# Patient Record
Sex: Female | Born: 1950 | Race: Black or African American | Hispanic: No | Marital: Single | Smoking: Former smoker
Health system: Southern US, Community
[De-identification: ages and names within clinical notes are randomized; demographics above are authoritative.]

## PROBLEM LIST (undated history)

## (undated) DIAGNOSIS — M545 Low back pain, unspecified: Secondary | ICD-10-CM

## (undated) DIAGNOSIS — D259 Leiomyoma of uterus, unspecified: Secondary | ICD-10-CM

## (undated) DIAGNOSIS — C801 Malignant (primary) neoplasm, unspecified: Secondary | ICD-10-CM

## (undated) DIAGNOSIS — K219 Gastro-esophageal reflux disease without esophagitis: Secondary | ICD-10-CM

## (undated) DIAGNOSIS — T7840XA Allergy, unspecified, initial encounter: Secondary | ICD-10-CM

## (undated) DIAGNOSIS — E785 Hyperlipidemia, unspecified: Secondary | ICD-10-CM

## (undated) DIAGNOSIS — K760 Fatty (change of) liver, not elsewhere classified: Secondary | ICD-10-CM

## (undated) DIAGNOSIS — H269 Unspecified cataract: Secondary | ICD-10-CM

## (undated) DIAGNOSIS — K7460 Unspecified cirrhosis of liver: Secondary | ICD-10-CM

## (undated) DIAGNOSIS — M1990 Unspecified osteoarthritis, unspecified site: Secondary | ICD-10-CM

## (undated) DIAGNOSIS — E669 Obesity, unspecified: Secondary | ICD-10-CM

## (undated) DIAGNOSIS — R609 Edema, unspecified: Secondary | ICD-10-CM

## (undated) DIAGNOSIS — H409 Unspecified glaucoma: Secondary | ICD-10-CM

## (undated) DIAGNOSIS — R911 Solitary pulmonary nodule: Secondary | ICD-10-CM

## (undated) DIAGNOSIS — Z889 Allergy status to unspecified drugs, medicaments and biological substances status: Secondary | ICD-10-CM

## (undated) DIAGNOSIS — Z973 Presence of spectacles and contact lenses: Secondary | ICD-10-CM

## (undated) DIAGNOSIS — C229 Malignant neoplasm of liver, not specified as primary or secondary: Secondary | ICD-10-CM

## (undated) DIAGNOSIS — B1920 Unspecified viral hepatitis C without hepatic coma: Secondary | ICD-10-CM

## (undated) DIAGNOSIS — K5790 Diverticulosis of intestine, part unspecified, without perforation or abscess without bleeding: Secondary | ICD-10-CM

## (undated) DIAGNOSIS — R7309 Other abnormal glucose: Secondary | ICD-10-CM

## (undated) DIAGNOSIS — I10 Essential (primary) hypertension: Secondary | ICD-10-CM

## (undated) DIAGNOSIS — D649 Anemia, unspecified: Secondary | ICD-10-CM

## (undated) HISTORY — DX: Unspecified glaucoma: H40.9

## (undated) HISTORY — DX: Unspecified osteoarthritis, unspecified site: M19.90

## (undated) HISTORY — DX: Other abnormal glucose: R73.09

## (undated) HISTORY — DX: Unspecified cataract: H26.9

## (undated) HISTORY — DX: Essential (primary) hypertension: I10

## (undated) HISTORY — DX: Obesity, unspecified: E66.9

## (undated) HISTORY — DX: Allergy, unspecified, initial encounter: T78.40XA

## (undated) HISTORY — PX: JOINT REPLACEMENT: SHX530

## (undated) HISTORY — DX: Unspecified viral hepatitis C without hepatic coma: B19.20

## (undated) HISTORY — PX: CHOLECYSTECTOMY: SHX55

## (undated) HISTORY — DX: Hyperlipidemia, unspecified: E78.5

## (undated) HISTORY — PX: TUBAL LIGATION: SHX77

## (undated) HISTORY — DX: Gastro-esophageal reflux disease without esophagitis: K21.9

## (undated) HISTORY — DX: Low back pain, unspecified: M54.50

## (undated) HISTORY — DX: Low back pain: M54.5

---
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
## 1998-10-17 ENCOUNTER — Ambulatory Visit (HOSPITAL_COMMUNITY): Admission: RE | Admit: 1998-10-17 | Discharge: 1998-10-17 | Payer: Self-pay | Admitting: Gastroenterology

## 1998-10-25 ENCOUNTER — Ambulatory Visit (HOSPITAL_COMMUNITY): Admission: RE | Admit: 1998-10-25 | Discharge: 1998-10-25 | Payer: Self-pay | Admitting: Gastroenterology

## 1998-10-25 ENCOUNTER — Encounter: Payer: Self-pay | Admitting: Gastroenterology

## 1998-11-13 ENCOUNTER — Ambulatory Visit (HOSPITAL_COMMUNITY): Admission: RE | Admit: 1998-11-13 | Discharge: 1998-11-13 | Payer: Self-pay | Admitting: Gastroenterology

## 1998-11-13 ENCOUNTER — Encounter: Payer: Self-pay | Admitting: Gastroenterology

## 2000-09-08 ENCOUNTER — Other Ambulatory Visit: Admission: RE | Admit: 2000-09-08 | Discharge: 2000-09-08 | Payer: Self-pay | Admitting: Internal Medicine

## 2003-09-25 ENCOUNTER — Encounter: Payer: Self-pay | Admitting: Orthopedic Surgery

## 2003-09-27 ENCOUNTER — Inpatient Hospital Stay (HOSPITAL_COMMUNITY): Admission: RE | Admit: 2003-09-27 | Discharge: 2003-09-29 | Payer: Self-pay | Admitting: Orthopedic Surgery

## 2003-10-20 ENCOUNTER — Other Ambulatory Visit: Admission: RE | Admit: 2003-10-20 | Discharge: 2003-10-20 | Payer: Self-pay | Admitting: Internal Medicine

## 2005-03-04 ENCOUNTER — Ambulatory Visit: Payer: Self-pay | Admitting: Internal Medicine

## 2005-03-07 ENCOUNTER — Ambulatory Visit: Payer: Self-pay | Admitting: Internal Medicine

## 2005-03-26 ENCOUNTER — Ambulatory Visit: Payer: Self-pay | Admitting: Internal Medicine

## 2005-12-29 HISTORY — PX: OTHER SURGICAL HISTORY: SHX169

## 2006-02-25 ENCOUNTER — Inpatient Hospital Stay (HOSPITAL_COMMUNITY): Admission: RE | Admit: 2006-02-25 | Discharge: 2006-03-01 | Payer: Self-pay | Admitting: Orthopedic Surgery

## 2006-05-05 ENCOUNTER — Ambulatory Visit: Payer: Self-pay | Admitting: Internal Medicine

## 2006-06-03 ENCOUNTER — Ambulatory Visit: Payer: Self-pay | Admitting: Internal Medicine

## 2007-03-29 ENCOUNTER — Ambulatory Visit: Payer: Self-pay | Admitting: Internal Medicine

## 2007-04-07 ENCOUNTER — Encounter: Admission: RE | Admit: 2007-04-07 | Discharge: 2007-04-07 | Payer: Self-pay | Admitting: Internal Medicine

## 2007-04-29 ENCOUNTER — Ambulatory Visit: Payer: Self-pay | Admitting: Internal Medicine

## 2007-04-29 LAB — CONVERTED CEMR LAB: pH: 6 (ref 5.0–8.0)

## 2007-09-27 ENCOUNTER — Other Ambulatory Visit: Admission: RE | Admit: 2007-09-27 | Discharge: 2007-09-27 | Payer: Self-pay | Admitting: Obstetrics and Gynecology

## 2007-09-28 ENCOUNTER — Encounter: Admission: RE | Admit: 2007-09-28 | Discharge: 2007-09-28 | Payer: Self-pay | Admitting: Obstetrics and Gynecology

## 2008-03-13 ENCOUNTER — Inpatient Hospital Stay (HOSPITAL_COMMUNITY): Admission: RE | Admit: 2008-03-13 | Discharge: 2008-03-17 | Payer: Self-pay | Admitting: Orthopedic Surgery

## 2008-03-13 HISTORY — PX: OTHER SURGICAL HISTORY: SHX169

## 2008-03-30 ENCOUNTER — Telehealth: Payer: Self-pay | Admitting: Internal Medicine

## 2008-03-31 ENCOUNTER — Encounter: Payer: Self-pay | Admitting: Internal Medicine

## 2008-03-31 ENCOUNTER — Ambulatory Visit: Payer: Self-pay | Admitting: Internal Medicine

## 2008-03-31 ENCOUNTER — Ambulatory Visit: Payer: Self-pay

## 2008-03-31 ENCOUNTER — Telehealth: Payer: Self-pay | Admitting: Internal Medicine

## 2008-03-31 DIAGNOSIS — R252 Cramp and spasm: Secondary | ICD-10-CM | POA: Insufficient documentation

## 2008-03-31 DIAGNOSIS — R350 Frequency of micturition: Secondary | ICD-10-CM

## 2008-03-31 DIAGNOSIS — I10 Essential (primary) hypertension: Secondary | ICD-10-CM

## 2008-03-31 LAB — CONVERTED CEMR LAB
Calcium: 9.6 mg/dL (ref 8.4–10.5)
Creatinine, Ser: 0.9 mg/dL (ref 0.4–1.2)
Crystals: NEGATIVE
GFR calc Af Amer: 83 mL/min
GFR calc non Af Amer: 68 mL/min
Magnesium: 2 mg/dL (ref 1.5–2.5)
Nitrite: NEGATIVE
Sodium: 139 meq/L (ref 135–145)
Urobilinogen, UA: 1 (ref 0.0–1.0)

## 2008-04-04 ENCOUNTER — Ambulatory Visit: Payer: Self-pay | Admitting: Internal Medicine

## 2008-04-04 DIAGNOSIS — I8390 Asymptomatic varicose veins of unspecified lower extremity: Secondary | ICD-10-CM

## 2008-04-17 ENCOUNTER — Encounter: Payer: Self-pay | Admitting: Internal Medicine

## 2008-05-04 ENCOUNTER — Ambulatory Visit: Payer: Self-pay | Admitting: Internal Medicine

## 2008-05-04 DIAGNOSIS — R739 Hyperglycemia, unspecified: Secondary | ICD-10-CM

## 2008-05-04 DIAGNOSIS — M25569 Pain in unspecified knee: Secondary | ICD-10-CM

## 2008-07-14 ENCOUNTER — Ambulatory Visit: Payer: Self-pay | Admitting: Internal Medicine

## 2008-07-14 DIAGNOSIS — D485 Neoplasm of uncertain behavior of skin: Secondary | ICD-10-CM

## 2008-09-06 ENCOUNTER — Ambulatory Visit: Payer: Self-pay | Admitting: Internal Medicine

## 2008-09-06 LAB — CONVERTED CEMR LAB
Calcium: 8.9 mg/dL (ref 8.4–10.5)
Creatinine, Ser: 0.8 mg/dL (ref 0.4–1.2)
GFR calc Af Amer: 95 mL/min
Hgb A1c MFr Bld: 5.8 % (ref 4.6–6.0)
Sodium: 144 meq/L (ref 135–145)

## 2008-09-13 ENCOUNTER — Ambulatory Visit: Payer: Self-pay | Admitting: Internal Medicine

## 2008-09-28 ENCOUNTER — Ambulatory Visit: Payer: Self-pay | Admitting: Internal Medicine

## 2008-09-28 DIAGNOSIS — J019 Acute sinusitis, unspecified: Secondary | ICD-10-CM

## 2008-11-14 ENCOUNTER — Ambulatory Visit: Payer: Self-pay | Admitting: Internal Medicine

## 2008-11-14 DIAGNOSIS — K219 Gastro-esophageal reflux disease without esophagitis: Secondary | ICD-10-CM

## 2008-11-14 DIAGNOSIS — M545 Low back pain: Secondary | ICD-10-CM | POA: Insufficient documentation

## 2008-11-15 LAB — CONVERTED CEMR LAB
Bilirubin Urine: NEGATIVE
Hemoglobin, Urine: NEGATIVE
Ketones, ur: NEGATIVE mg/dL
Mucus, UA: NEGATIVE
Nitrite: NEGATIVE

## 2009-03-05 ENCOUNTER — Ambulatory Visit: Payer: Self-pay | Admitting: Internal Medicine

## 2009-03-06 LAB — CONVERTED CEMR LAB
Basophils Absolute: 0 10*3/uL (ref 0.0–0.1)
Basophils Relative: 0 % (ref 0.0–3.0)
Bilirubin Urine: NEGATIVE
CO2: 28 meq/L (ref 19–32)
Calcium: 9.4 mg/dL (ref 8.4–10.5)
Creatinine, Ser: 0.8 mg/dL (ref 0.4–1.2)
Crystals: NEGATIVE
Eosinophils Absolute: 0.1 10*3/uL (ref 0.0–0.7)
GFR calc Af Amer: 94 mL/min
HCT: 38.7 % (ref 36.0–46.0)
Hemoglobin: 12.6 g/dL (ref 12.0–15.0)
Hgb A1c MFr Bld: 5.9 % (ref 4.6–6.0)
Ketones, ur: NEGATIVE mg/dL
MCHC: 32.5 g/dL (ref 30.0–36.0)
MCV: 82.3 fL (ref 78.0–100.0)
Monocytes Absolute: 0.4 10*3/uL (ref 0.1–1.0)
Neutro Abs: 2.7 10*3/uL (ref 1.4–7.7)
RBC: 4.7 M/uL (ref 3.87–5.11)
RDW: 14 % (ref 11.5–14.6)
Sodium: 142 meq/L (ref 135–145)
Specific Gravity, Urine: >=1.03 (ref 1.000–1.035)
Urine Glucose: NEGATIVE mg/dL
pH: 5.5 (ref 5.0–8.0)

## 2009-08-07 ENCOUNTER — Ambulatory Visit: Payer: Self-pay | Admitting: Internal Medicine

## 2009-08-07 DIAGNOSIS — M1990 Unspecified osteoarthritis, unspecified site: Secondary | ICD-10-CM | POA: Insufficient documentation

## 2009-12-29 DIAGNOSIS — E785 Hyperlipidemia, unspecified: Secondary | ICD-10-CM

## 2009-12-29 DIAGNOSIS — D649 Anemia, unspecified: Secondary | ICD-10-CM | POA: Diagnosis present

## 2009-12-29 HISTORY — DX: Hyperlipidemia, unspecified: E78.5

## 2010-09-01 ENCOUNTER — Emergency Department (HOSPITAL_COMMUNITY): Admission: EM | Admit: 2010-09-01 | Discharge: 2010-09-01 | Payer: Self-pay | Admitting: Emergency Medicine

## 2010-09-20 ENCOUNTER — Encounter: Payer: Self-pay | Admitting: Internal Medicine

## 2010-09-20 ENCOUNTER — Ambulatory Visit: Payer: Self-pay | Admitting: Internal Medicine

## 2010-09-20 DIAGNOSIS — B079 Viral wart, unspecified: Secondary | ICD-10-CM | POA: Insufficient documentation

## 2010-09-23 ENCOUNTER — Telehealth: Payer: Self-pay | Admitting: Internal Medicine

## 2010-09-23 DIAGNOSIS — R740 Nonspecific elevation of levels of transaminase and lactic acid dehydrogenase [LDH]: Secondary | ICD-10-CM

## 2010-09-23 LAB — CONVERTED CEMR LAB
Albumin: 3.9 g/dL (ref 3.5–5.2)
BUN: 13 mg/dL (ref 6–23)
Basophils Absolute: 0.1 10*3/uL (ref 0.0–0.1)
Bilirubin Urine: NEGATIVE
CO2: 26 meq/L (ref 19–32)
Calcium: 9.3 mg/dL (ref 8.4–10.5)
Cholesterol: 154 mg/dL (ref 0–200)
Eosinophils Absolute: 0.2 10*3/uL (ref 0.0–0.7)
Glucose, Bld: 90 mg/dL (ref 70–99)
HCT: 39.9 % (ref 36.0–46.0)
HDL: 44.7 mg/dL (ref >39.00)
Hemoglobin: 13.3 g/dL (ref 12.0–15.0)
Ketones, ur: NEGATIVE mg/dL
Lymphocytes Relative: 30.3 % (ref 12.0–46.0)
Lymphs Abs: 1.7 10*3/uL (ref 0.7–4.0)
MCHC: 33.4 g/dL (ref 30.0–36.0)
Neutro Abs: 3.2 10*3/uL (ref 1.4–7.7)
Nitrite: NEGATIVE
Platelets: 172 10*3/uL (ref 150.0–400.0)
RDW: 13.9 % (ref 11.5–14.6)
Sodium: 138 meq/L (ref 135–145)
Total Protein, Urine: NEGATIVE mg/dL
Urine Glucose: NEGATIVE mg/dL
VLDL: 19.4 mg/dL (ref 0.0–40.0)
pH: 6 (ref 5.0–8.0)

## 2010-10-23 ENCOUNTER — Ambulatory Visit: Payer: Self-pay | Admitting: Internal Medicine

## 2010-10-23 DIAGNOSIS — R05 Cough: Secondary | ICD-10-CM

## 2010-10-29 ENCOUNTER — Encounter: Admission: RE | Admit: 2010-10-29 | Discharge: 2010-10-29 | Payer: Self-pay | Admitting: Internal Medicine

## 2011-01-20 ENCOUNTER — Ambulatory Visit
Admission: RE | Admit: 2011-01-20 | Discharge: 2011-01-20 | Payer: Self-pay | Source: Home / Self Care | Attending: Internal Medicine | Admitting: Internal Medicine

## 2011-01-20 ENCOUNTER — Other Ambulatory Visit: Payer: Self-pay | Admitting: Internal Medicine

## 2011-01-20 LAB — HEPATIC FUNCTION PANEL
ALT: 61 U/L — ABNORMAL HIGH (ref 0–35)
AST: 63 U/L — ABNORMAL HIGH (ref 0–37)
Albumin: 3.7 g/dL (ref 3.5–5.2)
Alkaline Phosphatase: 89 U/L (ref 39–117)
Bilirubin, Direct: 0.1 mg/dL (ref 0.0–0.3)
Total Bilirubin: 0.5 mg/dL (ref 0.3–1.2)
Total Protein: 7.3 g/dL (ref 6.0–8.3)

## 2011-01-20 LAB — BASIC METABOLIC PANEL
Calcium: 9.3 mg/dL (ref 8.4–10.5)
GFR: 93.69 mL/min (ref >60.00)
Sodium: 140 mEq/L (ref 135–145)

## 2011-01-21 ENCOUNTER — Ambulatory Visit
Admission: RE | Admit: 2011-01-21 | Discharge: 2011-01-21 | Payer: Self-pay | Source: Home / Self Care | Attending: Internal Medicine | Admitting: Internal Medicine

## 2011-01-28 NOTE — Progress Notes (Signed)
  Phone Note Other Incoming   Summary of Call: per AVP: see append on labs Initial call taken by: Lanier Prude, [facility]),  September 23, 2010 11:59 AM  New Problems: NONSPEC ELEVATION OF LEVELS OF TRANSAMINASE/LDH (ICD-790.4)   New Problems: NONSPEC ELEVATION OF LEVELS OF TRANSAMINASE/LDH (ICD-790.4)

## 2011-01-28 NOTE — Assessment & Plan Note (Signed)
Summary: FU--MED CHANGE---STC   Vital Signs:  Patient profile:   60 year old female Height:      63 inches Weight:      283 pounds BMI:     50.31 Temp:     98.4 degrees F oral Pulse rate:   76 / minute Pulse rhythm:   regular Resp:     16 per minute BP sitting:   118 / 80  (left arm) Cuff size:   large  Vitals Entered By: Lanier Prude, Beverly Gust) (October 23, 2010 4:38 PM) CC: dry cough since starting Amlodipine and Benazepril Is Patient Diabetic? No   CC:  dry cough since starting Amlodipine and Benazepril.  History of Present Illness: C/o cough x weeks, dry F/u elev LFTs  Current Medications (verified): 1)  Lasix 20 Mg  Tabs (Furosemide) .... Take 1 Tablet By Mouth Once A Day 2)  Klor-Con M20 20 Meq  Tbcr (Potassium Chloride Crys Cr) .... Take 1 Tablet By Mouth Once A Day 3)  Vitamin D3 1000 Unit  Tabs (Cholecalciferol) .Marland Kitchen.. 1 By Mouth Daily 4)  Aspirin 81 Mg  Tbec (Aspirin) .... One By Mouth Every Day 5)  Ranitidine Hcl 150 Mg Caps (Ranitidine Hcl) .Marland Kitchen.. 1 Po Two Times A Day Prn 6)  Fluticasone Propionate 50 Mcg/act  Susp (Fluticasone Propionate) .... 2 Sprays Each Nostril Once Daily 7)  Amlodipine Besylate 5 Mg Tabs (Amlodipine Besylate) .Marland Kitchen.. 1 By Mouth Qd 8)  Benazepril Hcl 40 Mg Tabs (Benazepril Hcl) .Marland Kitchen.. 1 By Mouth Once Daily  Allergies (verified): 1)  Benazepril Hcl (Benazepril Hcl)  Past History:  Past Medical History: Hepatitis C Hypertension Elev glu Low back pain Obesity GERD Osteoarthritis B knees Elev LFTs 2011  Review of Systems  The patient denies fever.    Physical Exam  General:  overweight-appearing.   Mouth:  Oral mucosa and oropharynx without lesions or exudates.  Teeth in good repair. Lungs:  Normal respiratory effort, chest expands symmetrically. Lungs are clear to auscultation, no crackles or wheezes. Heart:  Normal rate and regular rhythm. S1 and S2 normal without gallop, murmur, click, rub or other extra sounds. Abdomen:  Bowel  sounds positive,abdomen soft and non-tender without masses, organomegaly or hernias noted. Extremities:  No clubbing, cyanosis, edema, or deformity noted with normal full range of motion of all joints.     Impression & Recommendations:  Problem # 1:  COUGH (ICD-786.2) due to ACE inh Assessment New D/c Benazepril  Problem # 2:  HYPERTENSION (ICD-401.9) Assessment: Improved  The following medications were removed from the medication list:    Benazepril Hcl 40 Mg Tabs (Benazepril hcl) .Marland Kitchen... 1 by mouth once daily Her updated medication list for this problem includes:    Lasix 20 Mg Tabs (Furosemide) .Marland Kitchen... Take 1 tablet by mouth once a day    Amlodipine Besylate 5 Mg Tabs (Amlodipine besylate) .Marland Kitchen... 1 by mouth qd    Cozaar 100 Mg Tabs (Losartan potassium) .Marland Kitchen... 1 by mouth qd  Problem # 3:  NONSPEC ELEVATION OF LEVELS OF TRANSAMINASE/LDH (ICD-790.4) Assessment: New Poss fatty liver Orders: Radiology Referral (Radiology)  Complete Medication List: 1)  Lasix 20 Mg Tabs (Furosemide) .... Take 1 tablet by mouth once a day 2)  Klor-con M20 20 Meq Tbcr (Potassium chloride crys cr) .... Take 1 tablet by mouth once a day 3)  Vitamin D3 1000 Unit Tabs (Cholecalciferol) .Marland Kitchen.. 1 by mouth daily 4)  Aspirin 81 Mg Tbec (Aspirin) .... One by mouth every day 5)  Ranitidine Hcl 150 Mg Caps (Ranitidine hcl) .Marland Kitchen.. 1 po two times a day prn 6)  Fluticasone Propionate 50 Mcg/act Susp (Fluticasone propionate) .... 2 sprays each nostril once daily 7)  Amlodipine Besylate 5 Mg Tabs (Amlodipine besylate) .Marland Kitchen.. 1 by mouth qd 8)  Cozaar 100 Mg Tabs (Losartan potassium) .Marland Kitchen.. 1 by mouth qd  Patient Instructions: 1)  Please schedule a follow-up appointment in 3 months. 2)  BMP prior to visit, ICD-9: 3)  Hepatic Panel prior to visit, ICD-9:401.1 995.20 Prescriptions: COZAAR 100 MG TABS (LOSARTAN POTASSIUM) 1 by mouth qd  #30 x 11   Entered and Authorized by:   Tresa Garter MD   Signed by:   Tresa Garter MD on 10/23/2010   Method used:   Print then Give to Patient   RxID:   [id number]    Orders Added: 1)  Radiology Referral [Radiology] 2)  Est. Patient Level IV [14782]   Immunization History:  Influenza Immunization History:    Influenza:  historical (08/31/2010)   Immunization History:  Influenza Immunization History:    Influenza:  Historical (08/31/2010)

## 2011-01-28 NOTE — Assessment & Plan Note (Signed)
Summary: yearly f/u / medicare/cd   Vital Signs:  Patient profile:   60 year old female Height:      63 inches Weight:      283 pounds BMI:     50.31 Temp:     99.2 degrees F oral Pulse rate:   76 / minute Pulse rhythm:   regular Resp:     16 per minute BP sitting:   118 / 78  (left arm) Cuff size:   large  Vitals Entered By: Lanier Prude, Beverly Gust) (September 20, 2010 9:42 AM) CC: MWV Is Patient Diabetic? No   CC:  MWV.  History of Present Illness: The patient presents for a preventive health examination  Patient past medical history, social history, and family history reviewed in detail no significant changes.  Patient is physically active. Depression is negative and mood is good. Hearing is normal, and able to perform activities of daily living. Risk of falling is negligible and home safety has been reviewed and is appropriate. Patient has normal height, overweight, and visual acuity. Patient has been counseled on age-appropriate routine health concerns for screening and prevention. Education, counseling done.  C/o skin growth C/o wt gain,  F/u HTN - Rx too $$$  Preventive Screening-Counseling & Management  Alcohol-Tobacco     Smoking Status: quit  Caffeine-Diet-Exercise     Does Patient Exercise: no  Current Medications (verified): 1)  Lasix 20 Mg  Tabs (Furosemide) .... Take 1 Tablet By Mouth Once A Day 2)  Klor-Con M20 20 Meq  Tbcr (Potassium Chloride Crys Cr) .... Take 1 Tablet By Mouth Once A Day 3)  Vitamin D3 1000 Unit  Tabs (Cholecalciferol) .Marland Kitchen.. 1 By Mouth Daily 4)  Azor 5-40 Mg  Tabs (Amlodipine-Olmesartan) .Marland Kitchen.. 1 By Mouth Daily 5)  Aspirin 81 Mg  Tbec (Aspirin) .... One By Mouth Every Day 6)  Ranitidine Hcl 150 Mg Caps (Ranitidine Hcl) .Marland Kitchen.. 1 Po Two Times A Day Prn 7)  Fluticasone Propionate 50 Mcg/act  Susp (Fluticasone Propionate) .... 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 Single Occupation: CNA on  a disability Former Smoker 2009 Alcohol use-no Regular exercise-no Smoking Status:  quit Does Patient Exercise:  no  Review of Systems  The patient denies anorexia, fever, weight loss, weight gain, vision loss, decreased hearing, hoarseness, chest pain, syncope, dyspnea on exertion, peripheral edema, prolonged cough, headaches, hemoptysis, abdominal pain, melena, hematochezia, severe indigestion/heartburn, hematuria, incontinence, genital sores, muscle weakness, suspicious skin lesions, transient blindness, difficulty walking, depression, unusual weight change, abnormal bleeding, enlarged lymph nodes, angioedema, and breast masses.    Physical Exam  General:  overweight-appearing.   Head:  Normocephalic and atraumatic without obvious abnormalities. No apparent alopecia or balding. Eyes:  No corneal or conjunctival inflammation noted. EOMI. Perrla.  Ears:  External ear exam shows no significant lesions or deformities.  Otoscopic examination reveals clear canals, tympanic membranes are intact bilaterally without bulging, retraction, inflammation or discharge. Hearing is grossly normal bilaterally. Nose:  External nasal examination shows no deformity or inflammation. Nasal mucosa are pink and moist without lesions or exudates. Mouth:  Oral mucosa and oropharynx without lesions or exudates.  Teeth in good repair. Neck:  No deformities, masses, or tenderness noted. Lungs:  Normal respiratory effort, chest expands symmetrically. Lungs are clear  to auscultation, no crackles or wheezes. Heart:  Normal rate and regular rhythm. S1 and S2 normal without gallop, murmur, click, rub or other extra sounds. Abdomen:  Bowel sounds positive,abdomen soft and non-tender without masses, organomegaly or hernias noted. Msk:  No deformity or scoliosis noted of thoracic or lumbar spine.   Extremities:  No clubbing, cyanosis, edema, or deformity noted with normal full range of motion of all joints.   Neurologic:  No cranial nerve deficits noted. Station and gait are normal. Plantar reflexes are down-going bilaterally. DTRs are symmetrical throughout. Sensory, motor and coordinative functions appear intact. Skin:  warts on skin Cervical Nodes:  No lymphadenopathy noted Inguinal Nodes:  No significant adenopathy Psych:  Cognition and judgment appear intact. Alert and cooperative with normal attention span and concentration. No apparent delusions, illusions, hallucinations   Impression & Recommendations:  Problem # 1:  HEALTH MAINTENANCE EXAM (ICD-V70.0) Assessment New Overall doing well, age appropriate education and counseling updated and referral for appropriate preventive services done unless declined, immunizations up to date or declined, diet counseling done if overweight, urged to quit smoking if smokes, most recent labs reviewed and current ordered if appropriate, ecg reviewed or declined (interpretation per ECG scanned in the EMR if done); information regarding Medicare Preventation requirements given if appropriate.  Gyn appt/mammo is pending   Orders: TLB-BMP (Basic Metabolic Panel-BMET) (80048-METABOL) TLB-CBC Platelet - w/Differential (85025-CBCD) TLB-Hepatic/Liver Function Pnl (80076-HEPATIC) TLB-Lipid Panel (80061-LIPID) TLB-TSH (Thyroid Stimulating Hormone) (84443-TSH) TLB-Udip ONLY (81003-UDIP) EKG w/ Interpretation (93000) Medicare -1st Annual Wellness Visit [phone])  Problem # 2:  WART, VIRAL (ICD-078.10) face Assessment:  New Procedure: cryo Indication: wart(s) Risks incl. scar(s), incomplete removal, ect.  and benefits discussed     5 lesion(s) on skin was/were treated with liqid N2 in usual fasion.  Tolerated well. Compl. none. Wound care instructions given.   Problem # 3:  LOW BACK PAIN (ICD-724.2) Assessment: Unchanged  Her updated medication list for this problem includes:    Aspirin 81 Mg Tbec (Aspirin) ..... One by mouth every day  Complete Medication List: 1)  Lasix 20 Mg Tabs (Furosemide) .... Take 1 tablet by mouth once a day 2)  Klor-con M20 20 Meq Tbcr (Potassium chloride crys cr) .... Take 1 tablet by mouth once a day 3)  Vitamin D3 1000 Unit Tabs (Cholecalciferol) .Marland Kitchen.. 1 by mouth daily 4)  Aspirin 81 Mg Tbec (Aspirin) .... One by mouth every day 5)  Ranitidine Hcl 150 Mg Caps (Ranitidine hcl) .Marland Kitchen.. 1 po two times a day prn 6)  Fluticasone Propionate 50 Mcg/act Susp (Fluticasone propionate) .... 2 sprays each nostril once daily 7)  Amlodipine Besylate 5 Mg Tabs (Amlodipine besylate) .Marland Kitchen.. 1 by mouth qd 8)  Benazepril Hcl 40 Mg Tabs (Benazepril hcl) .Marland Kitchen.. 1 by mouth once daily  Other Orders: Wart Destruct <14 (17110)  Patient Instructions: 1)  Please schedule a follow-up appointment in 4 months. Prescriptions: FLUTICASONE PROPIONATE 50 MCG/ACT  SUSP (FLUTICASONE PROPIONATE) 2 sprays each nostril once daily  #1 vial x 3   Entered and Authorized by:   Tresa Garter MD   Signed by:   Tresa Garter MD on 09/20/2010   Method used:   Print then Give to Patient   RxID:   [id number] RANITIDINE HCL 150 MG CAPS (RANITIDINE HCL) 1  po two times a day prn  #60 x 12   Entered and Authorized by:   Tresa Garter MD   Signed by:   Tresa Garter MD on 09/20/2010   Method used:   Print then Give to Patient   RxID:   [id number] VITAMIN D3 1000 UNIT  TABS (CHOLECALCIFEROL) 1 by mouth daily  #100 x 3   Entered and Authorized by:   Tresa Garter MD   Signed by:    Tresa Garter MD on 09/20/2010   Method used:   Print then Give to Patient   RxID:   [id number] KLOR-CON M20 20 MEQ  TBCR (POTASSIUM CHLORIDE CRYS CR) Take 1 tablet by mouth once a day  #30 Each x 12   Entered and Authorized by:   Tresa Garter MD   Signed by:   Tresa Garter MD on 09/20/2010   Method used:   Print then Give to Patient   RxID:   [id number] LASIX 20 MG  TABS (FUROSEMIDE) Take 1 tablet by mouth once a day  #30 Each x 12   Entered and Authorized by:   Tresa Garter MD   Signed by:   Tresa Garter MD on 09/20/2010   Method used:   Print then Give to Patient   RxID:   [id number] BENAZEPRIL HCL 40 MG TABS (BENAZEPRIL HCL) 1 by mouth once daily  #30 x 12   Entered and Authorized by:   Tresa Garter MD   Signed by:   Tresa Garter MD on 09/20/2010   Method used:   Print then Give to Patient   RxID:   [id number] AMLODIPINE BESYLATE 5 MG TABS (AMLODIPINE BESYLATE) 1 by mouth qd  #30 x 12   Entered and Authorized by:   Tresa Garter MD   Signed by:   Tresa Garter MD on 09/20/2010   Method used:   Print then Give to Patient   RxID:   [id number]

## 2011-01-30 NOTE — Assessment & Plan Note (Signed)
Summary: 4 MO ROV /NWS  #   Vital Signs:  Patient profile:   60 year old female Height:      63 inches Weight:      282 pounds BMI:     50.13 Temp:     98.6 degrees F oral Pulse rate:   84 / minute Pulse rhythm:   regular Resp:     16 per minute BP sitting:   120 / 86  (left arm) Cuff size:   large  Vitals Entered By: Lanier Prude, CMA(AAMA) (January 21, 2011 8:10 AM) CC: 4 mo f/u  Is Patient Diabetic? No   CC:  4 mo f/u .  History of Present Illness: F/u HTN, cough, elev. LFTs  Current Medications (verified): 1)  Lasix 20 Mg  Tabs (Furosemide) .... Take 1 Tablet By Mouth Once A Day 2)  Klor-Con M20 20 Meq  Tbcr (Potassium Chloride Crys Cr) .... Take 1 Tablet By Mouth Once A Day 3)  Vitamin D3 1000 Unit  Tabs (Cholecalciferol) .Marland Kitchen.. 1 By Mouth Daily 4)  Aspirin 81 Mg  Tbec (Aspirin) .... One By Mouth Every Day 5)  Ranitidine Hcl 150 Mg Caps (Ranitidine Hcl) .Marland Kitchen.. 1 Po Two Times A Day Prn 6)  Fluticasone Propionate 50 Mcg/act  Susp (Fluticasone Propionate) .... 2 Sprays Each Nostril Once Daily 7)  Amlodipine Besylate 5 Mg Tabs (Amlodipine Besylate) .Marland Kitchen.. 1 By Mouth Qd 8)  Cozaar 100 Mg Tabs (Losartan Potassium) .Marland Kitchen.. 1 By Mouth Qd  Allergies (verified): 1)  Benazepril Hcl (Benazepril Hcl)  Past History:  Past Medical History: Last updated: 10/23/2010 Hepatitis C Hypertension Elev glu Low back pain Obesity GERD Osteoarthritis B knees Elev LFTs 2011  Social History: Last updated: 09/20/2010 Single Occupation: CNA on  a disability Former Smoker 2009 Alcohol use-no Regular exercise-no  Review of Systems  The patient denies fever, weight loss, weight gain, chest pain, syncope, and abdominal pain.    Physical Exam  General:  overweight-appearing.   Head:  Normocephalic and atraumatic without obvious abnormalities. No apparent alopecia or balding. Nose:  External nasal examination shows no deformity or inflammation. Nasal mucosa are pink and moist without  lesions or exudates. Mouth:  Oral mucosa and oropharynx without lesions or exudates.  Teeth in good repair. Neck:  No deformities, masses, or tenderness noted. Lungs:  Normal respiratory effort, chest expands symmetrically. Lungs are clear to auscultation, no crackles or wheezes. Heart:  Normal rate and regular rhythm. S1 and S2 normal without gallop, murmur, click, rub or other extra sounds. Abdomen:  Bowel sounds positive,abdomen soft and non-tender without masses, organomegaly or hernias noted. Msk:  No deformity or scoliosis noted of thoracic or lumbar spine.   Extremities:  No clubbing, cyanosis, edema, or deformity noted with normal full range of motion of all joints.   Neurologic:  No cranial nerve deficits noted. Station and gait are normal. Plantar reflexes are down-going bilaterally. DTRs are symmetrical throughout. Sensory, motor and coordinative functions appear intact. Skin:  warts on skin Psych:  Cognition and judgment appear intact. Alert and cooperative with normal attention span and concentration. No apparent delusions, illusions, hallucinations   Impression & Recommendations:  Problem # 1:  OSTEOARTHRITIS (ICD-715.90) Assessment Unchanged  Her updated medication list for this problem includes:    Aspirin 81 Mg Tbec (Aspirin) ..... One by mouth every day  Problem # 2:  NONSPEC ELEVATION OF LEVELS OF TRANSAMINASE/LDH (ICD-790.4) Assessment: Unchanged Fatty liver. Discussed The labs/US were reviewed with the patient.  Problem # 3:  GERD (ICD-530.81) Assessment: Unchanged  Her updated medication list for this problem includes:    Ranitidine Hcl 150 Mg Caps (Ranitidine hcl) .Marland Kitchen... 1 po two times a day prn  Problem # 4:  COUGH (ICD-786.2) resolved Assessment: Improved  Problem # 5:  OBESITY, MORBID (ICD-278.01) Assessment: Unchanged See "Patient Instructions".   Complete Medication List: 1)  Lasix 20 Mg Tabs (Furosemide) .... Take 1 tablet by mouth once a day 2)   Klor-con M20 20 Meq Tbcr (Potassium chloride crys cr) .... Take 1 tablet by mouth once a day 3)  Vitamin D3 1000 Unit Tabs (Cholecalciferol) .Marland Kitchen.. 1 by mouth daily 4)  Aspirin 81 Mg Tbec (Aspirin) .... One by mouth every day 5)  Ranitidine Hcl 150 Mg Caps (Ranitidine hcl) .Marland Kitchen.. 1 po two times a day prn 6)  Fluticasone Propionate 50 Mcg/act Susp (Fluticasone propionate) .... 2 sprays each nostril once daily 7)  Amlodipine Besylate 5 Mg Tabs (Amlodipine besylate) .Marland Kitchen.. 1 by mouth qd 8)  Cozaar 100 Mg Tabs (Losartan potassium) .Marland Kitchen.. 1 by mouth qd  Patient Instructions: 1)  Please schedule a follow-up appointment in 4 months well w/labs. 2)  It is important that you exercise regularly at least 20 minutes 5 times a week. If you develop chest pain, have severe difficulty breathing, or feel very tired , stop exercising immediately and seek medical attention. 3)  You need to lose weight. Consider a lower calorie diet and regular exercise.    Orders Added: 1)  Est. Patient Level IV [16109]

## 2011-05-13 NOTE — Op Note (Signed)
Samantha [name], Samantha [name]              ACCOUNT NO.:  [id number]   MEDICAL RECORD NO.:  [id number]          PATIENT TYPE:  INP   LOCATION:  2899                         FACILITY:  [facility]   PHYSICIAN:  Feliberto Gottron. Turner Daniels, M.D.   DATE OF BIRTH:  [birth year]   DATE OF PROCEDURE:  03/13/2008  DATE OF DISCHARGE:                               OPERATIVE REPORT   PREOPERATIVE DIAGNOSIS:  End-stage arthritis, left knee.   POSTOPERATIVE DIAGNOSIS:  End-stage arthritis, left knee.   PROCEDURE:  Left total knee arthroplasty using DePuy Sigma RP  components, 3 femur, 4 tibia, 35-mm patellar button and a 10=mm  posterior-stabilized rotating platform bearing.  All components cemented  with a double batch of DePuy HV cement with 1500 mg of Zinacef.   SURGEON:  Feliberto Gottron.  Turner Daniels, MD   FIRST ASSISTANT:  Laural Benes. Jannet Mantis., PA-C.   ANESTHETIC:  General endotracheal.   ESTIMATED BLOOD LOSS:  Minimal.   FLUID REPLACEMENT:  1500 mL crystalloid.   TOURNIQUET TIME:  1 hour 30 minutes.   DRAINS PLACED:  Two medium Hemovacs and a Foley catheter.   URINE OUTPUT:  300 mL.   INDICATIONS FOR PROCEDURE:  A 60 year old woman status post successful  right total knee in 2007 for end-stage arthritis now has same on the  left and has failed conservative treatment.  Desires elective left total  knee arthroplasty.  Well aware of the risks and benefits of surgery,  questions answered.   DESCRIPTION OF PROCEDURE:  The patient underwent left femoral nerve  block anesthetic in the block area at [facility], then taken to  operating room #15, where the appropriate anesthetic monitors were  attached and general endotracheal anesthesia induced.  She received 2 g  of Ancef preoperatively IV, lateral post and foot positioner applied to  the left side of the table, tourniquet applied high the left thigh,  Foley catheter was inserted and the left lower extremity prepped and  draped in the usual sterile fashion from the  ankle to the tourniquet.  The limb was wrapped with an Esmarch bandage, tourniquet inflated to 350  mmHg.  Time-out procedure was performed and we began the procedure by  making an anterior midline incision starting one handbreadth above the  patella, going over the patella 1 cm medial to and 2 cm distal to the  tibial tubercle.  Small bleeders in the skin and subcutaneous tissue  identified and cauterized.  Transverse retinaculum over the patella was  sectioned and reflected medially, allowing a medial parapatellar  arthrotomy.  Prepatellar fat pad was resected, the patella everted.  Superficial medial collateral ligament was elevated off the proximal  tibia from anterior to posterior, leaving it intact distally about 6 cm  down the metaphysis.  This allowed [place] to evert the patella, hyperflex  and externally rotate the tibia, exposing the cruciate ligaments that  were resected with the electrocautery.  Peripheral notch osteophytes  were removed with a half-inch osteotome.  A lateral Hohmann retractor  was then placed, a McCale retractor through the notch and a  posteromedial Z retractor.  We  then entered the proximal tibia with the  DePuy step drill just lateral to the center of the tibia about 1-2 mm  because of the configuration of her tibial shaft.  The 0-degree  posterior slope cutting guide was pinned into place, allowing [place] to  resect 7 mm of bone medially and 10 mm of bone laterally and some more  peripheral osteophytes.  Satisfied with the tibial resection, we then  entered the distal femur 2 mm anterior to the PCL origin, followed by  the IM rod and a 5-degrees left distal femoral cutting guide set at 11  mm.  The distal femoral cut was then accomplished and we measured for a  #3 tibia, pinning the guide in 3 degrees of external rotation and using  the posterior referenced guide.  With the chamfer guide hammered into  place and clipped, we then performed our anterior-posterior  and chamfer  cuts without difficulty, followed by the sigma RP box cut.  We checked  our extension and flexion gaps with the lollipop and found them to be  excellent.  The patella was then measured at 24 mm.  We guessed at a 35-  mm patellar button, set the cutting guide at 16 and resected the  posterior 8-9 mm of the patella, sized for a 35 button and drilled the  patella.  The knee was once again hyperflexed exposing the proximal  tibia, which was then sized for a #4 tibial baseplate, which was pinned  into place followed by the smokestack and the smokestack reamer.  The  delta fin keel cuts were then performed with the power saw and the delta  fin keel and bolt were hammered into place.  The 4 left femoral trial  was then hammered into place and the lugs drilled.  A 10-mm sigma RP  trial spacer was placed on the trials and a 35 trial button placed.  The  knee was then reduced, taken to full extension and then flexed to 130  degrees, limited by adipose tissue.  Satisfied with the ligamentous  stability, the trial components were then removed and all bony surfaces  were water-picked clean and dried with suction and sponges.  A double  batch of DePuy HV cement with 1500 mg of Zinacef was then mixed and  applied to all bony and metallic mating surfaces except for the  posterior condyles of the femur itself.  In order, we then hammered into  place a #4 tibial baseplate and removed excess cement, a #3 left femoral  component and removed excess cement, and a 35-mm patellar button was  squeezed into place and excess cement removed.  A 10-mm sigma RP bearing  was then placed in the proximal tibia and the knee reduced.  At this  point the knee was once again water-picked clean and the cement allowed  to harden.  Medium Hemovac drains were placed in the wound.  We checked  stability one more time and it was found to be excellent.  At this point  the parapatellar arthrotomy was closed with running  #1 Vicryl suture,  the subcutaneous tissue with 0 and 2-0 undyed Vicryl suture, and the  skin with skin staples.  A dressing of Xeroform, 4x4 dressing sponges,  Webril and Ace wrap applied.  Tourniquet let down.  The patient awakened  and taken to the recovery room without difficulty.      Feliberto Gottron. Turner Daniels, M.D.  Electronically Signed     FJR/MEDQ  D:  03/13/2008  T:  03/13/2008  Job:  [id number]

## 2011-05-15 ENCOUNTER — Other Ambulatory Visit: Payer: Self-pay

## 2011-05-15 ENCOUNTER — Other Ambulatory Visit: Payer: Self-pay | Admitting: Internal Medicine

## 2011-05-15 DIAGNOSIS — Z0000 Encounter for general adult medical examination without abnormal findings: Secondary | ICD-10-CM

## 2011-05-16 ENCOUNTER — Other Ambulatory Visit (INDEPENDENT_AMBULATORY_CARE_PROVIDER_SITE_OTHER): Payer: Self-pay | Admitting: Internal Medicine

## 2011-05-16 ENCOUNTER — Other Ambulatory Visit (INDEPENDENT_AMBULATORY_CARE_PROVIDER_SITE_OTHER): Payer: Self-pay

## 2011-05-16 DIAGNOSIS — Z0000 Encounter for general adult medical examination without abnormal findings: Secondary | ICD-10-CM

## 2011-05-16 LAB — LIPID PANEL
HDL: 53.2 mg/dL (ref >39.00)
Total CHOL/HDL Ratio: 3
Triglycerides: 70 mg/dL (ref 0.0–149.0)

## 2011-05-16 LAB — CBC WITH DIFFERENTIAL/PLATELET
Basophils Relative: 0.6 % (ref 0.0–3.0)
Eosinophils Absolute: 0.1 10*3/uL (ref 0.0–0.7)
Eosinophils Relative: 2.4 % (ref 0.0–5.0)
HCT: 39 % (ref 36.0–46.0)
Lymphs Abs: 1.7 10*3/uL (ref 0.7–4.0)
MCHC: 33.7 g/dL (ref 30.0–36.0)
MCV: 83.3 fl (ref 78.0–100.0)
Monocytes Absolute: 0.6 10*3/uL (ref 0.1–1.0)
Neutrophils Relative %: 52.9 % (ref 43.0–77.0)
RBC: 4.68 Mil/uL (ref 3.87–5.11)
WBC: 5.2 10*3/uL (ref 4.5–10.5)

## 2011-05-16 LAB — BASIC METABOLIC PANEL
CO2: 28 mEq/L (ref 19–32)
Calcium: 9.4 mg/dL (ref 8.4–10.5)
Chloride: 109 mEq/L (ref 96–112)
Potassium: 4.5 mEq/L (ref 3.5–5.1)
Sodium: 141 mEq/L (ref 135–145)

## 2011-05-16 LAB — URINALYSIS, ROUTINE W REFLEX MICROSCOPIC
Bilirubin Urine: NEGATIVE
Ketones, ur: NEGATIVE
Total Protein, Urine: NEGATIVE
Urine Glucose: NEGATIVE
Urobilinogen, UA: 0.2 (ref 0.0–1.0)

## 2011-05-16 LAB — HEPATIC FUNCTION PANEL
ALT: 89 U/L — ABNORMAL HIGH (ref 0–35)
Total Protein: 7.2 g/dL (ref 6.0–8.3)

## 2011-05-16 NOTE — Op Note (Signed)
NAME:  Samantha [name], Samantha [name]                        ACCOUNT NO.:  [id number]   MEDICAL RECORD NO.:  [id number]                   PATIENT TYPE:  INP   LOCATION:  5031                                 FACILITY:  [facility]   PHYSICIAN:  Feliberto Gottron. Turner Daniels, M.D.                DATE OF BIRTH:  1951-08-16   DATE OF PROCEDURE:  09/27/2003  DATE OF DISCHARGE:                                 OPERATIVE REPORT   PREOPERATIVE DIAGNOSIS:  Osteoarthritis of the left shoulder.   POSTOPERATIVE DIAGNOSIS:  Osteoarthritis of the left shoulder.   PROCEDURE:  Left shoulder hemiarthroplasty in a DuPuy global shoulder  system, a #8 stem and a 52 mm x 15 mm head.   SURGEON:  Feliberto Gottron. Turner Daniels, M.D.   FIRST ASSISTANT:  Erskine Squibb B. Jannet Mantis.   ANESTHESIA:  General endotracheal.   ESTIMATED BLOOD LOSS:  300 cc.   FLUID REPLACEMENT:  1500 cc Crystalloid.   DRAINS:  None.   TOURNIQUET TIME:  None.   INDICATIONS FOR PROCEDURE:  A 60 year old woman with end-stage arthritis of  both shoulders, left worse than right.  She has failed conservative  treatment of the left shoulder with anti-inflammatory medications, cortisone  injections, arthroscopic washout; has a large drop osteophyte and still has  very significant pain in her left shoulder.  Desires elective either total  shoulder arthroplasty or hemiarthroplasty, depending on the findings at  surgery.  A preoperative MRI scan was consistent with osteoarthritis and  partial tearing of the rotator cuff.   DESCRIPTION OF PROCEDURE:  The patient identified by armband, taken to the  OR at [facility]  The appropriate anesthetic  monitors were attached and general endotracheal anesthesia induced with the  patient in the supine position.  She was then placed in the  semi-Fowler beach chair position, and the left upper extremity prepped and  draped in the usual sterile fashion from the wrist to the hemithorax.   We began the procedure by making a  deltopectoral incision, starting a few  centimeters above the coracoid process and going distally down towards the  insertion of the deltoid muscle, for a length of about 15 cm.  Small  bleeders in the skin and subcutaneous tissue were identified and cauterized.  We identified the cephalic vein and retracted it laterally to exposing the  deltopectoral interval; which was then bluntly dissected down to the  deltopectoral fascia.  This was then incised along the line of the conjoined  tendon and the subscapularis, allowing placement of the Link  retractor in  the interval between the conjoined tendon and the subscapularis and the  deltoid muscle.   The patient had good external rotation to about 60 degrees.  Therefore, we  took down the subscapularis and capsule as one unit, and tagged it with four  #2 Ethibond sutures.  This exposed the arthritic humeral head, and after we  had  dissected the capsule down to the six o'clock postion we were able to  externally rotate the humeral head and dislocate it from the joint,  delivering it from the joint with the combination of the Link retractor and  a couple of Homan retractors.  We then found a point that was coaxial with  the humeral shaft, on the superior aspect of the junction of the humeral  head and the shaft, and entered this with the hand reamer from the DuPuy  global shoulder set.  We hand reamed at 6 mm and then hand reamed at 8 mm --  obtaining a good fit and fill at the 8 mm level.  The reamer was left in the  bone, and the proximally humeral head cutting guide was brought up and set  at 20 degrees of anteversion.  Then we resected a thickness of about 15-16  mm of the humeral head, after first removing the huge peripheral osteophytes  anterior, inferior and posterior.  We then sized to a 52 x 15 modular  humeral head, and brought up the body cutting chisel, which was hammered  into place and allowing removal of metaphyseal bone and  placement of the fin  cuts.   An A trial body was then hammered into place, with a 15 x 52 head and the  shoulder reduced.  Excellent stability noted to at least 60 of external  rotation and internally it could not be rotated and dislocated.  The glenoid  was carefully evaluated and found to have articular cartilage throughout,  although it had grade 2 chondromalacia and the labrum was intact.   At this point the trial components were removed.  A #8 body and stem was  then hammered into place, obtaining a good press fit.  A 15 x 52 modular  head was then hammered onto the stem; the shoulder once again reduced after  irrigating with normal saline solution.  Prior to placement of the body,  four drill holes with loops were placed in the anterior and inferior aspects  of the proximal humerus -- to allow repair of the subscapularis and capsule  back to bone.  This was accomplished using the #2 Ethibond sutures, and the  rotator interval was also closed with #2 Ethibond suture.   The shoulder was then taken through a range of motion and found to be very  stable.  We once again palpated the axillary nerve and confirmed that it was  intact.  No significant bleeding was encountered.  The wound was washed out  with normal saline solution.  We then closed the subcutaneous layer with #1  Vicryl suture, 2-0 Vicryl suture, and the skin was closed with running,  interlocking 3-0 nylon suture.  A dressing of Xeroform 4 x 4 dressing  sponges and Hypafix tape applied.  The patient was then placed in a sling,  awakened and taken to the recovery room without difficulty.                                               Feliberto Gottron. Turner Daniels, M.D.    Ovid Curd  D:  09/27/2003  T:  09/27/2003  Job:  [id number]

## 2011-05-16 NOTE — Discharge Summary (Signed)
NAMEHENNESSEY, CANTRELL              ACCOUNT NO.:  [id number]   MEDICAL RECORD NO.:  [id number]          PATIENT TYPE:  INP   LOCATION:  5020                         FACILITY:  [facility]   PHYSICIAN:  Feliberto Gottron. Turner Daniels, M.D.   DATE OF BIRTH:  1951/03/14   DATE OF ADMISSION:  03/13/2008  DATE OF DISCHARGE:  03/17/2008                               DISCHARGE SUMMARY   DIAGNOSIS:  End-stage degenerative joint disease of the left knee.   DISCHARGE SUMMARY:  The patient is a 60 year old woman who underwent  successful right total knee in 2007 for end-stage arthritis, now has the  same signs and symptoms on the left, has failed conservative treatment.  She desires elective left total knee arthroplasty.  She is well aware of  the risks and benefits, and wishes to proceed.   ALLERGIES:  She has no known drug allergies.   MEDICATIONS:  At the time of admission include Benicar, Lasix, EpiCor,  Klor-Con, and Vicodin.   PAST MEDICAL HISTORY:  __________ , history of hypertension, and DJD.   SURGICAL HISTORY:  Left shoulder replacement in 2006, right total knee  in 2007.   SOCIAL HISTORY:  No tobacco, positive ethanol, no IV drug abuse.  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   FAMILY HISTORY:  Mother died at age 14 with a history of CVA, CAD,  hypertension, and cancer.  Father died at the age of 73 with history of  diabetes, heart attack, and hypertension.   REVIEW OF SYSTEMS:  Positive for upper and lower partial dentures.  She  denies any shortness of breath, chest pain, or recent illness.   PHYSICAL EXAMINATION:  VITAL SIGNS:  Temperature 97.8, pulse 77,  respirations 16, blood pressure 157/101 and also re-examined 127/86.  The patient is a 5 feet 2 inches, 220-pound female.  HEAD:  Normocephalic, atraumatic.  EARS:  TMs are clear.  EYES:  Pupils are equal, round, and reactive to light and accommodation.  NOSE:  Patent.  THROAT:  Midline.  NECK:  Supple with full range of motion.  CHEST:  Clear to percussion and auscultation.  HEART:  Regular rate and rhythm.  ABDOMEN:  Soft and nontender.  Bowel sounds 2+.  EXTREMITIES:  Right knee range of motion 0-100 degrees.  Well-healed  __________  scar.  Left knee range of motion 5-100 degrees with 5  degrees of varus deformity.  Positive for crepitus with pain.  SKIN:  Within normal limits.   LABORATORY DATA:  X-rays show bone-on-bone changes of the left knee.  Right knee shows stable components.  Preoperative labs including CBC,  CMET, chest x-ray, EKG, PT, and PTT were all within normal limits  with  the exception of an AST of 75 and ALT of 70, as well as calcium of 8.3.   HOSPITAL COURSE:  On the day of admission, the patient was taken to the  operating room at [facility] where she underwent left total knee  arthroplasty using DePuy Sigma RP components, #3 femur, #4 tibia, 35 mm  patellar button, a 10 mm posterior stabilized rotating platform bearing  with all components cemented with double batch of DePuy HV cement using  1500 mg of Zinacef.  The patient was placed on perioperative  antibiotics.  She was placed on postoperative Coumadin prophylaxis with  bridging Lovenox therapy until she became therapeutic.  She was placed  on a PCA Dilaudid pump for pain control.  Physical therapy was begun in  the recovery area using a CPM.  On postoperative day 1, the patient was  awake and alert.  Pain was 8/10.  No nausea or vomiting.  She was using  incentive spirometer.  Vital signs were stable.  She was afebrile.  Hemoglobin 12.6, WBC of 7.6, and INR 1.0.  Urine output 500 mL.  Wound  was clean and dry.  Drain was discontinued without difficulty.  She  continued physical therapy.  On postoperative day 2, the patient was  complaining of minimal pain.  T-max 99.1.  Vital signs were stable.  Hemoglobin 9.7, and INR 1.1.  Dressing was dry, wound edges were benign.  Calf was soft and nontender.  Range of motion 5-45 degrees.   Physical  therapy was continued.  PCA and Foley were discontinued.  On  postoperative day 3, the patient was without complaint except fatigue.  She was afebrile.  Vital signs were stable.  She was alert and oriented.  Hemoglobin 8.3 and INR 2.2.  Dressing was dry.  Wound was benign.  Calf  was soft and nontender.  She was otherwise stable with post blood loss  anemia.  Physical therapy was continued.  On postoperative day 4, the  patient was without complaint.  PT goals were progressing.  She was  afebrile.  Vital signs were stable.  The dressing was dry.  Wound was  benign.  Calf was soft and nontender.  She was neurovascularly intact  and otherwise stable.  She had no further symptoms of her post acute  blood loss anemia.  She was discharged home that evening after passing  her physical therapy goals of safe ambulation and transfer.  Medications  at the time of discharge include Percocet, Robaxin, and Coumadin per  [facility] x2 weeks with a target INR of 1.52.  Dressing  changes daily.  The patient may shower seated.  She should return to  clinic in 1 week's time, sooner should she have any increase in  temperature, drainage from wound, or pain not well-controlled by oral  pain meds and is regular.  She should continue activities, weightbearing  as tolerated with a walker.  Resuming home meds as per the home med rec  sheet.  Once again, the final diagnosis was end-stage DJD of the left  knee.      Laural Benes. Jannet Mantis.      Feliberto Gottron. Turner Daniels, M.D.  Electronically Signed    JBR/MEDQ  D:  04/19/2008  T:  04/20/2008  Job:  [id number]

## 2011-05-16 NOTE — Discharge Summary (Signed)
NAME:  Samantha [name], Samantha [name]                        ACCOUNT NO.:  [id number]   MEDICAL RECORD NO.:  [id number]                   PATIENT TYPE:  INP   LOCATION:  5031                                 FACILITY:  [facility]   PHYSICIAN:  Feliberto Gottron. Turner Daniels, M.D.                DATE OF BIRTH:  02/01/1951   DATE OF ADMISSION:  09/27/2003  DATE OF DISCHARGE:  09/29/2003                                 DISCHARGE SUMMARY   PRIMARY DIAGNOSIS FOR THIS ADMISSION:  End-stage degenerative joint disease  of the left shoulder.   PROCEDURES WHILE IN HOSPITAL:  Left shoulder total arthroplasty.   HISTORY OF PRESENT ILLNESS:  The patient is a 60 year old woman who has end-  stage arthritis of both shoulders, left worse than right symptomatically.  The patient has failed conservative treatment of the left shoulder with anti-  inflammatory medications, cortisone injection, and arthroscopic clean out.  The patient has a large drop osteophyte.  She still has significant pain in  the left shoulder following the scope.  The patient desires elective  shoulder arthroplasty, either total or hemiarthroplasty depending on the  findings of surgery.  Preoperative MRI scan was consistent with  osteoarthritis and partial tear in the rotator cuff.   ALLERGIES:  The patient has no known drug allergies.   MEDICATIONS:  Medications at the time of admission included Lasix, Benicar,  Vicodin, Celebrex, and potassium.   PAST MEDICAL HISTORY:  1. Usual childhood diseases.  2. Hypertension.  3. DJD.   PAST SURGICAL HISTORY:  1. Gallbladder removal.  2. Tubal ligation.  3. Knee arthroscopy.  4. Shoulder arthroscopy.   No difficulties with DVT.   SOCIAL HISTORY:  Positive tobacco, occasional.  Positive ethanol,  occasional.  No IV drug abuse.  She is an Theme park manager.  She is  divorced and lives alone.   FAMILY HISTORY:  Her father died at 64 of complications of diabetes.  Her  mother died at 23 after suffering a  stroke with positive kidney disease,  cancer, and hypertension.   REVIEW OF SYSTEMS:  The patient wears glasses.  He has upper and lower  partials.  He has suffered with hemorrhoids.  Denies any shortness of  breath, chest pain, or melena.   PHYSICAL EXAMINATION:  GENERAL APPEARANCE:  The patient is a 5 feet 2 inch,  220-pound female.  VITAL SIGNS:  The pulse is 60, respirations 16, blood pressure 130/90, and  temperature 97.7 degrees.  HEENT:  Head:  Normocephalic and atraumatic.  Ears:  TMs clear.  Eyes:  Pupils equal, round, and reactive to light and accommodation.  Nose:  Clear.  Throat:  Benign.  NECK:  Supple.  Full range of motion.  CHEST:  Clear to auscultation and percussion.  HEART:  Regular rate and rhythm.  ABDOMEN:  Soft and nontender.  No masses identified.  EXTREMITIES:  The left shoulder shows only 45 degrees  of forward flexion  actively and 90 degrees passively.  Rotates 20 degrees with positive propos  and pain with any range of motion.  Neurovascularly intact.   LABORATORY DATA:  X-rays showed bone on bone DJD with positive drop  osteophyte.  Preoperative labs, including CBC, CMET, chest x-ray, EKG, PT,  and PTT were all within normal limits with the exception of the hematocrit  which was 35.6.  The AST was 48 and the ALT was 57.   HOSPITAL COURSE:  On the day of admission, the patient was taken to the  operating room at [facility] where she underwent a left shoulder  hemiarthroplasty using a Dupuy variable shoulder system, #8 stem, 52 mm  __________, and 15 mm head.  The patient was placed on perioperative  antibiotics for the first 48 hours.  She was placed on Coumadin prophylaxis  with an target INR of 1.5-2 per pharmacy protocol.  She was placed on  postoperative PCA for pain control.  On postoperative day #1, the patient  was complaining of moderate pain, nausea, and vomiting.  Temperature maximum  98.8 degrees.  Hemoglobin 10.3 and PT of 12.7.  The  dressing was dry.  The  patient remained neurovascularly intact and was otherwise doing well.  She  began tandem exercises.  Her PCA was discontinued.  On postoperative day #2,  the patient was without complaints.  She was afebrile and vital signs were  stable with a hemoglobin of 9.6 and an INR of 1.0.  The dressing was dry.  The wound was benign.  A new dressing was applied.  She remained  neurovascularly intact and medically improved.   DISPOSITION:  She was discharged home to the care of her family having  undergone a left total shoulder hemiarthroplasty.   MEDICATIONS AT THE TIME OF DISCHARGE:  1. Vicodin for pain control.  2. Coumadin per pharmacy protocol and followed by the [facility].  3. Resume home medications.   DIET:  Regular.   ACTIVITY:  Gentle pendulum exercises using sling immobilizer and pillow  underneath her elbow.   WOUND CARE:  Dressing changes every other day.   FOLLOWUP:  Return to clinic in approximately one week's time for followup  check.      Laural Benes. Jannet Mantis.                     Feliberto Gottron. Turner Daniels, M.D.    Merita Norton  D:  11/06/2003  T:  11/06/2003  Job:  [id number]

## 2011-05-16 NOTE — Op Note (Signed)
Samantha [name], [name]              ACCOUNT NO.:  [id number]   MEDICAL RECORD NO.:  [id number]          PATIENT TYPE:  INP   LOCATION:  2899                         FACILITY:  [facility]   PHYSICIAN:  Feliberto Gottron. Turner Daniels, M.D.   DATE OF BIRTH:  February 24, 1951   DATE OF PROCEDURE:  02/25/2006  DATE OF DISCHARGE:                                 OPERATIVE REPORT   PREOPERATIVE DIAGNOSIS:  End stage arthritis of right knee with varus and  flexion deformities.   POSTOPERATIVE DIAGNOSIS:  End stage arthritis of right knee with varus and  flexion deformities.   PROCEDURE:  Right total knee arthroplasty using DePuy Sigma RP components, 4  tibia, 3 femur, 10 RP spacer Sigma, and a 35 mm patellar button.  All  components cemented DePuy HV cement with 1500 mg Zinacef, double batch.   SURGEON:  Feliberto Gottron. Turner Daniels, M.D.   FIRST ASSISTANT:  Erskine Squibb B. Su Hilt, P.A.-C.   ANESTHESIA:  General endotracheal anesthesia.   ESTIMATED BLOOD LOSS:  Minimal.   FLUID REPLACEMENT:  One liter crystalloid.   DRAINS PLACED:  Two medium Hemovacs and a Foley catheter.   URINE OUTPUT:  300 mL.   TOURNIQUET TIME:  1 hour 40 minutes.   INDICATIONS FOR PROCEDURE:  60 year old woman with end stage arthritis of  her right knee with a 15 degrees flexion deformity and about a 5-10 degrees  varus deformity, bone-on-bone arthritic changes medially with large  osteophytes, who has failed conservative treatment with anti-inflammatory  medicines, physical therapy, observation, and activity modification.  She is  morbidly obese and has attempted weight loss but this has simply not worked  out.  The risks and benefits of surgical intervention, specifically total  knee arthroplasty, are discussed, questions answered, and she is prepared  for surgical intervention.   DESCRIPTION OF PROCEDURE:  The patient identified by armband, taken to the  operating room  [facility].  Appropriate anesthetic monitors were  attached and general  endotracheal anesthesia induced with the patient in a  supine position.  A Foley catheter was inserted.  She did receive a gram of  Ancef preoperatively.  A tourniquet was applied high to the right thigh and  a foot positioner and lateral post applied to the right side of the table.  The right lower extremity was prepped and draped in sterile fashion from the  ankle to the mid thigh and we then wrapped the right lower extremity with an  Esmarch bandage, inflated the tourniquet 350 mmHg, and made an anterior  midline incision 20 cm in length starting one handbreadth above the patella  and going 1 cm medial to and 3 cm distal to the tibial tubercle, through the  skin and subcutaneous tissue down the level of the patellar retinaculum  which was incised and reflected medially allowing a medial parapatellar  arthrotomy maintaining a quarter inch cuff of medial quad tendon.  The  patella was then everted, the prepatellar fat pad resected with  electrocautery.  The superficial medial collateral ligament was elevated  from anterior to posterior leaving intact distally off the medial tibial  condyle allowing [place] to hyperflexed the knee at which point we removed  peripheral osteophytes, specifically from the notch, and then removed the  ACL and PCL with electrocautery.  We then brought the proximal tibia forward  using of a McGill retractor, a posterior medial Z retractor, and a lateral  Homan retractor.  The tibial spine was removed with an osteotome and the  proximal tibia instrumented with the step drill from DePuy followed by the  intramedullary rod with 0 degree posterior slope cutting guide.  Because of  the flexion contracture, we elected to remove about 7 mm of bone medially  and about 11 mm of bone laterally, protecting the posterior structures with  retractors with the cutting guide pinned into place.  We then entered the  distal femur with the step drill just anterior to the PCL origin about  2 mm  anterior and pinned the cutting guide along the epicondylar axis and  performed our distal femoral cut removing 11 mm of the distal femur.  We  sized for a #3 femoral implant and placed the pins in 3 degrees of external  rotation, and hammered into place the #3 chamfer and anterior-posterior  cutting guide.  The anterior-posterior cuts followed by the chamfer cuts  were accomplished without difficulty followed by the box cutting guide and  the box cut without difficulty.  The everted patella was then grasped with  the measuring guide and measured 25 mm and sized for a 35 mm patellar  button.  We removed 9 mm of the posterior aspect of the patella, sized for  the 35 button, and drilled.  The knee was then held in extension allowing [place]  to put a spacer block between the distal femur and the proximal tibia and  the space was found best for a 10 mm RP spacer.  With the knee in extension  and traction applied, we removed the remnants of the medial and lateral  menisci.  The knee was then flexed allowing [place] to remove some posterior  superior osteophytes from the femur and with the knee hyperflexed, we sized  for a #4 tibial baseplate which was then pinned into place, the smokestack  guide placed.  We then reamed for the Delta fit keel trial.  Because of her  very hard bone, we had to use the power saw to set the Delta fins followed  by the Delta fin trial and the trial spacer.  The trial femoral component  was then hammered into place and the 35 mm button placed.  The knee was  taken to full extension and flexed to about 120 degrees limited by adipose  tissue.  At this point, the trial components removed, all bony surfaces were  water picked clean and dried with suction and sponges.  At the back table,  we mixed a double batch of DePuy HV cement with 1500 mg of Zinacef and  applied it to all bony and metallic surfaces of the implants except for the posterior condyles of the femur.  In  order, we then hammered into place a #4  tibial baseplate and removed excess cement, a #3 distal femoral component  and removed excess cement, a 35 mm patellar button was then squeezed into  place and the excess cement removed, and 10 mm RP spacer was placed and the  knee held 5 degrees of flexion with compression as the cement cured.  Medium  Hemovacs were then placed deep in the wound which was once again  water  picked clean.  We checked for tracking of the patella one more time and  found it to track nicely with no thumb applied.  The parapatellar arthrotomy  was then closed with running #1 Vicryl suture, the subcutaneous tissue with  0 and 2-0 undyed Vicryl suture, and the skin with skin staples.  A dressing  of Xeroform, 4x4 dressing sponges, Webril and an Ace wrap were applied.  The  tourniquet was let down.  The patient was awakened and taken to the recovery  room without difficulty.      Feliberto Gottron. Turner Daniels, M.D.  Electronically Signed     FJR/MEDQ  D:  02/25/2006  T:  02/25/2006  Job:  [id number]

## 2011-05-16 NOTE — Discharge Summary (Signed)
Samantha [name], Samantha [name]              ACCOUNT NO.:  [id number]   MEDICAL RECORD NO.:  [id number]          PATIENT TYPE:  INP   LOCATION:  5031                         FACILITY:  [facility]   PHYSICIAN:  Feliberto Gottron. Turner Daniels, M.D.   DATE OF BIRTH:  03-07-1951   DATE OF ADMISSION:  02/25/2006  DATE OF DISCHARGE:  03/01/2006                                 DISCHARGE SUMMARY   PRIMARY DIAGNOSIS THIS ADMISSION:  End-stage degenerative joint disease of  the right knee.   PROCEDURE:  Right total knee arthroplasty.   HISTORY OF PRESENT ILLNESS:  The patient is a 60 year old woman with end-  stage arthritis of her right knee with a 15 degree flexion deformity and  about a 5 to 10 degree varus deformity with bone on bone arthritic changes  medially with large osteophytes by x-ray.  She has failed conservative  treatment, _NSAID medications, physical therapy, observation and activity  modification.  She is morbidly obese and has attempted weight loss but this  has not seemed to work out for her.  Risks and benefits of the surgical  intervention, specifically total knee arthroplasty, were discussed.  Questions answered and she wishes to proceed with intervention.   ALLERGIES:  NO KNOWN DRUG ALLERGIES.   MEDICATIONS ON ADMISSION:  1.  Celebrex.  2.  Calor.  3.  Lasix.  4.  Benicar.  5.  __________   PAST MEDICAL HISTORY:  1.  Usual childhood diseases.  2.  Adult history of hypertension.  3.  DJD.   PAST SURGICAL HISTORY:  1.  Total shoulder in 2003.  2.  Tubal ligation in 1985.  3.  Gallbladder in 1972.  No difficult with GOT.   SOCIAL HISTORY:  Positive tobacco, positive ethanol, negative IV drug use.  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   FAMILY HISTORY:  Mother is deceased at age 8 with a history of CVA,  coronary artery disease, hypertension, and cancer.  Father died at age 22  with a history of diabetes, MI and hypertension.   REVIEW OF SYSTEMS:  Positive for upper  and lower partial dentures, glasses,  she denies shortness of breath or chest pain.  Denies any recent illness.   PHYSICAL EXAMINATION:  VITAL SIGNS:  Temperature 99, pulse 74, respirations  16, blood pressure 148/92.  She is a 5 feet 2 inch, 230 pound female.  HEENT:  Head is normocephalic and atraumatic. Ears:  TMs are clear.  Eyes:  Pupils are equal, round, reactive to light and accommodation.  Nose patent.  Throat benign.  NECK:  Supple, full range of motion.  CHEST:  Clear to auscultation and percussion.  CARDIOVASCULAR:  Regular rate and rhythm.  ABDOMEN:  Soft, nontender, bowel sounds 2+.  EXTREMITIES:  Right knee has range of motion from 10 to 80 degrees with 5 to  10 degree varus deformity.  She is stable ligamentously, has a 1 to 1+  effusion with positive pain and positive crepitus.   X-rays show bone on bone in medial compartment.   Preoperative labs including CBC, CMET, chest x-ray, EKG, PT  and PTT were all  within normal limits with the exception of AST of 54 and ALT of 50.  No  previous history of abnormality.   HOSPITAL COURSE:  On the day of admission, the patient was taken to the  operating room at [facility] where she underwent a  right total knee arthroplasty using DePuy Sigma RP component, #4 tibia, #3  femur, a 10 RP spacer of Sigma variety and a 35 mm patellar button.  All  components cemented with a DePuy HV cement with 1500 mg of Zinacef embedded.  Medium Hemovac double arm was placed to the knee and a Foley catheter was  placed preoperatively.  Patient was placed on preoperative antibiotics.  She  was placed on postoperative Coumadin prophylaxis with a target INR of 1.5 to  2 per pharmacy protocol.  She was placed on postoperative pain control with  PCA Dilaudid pump and physical therapy was begun in the recovery room using  CPM.   On postoperative day #1, patient was awake and alert.  No nausea or  vomiting.  She was taking p.o.  fluids well.  Vital signs were stable. Drain  output 450 since surgery.  Urine output 150 mL per shift.  Hemoglobin 10.5,  wbc 7.1.  wound was clean and dry.  Drain was discontinued as it had become  quiescent.  CPM continued.  Incentive spirometer was encouraged.  PT was  12.5.  She continued with the CPM, weightbearing as tolerated with total  knee precautions with physical therapy.   Postoperative day #2, patient was out of bed to the hall with physical  therapy's help.  Temperature max 101.9, ranging 98.2.  Vital signs were  stable.  Hemoglobin 9.3, wbc 9.  INR of 1.1.  Urine output 1600 mL per  shift.  Dressing had scant dry blood.  Wound was benign.  Lungs clear.  Calf  was soft and nontender.  She continued with her physical therapy.  PCA was  discontinued.   On postoperative day #3, patient was afebrile.  Hemoglobin 9.3, INR1.2.  Wounds were benign.  She continued to make steady progress with physical  therapy.   On postoperative day #4, patient was without complaint, resting well and  voiding well.  Taking food and fluids without difficulty. She was out of bed  and ambulating, transferring independently.  She was afebrile and vital  signs were stable.  Wound was benign.  Calf was soft and her INR was 1.5.  She was discharged home to the care of her family.   DISCHARGE MEDICATIONS:  1.  Klor-Con 10 mg p.o. daily.  2.  Lasix 20 mg p.o. q.a.m.  3.  Benicar 40 g p.o. a.m.  4.  Zantac one p.o. q.a.m.  5.  She will also add Percocet as needed for pain.  6.  Coumadin as directed per pharmacy protocol.   She will return to see [place] in 10 to 12 days for follow-up check.  She should  keep the wound dry until postoperative day #7 until which point she may  begin showering, weightbearing as tolerated with total knee precautions  using a walker.  She will continue with the Coumadin with her Coumadin for two weeks postoperatively, followed up by home health agency.   DIET:   Regular.   She will return to see [place] sooner should she have any temperatures greater  than 101, any discharge from the wound or any increasing pain.  Laural Benes. Jannet Mantis.      Feliberto Gottron. Turner Daniels, M.D.  Electronically Signed    JBR/MEDQ  D:  04/28/2006  T:  04/28/2006  Job:  [id number]

## 2011-05-21 ENCOUNTER — Encounter: Payer: Self-pay | Admitting: Internal Medicine

## 2011-05-22 ENCOUNTER — Ambulatory Visit (INDEPENDENT_AMBULATORY_CARE_PROVIDER_SITE_OTHER): Payer: Medicare Other | Admitting: Internal Medicine

## 2011-05-22 ENCOUNTER — Encounter: Payer: Self-pay | Admitting: Internal Medicine

## 2011-05-22 VITALS — BP 132/80 | HR 72 | Temp 99.3°F | Resp 16 | Ht 62.5 in | Wt 280.0 lb

## 2011-05-22 MED ORDER — LOSARTAN POTASSIUM 100 MG PO TABS: 100.0000 mg | ORAL_TABLET | Freq: Every day | ORAL | Status: DC

## 2011-05-22 MED ORDER — FUROSEMIDE 20 MG PO TABS: 20.0000 mg | ORAL_TABLET | Freq: Every day | ORAL | Status: DC

## 2011-05-22 MED ORDER — AMLODIPINE BESYLATE 5 MG PO TABS: 5.0000 mg | ORAL_TABLET | Freq: Every day | ORAL | Status: DC

## 2011-05-22 NOTE — Assessment & Plan Note (Signed)
Loose wt Recheck labs

## 2011-05-22 NOTE — Patient Instructions (Signed)
Loose more wt

## 2011-05-22 NOTE — Progress Notes (Signed)
  Subjective:    Patient ID: Samantha [name], female    DOB: 1951/03/14, 60 y.o.   MRN: [id number]  HPI  The patient presents for a follow-up of  chronic hypertension, chronic dyslipidemia, OA, pre- diabetes, leg swelling controlled with medicines    Review of Systems  Constitutional: Negative for chills and unexpected weight change.  HENT: Negative for sinus pressure.   Eyes: Negative for pain.  Respiratory: Negative for cough.   Genitourinary: Negative for hematuria.  Musculoskeletal: Positive for gait problem. Negative for joint swelling.  Psychiatric/Behavioral: Negative for confusion and self-injury.   Wt Readings from Last 3 Encounters:  05/22/11 280 lb (127.007 kg)  01/21/11 282 lb (127.914 kg)  10/23/10 283 lb (128.368 kg)       Objective:   Physical Exam  Constitutional: She appears well-developed and well-nourished. No distress.       obese  HENT:  Head: Normocephalic.  Right Ear: External ear normal.  Left Ear: External ear normal.  Nose: Nose normal.  Mouth/Throat: Oropharynx is clear and moist.  Eyes: Conjunctivae are normal. Pupils are equal, round, and reactive to light. Right eye exhibits no discharge. Left eye exhibits no discharge.  Neck: Normal range of motion. Neck supple. No JVD present. No tracheal deviation present. No thyromegaly present.  Cardiovascular: Normal rate, regular rhythm and normal heart sounds.   Pulmonary/Chest: No stridor. No respiratory distress. She has no wheezes.  Abdominal: Soft. Bowel sounds are normal. She exhibits no distension and no mass. There is no tenderness. There is no rebound and no guarding.  Musculoskeletal: She exhibits edema (1+ B ankles) and tenderness (Less tender knees).  Lymphadenopathy:    She has no cervical adenopathy.  Neurological: She displays normal reflexes. No cranial nerve deficit. She exhibits normal muscle tone. Coordination normal.  Skin: No rash noted. No erythema.  Psychiatric: She has a normal  mood and affect. Her behavior is normal. Judgment and thought content normal.        Lab Results  Component Value Date   WBC 5.2 05/16/2011   HGB 13.1 05/16/2011   HCT 39.0 05/16/2011   PLT 188.0 05/16/2011   CHOL 134 05/16/2011   TRIG 70.0 05/16/2011   HDL 53.20 05/16/2011   ALT 89* 05/16/2011   AST 92* 05/16/2011   NA 141 05/16/2011   K 4.5 05/16/2011   CL 109 05/16/2011   CREATININE 0.8 05/16/2011   BUN 13 05/16/2011   CO2 28 05/16/2011   TSH 2.20 05/16/2011   HGBA1C 5.9 03/05/2009     Assessment & Plan:  NONSPEC ELEVATION OF LEVELS OF TRANSAMINASE/LDH Loose wt Recheck labs    OA B KNEES  Better on Rx  Edema B LE  Restart Lasix prn  Obesity  Diet discussed

## 2011-08-22 ENCOUNTER — Encounter: Payer: Self-pay | Admitting: Internal Medicine

## 2011-08-22 ENCOUNTER — Ambulatory Visit (INDEPENDENT_AMBULATORY_CARE_PROVIDER_SITE_OTHER): Payer: Medicare Other | Admitting: Internal Medicine

## 2011-08-22 DIAGNOSIS — G8929 Other chronic pain: Secondary | ICD-10-CM

## 2011-08-22 DIAGNOSIS — K219 Gastro-esophageal reflux disease without esophagitis: Secondary | ICD-10-CM

## 2011-08-22 DIAGNOSIS — M542 Cervicalgia: Secondary | ICD-10-CM

## 2011-08-22 DIAGNOSIS — I10 Essential (primary) hypertension: Secondary | ICD-10-CM

## 2011-08-22 MED ORDER — LOSARTAN POTASSIUM 100 MG PO TABS: 100.0000 mg | ORAL_TABLET | Freq: Every day | ORAL | Status: DC

## 2011-08-22 MED ORDER — FUROSEMIDE 20 MG PO TABS: 20.0000 mg | ORAL_TABLET | Freq: Every day | ORAL | Status: DC

## 2011-08-22 MED ORDER — POTASSIUM CHLORIDE CRYS ER 20 MEQ PO TBCR: 20.0000 meq | EXTENDED_RELEASE_TABLET | Freq: Every day | ORAL | Status: DC

## 2011-08-22 MED ORDER — AMLODIPINE BESYLATE 5 MG PO TABS: 5.0000 mg | ORAL_TABLET | Freq: Every day | ORAL | Status: DC

## 2011-08-22 MED ORDER — RANITIDINE HCL 150 MG PO CAPS: 150.0000 mg | ORAL_CAPSULE | Freq: Two times a day (BID) | ORAL | Status: DC

## 2011-08-22 NOTE — Assessment & Plan Note (Signed)
Lost wt on diet 

## 2011-08-22 NOTE — Progress Notes (Signed)
  Subjective:    Patient ID: Samantha [name], female    DOB: August 03, 1951, 60 y.o.   MRN: [id number]  HPI   The patient is here to follow up on chronic HTN, GERD, no headaches and c/o chronic moderate OA symptoms controlled with medicines C/o L neck pain off and on...   Review of Systems  Constitutional: Negative for chills, activity change, appetite change, fatigue and unexpected weight change.  HENT: Negative for congestion, mouth sores and sinus pressure.   Eyes: Negative for visual disturbance.  Respiratory: Negative for cough and chest tightness.   Gastrointestinal: Negative for nausea and abdominal pain.  Genitourinary: Negative for frequency, difficulty urinating and vaginal pain.  Musculoskeletal: Positive for back pain (neck pain). Negative for gait problem.  Skin: Negative for pallor and rash.  Neurological: Negative for dizziness, tremors, weakness, numbness and headaches.  Psychiatric/Behavioral: Negative for confusion and sleep disturbance.   Wt Readings from Last 3 Encounters:  08/22/11 274 lb (124.286 kg)  05/22/11 280 lb (127.007 kg)  01/21/11 282 lb (127.914 kg)       Objective:   Physical Exam  Constitutional: She appears well-developed. No distress.       Obese  HENT:  Head: Normocephalic.  Right Ear: External ear normal.  Left Ear: External ear normal.  Nose: Nose normal.  Mouth/Throat: Oropharynx is clear and moist.  Eyes: Conjunctivae are normal. Pupils are equal, round, and reactive to light. Right eye exhibits no discharge. Left eye exhibits no discharge.  Neck: Normal range of motion. Neck supple. No JVD present. No tracheal deviation present. No thyromegaly present.  Cardiovascular: Normal rate, regular rhythm and normal heart sounds.   Pulmonary/Chest: No stridor. No respiratory distress. She has no wheezes.  Abdominal: Soft. Bowel sounds are normal. She exhibits no distension and no mass. There is no tenderness. There is no rebound and no guarding.    Musculoskeletal: She exhibits no edema and no tenderness.  Lymphadenopathy:    She has no cervical adenopathy.  Neurological: She displays normal reflexes. No cranial nerve deficit. She exhibits normal muscle tone. Coordination normal.  Skin: No rash noted. No erythema.  Psychiatric: She has a normal mood and affect. Her behavior is normal. Judgment and thought content normal.          Assessment & Plan:

## 2011-08-22 NOTE — Assessment & Plan Note (Signed)
Controlled on Rx 

## 2011-08-22 NOTE — Assessment & Plan Note (Signed)
Cont Rx Cont w/wt loss

## 2011-08-22 NOTE — Patient Instructions (Signed)
Wt Readings from Last 3 Encounters:  08/22/11 274 lb (124.286 kg)  05/22/11 280 lb (127.007 kg)  01/21/11 282 lb (127.914 kg)    BP Readings from Last 3 Encounters:  08/22/11 110/78  05/22/11 132/80  01/21/11 120/86    Stretch your neck Hold your book higher

## 2011-08-22 NOTE — Assessment & Plan Note (Signed)
Heat Improve ergonomics ROM exercises given

## 2011-09-22 LAB — DIFFERENTIAL
Basophils Relative: 2 — ABNORMAL HIGH
Eosinophils Absolute: 0.1
Monocytes Relative: 11
Neutrophils Relative %: 48

## 2011-09-22 LAB — BASIC METABOLIC PANEL
BUN: 5 — ABNORMAL LOW
Calcium: 8.3 — ABNORMAL LOW
Creatinine, Ser: 0.69
GFR calc non Af Amer: >60
Glucose, Bld: 147 — ABNORMAL HIGH
Sodium: 135

## 2011-09-22 LAB — CBC
HCT: 38.7
Hemoglobin: 12.6
Hemoglobin: 8.2 — ABNORMAL LOW
MCV: 82.7
MCV: 83.2
Platelets: 190
Platelets: 149 — ABNORMAL LOW
Platelets: 173
RBC: 4.67
RBC: 3.06 — ABNORMAL LOW
RDW: 14.7
RDW: 15.1
RDW: 15.2
WBC: 4.6
WBC: 8.8
WBC: 9.3

## 2011-09-22 LAB — TYPE AND SCREEN
ABO/RH(D): O POS
Antibody Screen: NEGATIVE

## 2011-09-22 LAB — COMPREHENSIVE METABOLIC PANEL
ALT: 70 — ABNORMAL HIGH
Alkaline Phosphatase: 92
CO2: 25
Chloride: 106
GFR calc non Af Amer: >60
Glucose, Bld: 87
Potassium: 4.3
Sodium: 138
Total Protein: 7.3

## 2011-09-22 LAB — PROTIME-INR
INR: 0.9
INR: 1
INR: 2.2 — ABNORMAL HIGH
INR: 2.2 — ABNORMAL HIGH
Prothrombin Time: 14.7
Prothrombin Time: 24.7 — ABNORMAL HIGH
Prothrombin Time: 25.5 — ABNORMAL HIGH

## 2011-09-22 LAB — URINALYSIS, ROUTINE W REFLEX MICROSCOPIC
Bilirubin Urine: NEGATIVE
Hgb urine dipstick: NEGATIVE
Specific Gravity, Urine: 1.028
Urobilinogen, UA: 1

## 2011-09-22 LAB — URINE MICROSCOPIC-ADD ON

## 2011-11-19 ENCOUNTER — Ambulatory Visit (AMBULATORY_SURGERY_CENTER): Payer: Medicare Other | Admitting: *Deleted

## 2011-11-19 VITALS — Ht 62.0 in | Wt 278.9 lb

## 2011-11-19 DIAGNOSIS — Z1211 Encounter for screening for malignant neoplasm of colon: Secondary | ICD-10-CM

## 2011-11-19 MED ORDER — MOVIPREP 100 G PO SOLR: 1.0000 | Freq: Once | ORAL | Status: DC

## 2011-11-25 ENCOUNTER — Ambulatory Visit (INDEPENDENT_AMBULATORY_CARE_PROVIDER_SITE_OTHER): Payer: Medicare Other | Admitting: Internal Medicine

## 2011-11-25 ENCOUNTER — Encounter: Payer: Self-pay | Admitting: Internal Medicine

## 2011-11-25 VITALS — BP 122/86 | HR 80 | Temp 98.3°F | Resp 16 | Wt 272.0 lb

## 2011-11-25 DIAGNOSIS — J40 Bronchitis, not specified as acute or chronic: Secondary | ICD-10-CM

## 2011-11-25 DIAGNOSIS — M1990 Unspecified osteoarthritis, unspecified site: Secondary | ICD-10-CM

## 2011-11-25 DIAGNOSIS — Z23 Encounter for immunization: Secondary | ICD-10-CM

## 2011-11-25 MED ORDER — TRAMADOL HCL 50 MG PO TABS: 50.0000 mg | ORAL_TABLET | Freq: Two times a day (BID) | ORAL | Status: DC | PRN

## 2011-11-25 MED ORDER — AMOXICILLIN 500 MG PO CAPS: 1000.0000 mg | ORAL_CAPSULE | Freq: Two times a day (BID) | ORAL | Status: AC

## 2011-11-25 NOTE — Assessment & Plan Note (Signed)
Amox x 10 d 

## 2011-11-25 NOTE — Assessment & Plan Note (Addendum)
Diffuse B TKR L shoulder replacement I filled out her disability form Tramadol prn

## 2011-11-25 NOTE — Progress Notes (Signed)
  Subjective:    Patient ID: Samantha [name], female    DOB: 1951/07/07, 60 y.o.   MRN: [id number]  HPI  C/o cough prod for yellow mucus x 3 wks - not better F/o OA with neck pain, shoulders and knees  Review of Systems  Constitutional: Negative for chills, activity change, appetite change, fatigue and unexpected weight change.  HENT: Positive for postnasal drip and sinus pressure. Negative for congestion and mouth sores.   Eyes: Negative for visual disturbance.  Respiratory: Positive for cough. Negative for chest tightness.   Gastrointestinal: Negative for nausea and abdominal pain.  Genitourinary: Negative for frequency, difficulty urinating and vaginal pain.  Musculoskeletal: Positive for back pain (neck pain) and arthralgias. Negative for gait problem.  Skin: Negative for pallor and rash.  Neurological: Negative for dizziness, tremors, weakness, numbness and headaches.  Psychiatric/Behavioral: Negative for confusion and sleep disturbance.       Objective:   Physical Exam  Constitutional: She appears well-developed. No distress.       obese  HENT:  Head: Normocephalic.  Right Ear: External ear normal.  Left Ear: External ear normal.  Nose: Nose normal.  Mouth/Throat: Oropharynx is clear and moist.  Eyes: Conjunctivae are normal. Pupils are equal, round, and reactive to light. Right eye exhibits no discharge. Left eye exhibits no discharge.  Neck: Normal range of motion. Neck supple. No JVD present. No tracheal deviation present. No thyromegaly present.  Cardiovascular: Normal rate, regular rhythm and normal heart sounds.   Pulmonary/Chest: No stridor. No respiratory distress. She has no wheezes.  Abdominal: Soft. Bowel sounds are normal. She exhibits no distension and no mass. There is no tenderness. There is no rebound and no guarding.  Musculoskeletal: She exhibits tenderness. She exhibits no edema.       Neck and shoulders pain, B knees w/post-op changes  Lymphadenopathy:      She has no cervical adenopathy.  Neurological: She displays normal reflexes. No cranial nerve deficit. She exhibits normal muscle tone. Coordination normal.  Skin: No rash noted. No erythema.  Psychiatric: She has a normal mood and affect. Her behavior is normal. Judgment and thought content normal.   Limp       Assessment & Plan:

## 2011-12-03 ENCOUNTER — Encounter: Payer: Self-pay | Admitting: Gastroenterology

## 2011-12-03 ENCOUNTER — Ambulatory Visit (AMBULATORY_SURGERY_CENTER): Payer: Medicare Other | Admitting: Gastroenterology

## 2011-12-03 VITALS — BP 139/78 | HR 76 | Temp 98.5°F | Resp 22 | Ht 62.0 in | Wt 272.0 lb

## 2011-12-03 DIAGNOSIS — Z1211 Encounter for screening for malignant neoplasm of colon: Secondary | ICD-10-CM

## 2011-12-03 DIAGNOSIS — D1330 Benign neoplasm of unspecified part of small intestine: Secondary | ICD-10-CM

## 2011-12-03 DIAGNOSIS — D126 Benign neoplasm of colon, unspecified: Secondary | ICD-10-CM

## 2011-12-03 DIAGNOSIS — K6389 Other specified diseases of intestine: Secondary | ICD-10-CM

## 2011-12-03 DIAGNOSIS — K5730 Diverticulosis of large intestine without perforation or abscess without bleeding: Secondary | ICD-10-CM | POA: Insufficient documentation

## 2011-12-03 MED ORDER — SODIUM CHLORIDE 0.9 % IV SOLN: 500.0000 mL | INTRAVENOUS | Status: DC

## 2011-12-03 NOTE — Patient Instructions (Signed)
FOLLOW THE INSTRUCTIONS ON THE GREEN AND BLUE SHEETS.  CONTINUE YOUR MEDICATIONS.  AWAIT PATHOLOGY RESULTS.

## 2011-12-03 NOTE — Progress Notes (Signed)
12/03/11 attempt right hand by Ardeen Jourdain unsucessful.   Second attempt Right lateral hand by Elmon Kirschner RN unsucessful.   Last attempt right wrist sucessful by Elmon Kirschner RN.

## 2011-12-03 NOTE — Progress Notes (Signed)
Patient did not experience any of the following events: a burn prior to discharge; a fall within the facility; wrong site/side/patient/procedure/implant event; or a hospital transfer or hospital admission upon discharge from the facility. (G8907) Patient did not have preoperative order for IV antibiotic SSI prophylaxis. ([id number])  

## 2011-12-04 ENCOUNTER — Telehealth: Payer: Self-pay

## 2011-12-04 NOTE — Telephone Encounter (Signed)

## 2011-12-08 ENCOUNTER — Encounter: Payer: Self-pay | Admitting: Gastroenterology

## 2012-03-05 ENCOUNTER — Other Ambulatory Visit (INDEPENDENT_AMBULATORY_CARE_PROVIDER_SITE_OTHER): Payer: Medicare Other

## 2012-03-05 LAB — COMPREHENSIVE METABOLIC PANEL
AST: 65 U/L — ABNORMAL HIGH (ref 0–37)
Albumin: 3.8 g/dL (ref 3.5–5.2)
Alkaline Phosphatase: 89 U/L (ref 39–117)
BUN: 15 mg/dL (ref 6–23)
Glucose, Bld: 96 mg/dL (ref 70–99)
Potassium: 4.1 mEq/L (ref 3.5–5.1)
Total Bilirubin: 0.5 mg/dL (ref 0.3–1.2)

## 2012-03-23 ENCOUNTER — Ambulatory Visit (INDEPENDENT_AMBULATORY_CARE_PROVIDER_SITE_OTHER): Payer: Medicare Other | Admitting: Internal Medicine

## 2012-03-23 ENCOUNTER — Encounter: Payer: Self-pay | Admitting: Internal Medicine

## 2012-03-23 VITALS — BP 130/80 | HR 80 | Temp 98.3°F | Resp 16 | Wt 270.0 lb

## 2012-03-23 DIAGNOSIS — J40 Bronchitis, not specified as acute or chronic: Secondary | ICD-10-CM

## 2012-03-23 DIAGNOSIS — K7581 Nonalcoholic steatohepatitis (NASH): Secondary | ICD-10-CM | POA: Insufficient documentation

## 2012-03-23 DIAGNOSIS — K760 Fatty (change of) liver, not elsewhere classified: Secondary | ICD-10-CM

## 2012-03-23 DIAGNOSIS — M545 Low back pain: Secondary | ICD-10-CM

## 2012-03-23 DIAGNOSIS — Z23 Encounter for immunization: Secondary | ICD-10-CM

## 2012-03-23 DIAGNOSIS — I10 Essential (primary) hypertension: Secondary | ICD-10-CM

## 2012-03-23 DIAGNOSIS — K7689 Other specified diseases of liver: Secondary | ICD-10-CM

## 2012-03-23 DIAGNOSIS — D126 Benign neoplasm of colon, unspecified: Secondary | ICD-10-CM

## 2012-03-23 MED ORDER — AMLODIPINE BESYLATE 5 MG PO TABS: 5.0000 mg | ORAL_TABLET | Freq: Every day | ORAL | Status: DC

## 2012-03-23 MED ORDER — FLUTICASONE PROPIONATE 50 MCG/ACT NA SUSP: 2.0000 | Freq: Every day | NASAL | Status: DC

## 2012-03-23 MED ORDER — LOSARTAN POTASSIUM 100 MG PO TABS: 100.0000 mg | ORAL_TABLET | Freq: Every day | ORAL | Status: DC

## 2012-03-23 MED ORDER — POTASSIUM CHLORIDE CRYS ER 20 MEQ PO TBCR: 20.0000 meq | EXTENDED_RELEASE_TABLET | Freq: Every day | ORAL | Status: DC

## 2012-03-23 MED ORDER — RANITIDINE HCL 150 MG PO CAPS: 150.0000 mg | ORAL_CAPSULE | Freq: Two times a day (BID) | ORAL | Status: DC

## 2012-03-23 MED ORDER — AMOXICILLIN 500 MG PO CAPS: 1000.0000 mg | ORAL_CAPSULE | Freq: Two times a day (BID) | ORAL | Status: AC

## 2012-03-23 MED ORDER — TRAMADOL HCL 50 MG PO TABS: 50.0000 mg | ORAL_TABLET | Freq: Two times a day (BID) | ORAL | Status: DC | PRN

## 2012-03-23 MED ORDER — FUROSEMIDE 20 MG PO TABS: 20.0000 mg | ORAL_TABLET | Freq: Every day | ORAL | Status: DC

## 2012-03-23 NOTE — Assessment & Plan Note (Signed)
Better  

## 2012-03-23 NOTE — Assessment & Plan Note (Signed)
Amoxicillin

## 2012-03-23 NOTE — Assessment & Plan Note (Signed)
Continue with current prescription therapy as reflected on the Med list.  

## 2012-03-23 NOTE — Assessment & Plan Note (Signed)
Monitoring LFTs Loose wt

## 2012-03-23 NOTE — Progress Notes (Signed)
Patient ID: Samantha [name], female   DOB: 1951-03-03, 61 y.o.   MRN: [id number]  Subjective:    Patient ID: Samantha [name], female    DOB: 1951-03-29, 61 y.o.   MRN: [id number]  HPI   F/u OA with neck pain, shoulders and knees; HTN, colon polyps - s/p colonoscopy C/o URI - x2  Review of Systems  Constitutional: Negative for chills, activity change, appetite change, fatigue and unexpected weight change.  HENT: Negative for congestion, mouth sores, postnasal drip and sinus pressure.   Eyes: Negative for visual disturbance.  Respiratory: Negative for cough and chest tightness.   Gastrointestinal: Negative for nausea and abdominal pain.  Genitourinary: Negative for frequency, difficulty urinating and vaginal pain.  Musculoskeletal: Positive for back pain (neck pain) and arthralgias. Negative for gait problem.  Skin: Negative for pallor and rash.  Neurological: Negative for dizziness, tremors, weakness, numbness and headaches.  Psychiatric/Behavioral: Negative for suicidal ideas, confusion and sleep disturbance.   Wt Readings from Last 3 Encounters:  03/23/12 270 lb (122.471 kg)  12/03/11 272 lb (123.378 kg)  11/25/11 272 lb (123.378 kg)   BP Readings from Last 3 Encounters:  03/23/12 130/80  12/03/11 139/78  11/25/11 122/86        Objective:   Physical Exam  Constitutional: She appears well-developed. No distress.       obese  HENT:  Head: Normocephalic.  Right Ear: External ear normal.  Left Ear: External ear normal.  Nose: Nose normal.  Mouth/Throat: Oropharynx is clear and moist.  Eyes: Conjunctivae are normal. Pupils are equal, round, and reactive to light. Right eye exhibits no discharge. Left eye exhibits no discharge.  Neck: Normal range of motion. Neck supple. No JVD present. No tracheal deviation present. No thyromegaly present.  Cardiovascular: Normal rate, regular rhythm and normal heart sounds.   Pulmonary/Chest: No stridor. No respiratory distress. She has no  wheezes.  Abdominal: Soft. Bowel sounds are normal. She exhibits no distension and no mass. There is no tenderness. There is no rebound and no guarding.  Musculoskeletal: She exhibits tenderness. She exhibits no edema.       Neck and shoulders pain, B knees w/post-op changes  Lymphadenopathy:    She has no cervical adenopathy.  Neurological: She displays normal reflexes. No cranial nerve deficit. She exhibits normal muscle tone. Coordination normal.  Skin: No rash noted. No erythema.  Psychiatric: She has a normal mood and affect. Her behavior is normal. Judgment and thought content normal.   Limp  Lab Results  Component Value Date   WBC 5.2 05/16/2011   HGB 13.1 05/16/2011   HCT 39.0 05/16/2011   PLT 188.0 05/16/2011   GLUCOSE 96 03/05/2012   CHOL 134 05/16/2011   TRIG 70.0 05/16/2011   HDL 53.20 05/16/2011   LDLCALC 67 05/16/2011   ALT 60* 03/05/2012   AST 65* 03/05/2012   NA 140 03/05/2012   K 4.1 03/05/2012   CL 106 03/05/2012   CREATININE 0.8 03/05/2012   BUN 15 03/05/2012   CO2 29 03/05/2012   TSH 2.20 05/16/2011   INR 2.2* 03/17/2008   HGBA1C 5.9 03/05/2009        Assessment & Plan:

## 2012-03-23 NOTE — Assessment & Plan Note (Signed)
Polyps  Last colon 2012 Dr Jarold Motto

## 2012-04-09 ENCOUNTER — Other Ambulatory Visit: Payer: Self-pay | Admitting: Obstetrics and Gynecology

## 2012-04-09 DIAGNOSIS — Z1231 Encounter for screening mammogram for malignant neoplasm of breast: Secondary | ICD-10-CM

## 2012-05-04 ENCOUNTER — Ambulatory Visit
Admission: RE | Admit: 2012-05-04 | Discharge: 2012-05-04 | Disposition: A | Discharge status: Home / Self Care | Payer: Medicare Other | Source: Ambulatory Visit | Attending: Obstetrics and Gynecology | Admitting: Obstetrics and Gynecology

## 2012-05-04 DIAGNOSIS — Z1231 Encounter for screening mammogram for malignant neoplasm of breast: Secondary | ICD-10-CM

## 2012-06-01 ENCOUNTER — Encounter: Payer: Self-pay | Admitting: Obstetrics and Gynecology

## 2012-07-28 ENCOUNTER — Ambulatory Visit: Payer: Medicare Other

## 2012-07-28 ENCOUNTER — Encounter: Payer: Self-pay | Admitting: Internal Medicine

## 2012-07-28 ENCOUNTER — Ambulatory Visit (INDEPENDENT_AMBULATORY_CARE_PROVIDER_SITE_OTHER): Payer: Medicare Other | Admitting: Internal Medicine

## 2012-07-28 VITALS — BP 114/82 | HR 80 | Temp 98.2°F | Resp 16 | Wt 277.0 lb

## 2012-07-28 DIAGNOSIS — G8929 Other chronic pain: Secondary | ICD-10-CM

## 2012-07-28 DIAGNOSIS — K760 Fatty (change of) liver, not elsewhere classified: Secondary | ICD-10-CM

## 2012-07-28 DIAGNOSIS — J40 Bronchitis, not specified as acute or chronic: Secondary | ICD-10-CM

## 2012-07-28 DIAGNOSIS — E785 Hyperlipidemia, unspecified: Secondary | ICD-10-CM

## 2012-07-28 DIAGNOSIS — M545 Low back pain, unspecified: Secondary | ICD-10-CM

## 2012-07-28 DIAGNOSIS — M25569 Pain in unspecified knee: Secondary | ICD-10-CM

## 2012-07-28 DIAGNOSIS — M542 Cervicalgia: Secondary | ICD-10-CM

## 2012-07-28 DIAGNOSIS — D126 Benign neoplasm of colon, unspecified: Secondary | ICD-10-CM

## 2012-07-28 DIAGNOSIS — I10 Essential (primary) hypertension: Secondary | ICD-10-CM

## 2012-07-28 LAB — URINALYSIS, ROUTINE W REFLEX MICROSCOPIC
Bilirubin Urine: NEGATIVE
Hgb urine dipstick: NEGATIVE
Ketones, ur: NEGATIVE
Nitrite: NEGATIVE
Specific Gravity, Urine: 1.02 (ref 1.000–1.030)
Total Protein, Urine: NEGATIVE
Urine Glucose: NEGATIVE
Urobilinogen, UA: 0.2 (ref 0.0–1.0)
pH: 6 (ref 5.0–8.0)

## 2012-07-28 LAB — BASIC METABOLIC PANEL
CO2: 27 mEq/L (ref 19–32)
Chloride: 105 mEq/L (ref 96–112)
Creatinine, Ser: 0.7 mg/dL (ref 0.4–1.2)
Potassium: 4.2 mEq/L (ref 3.5–5.1)
Sodium: 140 mEq/L (ref 135–145)

## 2012-07-28 LAB — HEPATIC FUNCTION PANEL
ALT: 72 U/L — ABNORMAL HIGH (ref 0–35)
AST: 73 U/L — ABNORMAL HIGH (ref 0–37)
Alkaline Phosphatase: 82 U/L (ref 39–117)
Bilirubin, Direct: 0.2 mg/dL (ref 0.0–0.3)
Total Bilirubin: 0.6 mg/dL (ref 0.3–1.2)
Total Protein: 7.8 g/dL (ref 6.0–8.3)

## 2012-07-28 MED ORDER — AMLODIPINE BESYLATE 5 MG PO TABS: 5.0000 mg | ORAL_TABLET | Freq: Every day | ORAL | Status: DC

## 2012-07-28 MED ORDER — FUROSEMIDE 20 MG PO TABS: 20.0000 mg | ORAL_TABLET | Freq: Every day | ORAL | Status: DC

## 2012-07-28 MED ORDER — LOSARTAN POTASSIUM 100 MG PO TABS: 100.0000 mg | ORAL_TABLET | Freq: Every day | ORAL | Status: DC

## 2012-07-28 MED ORDER — POTASSIUM CHLORIDE CRYS ER 20 MEQ PO TBCR: 20.0000 meq | EXTENDED_RELEASE_TABLET | Freq: Every day | ORAL | Status: DC

## 2012-07-28 NOTE — Assessment & Plan Note (Signed)
Wt Readings from Last 3 Encounters:  07/28/12 277 lb (125.646 kg)  03/23/12 270 lb (122.471 kg)  12/03/11 272 lb (123.378 kg)

## 2012-07-28 NOTE — Assessment & Plan Note (Signed)
Continue with current prescription therapy as reflected on the Med list.  

## 2012-07-28 NOTE — Assessment & Plan Note (Signed)
Tramadol, Aleve prn

## 2012-07-28 NOTE — Progress Notes (Signed)
  Subjective:    Patient ID: Samantha [name], female    DOB: 10-12-1951, 61 y.o.   MRN: [id number]  HPI   F/u OA with neck pain, shoulders and knees; HTN, colon polyps - s/p colonoscopy C/o allergies  Review of Systems  Constitutional: Negative for chills, activity change, appetite change, fatigue and unexpected weight change.  HENT: Negative for congestion, mouth sores, postnasal drip and sinus pressure.   Eyes: Negative for visual disturbance.  Respiratory: Negative for cough and chest tightness.   Gastrointestinal: Negative for nausea and abdominal pain.  Genitourinary: Negative for frequency, difficulty urinating and vaginal pain.  Musculoskeletal: Positive for back pain (neck pain) and arthralgias. Negative for gait problem.  Skin: Negative for pallor and rash.  Neurological: Negative for dizziness, tremors, weakness, numbness and headaches.  Psychiatric/Behavioral: Negative for suicidal ideas, confusion and disturbed wake/sleep cycle.   Wt Readings from Last 3 Encounters:  07/28/12 277 lb (125.646 kg)  03/23/12 270 lb (122.471 kg)  12/03/11 272 lb (123.378 kg)   BP Readings from Last 3 Encounters:  07/28/12 114/82  03/23/12 130/80  12/03/11 139/78        Objective:   Physical Exam  Constitutional: She appears well-developed. No distress.       obese  HENT:  Head: Normocephalic.  Right Ear: External ear normal.  Left Ear: External ear normal.  Nose: Nose normal.  Mouth/Throat: Oropharynx is clear and moist.  Eyes: Conjunctivae are normal. Pupils are equal, round, and reactive to light. Right eye exhibits no discharge. Left eye exhibits no discharge.  Neck: Normal range of motion. Neck supple. No JVD present. No tracheal deviation present. No thyromegaly present.  Cardiovascular: Normal rate, regular rhythm and normal heart sounds.   Pulmonary/Chest: No stridor. No respiratory distress. She has no wheezes.  Abdominal: Soft. Bowel sounds are normal. She exhibits no  distension and no mass. There is no tenderness. There is no rebound and no guarding.  Musculoskeletal: She exhibits tenderness. She exhibits no edema.       Neck and shoulders pain, B knees w/post-op changes  Lymphadenopathy:    She has no cervical adenopathy.  Neurological: She displays normal reflexes. No cranial nerve deficit. She exhibits normal muscle tone. Coordination normal.  Skin: No rash noted. No erythema.  Psychiatric: She has a normal mood and affect. Her behavior is normal. Judgment and thought content normal.   Limp  Lab Results  Component Value Date   WBC 5.2 05/16/2011   HGB 13.1 05/16/2011   HCT 39.0 05/16/2011   PLT 188.0 05/16/2011   GLUCOSE 96 03/05/2012   CHOL 134 05/16/2011   TRIG 70.0 05/16/2011   HDL 53.20 05/16/2011   LDLCALC 67 05/16/2011   ALT 60* 03/05/2012   AST 65* 03/05/2012   NA 140 03/05/2012   K 4.1 03/05/2012   CL 106 03/05/2012   CREATININE 0.8 03/05/2012   BUN 15 03/05/2012   CO2 29 03/05/2012   TSH 2.20 05/16/2011   INR 2.2* 03/17/2008   HGBA1C 5.9 03/05/2009        Assessment & Plan:

## 2012-07-29 ENCOUNTER — Telehealth: Payer: Self-pay | Admitting: Internal Medicine

## 2012-07-29 MED ORDER — CIPROFLOXACIN HCL 250 MG PO TABS: 250.0000 mg | ORAL_TABLET | Freq: Two times a day (BID) | ORAL | Status: AC

## 2012-07-29 NOTE — Telephone Encounter (Signed)
Left detailed mess informing pt of below.  

## 2012-07-29 NOTE — Telephone Encounter (Signed)
Samantha [name], please, inform patient that she has a UTI Take abx Thx

## 2012-10-07 DIAGNOSIS — Z0279 Encounter for issue of other medical certificate: Secondary | ICD-10-CM

## 2012-11-05 ENCOUNTER — Encounter: Payer: Self-pay | Admitting: Obstetrics and Gynecology

## 2012-11-05 ENCOUNTER — Ambulatory Visit (INDEPENDENT_AMBULATORY_CARE_PROVIDER_SITE_OTHER): Payer: Medicare Other | Admitting: Obstetrics and Gynecology

## 2012-11-05 VITALS — BP 126/82 | Resp 14 | Ht 63.0 in | Wt 283.0 lb

## 2012-11-05 DIAGNOSIS — Z124 Encounter for screening for malignant neoplasm of cervix: Secondary | ICD-10-CM

## 2012-11-05 DIAGNOSIS — Z01419 Encounter for gynecological examination (general) (routine) without abnormal findings: Secondary | ICD-10-CM

## 2012-11-05 DIAGNOSIS — E669 Obesity, unspecified: Secondary | ICD-10-CM

## 2012-11-05 NOTE — Addendum Note (Signed)
Addended by: Marla Roe A on: 11/05/2012 10:20 AM   Modules accepted: Orders

## 2012-11-05 NOTE — Progress Notes (Signed)
Patient ID: Samantha [name], female   DOB: 04/02/1951, 61 y.o.   MRN: [id number] Contraception PM Last pap 10/2011 wnl Last Mammo 12/2011 wnl Last Colonoscopy 11/2011 cancerous polyps removed Last Dexa Scan never Primary MD Dr. Posey Rea Abuse at Home None  No complaints.  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  Filed Vitals:   11/05/12 0939  BP: 126/82  Resp: 14   ROS: noncontributory  Physical Examination: General appearance - alert, well appearing, and in no distress Neck - supple, no significant adenopathy Chest - clear to auscultation, no wheezes, rales or rhonchi, symmetric air entry Heart - normal rate and regular rhythm Abdomen - soft, nontender, nondistended, no masses or organomegaly Breasts - breasts appear normal, no suspicious masses, no skin or nipple changes or axillary nodes Pelvic - normal external genitalia, vulva, vagina, cervix, no palpable masses, difficult secondary to habitus Back exam - no CVAT Extremities - no edema, redness or tenderness in the calves or thighs  A/P Nl pap last yr - will do pap next yr Had nl mammo in Feb per pt sched u/s secondary to habitus - unable to palpate ovaries

## 2012-11-23 ENCOUNTER — Ambulatory Visit (INDEPENDENT_AMBULATORY_CARE_PROVIDER_SITE_OTHER): Payer: Medicare Other | Admitting: Obstetrics and Gynecology

## 2012-11-23 ENCOUNTER — Other Ambulatory Visit: Payer: Self-pay | Admitting: Obstetrics and Gynecology

## 2012-11-23 ENCOUNTER — Ambulatory Visit (INDEPENDENT_AMBULATORY_CARE_PROVIDER_SITE_OTHER): Payer: Medicare Other

## 2012-11-23 ENCOUNTER — Encounter: Payer: Self-pay | Admitting: Obstetrics and Gynecology

## 2012-11-23 VITALS — BP 120/80 | Resp 14 | Ht 63.0 in | Wt 286.0 lb

## 2012-11-23 DIAGNOSIS — D219 Benign neoplasm of connective and other soft tissue, unspecified: Secondary | ICD-10-CM

## 2012-11-23 DIAGNOSIS — E669 Obesity, unspecified: Secondary | ICD-10-CM

## 2012-11-23 DIAGNOSIS — D259 Leiomyoma of uterus, unspecified: Secondary | ICD-10-CM

## 2012-11-23 DIAGNOSIS — Z09 Encounter for follow-up examination after completed treatment for conditions other than malignant neoplasm: Secondary | ICD-10-CM

## 2012-11-23 NOTE — Progress Notes (Signed)
Here to f/u u/s  Filed Vitals:   11/23/12 0859  BP: 120/80  Resp: 14   UT 8.0x5.2x6.5, no adnexal masses, ovaries not distinctly visualized, no free fluid, 3 fibroids largest 2.1cm, thin endometrium  A/P Nl u/s except small asymptomatic fibroids RTO for [facility] in 5yr and plan to do pap at that next [facility]

## 2012-11-30 ENCOUNTER — Other Ambulatory Visit (INDEPENDENT_AMBULATORY_CARE_PROVIDER_SITE_OTHER): Payer: Medicare Other

## 2012-11-30 ENCOUNTER — Encounter: Payer: Self-pay | Admitting: Internal Medicine

## 2012-11-30 ENCOUNTER — Ambulatory Visit (INDEPENDENT_AMBULATORY_CARE_PROVIDER_SITE_OTHER): Payer: Medicare Other | Admitting: Internal Medicine

## 2012-11-30 VITALS — BP 128/80 | HR 76 | Temp 97.7°F | Resp 10 | Ht 62.5 in | Wt 284.4 lb

## 2012-11-30 DIAGNOSIS — R202 Paresthesia of skin: Secondary | ICD-10-CM

## 2012-11-30 DIAGNOSIS — I10 Essential (primary) hypertension: Secondary | ICD-10-CM

## 2012-11-30 DIAGNOSIS — K760 Fatty (change of) liver, not elsewhere classified: Secondary | ICD-10-CM

## 2012-11-30 DIAGNOSIS — M1990 Unspecified osteoarthritis, unspecified site: Secondary | ICD-10-CM

## 2012-11-30 DIAGNOSIS — R209 Unspecified disturbances of skin sensation: Secondary | ICD-10-CM

## 2012-11-30 DIAGNOSIS — Z23 Encounter for immunization: Secondary | ICD-10-CM

## 2012-11-30 DIAGNOSIS — Z0000 Encounter for general adult medical examination without abnormal findings: Secondary | ICD-10-CM

## 2012-11-30 DIAGNOSIS — R7309 Other abnormal glucose: Secondary | ICD-10-CM

## 2012-11-30 DIAGNOSIS — M25569 Pain in unspecified knee: Secondary | ICD-10-CM

## 2012-11-30 DIAGNOSIS — K219 Gastro-esophageal reflux disease without esophagitis: Secondary | ICD-10-CM

## 2012-11-30 DIAGNOSIS — K7689 Other specified diseases of liver: Secondary | ICD-10-CM

## 2012-11-30 DIAGNOSIS — N3090 Cystitis, unspecified without hematuria: Secondary | ICD-10-CM

## 2012-11-30 LAB — HEPATIC FUNCTION PANEL
AST: 64 U/L — ABNORMAL HIGH (ref 0–37)
Albumin: 3.9 g/dL (ref 3.5–5.2)
Alkaline Phosphatase: 88 U/L (ref 39–117)
Total Bilirubin: 0.7 mg/dL (ref 0.3–1.2)

## 2012-11-30 LAB — BASIC METABOLIC PANEL
BUN: 14 mg/dL (ref 6–23)
CO2: 25 mEq/L (ref 19–32)
Chloride: 106 mEq/L (ref 96–112)
GFR: 122.68 mL/min (ref >60.00)
Glucose, Bld: 103 mg/dL — ABNORMAL HIGH (ref 70–99)
Potassium: 3.9 mEq/L (ref 3.5–5.1)
Sodium: 137 mEq/L (ref 135–145)

## 2012-11-30 LAB — URINALYSIS, ROUTINE W REFLEX MICROSCOPIC
Bilirubin Urine: NEGATIVE
Hgb urine dipstick: NEGATIVE
Ketones, ur: NEGATIVE
Total Protein, Urine: NEGATIVE
pH: 6 (ref 5.0–8.0)

## 2012-11-30 LAB — LIPID PANEL
Cholesterol: 149 mg/dL (ref 0–200)
HDL: 52.4 mg/dL (ref >39.00)
LDL Cholesterol: 79 mg/dL (ref 0–99)
VLDL: 17.8 mg/dL (ref 0.0–40.0)

## 2012-11-30 LAB — CBC WITH DIFFERENTIAL/PLATELET
Basophils Relative: 0.8 % (ref 0.0–3.0)
Eosinophils Relative: 3.1 % (ref 0.0–5.0)
Lymphocytes Relative: 31.7 % (ref 12.0–46.0)
MCV: 82.7 fl (ref 78.0–100.0)
Monocytes Absolute: 0.5 10*3/uL (ref 0.1–1.0)
Monocytes Relative: 10.8 % (ref 3.0–12.0)
Neutrophils Relative %: 53.6 % (ref 43.0–77.0)
Platelets: 202 10*3/uL (ref 150.0–400.0)
RBC: 4.86 Mil/uL (ref 3.87–5.11)
WBC: 4.4 10*3/uL — ABNORMAL LOW (ref 4.5–10.5)

## 2012-11-30 LAB — VITAMIN B12: Vitamin B-12: 460 pg/mL (ref 211–911)

## 2012-11-30 LAB — HEMOGLOBIN A1C: Hgb A1c MFr Bld: 6 % (ref 4.6–6.5)

## 2012-11-30 NOTE — Assessment & Plan Note (Signed)
UA

## 2012-11-30 NOTE — Assessment & Plan Note (Signed)
Wt loss  

## 2012-11-30 NOTE — Assessment & Plan Note (Signed)

## 2012-11-30 NOTE — Assessment & Plan Note (Signed)
Continue with current prescription therapy as reflected on the Med list.  

## 2012-11-30 NOTE — Assessment & Plan Note (Signed)
Handicapped form for [employer] filled out x 2 cars

## 2012-11-30 NOTE — Progress Notes (Signed)
  Subjective:    Patient ID: Samantha [name], female    DOB: 1951-02-22, 61 y.o.   MRN: [id number]  HPI  The patient is here for a wellness exam. The patient has been doing well overall without major physical or psychological issues going on lately. She saw her gyn.   F/u OA with neck pain, shoulders and knees; HTN, colon polyps - s/p colonoscopy C/o allergies  Review of Systems  Constitutional: Negative for chills, activity change, appetite change, fatigue and unexpected weight change.  HENT: Negative for congestion, mouth sores, postnasal drip and sinus pressure.   Eyes: Negative for visual disturbance.  Respiratory: Negative for cough and chest tightness.   Gastrointestinal: Negative for nausea and abdominal pain.  Genitourinary: Negative for frequency, difficulty urinating and vaginal pain.  Musculoskeletal: Positive for back pain (neck pain) and arthralgias. Negative for gait problem.  Skin: Negative for pallor and rash.  Neurological: Negative for dizziness, tremors, weakness, numbness and headaches.  Psychiatric/Behavioral: Negative for suicidal ideas, confusion and sleep disturbance.   Wt Readings from Last 3 Encounters:  11/30/12 284 lb 6.4 oz (129.003 kg)  11/23/12 286 lb (129.729 kg)  11/05/12 283 lb (128.368 kg)   BP Readings from Last 3 Encounters:  11/30/12 128/80  11/23/12 120/80  11/05/12 126/82        Objective:   Physical Exam  Constitutional: She appears well-developed. No distress.       obese  HENT:  Head: Normocephalic.  Right Ear: External ear normal.  Left Ear: External ear normal.  Nose: Nose normal.  Mouth/Throat: Oropharynx is clear and moist.  Eyes: Conjunctivae normal are normal. Pupils are equal, round, and reactive to light. Right eye exhibits no discharge. Left eye exhibits no discharge.  Neck: Normal range of motion. Neck supple. No JVD present. No tracheal deviation present. No thyromegaly present.  Cardiovascular: Normal rate, regular  rhythm and normal heart sounds.   Pulmonary/Chest: No stridor. No respiratory distress. She has no wheezes.  Abdominal: Soft. Bowel sounds are normal. She exhibits no distension and no mass. There is no tenderness. There is no rebound and no guarding.  Musculoskeletal: She exhibits tenderness. She exhibits no edema.       Neck and shoulders pain, B knees w/post-op changes  Lymphadenopathy:    She has no cervical adenopathy.  Neurological: She displays normal reflexes. No cranial nerve deficit. She exhibits normal muscle tone. Coordination normal.  Skin: No rash noted. No erythema.  Psychiatric: She has a normal mood and affect. Her behavior is normal. Judgment and thought content normal.   Limp  Lab Results  Component Value Date   WBC 5.2 05/16/2011   HGB 13.1 05/16/2011   HCT 39.0 05/16/2011   PLT 188.0 05/16/2011   GLUCOSE 89 07/28/2012   CHOL 134 05/16/2011   TRIG 70.0 05/16/2011   HDL 53.20 05/16/2011   LDLCALC 67 05/16/2011   ALT 72* 07/28/2012   AST 73* 07/28/2012   NA 140 07/28/2012   K 4.2 07/28/2012   CL 105 07/28/2012   CREATININE 0.7 07/28/2012   BUN 17 07/28/2012   CO2 27 07/28/2012   TSH 1.51 07/28/2012   INR 2.2* 03/17/2008   HGBA1C 5.9 03/05/2009        Assessment & Plan:

## 2012-11-30 NOTE — Assessment & Plan Note (Signed)
On Zantac OTC

## 2012-11-30 NOTE — Assessment & Plan Note (Signed)
Wt loss labs

## 2012-11-30 NOTE — Assessment & Plan Note (Signed)
Discussed.

## 2013-03-31 ENCOUNTER — Encounter: Payer: Self-pay | Admitting: Internal Medicine

## 2013-03-31 ENCOUNTER — Ambulatory Visit (INDEPENDENT_AMBULATORY_CARE_PROVIDER_SITE_OTHER): Payer: Medicare Other | Admitting: Internal Medicine

## 2013-03-31 VITALS — BP 130/90 | HR 68 | Temp 98.2°F | Resp 16 | Wt 285.0 lb

## 2013-03-31 DIAGNOSIS — K760 Fatty (change of) liver, not elsewhere classified: Secondary | ICD-10-CM

## 2013-03-31 DIAGNOSIS — K7689 Other specified diseases of liver: Secondary | ICD-10-CM

## 2013-03-31 DIAGNOSIS — K219 Gastro-esophageal reflux disease without esophagitis: Secondary | ICD-10-CM

## 2013-03-31 DIAGNOSIS — I10 Essential (primary) hypertension: Secondary | ICD-10-CM

## 2013-03-31 MED ORDER — RANITIDINE HCL 150 MG PO CAPS: 150.0000 mg | ORAL_CAPSULE | Freq: Two times a day (BID) | ORAL | Status: DC

## 2013-03-31 MED ORDER — LOSARTAN POTASSIUM 100 MG PO TABS: 100.0000 mg | ORAL_TABLET | Freq: Every day | ORAL | Status: DC

## 2013-03-31 MED ORDER — FUROSEMIDE 20 MG PO TABS: 20.0000 mg | ORAL_TABLET | Freq: Every day | ORAL | Status: DC

## 2013-03-31 MED ORDER — TRAMADOL HCL 50 MG PO TABS: 50.0000 mg | ORAL_TABLET | Freq: Two times a day (BID) | ORAL | Status: DC | PRN

## 2013-03-31 MED ORDER — AMLODIPINE BESYLATE 5 MG PO TABS: 5.0000 mg | ORAL_TABLET | Freq: Every day | ORAL | Status: DC

## 2013-03-31 MED ORDER — POTASSIUM CHLORIDE CRYS ER 20 MEQ PO TBCR: 20.0000 meq | EXTENDED_RELEASE_TABLET | Freq: Every day | ORAL | Status: DC

## 2013-03-31 NOTE — Progress Notes (Signed)
   Subjective:    HPI  The patient is here for a wellness exam. The patient has been doing well overall without major physical or psychological issues going on lately. She saw her gyn.   F/u OA with neck pain, shoulders and knees; HTN, colon polyps - s/p colonoscopy C/o allergies  Review of Systems  Constitutional: Negative for chills, activity change, appetite change, fatigue and unexpected weight change.  HENT: Negative for congestion, mouth sores, postnasal drip and sinus pressure.   Eyes: Negative for visual disturbance.  Respiratory: Negative for cough and chest tightness.   Gastrointestinal: Negative for nausea and abdominal pain.  Genitourinary: Negative for frequency, difficulty urinating and vaginal pain.  Musculoskeletal: Positive for back pain (neck pain) and arthralgias. Negative for gait problem.  Skin: Negative for pallor and rash.  Neurological: Negative for dizziness, tremors, weakness, numbness and headaches.  Psychiatric/Behavioral: Negative for suicidal ideas, confusion and sleep disturbance.   Wt Readings from Last 3 Encounters:  03/31/13 285 lb (129.275 kg)  11/30/12 284 lb 6.4 oz (129.003 kg)  11/23/12 286 lb (129.729 kg)   BP Readings from Last 3 Encounters:  03/31/13 130/90  11/30/12 128/80  11/23/12 120/80        Objective:   Physical Exam  Constitutional: She appears well-developed. No distress.  obese  HENT:  Head: Normocephalic.  Right Ear: External ear normal.  Left Ear: External ear normal.  Nose: Nose normal.  Mouth/Throat: Oropharynx is clear and moist.  Eyes: Conjunctivae are normal. Pupils are equal, round, and reactive to light. Right eye exhibits no discharge. Left eye exhibits no discharge.  Neck: Normal range of motion. Neck supple. No JVD present. No tracheal deviation present. No thyromegaly present.  Cardiovascular: Normal rate, regular rhythm and normal heart sounds.   Pulmonary/Chest: No stridor. No respiratory distress. She  has no wheezes.  Abdominal: Soft. Bowel sounds are normal. She exhibits no distension and no mass. There is no tenderness. There is no rebound and no guarding.  Musculoskeletal: She exhibits tenderness. She exhibits no edema.  Neck and shoulders pain, B knees w/post-op changes  Lymphadenopathy:    She has no cervical adenopathy.  Neurological: She displays normal reflexes. No cranial nerve deficit. She exhibits normal muscle tone. Coordination normal.  Skin: No rash noted. No erythema.  Psychiatric: She has a normal mood and affect. Her behavior is normal. Judgment and thought content normal.   Limp  Lab Results  Component Value Date   WBC 4.4* 11/30/2012   HGB 13.0 11/30/2012   HCT 40.2 11/30/2012   PLT 202.0 11/30/2012   GLUCOSE 103* 11/30/2012   CHOL 149 11/30/2012   TRIG 89.0 11/30/2012   HDL 52.40 11/30/2012   LDLCALC 79 11/30/2012   ALT 65* 11/30/2012   AST 64* 11/30/2012   NA 137 11/30/2012   K 3.9 11/30/2012   CL 106 11/30/2012   CREATININE 0.6 11/30/2012   BUN 14 11/30/2012   CO2 25 11/30/2012   TSH 1.36 11/30/2012   INR 2.2* 03/17/2008   HGBA1C 6.0 11/30/2012        Assessment & Plan:

## 2013-03-31 NOTE — Patient Instructions (Signed)
[email or web address]  

## 2013-03-31 NOTE — Assessment & Plan Note (Signed)
Continue with current prescription therapy as reflected on the Med list.  

## 2013-03-31 NOTE — Assessment & Plan Note (Signed)
Chronic  On Zantac OTC

## 2013-03-31 NOTE — Assessment & Plan Note (Signed)
LFT Wt loss

## 2013-04-05 ENCOUNTER — Other Ambulatory Visit: Payer: Self-pay

## 2013-04-05 DIAGNOSIS — Z1231 Encounter for screening mammogram for malignant neoplasm of breast: Secondary | ICD-10-CM

## 2013-05-10 ENCOUNTER — Ambulatory Visit
Admission: RE | Admit: 2013-05-10 | Discharge: 2013-05-10 | Disposition: A | Discharge status: Home / Self Care | Payer: Medicare Other | Source: Ambulatory Visit

## 2013-05-10 DIAGNOSIS — Z1231 Encounter for screening mammogram for malignant neoplasm of breast: Secondary | ICD-10-CM

## 2013-07-08 ENCOUNTER — Other Ambulatory Visit: Payer: Medicare Other

## 2013-07-29 ENCOUNTER — Other Ambulatory Visit (INDEPENDENT_AMBULATORY_CARE_PROVIDER_SITE_OTHER): Payer: Medicare Other

## 2013-07-29 DIAGNOSIS — I10 Essential (primary) hypertension: Secondary | ICD-10-CM

## 2013-07-29 DIAGNOSIS — K7689 Other specified diseases of liver: Secondary | ICD-10-CM

## 2013-07-29 DIAGNOSIS — K760 Fatty (change of) liver, not elsewhere classified: Secondary | ICD-10-CM

## 2013-07-29 DIAGNOSIS — K219 Gastro-esophageal reflux disease without esophagitis: Secondary | ICD-10-CM

## 2013-07-29 LAB — BASIC METABOLIC PANEL
BUN: 12 mg/dL (ref 6–23)
Creatinine, Ser: 0.8 mg/dL (ref 0.4–1.2)
GFR: 92.93 mL/min (ref >60.00)
Glucose, Bld: 89 mg/dL (ref 70–99)
Potassium: 4.3 mEq/L (ref 3.5–5.1)

## 2013-07-29 LAB — HEPATIC FUNCTION PANEL
AST: 63 U/L — ABNORMAL HIGH (ref 0–37)
Albumin: 3.6 g/dL (ref 3.5–5.2)
Alkaline Phosphatase: 79 U/L (ref 39–117)
Total Protein: 7.5 g/dL (ref 6.0–8.3)

## 2013-07-29 LAB — URINALYSIS, ROUTINE W REFLEX MICROSCOPIC
Bilirubin Urine: NEGATIVE
Ketones, ur: NEGATIVE
Urine Glucose: NEGATIVE
Urobilinogen, UA: 0.2 (ref 0.0–1.0)

## 2013-07-29 LAB — TSH: TSH: 1.28 u[IU]/mL (ref 0.35–5.50)

## 2013-08-04 ENCOUNTER — Ambulatory Visit (INDEPENDENT_AMBULATORY_CARE_PROVIDER_SITE_OTHER): Payer: Medicare Other | Admitting: Internal Medicine

## 2013-08-04 ENCOUNTER — Encounter: Payer: Self-pay | Admitting: Internal Medicine

## 2013-08-04 VITALS — BP 140/80 | HR 76 | Temp 98.1°F | Resp 16 | Wt 280.0 lb

## 2013-08-04 DIAGNOSIS — K760 Fatty (change of) liver, not elsewhere classified: Secondary | ICD-10-CM

## 2013-08-04 DIAGNOSIS — K7689 Other specified diseases of liver: Secondary | ICD-10-CM

## 2013-08-04 DIAGNOSIS — N3090 Cystitis, unspecified without hematuria: Secondary | ICD-10-CM | POA: Insufficient documentation

## 2013-08-04 DIAGNOSIS — M545 Low back pain: Secondary | ICD-10-CM

## 2013-08-04 DIAGNOSIS — I10 Essential (primary) hypertension: Secondary | ICD-10-CM

## 2013-08-04 MED ORDER — RANITIDINE HCL 150 MG PO CAPS: 150.0000 mg | ORAL_CAPSULE | Freq: Two times a day (BID) | ORAL | Status: DC

## 2013-08-04 MED ORDER — CIPROFLOXACIN HCL 250 MG PO TABS: 250.0000 mg | ORAL_TABLET | Freq: Two times a day (BID) | ORAL | Status: DC

## 2013-08-04 MED ORDER — LOSARTAN POTASSIUM 100 MG PO TABS: 100.0000 mg | ORAL_TABLET | Freq: Every day | ORAL | Status: DC

## 2013-08-04 MED ORDER — FUROSEMIDE 20 MG PO TABS: 20.0000 mg | ORAL_TABLET | Freq: Every day | ORAL | Status: DC

## 2013-08-04 MED ORDER — TRAMADOL HCL 50 MG PO TABS: 50.0000 mg | ORAL_TABLET | Freq: Two times a day (BID) | ORAL | Status: DC | PRN

## 2013-08-04 MED ORDER — POTASSIUM CHLORIDE CRYS ER 20 MEQ PO TBCR: 20.0000 meq | EXTENDED_RELEASE_TABLET | Freq: Every day | ORAL | Status: DC

## 2013-08-04 MED ORDER — AMLODIPINE BESYLATE 5 MG PO TABS: 5.0000 mg | ORAL_TABLET | Freq: Every day | ORAL | Status: DC

## 2013-08-04 NOTE — Assessment & Plan Note (Signed)
Continue with current prescription therapy as reflected on the Med list.  

## 2013-08-04 NOTE — Assessment & Plan Note (Signed)
Better  

## 2013-08-04 NOTE — Assessment & Plan Note (Signed)
8/14 recurrent, asymptomatic Cipro Rx

## 2013-08-04 NOTE — Assessment & Plan Note (Signed)
Wt loss Watching LFTs

## 2013-08-04 NOTE — Progress Notes (Signed)
   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  HENT: Negative for congestion, mouth sores, postnasal drip and sinus pressure.   Eyes: Negative for visual disturbance.  Respiratory: Negative for cough and chest tightness.   Gastrointestinal: Negative for nausea and abdominal pain.  Genitourinary: Negative for frequency, difficulty urinating and vaginal pain.  Musculoskeletal: Positive for back pain (neck pain) and arthralgias. Negative for gait problem.  Skin: Negative for pallor and rash.  Neurological: Negative for dizziness, tremors, weakness, numbness and headaches.  Psychiatric/Behavioral: Negative for suicidal ideas, confusion and sleep disturbance.   Wt Readings from Last 3 Encounters:  08/04/13 280 lb (127.007 kg)  03/31/13 285 lb (129.275 kg)  11/30/12 284 lb 6.4 oz (129.003 kg)   BP Readings from Last 3 Encounters:  08/04/13 140/80  03/31/13 130/90  11/30/12 128/80        Objective:   Physical Exam  Constitutional: She appears well-developed. No distress.  obese  HENT:  Head: Normocephalic.  Right Ear: External ear normal.  Left Ear: External ear normal.  Nose: Nose normal.  Mouth/Throat: Oropharynx is clear and moist.  Eyes: Conjunctivae are normal. Pupils are equal, round, and reactive to light. Right eye exhibits no discharge. Left eye exhibits no discharge.  Neck: Normal range of motion. Neck supple. No JVD present. No tracheal deviation present. No thyromegaly present.  Cardiovascular: Normal rate, regular rhythm and normal heart sounds.   Pulmonary/Chest: No stridor. No respiratory distress. She has no wheezes.  Abdominal: Soft. Bowel sounds are normal. She exhibits no distension  and no mass. There is no tenderness. There is no rebound and no guarding.  Musculoskeletal: She exhibits tenderness. She exhibits no edema.  Neck and shoulders pain, B knees w/post-op changes  Lymphadenopathy:    She has no cervical adenopathy.  Neurological: She displays normal reflexes. No cranial nerve deficit. She exhibits normal muscle tone. Coordination normal.  Skin: No rash noted. No erythema.  Psychiatric: She has a normal mood and affect. Her behavior is normal. Judgment and thought content normal.   Limp  Lab Results  Component Value Date   WBC 4.4* 11/30/2012   HGB 13.0 11/30/2012   HCT 40.2 11/30/2012   PLT 202.0 11/30/2012   GLUCOSE 89 07/29/2013   CHOL 149 11/30/2012   TRIG 89.0 11/30/2012   HDL 52.40 11/30/2012   LDLCALC 79 11/30/2012   ALT 66* 07/29/2013   AST 63* 07/29/2013   NA 140 07/29/2013   K 4.3 07/29/2013   CL 108 07/29/2013   CREATININE 0.8 07/29/2013   BUN 12 07/29/2013   CO2 26 07/29/2013   TSH 1.28 07/29/2013   INR 2.2* 03/17/2008   HGBA1C 6.0 11/30/2012        Assessment & Plan:

## 2013-09-06 ENCOUNTER — Ambulatory Visit (INDEPENDENT_AMBULATORY_CARE_PROVIDER_SITE_OTHER): Payer: Medicare Other

## 2013-09-06 DIAGNOSIS — Z23 Encounter for immunization: Secondary | ICD-10-CM

## 2013-10-14 DIAGNOSIS — Z0279 Encounter for issue of other medical certificate: Secondary | ICD-10-CM

## 2014-02-06 ENCOUNTER — Encounter: Payer: Self-pay | Admitting: Internal Medicine

## 2014-02-06 ENCOUNTER — Ambulatory Visit (INDEPENDENT_AMBULATORY_CARE_PROVIDER_SITE_OTHER): Payer: Medicare Other | Admitting: Internal Medicine

## 2014-02-06 ENCOUNTER — Other Ambulatory Visit (INDEPENDENT_AMBULATORY_CARE_PROVIDER_SITE_OTHER): Payer: Medicare Other

## 2014-02-06 VITALS — BP 130/80 | HR 72 | Temp 98.7°F | Resp 16 | Wt 281.0 lb

## 2014-02-06 DIAGNOSIS — M545 Low back pain, unspecified: Secondary | ICD-10-CM

## 2014-02-06 DIAGNOSIS — R7309 Other abnormal glucose: Secondary | ICD-10-CM

## 2014-02-06 DIAGNOSIS — I10 Essential (primary) hypertension: Secondary | ICD-10-CM

## 2014-02-06 DIAGNOSIS — N3090 Cystitis, unspecified without hematuria: Secondary | ICD-10-CM

## 2014-02-06 DIAGNOSIS — K219 Gastro-esophageal reflux disease without esophagitis: Secondary | ICD-10-CM

## 2014-02-06 DIAGNOSIS — D126 Benign neoplasm of colon, unspecified: Secondary | ICD-10-CM

## 2014-02-06 LAB — HEPATIC FUNCTION PANEL
ALBUMIN: 3.8 g/dL (ref 3.5–5.2)
ALK PHOS: 82 U/L (ref 39–117)
ALT: 76 U/L — ABNORMAL HIGH (ref 0–35)
AST: 71 U/L — ABNORMAL HIGH (ref 0–37)
Bilirubin, Direct: 0.1 mg/dL (ref 0.0–0.3)
TOTAL PROTEIN: 7.9 g/dL (ref 6.0–8.3)
Total Bilirubin: 0.9 mg/dL (ref 0.3–1.2)

## 2014-02-06 LAB — URINALYSIS, ROUTINE W REFLEX MICROSCOPIC
BILIRUBIN URINE: NEGATIVE
Hgb urine dipstick: NEGATIVE
Ketones, ur: NEGATIVE
NITRITE: NEGATIVE
PH: 6 (ref 5.0–8.0)
RBC / HPF: NONE SEEN (ref 0–?)
Specific Gravity, Urine: 1.025 (ref 1.000–1.030)
Total Protein, Urine: NEGATIVE
UROBILINOGEN UA: 0.2 (ref 0.0–1.0)
Urine Glucose: NEGATIVE

## 2014-02-06 LAB — BASIC METABOLIC PANEL
BUN: 11 mg/dL (ref 6–23)
CO2: 25 mEq/L (ref 19–32)
CREATININE: 0.7 mg/dL (ref 0.4–1.2)
Calcium: 9.3 mg/dL (ref 8.4–10.5)
Chloride: 105 mEq/L (ref 96–112)
GFR: 101.5 mL/min (ref >60.00)
Glucose, Bld: 93 mg/dL (ref 70–99)
POTASSIUM: 3.6 meq/L (ref 3.5–5.1)
Sodium: 138 mEq/L (ref 135–145)

## 2014-02-06 LAB — HEMOGLOBIN A1C: HEMOGLOBIN A1C: 5.9 % (ref 4.6–6.5)

## 2014-02-06 MED ORDER — POTASSIUM CHLORIDE CRYS ER 20 MEQ PO TBCR: 20.0000 meq | EXTENDED_RELEASE_TABLET | Freq: Every day | ORAL | Status: DC

## 2014-02-06 MED ORDER — LOSARTAN POTASSIUM 100 MG PO TABS: 100.0000 mg | ORAL_TABLET | Freq: Every day | ORAL | Status: DC

## 2014-02-06 MED ORDER — CIPROFLOXACIN HCL 250 MG PO TABS: 250.0000 mg | ORAL_TABLET | Freq: Two times a day (BID) | ORAL | Status: DC

## 2014-02-06 MED ORDER — AMLODIPINE BESYLATE 5 MG PO TABS: 5.0000 mg | ORAL_TABLET | Freq: Every day | ORAL | Status: DC

## 2014-02-06 MED ORDER — FUROSEMIDE 20 MG PO TABS: 20.0000 mg | ORAL_TABLET | Freq: Every day | ORAL | Status: DC

## 2014-02-06 MED ORDER — RANITIDINE HCL 150 MG PO CAPS: 150.0000 mg | ORAL_CAPSULE | Freq: Two times a day (BID) | ORAL | Status: DC

## 2014-02-06 NOTE — Assessment & Plan Note (Signed)
Continue with current prescription therapy as reflected on the Med list. Wt Readings from Last 3 Encounters:  02/06/14 281 lb (127.461 kg)  08/04/13 280 lb (127.007 kg)  03/31/13 285 lb (129.275 kg)

## 2014-02-06 NOTE — Progress Notes (Signed)
Pre visit review using our clinic review tool, if applicable. No additional management support is needed unless otherwise documented below in the visit note. 

## 2014-02-06 NOTE — Assessment & Plan Note (Signed)
Continue with current prescription therapy as reflected on the Med list.  

## 2014-02-06 NOTE — Progress Notes (Signed)
Patient ID: Samantha [name], female   DOB: [birth year], 63 y.o.   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  HENT: Negative for congestion, mouth sores, postnasal drip and sinus pressure.   Eyes: Negative for visual disturbance.  Respiratory: Negative for cough and chest tightness.   Gastrointestinal: Negative for nausea and abdominal pain.  Genitourinary: Negative for frequency, difficulty urinating and vaginal pain.  Musculoskeletal: Positive for arthralgias and back pain (neck pain). Negative for gait problem.  Skin: Negative for pallor and rash.  Neurological: Negative for dizziness, tremors, weakness, numbness and headaches.  Psychiatric/Behavioral: Negative for suicidal ideas, confusion and sleep disturbance.   Wt Readings from Last 3 Encounters:  02/06/14 281 lb (127.461 kg)  08/04/13 280 lb (127.007 kg)  03/31/13 285 lb (129.275 kg)   BP Readings from Last 3 Encounters:  02/06/14 130/80  08/04/13 140/80  03/31/13 130/90        Objective:   Physical Exam  Constitutional: She appears well-developed. No distress.  obese  HENT:  Head: Normocephalic.  Right Ear: External ear normal.  Left Ear: External ear normal.  Nose: Nose normal.  Mouth/Throat: Oropharynx is clear and moist.  Eyes: Conjunctivae are normal. Pupils are equal, round, and reactive to light. Right eye exhibits no discharge. Left eye exhibits no discharge.  Neck: Normal range of motion. Neck supple. No JVD present. No tracheal deviation present. No thyromegaly present.  Cardiovascular: Normal rate, regular rhythm and normal heart sounds.   Pulmonary/Chest: No stridor. No respiratory distress. She has no wheezes.   Abdominal: Soft. Bowel sounds are normal. She exhibits no distension and no mass. There is no tenderness. There is no rebound and no guarding.  Musculoskeletal: She exhibits tenderness. She exhibits no edema.  Neck and shoulders pain, B knees w/post-op changes  Lymphadenopathy:    She has no cervical adenopathy.  Neurological: She displays normal reflexes. No cranial nerve deficit. She exhibits normal muscle tone. Coordination normal.  Skin: No rash noted. No erythema.  Psychiatric: She has a normal mood and affect. Her behavior is normal. Judgment and thought content normal.   Limp  Lab Results  Component Value Date   WBC 4.4* 11/30/2012   HGB 13.0 11/30/2012   HCT 40.2 11/30/2012   PLT 202.0 11/30/2012   GLUCOSE 89 07/29/2013   CHOL 149 11/30/2012   TRIG 89.0 11/30/2012   HDL 52.40 11/30/2012   LDLCALC 79 11/30/2012   ALT 66* 07/29/2013   AST 63* 07/29/2013   NA 140 07/29/2013   K 4.3 07/29/2013   CL 108 07/29/2013   CREATININE 0.8 07/29/2013   BUN 12 07/29/2013   CO2 26 07/29/2013   TSH 1.28 07/29/2013   INR 2.2* 03/17/2008   HGBA1C 6.0 11/30/2012        Assessment & Plan:

## 2014-02-06 NOTE — Assessment & Plan Note (Signed)
UA See Rx

## 2014-02-06 NOTE — Assessment & Plan Note (Signed)
UA See RX

## 2014-02-06 NOTE — Assessment & Plan Note (Signed)
Due colon 12/15

## 2014-04-04 ENCOUNTER — Other Ambulatory Visit: Payer: Self-pay

## 2014-04-04 DIAGNOSIS — Z1231 Encounter for screening mammogram for malignant neoplasm of breast: Secondary | ICD-10-CM

## 2014-05-12 ENCOUNTER — Ambulatory Visit
Admission: RE | Admit: 2014-05-12 | Discharge: 2014-05-12 | Disposition: A | Discharge status: Home / Self Care | Payer: Medicare Other | Source: Ambulatory Visit

## 2014-05-12 ENCOUNTER — Encounter (INDEPENDENT_AMBULATORY_CARE_PROVIDER_SITE_OTHER): Payer: Self-pay

## 2014-05-12 DIAGNOSIS — Z1231 Encounter for screening mammogram for malignant neoplasm of breast: Secondary | ICD-10-CM

## 2014-08-04 ENCOUNTER — Other Ambulatory Visit: Payer: Medicare Other

## 2014-08-08 ENCOUNTER — Encounter: Payer: Self-pay | Admitting: Internal Medicine

## 2014-08-08 ENCOUNTER — Ambulatory Visit (INDEPENDENT_AMBULATORY_CARE_PROVIDER_SITE_OTHER): Payer: Medicare Other | Admitting: Internal Medicine

## 2014-08-08 ENCOUNTER — Other Ambulatory Visit (INDEPENDENT_AMBULATORY_CARE_PROVIDER_SITE_OTHER): Payer: Medicare Other

## 2014-08-08 VITALS — BP 131/91 | HR 67 | Temp 98.3°F | Ht 62.5 in | Wt 281.0 lb

## 2014-08-08 DIAGNOSIS — M545 Low back pain, unspecified: Secondary | ICD-10-CM

## 2014-08-08 DIAGNOSIS — R7309 Other abnormal glucose: Secondary | ICD-10-CM

## 2014-08-08 DIAGNOSIS — R740 Nonspecific elevation of levels of transaminase and lactic acid dehydrogenase [LDH]: Secondary | ICD-10-CM

## 2014-08-08 DIAGNOSIS — Z0000 Encounter for general adult medical examination without abnormal findings: Secondary | ICD-10-CM

## 2014-08-08 DIAGNOSIS — K760 Fatty (change of) liver, not elsewhere classified: Secondary | ICD-10-CM

## 2014-08-08 DIAGNOSIS — I10 Essential (primary) hypertension: Secondary | ICD-10-CM

## 2014-08-08 DIAGNOSIS — K7689 Other specified diseases of liver: Secondary | ICD-10-CM

## 2014-08-08 DIAGNOSIS — D126 Benign neoplasm of colon, unspecified: Secondary | ICD-10-CM

## 2014-08-08 DIAGNOSIS — M25569 Pain in unspecified knee: Secondary | ICD-10-CM

## 2014-08-08 DIAGNOSIS — R7402 Elevation of levels of lactic acid dehydrogenase (LDH): Secondary | ICD-10-CM

## 2014-08-08 LAB — CBC WITH DIFFERENTIAL/PLATELET
Basophils Absolute: 0.1 10*3/uL (ref 0.0–0.1)
Basophils Relative: 1.7 % (ref 0.0–3.0)
EOS ABS: 0.1 10*3/uL (ref 0.0–0.7)
Eosinophils Relative: 3 % (ref 0.0–5.0)
HCT: 39.6 % (ref 36.0–46.0)
Hemoglobin: 12.8 g/dL (ref 12.0–15.0)
LYMPHS PCT: 33.8 % (ref 12.0–46.0)
Lymphs Abs: 1.6 10*3/uL (ref 0.7–4.0)
MCHC: 32.3 g/dL (ref 30.0–36.0)
MCV: 83.6 fl (ref 78.0–100.0)
Monocytes Absolute: 0.5 10*3/uL (ref 0.1–1.0)
Monocytes Relative: 11.3 % (ref 3.0–12.0)
NEUTROS PCT: 50.2 % (ref 43.0–77.0)
Neutro Abs: 2.4 10*3/uL (ref 1.4–7.7)
Platelets: 189 10*3/uL (ref 150.0–400.0)
RBC: 4.74 Mil/uL (ref 3.87–5.11)
RDW: 14.4 % (ref 11.5–15.5)
WBC: 4.7 10*3/uL (ref 4.0–10.5)

## 2014-08-08 LAB — LIPID PANEL
CHOLESTEROL: 153 mg/dL (ref 0–200)
HDL: 52.9 mg/dL (ref >39.00)
LDL CALC: 82 mg/dL (ref 0–99)
NonHDL: 100.1
Total CHOL/HDL Ratio: 3
Triglycerides: 91 mg/dL (ref 0.0–149.0)
VLDL: 18.2 mg/dL (ref 0.0–40.0)

## 2014-08-08 LAB — URINALYSIS, ROUTINE W REFLEX MICROSCOPIC
Bilirubin Urine: NEGATIVE
HGB URINE DIPSTICK: NEGATIVE
Ketones, ur: NEGATIVE
NITRITE: NEGATIVE
PH: 6 (ref 5.0–8.0)
Specific Gravity, Urine: 1.02 (ref 1.000–1.030)
TOTAL PROTEIN, URINE-UPE24: NEGATIVE
Urine Glucose: NEGATIVE
Urobilinogen, UA: 0.2 (ref 0.0–1.0)

## 2014-08-08 LAB — HEPATIC FUNCTION PANEL
ALT: 66 U/L — ABNORMAL HIGH (ref 0–35)
AST: 62 U/L — ABNORMAL HIGH (ref 0–37)
Albumin: 3.8 g/dL (ref 3.5–5.2)
Alkaline Phosphatase: 87 U/L (ref 39–117)
BILIRUBIN DIRECT: 0.1 mg/dL (ref 0.0–0.3)
BILIRUBIN TOTAL: 0.6 mg/dL (ref 0.2–1.2)
Total Protein: 8 g/dL (ref 6.0–8.3)

## 2014-08-08 LAB — BASIC METABOLIC PANEL
BUN: 16 mg/dL (ref 6–23)
CO2: 22 mEq/L (ref 19–32)
Calcium: 9.4 mg/dL (ref 8.4–10.5)
Chloride: 105 mEq/L (ref 96–112)
Creatinine, Ser: 0.9 mg/dL (ref 0.4–1.2)
GFR: 82.98 mL/min (ref >60.00)
GLUCOSE: 95 mg/dL (ref 70–99)
Potassium: 4.2 mEq/L (ref 3.5–5.1)
Sodium: 140 mEq/L (ref 135–145)

## 2014-08-08 LAB — TSH: TSH: 1.11 u[IU]/mL (ref 0.35–4.50)

## 2014-08-08 LAB — HEMOGLOBIN A1C: HEMOGLOBIN A1C: 5.9 % (ref 4.6–6.5)

## 2014-08-08 MED ORDER — RANITIDINE HCL 150 MG PO CAPS
150.0000 mg | ORAL_CAPSULE | Freq: Two times a day (BID) | ORAL | Status: DC
Start: 2014-08-08 — End: 2015-04-13

## 2014-08-08 MED ORDER — AMLODIPINE BESYLATE 5 MG PO TABS: 5.0000 mg | ORAL_TABLET | Freq: Every day | ORAL | Status: DC

## 2014-08-08 MED ORDER — POTASSIUM CHLORIDE CRYS ER 20 MEQ PO TBCR: 20.0000 meq | EXTENDED_RELEASE_TABLET | Freq: Every day | ORAL | Status: DC

## 2014-08-08 MED ORDER — FUROSEMIDE 20 MG PO TABS: 20.0000 mg | ORAL_TABLET | Freq: Every day | ORAL | Status: DC

## 2014-08-08 MED ORDER — LOSARTAN POTASSIUM 100 MG PO TABS: 100.0000 mg | ORAL_TABLET | Freq: Every day | ORAL | Status: DC

## 2014-08-08 NOTE — Assessment & Plan Note (Signed)
Here for medicare wellness/physical  Diet: heart healthy  Physical activity: sedentary at home; water aerobics Depression/mood screen: negative  Hearing: intact to whispered voice  Visual acuity: grossly normal, performs annual eye exam  ADLs: capable  Fall risk: none  Home safety: good  Cognitive evaluation: intact to orientation, naming, recall and repetition  EOL planning: adv directives, full code/ I agree  I have personally reviewed and have noted  1. The patient's medical and social history  2. Their use of alcohol, tobacco or illicit drugs  3. Their current medications and supplements  4. The patient's functional ability including ADL's, fall risks, home safety risks and hearing or visual impairment.  5. Diet and physical activities  6. Evidence for depression or mood disorders    Today patient counseled on age appropriate routine health concerns for screening and prevention, each reviewed and up to date or declined. Immunizations reviewed and up to date or declined. Labs ordered and reviewed. Risk factors for depression reviewed and negative. Hearing function and visual acuity are intact. ADLs screened and addressed as needed. Functional ability and level of safety reviewed and appropriate. Education, counseling and referrals performed based on assessed risks today. Patient provided with a copy of personalized plan for preventive services.

## 2014-08-08 NOTE — Progress Notes (Signed)
   Subjective:    HPI    F/u OA with neck pain, shoulders and knees; HTN, colon polyps - s/p colonoscopy  F/u allergies  Review of Systems  Constitutional: Negative for chills, activity change, appetite change, fatigue and unexpected weight change.  HENT: Negative for congestion, mouth sores, postnasal drip and sinus pressure.   Eyes: Negative for visual disturbance.  Respiratory: Negative for cough and chest tightness.   Gastrointestinal: Negative for nausea and abdominal pain.  Genitourinary: Negative for frequency, difficulty urinating and vaginal pain.  Musculoskeletal: Positive for arthralgias and back pain (neck pain). Negative for gait problem.  Skin: Negative for pallor and rash.  Neurological: Negative for dizziness, tremors, weakness, numbness and headaches.  Psychiatric/Behavioral: Negative for suicidal ideas, confusion and sleep disturbance.   Wt Readings from Last 3 Encounters:  08/08/14 281 lb (127.461 kg)  02/06/14 281 lb (127.461 kg)  08/04/13 280 lb (127.007 kg)   BP Readings from Last 3 Encounters:  08/08/14 131/91  02/06/14 130/80  08/04/13 140/80        Objective:   Physical Exam  Constitutional: She appears well-developed. No distress.  obese  HENT:  Head: Normocephalic.  Right Ear: External ear normal.  Left Ear: External ear normal.  Nose: Nose normal.  Mouth/Throat: Oropharynx is clear and moist.  Eyes: Conjunctivae are normal. Pupils are equal, round, and reactive to light. Right eye exhibits no discharge. Left eye exhibits no discharge.  Neck: Normal range of motion. Neck supple. No JVD present. No tracheal deviation present. No thyromegaly present.  Cardiovascular: Normal rate, regular rhythm and normal heart sounds.   Pulmonary/Chest: No stridor. No respiratory distress. She has no wheezes.  Abdominal: Soft. Bowel sounds are normal. She exhibits no distension and no mass. There is no tenderness. There is no rebound and no guarding.   Musculoskeletal: She exhibits tenderness. She exhibits no edema.  Neck and shoulders pain, B knees w/post-op changes  Lymphadenopathy:    She has no cervical adenopathy.  Neurological: She displays normal reflexes. No cranial nerve deficit. She exhibits normal muscle tone. Coordination normal.  Skin: No rash noted. No erythema.  Psychiatric: She has a normal mood and affect. Her behavior is normal. Judgment and thought content normal.   Limp  Lab Results  Component Value Date   WBC 4.4* 11/30/2012   HGB 13.0 11/30/2012   HCT 40.2 11/30/2012   PLT 202.0 11/30/2012   GLUCOSE 93 02/06/2014   CHOL 149 11/30/2012   TRIG 89.0 11/30/2012   HDL 52.40 11/30/2012   LDLCALC 79 11/30/2012   ALT 76* 02/06/2014   AST 71* 02/06/2014   NA 138 02/06/2014   K 3.6 02/06/2014   CL 105 02/06/2014   CREATININE 0.7 02/06/2014   BUN 11 02/06/2014   CO2 25 02/06/2014   TSH 1.28 07/29/2013   INR 2.2* 03/17/2008   HGBA1C 5.9 02/06/2014        Assessment & Plan:

## 2014-08-08 NOTE — Patient Instructions (Signed)
Preventive Care for Adults A healthy lifestyle and preventive care can promote health and wellness. Preventive health guidelines for women include the following key practices.  A routine yearly physical is a good way to check with your health care provider about your health and preventive screening. It is a chance to share any concerns and updates on your health and to receive a thorough exam.  Visit your dentist for a routine exam and preventive care every 6 months. Brush your teeth twice a day and floss once a day. Good oral hygiene prevents tooth decay and gum disease.  The frequency of eye exams is based on your age, health, family medical history, use of contact lenses, and other factors. Follow your health care provider's recommendations for frequency of eye exams.  Eat a healthy diet. Foods like vegetables, fruits, whole grains, low-fat dairy products, and lean protein foods contain the nutrients you need without too many calories. Decrease your intake of foods high in solid fats, added sugars, and salt. Eat the right amount of calories for you.Get information about a proper diet from your health care provider, if necessary.  Regular physical exercise is one of the most important things you can do for your health. Most adults should get at least 150 minutes of moderate-intensity exercise (any activity that increases your heart rate and causes you to sweat) each week. In addition, most adults need muscle-strengthening exercises on 2 or more days a week.  Maintain a healthy weight. The body mass index (BMI) is a screening tool to identify possible weight problems. It provides an estimate of body fat based on height and weight. Your health care provider can find your BMI and can help you achieve or maintain a healthy weight.For adults 20 years and older:  A BMI below 18.5 is considered underweight.  A BMI of 18.5 to 24.9 is normal.  A BMI of 25 to 29.9 is considered overweight.  A BMI of  30 and above is considered obese.  Maintain normal blood lipids and cholesterol levels by exercising and minimizing your intake of saturated fat. Eat a balanced diet with plenty of fruit and vegetables. Blood tests for lipids and cholesterol should begin at age 76 and be repeated every 5 years. If your lipid or cholesterol levels are high, you are over 50, or you are at high risk for heart disease, you may need your cholesterol levels checked more frequently.Ongoing high lipid and cholesterol levels should be treated with medicines if diet and exercise are not working.  If you smoke, find out from your health care provider how to quit. If you do not use tobacco, do not start.  Lung cancer screening is recommended for adults aged 22-80 years who are at high risk for developing lung cancer because of a history of smoking. A yearly low-dose CT scan of the lungs is recommended for people who have at least a 30-pack-year history of smoking and are a current smoker or have quit within the past 15 years. A pack year of smoking is smoking an average of 1 pack of cigarettes a day for 1 year (for example: 1 pack a day for 30 years or 2 packs a day for 15 years). Yearly screening should continue until the smoker has stopped smoking for at least 15 years. Yearly screening should be stopped for people who develop a health problem that would prevent them from having lung cancer treatment.  If you are pregnant, do not drink alcohol. If you are breastfeeding,  be very cautious about drinking alcohol. If you are not pregnant and choose to drink alcohol, do not have more than 1 drink per day. One drink is considered to be 12 ounces (355 mL) of beer, 5 ounces (148 mL) of wine, or 1.5 ounces (44 mL) of liquor.  Avoid use of street drugs. Do not share needles with anyone. Ask for help if you need support or instructions about stopping the use of drugs.  High blood pressure causes heart disease and increases the risk of  stroke. Your blood pressure should be checked at least every 1 to 2 years. Ongoing high blood pressure should be treated with medicines if weight loss and exercise do not work.  If you are 75-52 years old, ask your health care provider if you should take aspirin to prevent strokes.  Diabetes screening involves taking a blood sample to check your fasting blood sugar level. This should be done once every 3 years, after age 15, if you are within normal weight and without risk factors for diabetes. Testing should be considered at a younger age or be carried out more frequently if you are overweight and have at least 1 risk factor for diabetes.  Breast cancer screening is essential preventive care for women. You should practice "breast self-awareness." This means understanding the normal appearance and feel of your breasts and may include breast self-examination. Any changes detected, no matter how small, should be reported to a health care provider. Women in their 58s and 30s should have a clinical breast exam (CBE) by a health care provider as part of a regular health exam every 1 to 3 years. After age 16, women should have a CBE every year. Starting at age 53, women should consider having a mammogram (breast X-ray test) every year. Women who have a family history of breast cancer should talk to their health care provider about genetic screening. Women at a high risk of breast cancer should talk to their health care providers about having an MRI and a mammogram every year.  Breast cancer gene (BRCA)-related cancer risk assessment is recommended for women who have family members with BRCA-related cancers. BRCA-related cancers include breast, ovarian, tubal, and peritoneal cancers. Having family members with these cancers may be associated with an increased risk for harmful changes (mutations) in the breast cancer genes BRCA1 and BRCA2. Results of the assessment will determine the need for genetic counseling and  BRCA1 and BRCA2 testing.  Routine pelvic exams to screen for cancer are no longer recommended for nonpregnant women who are considered low risk for cancer of the pelvic organs (ovaries, uterus, and vagina) and who do not have symptoms. Ask your health care provider if a screening pelvic exam is right for you.  If you have had past treatment for cervical cancer or a condition that could lead to cancer, you need Pap tests and screening for cancer for at least 20 years after your treatment. If Pap tests have been discontinued, your risk factors (such as having a new sexual partner) need to be reassessed to determine if screening should be resumed. Some women have medical problems that increase the chance of getting cervical cancer. In these cases, your health care provider may recommend more frequent screening and Pap tests.  The HPV test is an additional test that may be used for cervical cancer screening. The HPV test looks for the virus that can cause the cell changes on the cervix. The cells collected during the Pap test can be  tested for HPV. The HPV test could be used to screen women aged 30 years and older, and should be used in women of any age who have unclear Pap test results. After the age of 30, women should have HPV testing at the same frequency as a Pap test.  Colorectal cancer can be detected and often prevented. Most routine colorectal cancer screening begins at the age of 50 years and continues through age 75 years. However, your health care provider may recommend screening at an earlier age if you have risk factors for colon cancer. On a yearly basis, your health care provider may provide home test kits to check for hidden blood in the stool. Use of a small camera at the end of a tube, to directly examine the colon (sigmoidoscopy or colonoscopy), can detect the earliest forms of colorectal cancer. Talk to your health care provider about this at age 50, when routine screening begins. Direct  exam of the colon should be repeated every 5-10 years through age 75 years, unless early forms of pre-cancerous polyps or small growths are found.  People who are at an increased risk for hepatitis B should be screened for this virus. You are considered at high risk for hepatitis B if:  You were born in a country where hepatitis B occurs often. Talk with your health care provider about which countries are considered high risk.  Your parents were born in a high-risk country and you have not received a shot to protect against hepatitis B (hepatitis B vaccine).  You have HIV or AIDS.  You use needles to inject street drugs.  You live with, or have sex with, someone who has hepatitis B.  You get hemodialysis treatment.  You take certain medicines for conditions like cancer, organ transplantation, and autoimmune conditions.  Hepatitis C blood testing is recommended for all people born from 1945 through 1965 and any individual with known risks for hepatitis C.  Practice safe sex. Use condoms and avoid high-risk sexual practices to reduce the spread of sexually transmitted infections (STIs). STIs include gonorrhea, chlamydia, syphilis, trichomonas, herpes, HPV, and human immunodeficiency virus (HIV). Herpes, HIV, and HPV are viral illnesses that have no cure. They can result in disability, cancer, and death.  You should be screened for sexually transmitted illnesses (STIs) including gonorrhea and chlamydia if:  You are sexually active and are younger than 24 years.  You are older than 24 years and your health care provider tells you that you are at risk for this type of infection.  Your sexual activity has changed since you were last screened and you are at an increased risk for chlamydia or gonorrhea. Ask your health care provider if you are at risk.  If you are at risk of being infected with HIV, it is recommended that you take a prescription medicine daily to prevent HIV infection. This is  called preexposure prophylaxis (PrEP). You are considered at risk if:  You are a heterosexual woman, are sexually active, and are at increased risk for HIV infection.  You take drugs by injection.  You are sexually active with a partner who has HIV.  Talk with your health care provider about whether you are at high risk of being infected with HIV. If you choose to begin PrEP, you should first be tested for HIV. You should then be tested every 3 months for as long as you are taking PrEP.  Osteoporosis is a disease in which the bones lose minerals and strength   with aging. This can result in serious bone fractures or breaks. The risk of osteoporosis can be identified using a bone density scan. Women ages 65 years and over and women at risk for fractures or osteoporosis should discuss screening with their health care providers. Ask your health care provider whether you should take a calcium supplement or vitamin D to reduce the rate of osteoporosis.  Menopause can be associated with physical symptoms and risks. Hormone replacement therapy is available to decrease symptoms and risks. You should talk to your health care provider about whether hormone replacement therapy is right for you.  Use sunscreen. Apply sunscreen liberally and repeatedly throughout the day. You should seek shade when your shadow is shorter than you. Protect yourself by wearing long sleeves, pants, a wide-brimmed hat, and sunglasses year round, whenever you are outdoors.  Once a month, do a whole body skin exam, using a mirror to look at the skin on your back. Tell your health care provider of new moles, moles that have irregular borders, moles that are larger than a pencil eraser, or moles that have changed in shape or color.  Stay current with required vaccines (immunizations).  Influenza vaccine. All adults should be immunized every year.  Tetanus, diphtheria, and acellular pertussis (Td, Tdap) vaccine. Pregnant women should  receive 1 dose of Tdap vaccine during each pregnancy. The dose should be obtained regardless of the length of time since the last dose. Immunization is preferred during the 27th-36th week of gestation. An adult who has not previously received Tdap or who does not know her vaccine status should receive 1 dose of Tdap. This initial dose should be followed by tetanus and diphtheria toxoids (Td) booster doses every 10 years. Adults with an unknown or incomplete history of completing a 3-dose immunization series with Td-containing vaccines should begin or complete a primary immunization series including a Tdap dose. Adults should receive a Td booster every 10 years.  Varicella vaccine. An adult without evidence of immunity to varicella should receive 2 doses or a second dose if she has previously received 1 dose. Pregnant females who do not have evidence of immunity should receive the first dose after pregnancy. This first dose should be obtained before leaving the health care facility. The second dose should be obtained 4-8 weeks after the first dose.  Human papillomavirus (HPV) vaccine. Females aged 13-26 years who have not received the vaccine previously should obtain the 3-dose series. The vaccine is not recommended for use in pregnant females. However, pregnancy testing is not needed before receiving a dose. If a female is found to be pregnant after receiving a dose, no treatment is needed. In that case, the remaining doses should be delayed until after the pregnancy. Immunization is recommended for any person with an immunocompromised condition through the age of 26 years if she did not get any or all doses earlier. During the 3-dose series, the second dose should be obtained 4-8 weeks after the first dose. The third dose should be obtained 24 weeks after the first dose and 16 weeks after the second dose.  Zoster vaccine. One dose is recommended for adults aged 60 years or older unless certain conditions are  present.  Measles, mumps, and rubella (MMR) vaccine. Adults born before 1957 generally are considered immune to measles and mumps. Adults born in 1957 or later should have 1 or more doses of MMR vaccine unless there is a contraindication to the vaccine or there is laboratory evidence of immunity to   each of the three diseases. A routine second dose of MMR vaccine should be obtained at least 28 days after the first dose for students attending postsecondary schools, health care workers, or international travelers. People who received inactivated measles vaccine or an unknown type of measles vaccine during 1963-1967 should receive 2 doses of MMR vaccine. People who received inactivated mumps vaccine or an unknown type of mumps vaccine before 1979 and are at high risk for mumps infection should consider immunization with 2 doses of MMR vaccine. For females of childbearing age, rubella immunity should be determined. If there is no evidence of immunity, females who are not pregnant should be vaccinated. If there is no evidence of immunity, females who are pregnant should delay immunization until after pregnancy. Unvaccinated health care workers born before 1957 who lack laboratory evidence of measles, mumps, or rubella immunity or laboratory confirmation of disease should consider measles and mumps immunization with 2 doses of MMR vaccine or rubella immunization with 1 dose of MMR vaccine.  Pneumococcal 13-valent conjugate (PCV13) vaccine. When indicated, a person who is uncertain of her immunization history and has no record of immunization should receive the PCV13 vaccine. An adult aged 19 years or older who has certain medical conditions and has not been previously immunized should receive 1 dose of PCV13 vaccine. This PCV13 should be followed with a dose of pneumococcal polysaccharide (PPSV23) vaccine. The PPSV23 vaccine dose should be obtained at least 8 weeks after the dose of PCV13 vaccine. An adult aged 19  years or older who has certain medical conditions and previously received 1 or more doses of PPSV23 vaccine should receive 1 dose of PCV13. The PCV13 vaccine dose should be obtained 1 or more years after the last PPSV23 vaccine dose.  Pneumococcal polysaccharide (PPSV23) vaccine. When PCV13 is also indicated, PCV13 should be obtained first. All adults aged 65 years and older should be immunized. An adult younger than age 65 years who has certain medical conditions should be immunized. Any person who resides in a nursing home or long-term care facility should be immunized. An adult smoker should be immunized. People with an immunocompromised condition and certain other conditions should receive both PCV13 and PPSV23 vaccines. People with human immunodeficiency virus (HIV) infection should be immunized as soon as possible after diagnosis. Immunization during chemotherapy or radiation therapy should be avoided. Routine use of PPSV23 vaccine is not recommended for American Indians, [place] Natives, or people younger than 65 years unless there are medical conditions that require PPSV23 vaccine. When indicated, people who have unknown immunization and have no record of immunization should receive PPSV23 vaccine. One-time revaccination 5 years after the first dose of PPSV23 is recommended for people aged 19-64 years who have chronic kidney failure, nephrotic syndrome, asplenia, or immunocompromised conditions. People who received 1-2 doses of PPSV23 before age 65 years should receive another dose of PPSV23 vaccine at age 65 years or later if at least 5 years have passed since the previous dose. Doses of PPSV23 are not needed for people immunized with PPSV23 at or after age 65 years.  Meningococcal vaccine. Adults with asplenia or persistent complement component deficiencies should receive 2 doses of quadrivalent meningococcal conjugate (MenACWY-D) vaccine. The doses should be obtained at least 2 months apart.  Microbiologists working with certain meningococcal bacteria, military recruits, people at risk during an outbreak, and people who travel to or live in countries with a high rate of meningitis should be immunized. A first-year college student up through age   21 years who is living in a residence hall should receive a dose if she did not receive a dose on or after her 16th birthday. Adults who have certain high-risk conditions should receive one or more doses of vaccine.  Hepatitis A vaccine. Adults who wish to be protected from this disease, have certain high-risk conditions, work with hepatitis A-infected animals, work in hepatitis A research labs, or travel to or work in countries with a high rate of hepatitis A should be immunized. Adults who were previously unvaccinated and who anticipate close contact with an international adoptee during the first 60 days after arrival in the [place] from a country with a high rate of hepatitis A should be immunized.  Hepatitis B vaccine. Adults who wish to be protected from this disease, have certain high-risk conditions, may be exposed to blood or other infectious body fluids, are household contacts or sex partners of hepatitis B positive people, are clients or workers in certain care facilities, or travel to or work in countries with a high rate of hepatitis B should be immunized.  Haemophilus influenzae type b (Hib) vaccine. A previously unvaccinated person with asplenia or sickle cell disease or having a scheduled splenectomy should receive 1 dose of Hib vaccine. Regardless of previous immunization, a recipient of a hematopoietic stem cell transplant should receive a 3-dose series 6-12 months after her successful transplant. Hib vaccine is not recommended for adults with HIV infection. Preventive Services / Frequency Ages 64 to 68 years  Blood pressure check.** / Every 1 to 2 years.  Lipid and cholesterol check.** / Every 5 years beginning at age  22.  Clinical breast exam.** / Every 3 years for women in their 88s and 53s.  BRCA-related cancer risk assessment.** / For women who have family members with a BRCA-related cancer (breast, ovarian, tubal, or peritoneal cancers).  Pap test.** / Every 2 years from ages 90 through 51. Every 3 years starting at age 21 through age 56 or 3 with a history of 3 consecutive normal Pap tests.  HPV screening.** / Every 3 years from ages 24 through ages 1 to 46 with a history of 3 consecutive normal Pap tests.  Hepatitis C blood test.** / For any individual with known risks for hepatitis C.  Skin self-exam. / Monthly.  Influenza vaccine. / Every year.  Tetanus, diphtheria, and acellular pertussis (Tdap, Td) vaccine.** / Consult your health care provider. Pregnant women should receive 1 dose of Tdap vaccine during each pregnancy. 1 dose of Td every 10 years.  Varicella vaccine.** / Consult your health care provider. Pregnant females who do not have evidence of immunity should receive the first dose after pregnancy.  HPV vaccine. / 3 doses over 6 months, if 72 and younger. The vaccine is not recommended for use in pregnant females. However, pregnancy testing is not needed before receiving a dose.  Measles, mumps, rubella (MMR) vaccine.** / You need at least 1 dose of MMR if you were born in 1957 or later. You may also need a 2nd dose. For females of childbearing age, rubella immunity should be determined. If there is no evidence of immunity, females who are not pregnant should be vaccinated. If there is no evidence of immunity, females who are pregnant should delay immunization until after pregnancy.  Pneumococcal 13-valent conjugate (PCV13) vaccine.** / Consult your health care provider.  Pneumococcal polysaccharide (PPSV23) vaccine.** / 1 to 2 doses if you smoke cigarettes or if you have certain conditions.  Meningococcal vaccine.** /  1 dose if you are age 19 to 21 years and a first-year college  student living in a residence hall, or have one of several medical conditions, you need to get vaccinated against meningococcal disease. You may also need additional booster doses.  Hepatitis A vaccine.** / Consult your health care provider.  Hepatitis B vaccine.** / Consult your health care provider.  Haemophilus influenzae type b (Hib) vaccine.** / Consult your health care provider. Ages 40 to 64 years  Blood pressure check.** / Every 1 to 2 years.  Lipid and cholesterol check.** / Every 5 years beginning at age 20 years.  Lung cancer screening. / Every year if you are aged 55-80 years and have a 30-pack-year history of smoking and currently smoke or have quit within the past 15 years. Yearly screening is stopped once you have quit smoking for at least 15 years or develop a health problem that would prevent you from having lung cancer treatment.  Clinical breast exam.** / Every year after age 40 years.  BRCA-related cancer risk assessment.** / For women who have family members with a BRCA-related cancer (breast, ovarian, tubal, or peritoneal cancers).  Mammogram.** / Every year beginning at age 40 years and continuing for as long as you are in good health. Consult with your health care provider.  Pap test.** / Every 3 years starting at age 30 years through age 65 or 70 years with a history of 3 consecutive normal Pap tests.  HPV screening.** / Every 3 years from ages 30 years through ages 65 to 70 years with a history of 3 consecutive normal Pap tests.  Fecal occult blood test (FOBT) of stool. / Every year beginning at age 50 years and continuing until age 75 years. You may not need to do this test if you get a colonoscopy every 10 years.  Flexible sigmoidoscopy or colonoscopy.** / Every 5 years for a flexible sigmoidoscopy or every 10 years for a colonoscopy beginning at age 50 years and continuing until age 75 years.  Hepatitis C blood test.** / For all people born from 1945 through  1965 and any individual with known risks for hepatitis C.  Skin self-exam. / Monthly.  Influenza vaccine. / Every year.  Tetanus, diphtheria, and acellular pertussis (Tdap/Td) vaccine.** / Consult your health care provider. Pregnant women should receive 1 dose of Tdap vaccine during each pregnancy. 1 dose of Td every 10 years.  Varicella vaccine.** / Consult your health care provider. Pregnant females who do not have evidence of immunity should receive the first dose after pregnancy.  Zoster vaccine.** / 1 dose for adults aged 60 years or older.  Measles, mumps, rubella (MMR) vaccine.** / You need at least 1 dose of MMR if you were born in 1957 or later. You may also need a 2nd dose. For females of childbearing age, rubella immunity should be determined. If there is no evidence of immunity, females who are not pregnant should be vaccinated. If there is no evidence of immunity, females who are pregnant should delay immunization until after pregnancy.  Pneumococcal 13-valent conjugate (PCV13) vaccine.** / Consult your health care provider.  Pneumococcal polysaccharide (PPSV23) vaccine.** / 1 to 2 doses if you smoke cigarettes or if you have certain conditions.  Meningococcal vaccine.** / Consult your health care provider.  Hepatitis A vaccine.** / Consult your health care provider.  Hepatitis B vaccine.** / Consult your health care provider.  Haemophilus influenzae type b (Hib) vaccine.** / Consult your health care provider. Ages 65   years and over  Blood pressure check.** / Every 1 to 2 years.  Lipid and cholesterol check.** / Every 5 years beginning at age 22 years.  Lung cancer screening. / Every year if you are aged 73-80 years and have a 30-pack-year history of smoking and currently smoke or have quit within the past 15 years. Yearly screening is stopped once you have quit smoking for at least 15 years or develop a health problem that would prevent you from having lung cancer  treatment.  Clinical breast exam.** / Every year after age 4 years.  BRCA-related cancer risk assessment.** / For women who have family members with a BRCA-related cancer (breast, ovarian, tubal, or peritoneal cancers).  Mammogram.** / Every year beginning at age 40 years and continuing for as long as you are in good health. Consult with your health care provider.  Pap test.** / Every 3 years starting at age 9 years through age 34 or 91 years with 3 consecutive normal Pap tests. Testing can be stopped between 65 and 70 years with 3 consecutive normal Pap tests and no abnormal Pap or HPV tests in the past 10 years.  HPV screening.** / Every 3 years from ages 57 years through ages 64 or 45 years with a history of 3 consecutive normal Pap tests. Testing can be stopped between 65 and 70 years with 3 consecutive normal Pap tests and no abnormal Pap or HPV tests in the past 10 years.  Fecal occult blood test (FOBT) of stool. / Every year beginning at age 15 years and continuing until age 17 years. You may not need to do this test if you get a colonoscopy every 10 years.  Flexible sigmoidoscopy or colonoscopy.** / Every 5 years for a flexible sigmoidoscopy or every 10 years for a colonoscopy beginning at age 86 years and continuing until age 71 years.  Hepatitis C blood test.** / For all people born from [birth year] through 1967 and any individual with known risks for hepatitis C.  Osteoporosis screening.** / A one-time screening for women ages 83 years and over and women at risk for fractures or osteoporosis.  Skin self-exam. / Monthly.  Influenza vaccine. / Every year.  Tetanus, diphtheria, and acellular pertussis (Tdap/Td) vaccine.** / 1 dose of Td every 10 years.  Varicella vaccine.** / Consult your health care provider.  Zoster vaccine.** / 1 dose for adults aged 61 years or older.  Pneumococcal 13-valent conjugate (PCV13) vaccine.** / Consult your health care provider.  Pneumococcal  polysaccharide (PPSV23) vaccine.** / 1 dose for all adults aged 28 years and older.  Meningococcal vaccine.** / Consult your health care provider.  Hepatitis A vaccine.** / Consult your health care provider.  Hepatitis B vaccine.** / Consult your health care provider.  Haemophilus influenzae type b (Hib) vaccine.** / Consult your health care provider. ** Family history and personal history of risk and conditions may change your health care provider's recommendations. Document Released: 02/10/2002 Document Revised: 05/01/2014 Document Reviewed: 05/12/2011 [facility] Patient Information 2015 [place], [place]. This information is not intended to replace advice given to you by your health care provider. Make sure you discuss any questions you have with your health care provider.

## 2014-08-08 NOTE — Assessment & Plan Note (Signed)
Tramadol prn therapy as reflected on the Med list. Aleve prn

## 2014-08-08 NOTE — Assessment & Plan Note (Signed)
Will sch w/Dr Henrene Pastor

## 2014-08-08 NOTE — Progress Notes (Signed)
Pre visit review using our clinic review tool, if applicable. No additional management support is needed unless otherwise documented below in the visit note. 

## 2014-08-08 NOTE — Assessment & Plan Note (Signed)
Continue with current prescription therapy as reflected on the Med list.  

## 2014-08-08 NOTE — Assessment & Plan Note (Signed)
A1c

## 2014-08-08 NOTE — Assessment & Plan Note (Signed)
2011 Chronic w/elev LFTs  Wt loss, diet

## 2014-08-08 NOTE — Assessment & Plan Note (Signed)
Labs

## 2014-08-08 NOTE — Assessment & Plan Note (Signed)
Continue with current Tramadol prn therapy as reflected on the Med list. Aleve prn

## 2014-08-09 ENCOUNTER — Telehealth: Payer: Self-pay | Admitting: Internal Medicine

## 2014-08-09 NOTE — Telephone Encounter (Signed)
Relevant patient education assigned to patient using Emmi. ° °

## 2014-08-29 ENCOUNTER — Telehealth: Payer: Self-pay

## 2014-08-29 NOTE — Telephone Encounter (Signed)
Pt left message on triage.   Called pt back. AWV was completed 08/08/14. Scheduled pt for flu vaccine for 09/05/14

## 2014-08-29 NOTE — Telephone Encounter (Signed)
Called to schedule annual wellness visit

## 2014-09-05 ENCOUNTER — Ambulatory Visit: Payer: Medicare Other

## 2014-09-05 DIAGNOSIS — Z23 Encounter for immunization: Secondary | ICD-10-CM

## 2014-09-08 ENCOUNTER — Encounter: Payer: Self-pay | Admitting: *Deleted

## 2014-10-25 ENCOUNTER — Encounter: Payer: Self-pay | Admitting: Internal Medicine

## 2014-10-30 ENCOUNTER — Encounter: Payer: Self-pay | Admitting: Internal Medicine

## 2014-11-10 ENCOUNTER — Telehealth: Payer: Self-pay | Admitting: *Deleted

## 2014-11-10 ENCOUNTER — Ambulatory Visit (AMBULATORY_SURGERY_CENTER): Payer: Self-pay | Admitting: *Deleted

## 2014-11-10 VITALS — Ht 62.5 in | Wt 282.0 lb

## 2014-11-10 DIAGNOSIS — Z86010 Personal history of colonic polyps: Secondary | ICD-10-CM

## 2014-11-10 MED ORDER — MOVIPREP 100 G PO SOLR: 1.0000 | Freq: Once | ORAL | Status: DC

## 2014-11-10 NOTE — Telephone Encounter (Signed)
Pt was seen in [place] today 11/10/14. BMI was 50.72. Pt was weighed without shoes. Does pt need OV or okay for direct at [facility]?  Gave pt blank instructions and consent was signed for Hospital. pls adv-adm

## 2014-11-10 NOTE — Progress Notes (Signed)
No egg or soy allergy. No anesthesia problems.  No home O2.  No diet meds.  Pt refused emmi. 

## 2014-11-13 NOTE — Telephone Encounter (Signed)
[place] for direct to hospital [facility] Basta, my next hospital week is quite far in the future I would like to try to block 1/2 day at [facility] for procedures such as this one.

## 2014-11-17 NOTE — Telephone Encounter (Signed)
Spoke with Samantha [name] and she states there is not a day available to block. Do you just want her done next hospital week? Please advise.

## 2014-11-17 NOTE — Telephone Encounter (Signed)
Next hospital week for MAC screening colon

## 2014-11-21 ENCOUNTER — Other Ambulatory Visit: Payer: Self-pay

## 2014-11-21 DIAGNOSIS — Z1211 Encounter for screening for malignant neoplasm of colon: Secondary | ICD-10-CM

## 2014-11-22 NOTE — Telephone Encounter (Signed)
Pt scheduled at [facility] 01/09/15@11 :30am, pt to arrive there at 10:30am. Updated instructions mailed to pt and she is aware of appt.

## 2014-12-06 ENCOUNTER — Encounter: Payer: Medicare Other | Admitting: Internal Medicine

## 2014-12-08 ENCOUNTER — Encounter: Payer: Self-pay | Admitting: Internal Medicine

## 2014-12-08 ENCOUNTER — Ambulatory Visit (INDEPENDENT_AMBULATORY_CARE_PROVIDER_SITE_OTHER): Payer: Medicare Other | Admitting: Internal Medicine

## 2014-12-08 VITALS — BP 120/80 | HR 84 | Temp 98.4°F | Wt 283.0 lb

## 2014-12-08 DIAGNOSIS — Z23 Encounter for immunization: Secondary | ICD-10-CM

## 2014-12-08 DIAGNOSIS — N3090 Cystitis, unspecified without hematuria: Secondary | ICD-10-CM

## 2014-12-08 DIAGNOSIS — R739 Hyperglycemia, unspecified: Secondary | ICD-10-CM

## 2014-12-08 DIAGNOSIS — I10 Essential (primary) hypertension: Secondary | ICD-10-CM

## 2014-12-08 DIAGNOSIS — K760 Fatty (change of) liver, not elsewhere classified: Secondary | ICD-10-CM

## 2014-12-08 DIAGNOSIS — M5441 Lumbago with sciatica, right side: Secondary | ICD-10-CM

## 2014-12-08 DIAGNOSIS — M5442 Lumbago with sciatica, left side: Secondary | ICD-10-CM

## 2014-12-08 MED ORDER — CIPROFLOXACIN HCL 250 MG PO TABS: 250.0000 mg | ORAL_TABLET | Freq: Two times a day (BID) | ORAL | Status: DC

## 2014-12-08 NOTE — Assessment & Plan Note (Signed)
Low carb diet 

## 2014-12-08 NOTE — Progress Notes (Signed)
Pre visit review using our clinic review tool, if applicable. No additional management support is needed unless otherwise documented below in the visit note. 

## 2014-12-08 NOTE — Assessment & Plan Note (Signed)
Labs Wt loss 

## 2014-12-08 NOTE — Assessment & Plan Note (Signed)
labs

## 2014-12-08 NOTE — Assessment & Plan Note (Signed)
Continue with current prescription therapy as reflected on the Med list.  

## 2014-12-08 NOTE — Assessment & Plan Note (Signed)
Continue with current prn prescription therapy as reflected on the Med list.  

## 2014-12-08 NOTE — Progress Notes (Signed)
   Subjective:    HPI    F/u OA with neck pain, shoulders and knees; HTN, colon polyps - s/p colonoscopy C/o UTI sx's  F/u allergies  Review of Systems  Constitutional: Negative for chills, activity change, appetite change, fatigue and unexpected weight change.  HENT: Negative for congestion, mouth sores, postnasal drip and sinus pressure.   Eyes: Negative for visual disturbance.  Respiratory: Negative for cough and chest tightness.   Gastrointestinal: Negative for nausea and abdominal pain.  Genitourinary: Negative for frequency, difficulty urinating and vaginal pain.  Musculoskeletal: Positive for back pain (neck pain) and arthralgias. Negative for gait problem.  Skin: Negative for pallor and rash.  Neurological: Negative for dizziness, tremors, weakness, numbness and headaches.  Psychiatric/Behavioral: Negative for suicidal ideas, confusion and sleep disturbance.   Wt Readings from Last 3 Encounters:  12/08/14 283 lb (128.368 kg)  11/10/14 282 lb (127.914 kg)  08/08/14 281 lb (127.461 kg)   BP Readings from Last 3 Encounters:  12/08/14 120/80  08/08/14 131/91  02/06/14 130/80        Objective:   Physical Exam  Constitutional: She appears well-developed. No distress.  HENT:  Head: Normocephalic.  Right Ear: External ear normal.  Left Ear: External ear normal.  Nose: Nose normal.  Mouth/Throat: Oropharynx is clear and moist.  Eyes: Conjunctivae are normal. Pupils are equal, round, and reactive to light. Right eye exhibits no discharge. Left eye exhibits no discharge.  Neck: Normal range of motion. Neck supple. No JVD present. No tracheal deviation present. No thyromegaly present.  Cardiovascular: Normal rate, regular rhythm and normal heart sounds.   Pulmonary/Chest: No stridor. No respiratory distress. She has no wheezes.  Abdominal: Soft. Bowel sounds are normal. She exhibits no distension and no mass. There is no tenderness. There is no rebound and no guarding.   Musculoskeletal: She exhibits no edema or tenderness.  Lymphadenopathy:    She has no cervical adenopathy.  Neurological: She displays normal reflexes. No cranial nerve deficit. She exhibits normal muscle tone. Coordination normal.  Skin: No rash noted. No erythema.  Psychiatric: She has a normal mood and affect. Her behavior is normal. Judgment and thought content normal.  Obese Limp  Lab Results  Component Value Date   WBC 4.7 08/08/2014   HGB 12.8 08/08/2014   HCT 39.6 08/08/2014   PLT 189.0 08/08/2014   GLUCOSE 95 08/08/2014   CHOL 153 08/08/2014   TRIG 91.0 08/08/2014   HDL 52.90 08/08/2014   LDLCALC 82 08/08/2014   ALT 66* 08/08/2014   AST 62* 08/08/2014   NA 140 08/08/2014   K 4.2 08/08/2014   CL 105 08/08/2014   CREATININE 0.9 08/08/2014   BUN 16 08/08/2014   CO2 22 08/08/2014   TSH 1.11 08/08/2014   INR 2.2* 03/17/2008   HGBA1C 5.9 08/08/2014        Assessment & Plan:

## 2014-12-08 NOTE — Assessment & Plan Note (Signed)
Po abx 

## 2014-12-25 ENCOUNTER — Encounter (HOSPITAL_COMMUNITY): Payer: Self-pay | Admitting: *Deleted

## 2015-01-09 ENCOUNTER — Ambulatory Visit (HOSPITAL_COMMUNITY)
Admission: RE | Admit: 2015-01-09 | Discharge: 2015-01-09 | Disposition: A | Discharge status: Home / Self Care | Payer: Medicare Other | Source: Ambulatory Visit | Attending: Internal Medicine | Admitting: Internal Medicine

## 2015-01-09 ENCOUNTER — Encounter (HOSPITAL_COMMUNITY): Payer: Self-pay | Admitting: *Deleted

## 2015-01-09 ENCOUNTER — Ambulatory Visit (HOSPITAL_COMMUNITY): Payer: Medicare Other | Admitting: Anesthesiology

## 2015-01-09 ENCOUNTER — Encounter (HOSPITAL_COMMUNITY)
Admission: RE | Disposition: A | Discharge status: Home / Self Care | Payer: Self-pay | Source: Ambulatory Visit | Attending: Internal Medicine

## 2015-01-09 DIAGNOSIS — Z1211 Encounter for screening for malignant neoplasm of colon: Secondary | ICD-10-CM | POA: Insufficient documentation

## 2015-01-09 DIAGNOSIS — K219 Gastro-esophageal reflux disease without esophagitis: Secondary | ICD-10-CM | POA: Insufficient documentation

## 2015-01-09 DIAGNOSIS — B1920 Unspecified viral hepatitis C without hepatic coma: Secondary | ICD-10-CM | POA: Insufficient documentation

## 2015-01-09 DIAGNOSIS — I10 Essential (primary) hypertension: Secondary | ICD-10-CM | POA: Insufficient documentation

## 2015-01-09 DIAGNOSIS — Z860100 Personal history of colon polyps, unspecified: Secondary | ICD-10-CM | POA: Insufficient documentation

## 2015-01-09 DIAGNOSIS — K6389 Other specified diseases of intestine: Secondary | ICD-10-CM | POA: Diagnosis not present

## 2015-01-09 DIAGNOSIS — Z87891 Personal history of nicotine dependence: Secondary | ICD-10-CM | POA: Insufficient documentation

## 2015-01-09 DIAGNOSIS — M179 Osteoarthritis of knee, unspecified: Secondary | ICD-10-CM | POA: Insufficient documentation

## 2015-01-09 DIAGNOSIS — Z8249 Family history of ischemic heart disease and other diseases of the circulatory system: Secondary | ICD-10-CM | POA: Diagnosis not present

## 2015-01-09 DIAGNOSIS — K648 Other hemorrhoids: Secondary | ICD-10-CM | POA: Diagnosis not present

## 2015-01-09 DIAGNOSIS — E785 Hyperlipidemia, unspecified: Secondary | ICD-10-CM | POA: Diagnosis not present

## 2015-01-09 DIAGNOSIS — Z6841 Body Mass Index (BMI) 40.0 and over, adult: Secondary | ICD-10-CM | POA: Insufficient documentation

## 2015-01-09 DIAGNOSIS — K635 Polyp of colon: Secondary | ICD-10-CM | POA: Diagnosis not present

## 2015-01-09 DIAGNOSIS — K5730 Diverticulosis of large intestine without perforation or abscess without bleeding: Secondary | ICD-10-CM | POA: Diagnosis not present

## 2015-01-09 DIAGNOSIS — Z09 Encounter for follow-up examination after completed treatment for conditions other than malignant neoplasm: Secondary | ICD-10-CM | POA: Diagnosis present

## 2015-01-09 DIAGNOSIS — D123 Benign neoplasm of transverse colon: Secondary | ICD-10-CM

## 2015-01-09 HISTORY — PX: COLONOSCOPY: SHX5424

## 2015-01-09 SURGERY — COLONOSCOPY
Anesthesia: Monitor Anesthesia Care

## 2015-01-09 MED ORDER — LABETALOL HCL 5 MG/ML IV SOLN
INTRAVENOUS | Status: AC
  Filled 2015-01-09: qty 4

## 2015-01-09 MED ORDER — GLYCOPYRROLATE 0.2 MG/ML IJ SOLN
INTRAMUSCULAR | Status: AC
  Filled 2015-01-09: qty 1

## 2015-01-09 MED ORDER — SODIUM CHLORIDE 0.9 % IV SOLN: INTRAVENOUS | Status: DC

## 2015-01-09 MED ORDER — LABETALOL HCL 5 MG/ML IV SOLN
INTRAVENOUS | Status: DC | PRN
Start: 2015-01-09 — End: 2015-01-09
  Administered 2015-01-09: 5 mg via INTRAVENOUS

## 2015-01-09 MED ORDER — PROPOFOL 10 MG/ML IV BOLUS
INTRAVENOUS | Status: AC
  Filled 2015-01-09: qty 20

## 2015-01-09 MED ORDER — LACTATED RINGERS IV SOLN
INTRAVENOUS | Status: DC
  Administered 2015-01-09: 1000 mL via INTRAVENOUS

## 2015-01-09 MED ORDER — PROPOFOL INFUSION 10 MG/ML OPTIME
INTRAVENOUS | Status: DC | PRN
  Administered 2015-01-09: 300 ug/kg/min via INTRAVENOUS

## 2015-01-09 MED ORDER — GLYCOPYRROLATE 0.2 MG/ML IJ SOLN
INTRAMUSCULAR | Status: DC | PRN
  Administered 2015-01-09: 0.1 mg via INTRAVENOUS

## 2015-01-09 NOTE — Op Note (Signed)
[facility] [place], [address]   COLONOSCOPY PROCEDURE REPORT  PATIENT: Samantha [name], Samantha [name]  MR#: [id number] BIRTHDATE: [birth year] , 32  yrs. old GENDER: female ENDOSCOPIST: Jerene Bears, MD PROCEDURE DATE:  01/09/2015 PROCEDURE:   Colonoscopy with cold biopsy polypectomy First Screening Colonoscopy - Avg.  risk and is 50 yrs.  old or older - No.  Prior Negative Screening - Now for repeat screening. N/A  History of Adenoma - Now for follow-up colonoscopy & has been > or = to 3 yrs.  Yes hx of adenoma.  Has been 3 or more years since last colonoscopy.  Polyps Removed Today? Yes. ASA CLASS:   Class III INDICATIONS:surveillance colonoscopy based on a history of adenomatous colonic polyp(s). MEDICATIONS: Monitored anesthesia care and Per Anesthesia  DESCRIPTION OF PROCEDURE:   After the risks benefits and alternatives of the procedure were thoroughly explained, informed consent was obtained.  The digital rectal exam revealed no rectal mass.   The Pentax Adult Coloscope [id number]  endoscope was introduced through the anus and advanced to the cecum, which was identified by both the appendix and ileocecal valve. No adverse events experienced.   The quality of the prep was good, using MoviPrep  The instrument was then slowly withdrawn as the colon was fully examined.  COLON FINDINGS: Melanosis coli was found throughout the entire examined colon.   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   Two sessile polyps ranging between 3-65mm in size were found in the transverse colon. Polypectomies were performed with cold forceps.  The resection was complete, the polyp tissue was completely retrieved and sent to histology.   There was mild diverticulosis noted in the sigmoid colon.  Retroflexed views revealed internal hemorrhoids. The time to cecum=3 minutes 00 seconds.  Withdrawal time=13 minutes 00 seconds.  The scope was  withdrawn and the procedure completed. COMPLICATIONS: There were no immediate complications.  ENDOSCOPIC IMPRESSION: 1.   Melanosis coli was found throughout the entire examined colon 2.   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   Two sessile polyps ranging between 3-28mm in size were found in the transverse colon; polypectomies were performed with cold forceps 4.   Mild diverticulosis was noted in the sigmoid colon  RECOMMENDATIONS: 1.  Await pathology results 2.  High fiber diet 3.  Repeat Colonoscopy in 5 years. 4.  You will receive a letter within 1-2 weeks with the results of your biopsy as well as final recommendations.  Please call my office if you have not received a letter after 3 weeks.  eSigned:  Jerene Bears, MD 01/09/2015 12:34 PM cc: Altamese Clovis.  Plotnikov, MD and The Patient

## 2015-01-09 NOTE — Transfer of Care (Signed)
Immediate Anesthesia Transfer of Care Note  Patient: Samantha [name]  Procedure(s) Performed: Procedure(s): COLONOSCOPY (N/A)  Patient Location: PACU  Anesthesia Type:MAC  Level of Consciousness: awake, alert , oriented and patient cooperative  Airway & Oxygen Therapy: Patient Spontanous Breathing and Patient connected to face mask oxygen  Post-op Assessment: Report given to PACU RN, Post -op Vital signs reviewed and stable and Patient moving all extremities X 4  Post vital signs: stable  Complications: No apparent anesthesia complications

## 2015-01-09 NOTE — Discharge Instructions (Signed)
YOU HAD AN ENDOSCOPIC PROCEDURE TODAY: Refer to the procedure report that was given to you for any specific questions about what was found during the examination.  If the procedure report does not answer your questions, please call your gastroenterologist to clarify.  YOU SHOULD EXPECT: Some feelings of bloating in the abdomen. Passage of more gas than usual.  Walking can help get rid of the air that was put into your GI tract during the procedure and reduce the bloating. If you had a lower endoscopy (such as a colonoscopy or flexible sigmoidoscopy) you may notice spotting of blood in your stool or on the toilet paper.   DIET: Your first meal following the procedure should be a light meal and then it is ok to progress to your normal diet.  A half-sandwich or bowl of soup is an example of a good first meal.  Heavy or fried foods are harder to digest and may make you feel nasueas or bloated.  Drink plenty of fluids but you should avoid alcoholic beverages for 24 hours.  ACTIVITY: Your care partner should take you home directly after the procedure.  You should plan to take it easy, moving slowly for the rest of the day.  You can resume normal activity the day after the procedure however you should NOT DRIVE or use heavy machinery for 24 hours (because of the sedation medicines used during the test).    SYMPTOMS TO REPORT IMMEDIATELY  A gastroenterologist can be reached at any hour.  Please call your doctor's office for any of the following symptoms:   Following lower endoscopy (colonoscopy, flexible sigmoidoscopy)  Excessive amounts of blood in the stool  Significant tenderness, worsening of abdominal pains  Swelling of the abdomen that is new, acute  Fever of 100 or higher  Following upper endoscopy (EGD, EUS, ERCP)  Vomiting of blood or coffee ground material  New, significant abdominal pain  New, significant chest pain or pain under the shoulder blades  Painful or persistently difficult  swallowing  New shortness of breath  Black, tarry-looking stools  FOLLOW UP: If any biopsies were taken you will be contacted by phone or by letter within the next 1-3 weeks.  Call your gastroenterologist if you have not heard about the biopsies in 3 weeks.  Please also call your gastroenterologist's office with any specific questions about appointments or follow up tests.   Colonoscopy, Care After These instructions give you information on caring for yourself after your procedure. Your doctor may also give you more specific instructions. Call your doctor if you have any problems or questions after your procedure. HOME CARE  Do not drive for 24 hours.  Do not sign important papers or use machinery for 24 hours.  You may shower.  You may go back to your usual activities, but go slower for the first 24 hours.  Take rest breaks often during the first 24 hours.  Walk around or use warm packs on your belly (abdomen) if you have belly cramping or gas.  Drink enough fluids to keep your pee (urine) clear or pale yellow.  Resume your normal diet. Avoid heavy or fried foods.  Avoid drinking alcohol for 24 hours or as told by your doctor.  Only take medicines as told by your doctor. If a tissue sample (biopsy) was taken during the procedure:   Do not take aspirin or blood thinners for 7 days, or as told by your doctor.  Do not drink alcohol for 7 days, or  as told by your doctor.  Eat soft foods for the first 24 hours. GET HELP IF: You still have a small amount of blood in your poop (stool) 2-3 days after the procedure. GET HELP RIGHT AWAY IF:  You have more than a small amount of blood in your poop.  You see clumps of tissue (blood clots) in your poop.  Your belly is puffy (swollen).  You feel sick to your stomach (nauseous) or throw up (vomit).  You have a fever.  You have belly pain that gets worse and medicine does not help. MAKE SURE YOU:  Understand these  instructions.  Will watch your condition.  Will get help right away if you are not doing well or get worse. Document Released: 01/17/2011 Document Revised: 12/20/2013 Document Reviewed: 08/22/2013 [facility] Patient Information 2015 [place], [place]. This information is not intended to replace advice given to you by your health care provider. Make sure you discuss any questions you have with your health care provider.   Conscious Sedation, Adult, Care After Refer to this sheet in the next few weeks. These instructions provide you with information on caring for yourself after your procedure. Your health care provider may also give you more specific instructions. Your treatment has been planned according to current medical practices, but problems sometimes occur. Call your health care provider if you have any problems or questions after your procedure. WHAT TO EXPECT AFTER THE PROCEDURE  After your procedure:  You may feel sleepy, clumsy, and have poor balance for several hours.  Vomiting may occur if you eat too soon after the procedure. HOME CARE INSTRUCTIONS  Do not participate in any activities where you could become injured for at least 24 hours. Do not:  Drive.  Swim.  Ride a bicycle.  Operate heavy machinery.  Cook.  Use power tools.  Climb ladders.  Work from a high place.  Do not make important decisions or sign legal documents until you are improved.  If you vomit, drink water, juice, or soup when you can drink without vomiting. Make sure you have little or no nausea before eating solid foods.  Only take over-the-counter or prescription medicines for pain, discomfort, or fever as directed by your health care provider.  Make sure you and your family fully understand everything about the medicines given to you, including what side effects may occur.  You should not drink alcohol, take sleeping pills, or take medicines that cause drowsiness for at least 24 hours.  If  you smoke, do not smoke without supervision.  If you are feeling better, you may resume normal activities 24 hours after you were sedated.  Keep all appointments with your health care provider. SEEK MEDICAL CARE IF:  Your skin is pale or bluish in color.  You continue to feel nauseous or vomit.  Your pain is getting worse and is not helped by medicine.  You have bleeding or swelling.  You are still sleepy or feeling clumsy after 24 hours. SEEK IMMEDIATE MEDICAL CARE IF:  You develop a rash.  You have difficulty breathing.  You develop any type of allergic problem.  You have a fever. MAKE SURE YOU:  Understand these instructions.  Will watch your condition.  Will get help right away if you are not doing well or get worse. Document Released: 10/05/2013 Document Reviewed: 10/05/2013 [facility] Patient Information 2015 [place], [place]. This information is not intended to replace advice given to you by your health care provider. Make sure you discuss any  questions you have with your health care provider.

## 2015-01-09 NOTE — H&P (Signed)
HPI: Samantha [name] is a 64 year old female with past medical history of adenomatous colon polyps, hepatitis C, hypertension, arthritis, hyperlipidemia and GERD who presents for outpatient colonoscopy for surveillance. Her last colonoscopy was in 2012 performed by Dr. Verl Blalock. This revealed several polyps removed with hot and cold snare along with melanosis coli. Repeat colonoscopy is recommended in 3 years. Due to BMI greater than 50 the test was scheduled in the outpatient hospital setting with monitored anesthesia care. She denies complaint today. No abdominal pain, change in bowel habits or rectal bleeding/melena  Past Medical History  Diagnosis Date  . Hepatitis C   . Hypertension   . Elevated glucose   . LBP (low back pain)   . Obesity   . Osteoarthritis     B knees  . Hyperlipidemia 2011  . GERD (gastroesophageal reflux disease)   . Allergy     seasonal    Past Surgical History  Procedure Laterality Date  . Right knee replacement  2007  . Left shoulder arthroplasty    . Total knee replacement l  03-13-08  . Cholecystectomy    . Tubal ligation    . Joint replacement       (Not in an outpatient encounter)  Allergies  Allergen Reactions  . Benazepril Hcl     REACTION: cough  . Codeine Itching    Family History  Problem Relation Age of Onset  . Diabetes Other   . Hypertension Other   . Hypertension Mother   . Ovarian cancer Mother   . Hypertension Father   . Colon cancer Neg Hx   . Esophageal cancer Neg Hx   . Rectal cancer Neg Hx   . Stomach cancer Neg Hx     History  Substance Use Topics  . Smoking status: Former Smoker    Quit date: 12/30/2007  . Smokeless tobacco: Never Used  . Alcohol Use: 0.0 oz/week    2-3 Glasses of wine per week    ROS: As per history of present illness, otherwise negative  BP 147/81 mmHg  Pulse 81  Temp(Src) 98.5 F (36.9 C) (Oral)  Resp 14  Ht 5\' 2"  (1.575 m)  Wt 282 lb (127.914 kg)  BMI 51.57 kg/m2  SpO2  97% Gen: awake, alert, NAD HEENT: anicteric, op clear CV: RRR, no mrg Pulm: CTA b/l Abd: soft, NT/ND, +BS throughout Ext: no c/c/e Neuro: nonfocal   RELEVANT LABS AND IMAGING: CBC    Component Value Date/Time   WBC 4.7 08/08/2014 0858   RBC 4.74 08/08/2014 0858   HGB 12.8 08/08/2014 0858   HCT 39.6 08/08/2014 0858   PLT 189.0 08/08/2014 0858   MCV 83.6 08/08/2014 0858   MCHC 32.3 08/08/2014 0858   RDW 14.4 08/08/2014 0858   LYMPHSABS 1.6 08/08/2014 0858   MONOABS 0.5 08/08/2014 0858   EOSABS 0.1 08/08/2014 0858   BASOSABS 0.1 08/08/2014 0858    CMP     Component Value Date/Time   NA 140 08/08/2014 0858   K 4.2 08/08/2014 0858   CL 105 08/08/2014 0858   CO2 22 08/08/2014 0858   GLUCOSE 95 08/08/2014 0858   BUN 16 08/08/2014 0858   CREATININE 0.9 08/08/2014 0858   CALCIUM 9.4 08/08/2014 0858   PROT 8.0 08/08/2014 0858   ALBUMIN 3.8 08/08/2014 0858   AST 62* 08/08/2014 0858   ALT 66* 08/08/2014 0858   ALKPHOS 87 08/08/2014 0858   BILITOT 0.6 08/08/2014 0858   GFRNONAA 98.02 09/20/2010 1030   GFRAA  94 03/05/2009 1635    ASSESSMENT/PLAN:  64 year old female with past medical history of adenomatous colon polyps, hepatitis C, hypertension, arthritis, hyperlipidemia and GERD who presents for outpatient colonoscopy for surveillance  1. Hx of adenomatous colon polyps -- repeat colonoscopy recommended at this time per Dr. Verl Blalock. The nature of the procedure, as well as the risks, benefits, and alternatives were carefully and thoroughly reviewed with the patient. Ample time for discussion and questions allowed. The patient understood, was satisfied, and agreed to proceed.

## 2015-01-09 NOTE — Anesthesia Postprocedure Evaluation (Signed)
  Anesthesia Post-op Note  Patient: Samantha [name]  Procedure(s) Performed: Procedure(s) (LRB): COLONOSCOPY (N/A)  Patient Location: PACU  Anesthesia Type: MAC  Level of Consciousness: awake and alert   Airway and Oxygen Therapy: Patient Spontanous Breathing  Post-op Pain: mild  Post-op Assessment: Post-op Vital signs reviewed, Patient's Cardiovascular Status Stable, Respiratory Function Stable, Patent Airway and No signs of Nausea or vomiting  Last Vitals:  Filed Vitals:   01/09/15 1250  BP:   Pulse: 69  Temp:   Resp: 12    Post-op Vital Signs: stable   Complications: No apparent anesthesia complications

## 2015-01-09 NOTE — Anesthesia Preprocedure Evaluation (Addendum)
Anesthesia Evaluation  Patient identified by MRN, date of birth, ID band Patient awake    Reviewed: Allergy & Precautions, H&P , NPO status , Patient's Chart, lab work & pertinent test results  Airway Mallampati: II  TM Distance: >3 FB Neck ROM: full    Dental no notable dental hx. (+) Teeth Intact, Dental Advisory Given   Pulmonary neg pulmonary ROS, former smoker,  breath sounds clear to auscultation  Pulmonary exam normal       Cardiovascular hypertension, Pt. on medications Rhythm:regular Rate:Normal     Neuro/Psych negative neurological ROS  negative psych ROS   GI/Hepatic negative GI ROS, GERD-  Medicated and Controlled,(+) Hepatitis -, C  Endo/Other  Morbid obesity  Renal/GU negative Renal ROS  negative genitourinary   Musculoskeletal   Abdominal (+) + obese,   Peds  Hematology negative hematology ROS (+)   Anesthesia Other Findings   Reproductive/Obstetrics negative OB ROS                            Anesthesia Physical Anesthesia Plan  ASA: III  Anesthesia Plan: MAC   Post-op Pain Management:    Induction:   Airway Management Planned:   Additional Equipment:   Intra-op Plan:   Post-operative Plan:   Informed Consent: I have reviewed the patients History and Physical, chart, labs and discussed the procedure including the risks, benefits and alternatives for the proposed anesthesia with the patient or authorized representative who has indicated his/her understanding and acceptance.   Dental Advisory Given  Plan Discussed with: CRNA and Surgeon  Anesthesia Plan Comments:         Anesthesia Quick Evaluation

## 2015-01-10 ENCOUNTER — Encounter: Payer: Self-pay | Admitting: Internal Medicine

## 2015-01-10 ENCOUNTER — Encounter (HOSPITAL_COMMUNITY): Payer: Self-pay | Admitting: Internal Medicine

## 2015-04-06 ENCOUNTER — Other Ambulatory Visit: Payer: Self-pay

## 2015-04-06 DIAGNOSIS — Z1231 Encounter for screening mammogram for malignant neoplasm of breast: Secondary | ICD-10-CM

## 2015-04-13 ENCOUNTER — Other Ambulatory Visit (INDEPENDENT_AMBULATORY_CARE_PROVIDER_SITE_OTHER): Payer: Medicare Other

## 2015-04-13 ENCOUNTER — Ambulatory Visit (INDEPENDENT_AMBULATORY_CARE_PROVIDER_SITE_OTHER): Payer: Medicare Other | Admitting: Internal Medicine

## 2015-04-13 ENCOUNTER — Encounter: Payer: Self-pay | Admitting: Internal Medicine

## 2015-04-13 VITALS — BP 120/80 | HR 79 | Wt 273.0 lb

## 2015-04-13 DIAGNOSIS — M5441 Lumbago with sciatica, right side: Secondary | ICD-10-CM | POA: Diagnosis not present

## 2015-04-13 DIAGNOSIS — M5442 Lumbago with sciatica, left side: Secondary | ICD-10-CM

## 2015-04-13 DIAGNOSIS — I10 Essential (primary) hypertension: Secondary | ICD-10-CM

## 2015-04-13 DIAGNOSIS — K760 Fatty (change of) liver, not elsewhere classified: Secondary | ICD-10-CM | POA: Diagnosis not present

## 2015-04-13 DIAGNOSIS — K219 Gastro-esophageal reflux disease without esophagitis: Secondary | ICD-10-CM

## 2015-04-13 DIAGNOSIS — R739 Hyperglycemia, unspecified: Secondary | ICD-10-CM

## 2015-04-13 DIAGNOSIS — N3090 Cystitis, unspecified without hematuria: Secondary | ICD-10-CM

## 2015-04-13 LAB — BASIC METABOLIC PANEL
BUN: 18 mg/dL (ref 6–23)
CHLORIDE: 106 meq/L (ref 96–112)
CO2: 29 meq/L (ref 19–32)
CREATININE: 0.8 mg/dL (ref 0.40–1.20)
Calcium: 9.9 mg/dL (ref 8.4–10.5)
GFR: 92.43 mL/min (ref >60.00)
Glucose, Bld: 90 mg/dL (ref 70–99)
Potassium: 4 mEq/L (ref 3.5–5.1)
Sodium: 141 mEq/L (ref 135–145)

## 2015-04-13 LAB — TSH: TSH: 1.65 u[IU]/mL (ref 0.35–4.50)

## 2015-04-13 LAB — HEPATIC FUNCTION PANEL
ALT: 58 U/L — AB (ref 0–35)
AST: 65 U/L — AB (ref 0–37)
Albumin: 3.8 g/dL (ref 3.5–5.2)
Alkaline Phosphatase: 98 U/L (ref 39–117)
BILIRUBIN DIRECT: 0.1 mg/dL (ref 0.0–0.3)
TOTAL PROTEIN: 7.6 g/dL (ref 6.0–8.3)
Total Bilirubin: 0.5 mg/dL (ref 0.2–1.2)

## 2015-04-13 LAB — LIPID PANEL
CHOLESTEROL: 145 mg/dL (ref 0–200)
HDL: 50 mg/dL (ref >39.00)
LDL Cholesterol: 78 mg/dL (ref 0–99)
NONHDL: 95
Total CHOL/HDL Ratio: 3
Triglycerides: 83 mg/dL (ref 0.0–149.0)
VLDL: 16.6 mg/dL (ref 0.0–40.0)

## 2015-04-13 MED ORDER — LORATADINE 10 MG PO TABS: 10.0000 mg | ORAL_TABLET | Freq: Every day | ORAL | Status: DC

## 2015-04-13 MED ORDER — PANTOPRAZOLE SODIUM 40 MG PO TBEC: 40.0000 mg | DELAYED_RELEASE_TABLET | Freq: Every day | ORAL | Status: DC

## 2015-04-13 NOTE — Assessment & Plan Note (Signed)
Pt lost wt 

## 2015-04-13 NOTE — Progress Notes (Signed)
Pre visit review using our clinic review tool, if applicable. No additional management support is needed unless otherwise documented below in the visit note. 

## 2015-04-13 NOTE — Assessment & Plan Note (Signed)
Tramadol prn 

## 2015-04-13 NOTE — Assessment & Plan Note (Signed)
Wt Readings from Last 3 Encounters:  04/13/15 273 lb (123.832 kg)  01/09/15 282 lb (127.914 kg)  12/08/14 283 lb (128.368 kg)

## 2015-04-13 NOTE — Assessment & Plan Note (Signed)
On Zantac OTC - d/c Change to Protonix

## 2015-04-13 NOTE — Assessment & Plan Note (Signed)
Losartan, Norvasc, Lasix 

## 2015-04-13 NOTE — Progress Notes (Signed)
   Subjective:    HPI    F/u OA with neck pain, shoulders and knees; HTN, colon polyps - s/p colonoscopy C/o arregies sx's  F/u allergies  Review of Systems  Constitutional: Negative for chills, activity change, appetite change, fatigue and unexpected weight change.  HENT: Negative for congestion, mouth sores, postnasal drip and sinus pressure.   Eyes: Negative for visual disturbance.  Respiratory: Negative for cough and chest tightness.   Gastrointestinal: Negative for nausea and abdominal pain.  Genitourinary: Negative for frequency, difficulty urinating and vaginal pain.  Musculoskeletal: Positive for back pain (neck pain) and arthralgias. Negative for gait problem.  Skin: Negative for pallor and rash.  Neurological: Negative for dizziness, tremors, weakness, numbness and headaches.  Psychiatric/Behavioral: Negative for suicidal ideas, confusion and sleep disturbance.   Wt Readings from Last 3 Encounters:  04/13/15 273 lb (123.832 kg)  01/09/15 282 lb (127.914 kg)  12/08/14 283 lb (128.368 kg)   BP Readings from Last 3 Encounters:  04/13/15 120/80  01/09/15 131/89  12/08/14 120/80        Objective:   Physical Exam  Constitutional: She appears well-developed. No distress.  HENT:  Head: Normocephalic.  Right Ear: External ear normal.  Left Ear: External ear normal.  Nose: Nose normal.  Mouth/Throat: Oropharynx is clear and moist.  Eyes: Conjunctivae are normal. Pupils are equal, round, and reactive to light. Right eye exhibits no discharge. Left eye exhibits no discharge.  Neck: Normal range of motion. Neck supple. No JVD present. No tracheal deviation present. No thyromegaly present.  Cardiovascular: Normal rate, regular rhythm and normal heart sounds.   Pulmonary/Chest: No stridor. No respiratory distress. She has no wheezes.  Abdominal: Soft. Bowel sounds are normal. She exhibits no distension and no mass. There is no tenderness. There is no rebound and no  guarding.  Musculoskeletal: She exhibits no edema or tenderness.  Lymphadenopathy:    She has no cervical adenopathy.  Neurological: She displays normal reflexes. No cranial nerve deficit. She exhibits normal muscle tone. Coordination normal.  Skin: No rash noted. No erythema.  Psychiatric: She has a normal mood and affect. Her behavior is normal. Judgment and thought content normal.  Obese   Lab Results  Component Value Date   WBC 4.7 08/08/2014   HGB 12.8 08/08/2014   HCT 39.6 08/08/2014   PLT 189.0 08/08/2014   GLUCOSE 95 08/08/2014   CHOL 153 08/08/2014   TRIG 91.0 08/08/2014   HDL 52.90 08/08/2014   LDLCALC 82 08/08/2014   ALT 66* 08/08/2014   AST 62* 08/08/2014   NA 140 08/08/2014   K 4.2 08/08/2014   CL 105 08/08/2014   CREATININE 0.9 08/08/2014   BUN 16 08/08/2014   CO2 22 08/08/2014   TSH 1.11 08/08/2014   INR 2.2* 03/17/2008   HGBA1C 5.9 08/08/2014        Assessment & Plan:

## 2015-04-16 NOTE — Progress Notes (Signed)
Left message with dr plotnikovs note, advised pt to call back if any further questions

## 2015-05-16 ENCOUNTER — Ambulatory Visit
Admission: RE | Admit: 2015-05-16 | Discharge: 2015-05-16 | Disposition: A | Discharge status: Home / Self Care | Payer: Medicare Other | Source: Ambulatory Visit

## 2015-05-16 DIAGNOSIS — Z1231 Encounter for screening mammogram for malignant neoplasm of breast: Secondary | ICD-10-CM

## 2015-08-14 ENCOUNTER — Ambulatory Visit (INDEPENDENT_AMBULATORY_CARE_PROVIDER_SITE_OTHER): Payer: Medicare Other | Admitting: Internal Medicine

## 2015-08-14 ENCOUNTER — Other Ambulatory Visit (INDEPENDENT_AMBULATORY_CARE_PROVIDER_SITE_OTHER): Payer: Medicare Other

## 2015-08-14 ENCOUNTER — Encounter: Payer: Self-pay | Admitting: Internal Medicine

## 2015-08-14 VITALS — BP 130/76 | HR 80 | Wt 279.0 lb

## 2015-08-14 DIAGNOSIS — R740 Nonspecific elevation of levels of transaminase and lactic acid dehydrogenase [LDH]: Secondary | ICD-10-CM

## 2015-08-14 DIAGNOSIS — I10 Essential (primary) hypertension: Secondary | ICD-10-CM

## 2015-08-14 DIAGNOSIS — R7989 Other specified abnormal findings of blood chemistry: Secondary | ICD-10-CM

## 2015-08-14 DIAGNOSIS — R739 Hyperglycemia, unspecified: Secondary | ICD-10-CM

## 2015-08-14 DIAGNOSIS — R7402 Elevation of levels of lactic acid dehydrogenase (LDH): Secondary | ICD-10-CM

## 2015-08-14 DIAGNOSIS — R945 Abnormal results of liver function studies: Principal | ICD-10-CM

## 2015-08-14 LAB — BASIC METABOLIC PANEL
BUN: 15 mg/dL (ref 6–23)
CALCIUM: 9.9 mg/dL (ref 8.4–10.5)
CO2: 29 meq/L (ref 19–32)
CREATININE: 0.81 mg/dL (ref 0.40–1.20)
Chloride: 104 mEq/L (ref 96–112)
GFR: 91.02 mL/min (ref >60.00)
GLUCOSE: 104 mg/dL — AB (ref 70–99)
Potassium: 4.3 mEq/L (ref 3.5–5.1)
Sodium: 139 mEq/L (ref 135–145)

## 2015-08-14 LAB — HEPATIC FUNCTION PANEL
ALBUMIN: 3.8 g/dL (ref 3.5–5.2)
ALK PHOS: 163 U/L — AB (ref 39–117)
ALT: 449 U/L — ABNORMAL HIGH (ref 0–35)
AST: 454 U/L — ABNORMAL HIGH (ref 0–37)
Bilirubin, Direct: 0.3 mg/dL (ref 0.0–0.3)
TOTAL PROTEIN: 8.1 g/dL (ref 6.0–8.3)
Total Bilirubin: 0.9 mg/dL (ref 0.2–1.2)

## 2015-08-14 LAB — HEMOGLOBIN A1C: Hgb A1c MFr Bld: 5.8 % (ref 4.6–6.5)

## 2015-08-14 MED ORDER — AMLODIPINE BESYLATE 5 MG PO TABS: 5.0000 mg | ORAL_TABLET | Freq: Every day | ORAL | Status: DC

## 2015-08-14 MED ORDER — PANTOPRAZOLE SODIUM 40 MG PO TBEC: 40.0000 mg | DELAYED_RELEASE_TABLET | Freq: Every day | ORAL | Status: DC

## 2015-08-14 MED ORDER — LORATADINE 10 MG PO TABS: 10.0000 mg | ORAL_TABLET | Freq: Every day | ORAL | Status: DC

## 2015-08-14 MED ORDER — LOSARTAN POTASSIUM 100 MG PO TABS: 100.0000 mg | ORAL_TABLET | Freq: Every day | ORAL | Status: DC

## 2015-08-14 MED ORDER — FUROSEMIDE 20 MG PO TABS: 20.0000 mg | ORAL_TABLET | Freq: Every day | ORAL | Status: DC

## 2015-08-14 MED ORDER — POTASSIUM CHLORIDE CRYS ER 20 MEQ PO TBCR: 20.0000 meq | EXTENDED_RELEASE_TABLET | Freq: Every day | ORAL | Status: DC

## 2015-08-14 NOTE — Assessment & Plan Note (Signed)
Low carb diet 

## 2015-08-14 NOTE — Progress Notes (Signed)
   Subjective:    HPI    F/u OA with neck pain, shoulders and knees; HTN, colon polyps - s/p colonoscopy Doing water aerobics 2 h a day 5d/wk   Review of Systems  Constitutional: Negative for chills, activity change, appetite change, fatigue and unexpected weight change.  HENT: Negative for congestion, mouth sores, postnasal drip and sinus pressure.   Eyes: Negative for visual disturbance.  Respiratory: Negative for cough and chest tightness.   Gastrointestinal: Negative for nausea and abdominal pain.  Genitourinary: Negative for frequency, difficulty urinating and vaginal pain.  Musculoskeletal: Positive for back pain (neck pain) and arthralgias. Negative for gait problem.  Skin: Negative for pallor and rash.  Neurological: Negative for dizziness, tremors, weakness, numbness and headaches.  Psychiatric/Behavioral: Negative for suicidal ideas, confusion and sleep disturbance.   Wt Readings from Last 3 Encounters:  08/14/15 279 lb (126.554 kg)  04/13/15 273 lb (123.832 kg)  01/09/15 282 lb (127.914 kg)   BP Readings from Last 3 Encounters:  08/14/15 130/76  04/13/15 120/80  01/09/15 131/89        Objective:   Physical Exam  Constitutional: She appears well-developed. No distress.  HENT:  Head: Normocephalic.  Right Ear: External ear normal.  Left Ear: External ear normal.  Nose: Nose normal.  Mouth/Throat: Oropharynx is clear and moist.  Eyes: Conjunctivae are normal. Pupils are equal, round, and reactive to light. Right eye exhibits no discharge. Left eye exhibits no discharge.  Neck: Normal range of motion. Neck supple. No JVD present. No tracheal deviation present. No thyromegaly present.  Cardiovascular: Normal rate, regular rhythm and normal heart sounds.   Pulmonary/Chest: No stridor. No respiratory distress. She has no wheezes.  Abdominal: Soft. Bowel sounds are normal. She exhibits no distension and no mass. There is no tenderness. There is no rebound and no  guarding.  Musculoskeletal: She exhibits no edema or tenderness.  Lymphadenopathy:    She has no cervical adenopathy.  Neurological: She displays normal reflexes. No cranial nerve deficit. She exhibits normal muscle tone. Coordination normal.  Skin: No rash noted. No erythema.  Psychiatric: She has a normal mood and affect. Her behavior is normal. Judgment and thought content normal.  Obese   Lab Results  Component Value Date   WBC 4.7 08/08/2014   HGB 12.8 08/08/2014   HCT 39.6 08/08/2014   PLT 189.0 08/08/2014   GLUCOSE 90 04/13/2015   CHOL 145 04/13/2015   TRIG 83.0 04/13/2015   HDL 50.00 04/13/2015   LDLCALC 78 04/13/2015   ALT 58* 04/13/2015   AST 65* 04/13/2015   NA 141 04/13/2015   K 4.0 04/13/2015   CL 106 04/13/2015   CREATININE 0.80 04/13/2015   BUN 18 04/13/2015   CO2 29 04/13/2015   TSH 1.65 04/13/2015   INR 2.2* 03/17/2008   HGBA1C 5.9 08/08/2014        Assessment & Plan:

## 2015-08-14 NOTE — Assessment & Plan Note (Signed)
A1c

## 2015-08-14 NOTE — Assessment & Plan Note (Signed)
Losartan, Norvasc, Lasix 

## 2015-08-14 NOTE — Progress Notes (Signed)
Pre visit review using our clinic review tool, if applicable. No additional management support is needed unless otherwise documented below in the visit note. 

## 2015-08-14 NOTE — Assessment & Plan Note (Addendum)
Labs - much worse Start another w/up

## 2015-08-15 ENCOUNTER — Encounter: Payer: Self-pay | Admitting: Internal Medicine

## 2015-08-15 DIAGNOSIS — R945 Abnormal results of liver function studies: Principal | ICD-10-CM

## 2015-08-15 DIAGNOSIS — R7989 Other specified abnormal findings of blood chemistry: Secondary | ICD-10-CM | POA: Insufficient documentation

## 2015-08-15 NOTE — Assessment & Plan Note (Signed)
Worse Repeat US Labs

## 2015-08-17 ENCOUNTER — Telehealth: Payer: Self-pay | Admitting: Internal Medicine

## 2015-08-17 NOTE — Telephone Encounter (Signed)
Patient returning your call  She can be reached at [phone]

## 2015-08-17 NOTE — Telephone Encounter (Signed)
Lab result given, please note under lab tab.

## 2015-08-24 ENCOUNTER — Ambulatory Visit
Admission: RE | Admit: 2015-08-24 | Discharge: 2015-08-24 | Disposition: A | Discharge status: Home / Self Care | Payer: Medicare Other | Source: Ambulatory Visit | Attending: Internal Medicine | Admitting: Internal Medicine

## 2015-08-24 DIAGNOSIS — R945 Abnormal results of liver function studies: Principal | ICD-10-CM

## 2015-08-24 DIAGNOSIS — K769 Liver disease, unspecified: Secondary | ICD-10-CM | POA: Diagnosis not present

## 2015-08-24 DIAGNOSIS — R7989 Other specified abnormal findings of blood chemistry: Secondary | ICD-10-CM

## 2015-08-24 DIAGNOSIS — B1920 Unspecified viral hepatitis C without hepatic coma: Secondary | ICD-10-CM | POA: Diagnosis not present

## 2015-08-31 ENCOUNTER — Other Ambulatory Visit (INDEPENDENT_AMBULATORY_CARE_PROVIDER_SITE_OTHER): Payer: Medicare Other

## 2015-08-31 DIAGNOSIS — R7989 Other specified abnormal findings of blood chemistry: Secondary | ICD-10-CM | POA: Diagnosis not present

## 2015-08-31 DIAGNOSIS — R945 Abnormal results of liver function studies: Principal | ICD-10-CM

## 2015-08-31 LAB — HEPATIC FUNCTION PANEL
ALBUMIN: 3.5 g/dL (ref 3.5–5.2)
ALK PHOS: 129 U/L — AB (ref 39–117)
ALT: 294 U/L — ABNORMAL HIGH (ref 0–35)
AST: 255 U/L — AB (ref 0–37)
BILIRUBIN DIRECT: 0.3 mg/dL (ref 0.0–0.3)
BILIRUBIN TOTAL: 0.7 mg/dL (ref 0.2–1.2)
Total Protein: 7.6 g/dL (ref 6.0–8.3)

## 2015-08-31 LAB — HEPATITIS C ANTIBODY: HCV Ab: REACTIVE — AB

## 2015-08-31 LAB — FERRITIN: FERRITIN: 338.3 ng/mL — AB (ref 10.0–291.0)

## 2015-08-31 LAB — HEPATITIS B SURFACE ANTIGEN: HEP B S AG: NEGATIVE

## 2015-08-31 LAB — HEPATITIS B SURFACE ANTIBODY,QUALITATIVE: Hep B S Ab: POSITIVE — AB

## 2015-09-04 LAB — CERULOPLASMIN: Ceruloplasmin: 33 mg/dL (ref 18–53)

## 2015-09-05 LAB — HEPATITIS C RNA QUANTITATIVE
HCV QUANT LOG: 6.01 {Log} — AB (ref <1.18)
HCV Quantitative: 1013300 IU/mL — ABNORMAL HIGH (ref <15)

## 2015-09-07 ENCOUNTER — Ambulatory Visit (INDEPENDENT_AMBULATORY_CARE_PROVIDER_SITE_OTHER): Payer: Medicare Other

## 2015-09-07 DIAGNOSIS — Z23 Encounter for immunization: Secondary | ICD-10-CM | POA: Diagnosis not present

## 2015-10-26 ENCOUNTER — Other Ambulatory Visit: Payer: Self-pay | Admitting: Internal Medicine

## 2015-10-26 DIAGNOSIS — B182 Chronic viral hepatitis C: Secondary | ICD-10-CM

## 2015-10-31 DIAGNOSIS — K769 Liver disease, unspecified: Secondary | ICD-10-CM | POA: Diagnosis not present

## 2015-10-31 DIAGNOSIS — R7989 Other specified abnormal findings of blood chemistry: Secondary | ICD-10-CM | POA: Diagnosis not present

## 2015-11-28 ENCOUNTER — Other Ambulatory Visit: Payer: Medicare Other

## 2015-11-28 DIAGNOSIS — B182 Chronic viral hepatitis C: Secondary | ICD-10-CM

## 2015-11-28 LAB — CBC WITH DIFFERENTIAL/PLATELET
BASOS ABS: 0 10*3/uL (ref 0.0–0.1)
Basophils Relative: 1 % (ref 0–1)
EOS ABS: 0.1 10*3/uL (ref 0.0–0.7)
EOS PCT: 3 % (ref 0–5)
HEMATOCRIT: 40.8 % (ref 36.0–46.0)
Hemoglobin: 13.4 g/dL (ref 12.0–15.0)
LYMPHS ABS: 1.5 10*3/uL (ref 0.7–4.0)
LYMPHS PCT: 34 % (ref 12–46)
MCH: 27 pg (ref 26.0–34.0)
MCHC: 32.8 g/dL (ref 30.0–36.0)
MCV: 82.3 fL (ref 78.0–100.0)
MONO ABS: 0.6 10*3/uL (ref 0.1–1.0)
MONOS PCT: 13 % — AB (ref 3–12)
MPV: 11.2 fL (ref 8.6–12.4)
Neutro Abs: 2.1 10*3/uL (ref 1.7–7.7)
Neutrophils Relative %: 49 % (ref 43–77)
PLATELETS: 217 10*3/uL (ref 150–400)
RBC: 4.96 MIL/uL (ref 3.87–5.11)
RDW: 14.4 % (ref 11.5–15.5)
WBC: 4.3 10*3/uL (ref 4.0–10.5)

## 2015-11-28 LAB — COMPREHENSIVE METABOLIC PANEL
ALK PHOS: 124 U/L (ref 33–130)
ALT: 188 U/L — AB (ref 6–29)
AST: 190 U/L — ABNORMAL HIGH (ref 10–35)
Albumin: 3.3 g/dL — ABNORMAL LOW (ref 3.6–5.1)
BUN: 14 mg/dL (ref 7–25)
CALCIUM: 8.9 mg/dL (ref 8.6–10.4)
CHLORIDE: 107 mmol/L (ref 98–110)
CO2: 22 mmol/L (ref 20–31)
Creat: 0.73 mg/dL (ref 0.50–0.99)
GLUCOSE: 94 mg/dL (ref 65–99)
POTASSIUM: 3.9 mmol/L (ref 3.5–5.3)
Sodium: 141 mmol/L (ref 135–146)
Total Bilirubin: 0.8 mg/dL (ref 0.2–1.2)
Total Protein: 7.2 g/dL (ref 6.1–8.1)

## 2015-11-28 LAB — IRON: Iron: 90 ug/dL (ref 45–160)

## 2015-11-29 LAB — HEPATITIS A ANTIBODY, TOTAL: HEP A TOTAL AB: REACTIVE — AB

## 2015-11-29 LAB — PROTIME-INR
INR: 1.02 (ref <1.50)
PROTHROMBIN TIME: 13.5 s (ref 11.6–15.2)

## 2015-11-29 LAB — HIV ANTIBODY (ROUTINE TESTING W REFLEX): HIV: NONREACTIVE

## 2015-11-29 LAB — ANA: Anti Nuclear Antibody(ANA): NEGATIVE

## 2015-11-29 LAB — HEPATITIS B CORE ANTIBODY, TOTAL: HEP B C TOTAL AB: REACTIVE — AB

## 2015-12-01 LAB — HCV RNA,LIPA RFLX NS5A DRUG RESIST

## 2015-12-05 ENCOUNTER — Ambulatory Visit (INDEPENDENT_AMBULATORY_CARE_PROVIDER_SITE_OTHER): Payer: Medicare Other | Admitting: Internal Medicine

## 2015-12-05 ENCOUNTER — Encounter: Payer: Medicare Other | Admitting: Internal Medicine

## 2015-12-05 ENCOUNTER — Encounter: Payer: Self-pay | Admitting: Internal Medicine

## 2015-12-05 VITALS — BP 152/99 | HR 98 | Temp 98.7°F | Ht 62.5 in | Wt 288.0 lb

## 2015-12-05 DIAGNOSIS — B182 Chronic viral hepatitis C: Secondary | ICD-10-CM | POA: Diagnosis not present

## 2015-12-05 DIAGNOSIS — K7689 Other specified diseases of liver: Secondary | ICD-10-CM

## 2015-12-05 DIAGNOSIS — K769 Liver disease, unspecified: Secondary | ICD-10-CM

## 2015-12-05 MED ORDER — LEDIPASVIR-SOFOSBUVIR 90-400 MG PO TABS: 1.0000 | ORAL_TABLET | Freq: Every day | ORAL | Status: DC

## 2015-12-05 NOTE — Progress Notes (Signed)
[facility]   CC: consideration for treatment for chronic hepatitis C  HPI:  +Samantha [name] is a 64 y.o. female who presents for initial evaluation and management of chronic hepatitis C.  Patient tested positive several. Hepatitis C-associated risk factors present are: none. Patient denies history of blood transfusion, intranasal drug use, IV drug abuse, renal dialysis, sexual contact with person with liver disease, tattoos. Patient has had other studies performed. Results: hepatitis C RNA by PCR, result: positive. Patient has not had prior treatment for Hepatitis C. Patient does not have a past history of liver disease. Patient does not have a family history of liver disease. Patient does not  have associated signs or symptoms related to liver disease.  Labs reviewed and confirm chronic hepatitis C with a positive viral load.   Records reviewed from PCP.  She does take ppi but thinks she can go without for the 12 weeks with diet modifications.       Patient does have documented immunity to Hepatitis A. Patient does have documented immunity to Hepatitis B.    Review of Systems:   Constitutional: negative for malaise Gastrointestinal: negative for diarrhea Musculoskeletal: negative for myalgias and arthralgias All other systems reviewed and are negative      Past Medical History  Diagnosis Date  . Hepatitis C   . Hypertension   . Elevated glucose   . LBP (low back pain)   . Obesity   . Osteoarthritis     B knees  . Hyperlipidemia 2011  . GERD (gastroesophageal reflux disease)   . Allergy     seasonal    Prior to Admission medications   Medication Sig Start Date End Date Taking? Authorizing Provider  amLODipine (NORVASC) 5 MG tablet Take 1 tablet (5 mg total) by mouth daily. 08/14/15  Yes Aleksei Plotnikov V, MD  aspirin 81 MG tablet Take 81 mg by mouth daily.     Yes Historical Provider, MD  Cholecalciferol (EQL VITAMIN D3) 1000 UNITS tablet  Take 1,000 Units by mouth daily.     Yes Historical Provider, MD  fluticasone (FLONASE) 50 MCG/ACT nasal spray Place 2 sprays into the nose daily. 03/23/12  Yes Aleksei Plotnikov V, MD  furosemide (LASIX) 20 MG tablet Take 1 tablet (20 mg total) by mouth daily. 08/14/15  Yes Aleksei Plotnikov V, MD  loratadine (CLARITIN) 10 MG tablet Take 1 tablet (10 mg total) by mouth daily. 08/14/15  Yes Aleksei Plotnikov V, MD  losartan (COZAAR) 100 MG tablet Take 1 tablet (100 mg total) by mouth daily. 08/14/15  Yes Aleksei Plotnikov V, MD  Multiple Vitamin (MULTIVITAMIN WITH MINERALS) TABS tablet Take 1 tablet by mouth daily.   Yes Historical Provider, MD  pantoprazole (PROTONIX) 40 MG tablet Take 1 tablet (40 mg total) by mouth daily. 08/14/15  Yes Aleksei Plotnikov V, MD  potassium chloride SA (K-DUR,KLOR-CON) 20 MEQ tablet Take 1 tablet (20 mEq total) by mouth daily. 08/14/15  Yes Aleksei Plotnikov V, MD  traMADol (ULTRAM) 50 MG tablet Take 1-2 tablets (50-100 mg total) by mouth 2 (two) times daily as needed for pain. Maximum dose= 8 tablets per day 08/04/13  Yes Aleksei Plotnikov V, MD  Ledipasvir-Sofosbuvir (HARVONI) 90-400 MG TABS Take 1 tablet by mouth daily. 12/05/15   Thayer Headings, MD    Allergies  Allergen Reactions  . Benazepril Hcl     REACTION: cough  . Codeine Itching    Social History  Substance Use Topics  .  Smoking status: Former Smoker    Quit date: 12/30/2007  . Smokeless tobacco: Never Used  . Alcohol Use: 0.0 oz/week    2-3 Glasses of wine per week    Family History  Problem Relation Age of Onset  . Diabetes Other   . Hypertension Other   . Hypertension Mother   . Ovarian cancer Mother   . Hypertension Father   . Colon cancer Neg Hx   . Esophageal cancer Neg Hx   . Rectal cancer Neg Hx   . Stomach cancer Neg Hx   No liver cirrhosis, no liver cancer   Objective:  Constitutional: in no apparent distress and alert,  Filed Vitals:   12/05/15 0958  BP: 152/99  Pulse: 98    Temp: 98.7 F (37.1 C)   Eyes: anicteric Cardiovascular: Cor RRR and No murmurs Respiratory: CTA B; normal respiratory effort Gastrointestinal: Bowel sounds are normal, liver is not enlarged, spleen is not enlarged Musculoskeletal: peripheral pulses normal, no pedal edema, no clubbing or cyanosis Skin: negative for - jaundice, spider hemangioma, telangiectasia, palmar erythema, ecchymosis and atrophy; no porphyria cutanea tarda Lymphatic: no cervical lymphadenopathy   Laboratory Genotype: No results found for: HCVGENOTYPE HCV viral load:  Lab Results  Component Value Date   HCVQUANT 1013300* 08/31/2015   Lab Results  Component Value Date   WBC 4.3 11/28/2015   HGB 13.4 11/28/2015   HCT 40.8 11/28/2015   MCV 82.3 11/28/2015   PLT 217 11/28/2015    Lab Results  Component Value Date   CREATININE 0.73 11/28/2015   BUN 14 11/28/2015   NA 141 11/28/2015   K 3.9 11/28/2015   CL 107 11/28/2015   CO2 22 11/28/2015    Lab Results  Component Value Date   ALT 188* 11/28/2015   AST 190* 11/28/2015   ALKPHOS 124 11/28/2015     Labs and history reviewed and show CHILD-PUGH A  5-6 points: Child class A 7-9 points: Child class B 10-15 points: Child class C  Lab Results  Component Value Date   INR 1.02 11/28/2015   BILITOT 0.8 11/28/2015   ALBUMIN 3.3* 11/28/2015     Assessment: New Patient with Chronic Hepatitis C genotype 1b, untreated.  I discussed with the patient the lab findings that confirm chronic hepatitis C as well as the natural history and progression of disease including about 30% of people who develop cirrhosis of the liver if left untreated and once cirrhosis is established there is a 2-7% risk per year of liver cancer and liver failure.  I discussed the importance of treatment and benefits in reducing the risk, even if significant liver fibrosis exists.   Plan: 1) Patient counseled extensively on limiting acetaminophen to no more than 2 grams daily,  avoidance of alcohol. 2) Transmission discussed with patient including sexual transmission, sharing razors and toothbrush.   3) Will need referral to gastroenterology if concern for cirrhosis 4) Will need referral for substance abuse counseling: No.; Further work up to include urine drug screen  No. 5) Will prescribe Harvoni for 12 weeks 6) Hepatitis A vaccine No. 7) Hepatitis B vaccine No. 8) Pneumovax vaccine if concern for cirrhosis 9) Further work up to include MRI based on finding from ultrasound 10) will follow up after MRI; will

## 2015-12-05 NOTE — Patient Instructions (Signed)
Date 12/05/2015  Dear Samantha [name], As discussed in the [facility], your hepatitis C therapy will include the following medications:          Harvoni 90mg /400mg  tablet:           Take 1 tablet by mouth once daily   Please note that ALL MEDICATIONS WILL START ON THE SAME DATE for a total of 12 weeks. ---------------------------------------------------------------- Your HCV Treatment Start Date: TBA   Your HCV genotype:  1b    Liver Fibrosis: TBD    ---------------------------------------------------------------- YOUR PHARMACY CONTACT:   [place] [facility] Phone: [phone] Hours: Monday to Friday 7:30 am to 6:00 pm   Please always contact your pharmacy at least 3-4 business days before you run out of medications to ensure your next month's medication is ready or 1 week prior to running out if you receive it by mail.  Remember, each prescription is for 28 days. ---------------------------------------------------------------- GENERAL NOTES REGARDING YOUR HEPATITIS C MEDICATION:  SOFOSBUVIR/LEDIPASVIR (HARVONI): - Harvoni tablet is taken daily with OR without food. - The tablets are orange. - The tablets should be stored at room temperature.  - Acid reducing agents such as H2 blockers (ie. Pepcid (famotidine), Zantac (ranitidine), Tagamet (cimetidine), Axid (nizatidine) and proton pump inhibitors (ie. Prilosec (omeprazole), Protonix (pantoprazole), Nexium (esomeprazole), or Aciphex (rabeprazole)) can decrease effectiveness of Harvoni. Do not take until you have discussed with a health care provider.    -Antacids that contain magnesium and/or aluminum hydroxide (ie. Milk of Magensia, Rolaids, Gaviscon, Maalox, Mylanta, an dArthritis Pain Formula)can reduce absorption of Harvoni, so take them at least 4 hours before or after Harvoni.  -Calcium carbonate (calcium supplements or antacids such as Tums, Caltrate,  Os-Cal)needs to be taken at least 4 hours hours before or after Harvoni.  -St. John's wort or any products that contain St. John's wort like some herbal supplements  Please inform the office prior to starting any of these medications.  - The common side effects associated with Harvoni include:      1. Fatigue      2. Headache      3. Nausea      4. Diarrhea      5. Insomnia  Please note that this only lists the most common side effects and is NOT a comprehensive list of the potential side effects of these medications. For more information, please review the drug information sheets that come with your medication package from the pharmacy.  ---------------------------------------------------------------- GENERAL HELPFUL HINTS ON HCV THERAPY: 1. Stay well-hydrated. 2. Notify the [facility] of any changes in your other over-the-counter/herbal or prescription medications. 3. If you miss a dose of your medication, take the missed dose as soon as you remember. Return to your regular time/dose schedule the next day.  4.  Do not stop taking your medications without first talking with your healthcare provider. 5.  You may take Tylenol (acetaminophen), as long as the dose is less than 2000 mg (OR no more than 4 tablets of the Tylenol Extra Strengths 500mg  tablet) in 24 hours. 6.  You will see our pharmacist-specialist within the first 2 weeks of starting your medication. 7.  You will need to obtain routine labs around week 4 and12 weeks after starting and then 3 to 6 months after finishing Harvoni.    Samantha [name], [place] [facility] [place],  [place]  [id number] [phone]

## 2015-12-10 ENCOUNTER — Telehealth: Payer: Self-pay | Admitting: Pharmacist Clinician (PhC)/ Clinical Pharmacy Specialist

## 2015-12-10 ENCOUNTER — Telehealth: Payer: Self-pay | Admitting: *Deleted

## 2015-12-10 NOTE — Telephone Encounter (Signed)
Patient notified of appointment for MRI at [facility] on 12/18/15 at 1:45 pm. Nothing to eat or drink 4 hours prior to test. Samantha [name]

## 2015-12-10 NOTE — Telephone Encounter (Signed)
Samantha [name] was approved for Harvoni today she has been on Protonix for GERD. She stated that when she attempted to be off of it in the past, she has symptoms. I told her to stop the PPI (protonix) for now and to take Zantac 150mg  qday at bedtime then take Harvoni in AM. She is going to f/u with me in 2 wks.

## 2015-12-12 ENCOUNTER — Encounter: Payer: Self-pay | Admitting: Pharmacy Technician

## 2015-12-17 ENCOUNTER — Encounter: Payer: Self-pay | Admitting: Internal Medicine

## 2015-12-17 ENCOUNTER — Ambulatory Visit (INDEPENDENT_AMBULATORY_CARE_PROVIDER_SITE_OTHER): Payer: Medicare Other | Admitting: Internal Medicine

## 2015-12-17 VITALS — BP 112/72 | HR 100 | Wt 281.0 lb

## 2015-12-17 DIAGNOSIS — R739 Hyperglycemia, unspecified: Secondary | ICD-10-CM

## 2015-12-17 DIAGNOSIS — B182 Chronic viral hepatitis C: Secondary | ICD-10-CM | POA: Diagnosis not present

## 2015-12-17 DIAGNOSIS — I10 Essential (primary) hypertension: Secondary | ICD-10-CM | POA: Diagnosis not present

## 2015-12-17 DIAGNOSIS — K219 Gastro-esophageal reflux disease without esophagitis: Secondary | ICD-10-CM

## 2015-12-17 NOTE — Assessment & Plan Note (Signed)
Chronic Losartan, Norvasc, Lasix

## 2015-12-17 NOTE — Progress Notes (Signed)
Subjective:  Patient ID: Samantha [name], female    DOB: 15-Dec-1951  Age: 64 y.o. MRN: [id number]  CC: No chief complaint on file.   HPI KIRALYN STUMBO presents for Hep C on Harvoni, HTN, LBP/OA f/u  Outpatient Prescriptions Prior to Visit  Medication Sig Dispense Refill  . amLODipine (NORVASC) 5 MG tablet Take 1 tablet (5 mg total) by mouth daily. 90 tablet 3  . aspirin 81 MG tablet Take 81 mg by mouth daily.      . Cholecalciferol (EQL VITAMIN D3) 1000 UNITS tablet Take 1,000 Units by mouth daily.      . fluticasone (FLONASE) 50 MCG/ACT nasal spray Place 2 sprays into the nose daily. 16 g 4  . furosemide (LASIX) 20 MG tablet Take 1 tablet (20 mg total) by mouth daily. 90 tablet 3  . Ledipasvir-Sofosbuvir (HARVONI) 90-400 MG TABS Take 1 tablet by mouth daily. 28 tablet 2  . loratadine (CLARITIN) 10 MG tablet Take 1 tablet (10 mg total) by mouth daily. 100 tablet 3  . losartan (COZAAR) 100 MG tablet Take 1 tablet (100 mg total) by mouth daily. 90 tablet 3  . Multiple Vitamin (MULTIVITAMIN WITH MINERALS) TABS tablet Take 1 tablet by mouth daily.    . potassium chloride SA (K-DUR,KLOR-CON) 20 MEQ tablet Take 1 tablet (20 mEq total) by mouth daily. 90 tablet 3  . traMADol (ULTRAM) 50 MG tablet Take 1-2 tablets (50-100 mg total) by mouth 2 (two) times daily as needed for pain. Maximum dose= 8 tablets per day 100 tablet 3  . pantoprazole (PROTONIX) 40 MG tablet Take 1 tablet (40 mg total) by mouth daily. (Patient not taking: Reported on 12/17/2015) 90 tablet 3   No facility-administered medications prior to visit.    ROS Review of Systems  Constitutional: Positive for fatigue. Negative for chills, activity change, appetite change and unexpected weight change.  HENT: Negative for congestion, mouth sores and sinus pressure.   Eyes: Negative for visual disturbance.  Respiratory: Negative for cough and chest tightness.   Gastrointestinal: Negative for nausea and abdominal pain.    Genitourinary: Negative for frequency, difficulty urinating and vaginal pain.  Musculoskeletal: Negative for back pain and gait problem.  Skin: Negative for pallor and rash.  Neurological: Negative for dizziness, tremors, weakness, numbness and headaches.  Psychiatric/Behavioral: Negative for suicidal ideas, confusion and sleep disturbance.    Objective:  BP 112/72 mmHg  Pulse 100  Wt 281 lb (127.461 kg)  SpO2 94%  BP Readings from Last 3 Encounters:  12/17/15 112/72  12/05/15 152/99  08/14/15 130/76    Wt Readings from Last 3 Encounters:  12/17/15 281 lb (127.461 kg)  12/05/15 288 lb (130.636 kg)  08/14/15 279 lb (126.554 kg)    Physical Exam  Constitutional: She appears well-developed. No distress.  HENT:  Head: Normocephalic.  Right Ear: External ear normal.  Left Ear: External ear normal.  Nose: Nose normal.  Mouth/Throat: Oropharynx is clear and moist.  Eyes: Conjunctivae are normal. Pupils are equal, round, and reactive to light. Right eye exhibits no discharge. Left eye exhibits no discharge.  Neck: Normal range of motion. Neck supple. No JVD present. No tracheal deviation present. No thyromegaly present.  Cardiovascular: Normal rate, regular rhythm and normal heart sounds.   Pulmonary/Chest: No stridor. No respiratory distress. She has no wheezes.  Abdominal: Soft. Bowel sounds are normal. She exhibits no distension and no mass. There is no tenderness. There is no rebound and no guarding.  Musculoskeletal: She  exhibits no edema or tenderness.  Lymphadenopathy:    She has no cervical adenopathy.  Neurological: She displays normal reflexes. No cranial nerve deficit. She exhibits normal muscle tone. Coordination normal.  Skin: No rash noted. No erythema.  Psychiatric: She has a normal mood and affect. Her behavior is normal. Judgment and thought content normal.    Lab Results  Component Value Date   WBC 4.3 11/28/2015   HGB 13.4 11/28/2015   HCT 40.8  11/28/2015   PLT 217 11/28/2015   GLUCOSE 94 11/28/2015   CHOL 145 04/13/2015   TRIG 83.0 04/13/2015   HDL 50.00 04/13/2015   LDLCALC 78 04/13/2015   ALT 188* 11/28/2015   AST 190* 11/28/2015   NA 141 11/28/2015   K 3.9 11/28/2015   CL 107 11/28/2015   CREATININE 0.73 11/28/2015   BUN 14 11/28/2015   CO2 22 11/28/2015   TSH 1.65 04/13/2015   INR 1.02 11/28/2015   HGBA1C 5.8 08/14/2015    US Abdomen Complete  08/24/2015  CLINICAL DATA:  Elevated LFTs.  Hepatitis C. EXAM: ULTRASOUND ABDOMEN COMPLETE COMPARISON:  None. FINDINGS: Gallbladder: Cholecystectomy. Common bile duct: Diameter: 6.7 mm Liver: 3.6 x 3.2 x 4.0 cm hypoechoic right hepatic lesion. This is not a definite simple cyst. IVC: No abnormality visualized. Pancreas: Visualized portion unremarkable. Spleen: Size and appearance within normal limits. Right Kidney: Length: 10.9 cm. Mild cortical thinning. Echogenicity within normal limits. No mass or hydronephrosis visualized. Left Kidney: Length: 10.7 cm. Mild cortical thinning. Echogenicity within normal limits. No mass or hydronephrosis visualized. Abdominal aorta: No aneurysm visualized. Other findings: None. IMPRESSION: 1. 3.6 x 3.2 x 4.0 cm hypoechoic lesion in the right lobe of the liver. Gadolinium-enhanced MRI of the abdomen is suggested for further evaluation. 2. Prior cholecystectomy.  No biliary distention. 3. Mild bilateral renal cortical thinning. Electronically Signed   By: Marcello Moores  Register   On: 08/24/2015 10:17    Assessment & Plan:   Diagnoses and all orders for this visit:  Chronic hepatitis C without hepatic coma ([place])  Essential hypertension  Gastroesophageal reflux disease without esophagitis  Hyperglycemia   I have discontinued Ms. Newhouse's HARVONI. I am also having her maintain her aspirin, Cholecalciferol, fluticasone, traMADol, multivitamin with minerals, amLODipine, furosemide, loratadine, losartan, pantoprazole, potassium chloride SA, and  Ledipasvir-Sofosbuvir.  Meds ordered this encounter  Medications  . DISCONTD: HARVONI 90-400 MG TABS    Sig: Take 1 tablet by mouth daily.    Refill:  2     Follow-up: Return in about 4 months (around 04/16/2016) for a follow-up visit.  Walker Kehr, MD

## 2015-12-17 NOTE — Assessment & Plan Note (Signed)
Monitoring

## 2015-12-17 NOTE — Assessment & Plan Note (Signed)
2016 on Harvoni

## 2015-12-17 NOTE — Assessment & Plan Note (Signed)
On Zantac only

## 2015-12-17 NOTE — Progress Notes (Signed)
Pre visit review using our clinic review tool, if applicable. No additional management support is needed unless otherwise documented below in the visit note. 

## 2015-12-18 ENCOUNTER — Ambulatory Visit (HOSPITAL_COMMUNITY)
Admission: RE | Admit: 2015-12-18 | Discharge: 2015-12-18 | Disposition: A | Discharge status: Home / Self Care | Payer: Medicare Other | Source: Ambulatory Visit | Attending: Internal Medicine | Admitting: Internal Medicine

## 2015-12-18 DIAGNOSIS — B182 Chronic viral hepatitis C: Secondary | ICD-10-CM | POA: Diagnosis not present

## 2015-12-18 DIAGNOSIS — R935 Abnormal findings on diagnostic imaging of other abdominal regions, including retroperitoneum: Secondary | ICD-10-CM | POA: Diagnosis not present

## 2015-12-18 DIAGNOSIS — R7989 Other specified abnormal findings of blood chemistry: Secondary | ICD-10-CM | POA: Diagnosis not present

## 2015-12-18 DIAGNOSIS — K769 Liver disease, unspecified: Secondary | ICD-10-CM | POA: Diagnosis not present

## 2015-12-18 MED ORDER — GADOXETATE DISODIUM 0.25 MMOL/ML IV SOLN
13.0000 mL | Freq: Once | INTRAVENOUS | Status: AC | PRN
  Administered 2015-12-18: 13 mL via INTRAVENOUS

## 2015-12-20 ENCOUNTER — Encounter: Payer: Self-pay | Admitting: Internal Medicine

## 2015-12-20 ENCOUNTER — Ambulatory Visit (INDEPENDENT_AMBULATORY_CARE_PROVIDER_SITE_OTHER): Payer: Medicare Other | Admitting: Internal Medicine

## 2015-12-20 VITALS — Ht 63.0 in | Wt 281.0 lb

## 2015-12-20 DIAGNOSIS — B182 Chronic viral hepatitis C: Secondary | ICD-10-CM | POA: Diagnosis not present

## 2015-12-20 DIAGNOSIS — C220 Liver cell carcinoma: Secondary | ICD-10-CM | POA: Diagnosis not present

## 2015-12-20 NOTE — Assessment & Plan Note (Addendum)
Diagnosed by MRI though no tissue confirmation.  I will refer her to oncology at [facility] to decide on appropriate consideration of treatment (surgery or IR) and to hepatology as well.   I spent 25 minutes with the patient counseling on the findings, process, treatment options and referrals.  She has family support and is going to see her son after this.

## 2015-12-20 NOTE — Assessment & Plan Note (Signed)
Continue with Harvoni and follow up with me in early January.

## 2015-12-20 NOTE — Progress Notes (Signed)
   Subjective:    Patient ID: Samantha [name], female    DOB: Jul 03, 1951, 64 y.o.   MRN: [id number]  HPI Here for follow up of her MRI.  She was seen 12/7 for evaluation of hepatitis C.  She has genotype 1b, viral load of 1 million.  Hepatitis A and B immune, persistent transaminitis up to 400s and an ultrasound in August with a hypoechoic lesion in the right lobe of the liver.  I ordered an MRI of the liver and it is consistent with a liver mass concerning for hepatocellular carcinoma.  It was also notable for cirrhosis.  She also started Harvoni already last week.  No issues with the medication.    Review of Systems     Objective:   Physical Exam        Assessment & Plan:

## 2015-12-21 LAB — AFP TUMOR MARKER: AFP TUMOR MARKER: 21.7 ng/mL — AB (ref <6.1)

## 2015-12-25 ENCOUNTER — Telehealth: Payer: Self-pay | Admitting: *Deleted

## 2015-12-25 NOTE — Telephone Encounter (Signed)
Oncology Nurse Navigator Documentation  Oncology Nurse Navigator Flowsheets 12/25/2015  Referral date to RadOnc/MedOnc 12/20/2015  Navigator Encounter Type Introductory phone call  Spoke with patient and provided new patient appointment for 01/07/16 at 11:00 with Dr. Burr Medico. Informed of location of [place], valet service, and registration process. Reminded to bring insurance cards and a current medication list, including supplements. Patient verbalizes understanding.

## 2015-12-30 DIAGNOSIS — C229 Malignant neoplasm of liver, not specified as primary or secondary: Secondary | ICD-10-CM

## 2015-12-30 DIAGNOSIS — K760 Fatty (change of) liver, not elsewhere classified: Secondary | ICD-10-CM

## 2015-12-30 HISTORY — DX: Malignant neoplasm of liver, not specified as primary or secondary: C22.9

## 2015-12-30 HISTORY — DX: Fatty (change of) liver, not elsewhere classified: K76.0

## 2016-01-02 ENCOUNTER — Other Ambulatory Visit: Payer: Medicare Other

## 2016-01-02 DIAGNOSIS — B182 Chronic viral hepatitis C: Secondary | ICD-10-CM | POA: Diagnosis not present

## 2016-01-02 LAB — COMPLETE METABOLIC PANEL WITH GFR
ALT: 29 U/L (ref 6–29)
AST: 33 U/L (ref 10–35)
Albumin: 3.7 g/dL (ref 3.6–5.1)
Alkaline Phosphatase: 106 U/L (ref 33–130)
BUN: 12 mg/dL (ref 7–25)
CALCIUM: 9.3 mg/dL (ref 8.6–10.4)
CHLORIDE: 109 mmol/L (ref 98–110)
CO2: 22 mmol/L (ref 20–31)
Creat: 0.79 mg/dL (ref 0.50–0.99)
GFR, Est Non African American: 78 mL/min (ref >=60)
Glucose, Bld: 92 mg/dL (ref 65–99)
POTASSIUM: 4 mmol/L (ref 3.5–5.3)
Sodium: 141 mmol/L (ref 135–146)
Total Bilirubin: 0.5 mg/dL (ref 0.2–1.2)
Total Protein: 7.4 g/dL (ref 6.1–8.1)

## 2016-01-02 LAB — CBC WITH DIFFERENTIAL/PLATELET
BASOS PCT: 1 % (ref 0–1)
Basophils Absolute: 0 10*3/uL (ref 0.0–0.1)
Eosinophils Absolute: 0.1 10*3/uL (ref 0.0–0.7)
Eosinophils Relative: 3 % (ref 0–5)
HEMATOCRIT: 42.8 % (ref 36.0–46.0)
HEMOGLOBIN: 13.6 g/dL (ref 12.0–15.0)
LYMPHS PCT: 39 % (ref 12–46)
Lymphs Abs: 1.9 10*3/uL (ref 0.7–4.0)
MCH: 26.9 pg (ref 26.0–34.0)
MCHC: 31.8 g/dL (ref 30.0–36.0)
MCV: 84.6 fL (ref 78.0–100.0)
MONO ABS: 0.5 10*3/uL (ref 0.1–1.0)
MONOS PCT: 11 % (ref 3–12)
MPV: 10.8 fL (ref 8.6–12.4)
NEUTROS ABS: 2.2 10*3/uL (ref 1.7–7.7)
NEUTROS PCT: 46 % (ref 43–77)
PLATELETS: 205 10*3/uL (ref 150–400)
RBC: 5.06 MIL/uL (ref 3.87–5.11)
RDW: 13.6 % (ref 11.5–15.5)
WBC: 4.8 10*3/uL (ref 4.0–10.5)

## 2016-01-03 LAB — HEPATITIS C RNA QUANTITATIVE
HCV QUANT LOG: 2.09 {Log} — AB (ref <1.18)
HCV QUANT: 124 [IU]/mL — AB (ref <15)

## 2016-01-04 MED FILL — *HARVONI 90-400 MG TABLET: 90-400 | 28 days supply | Qty: 28 | Fill #1

## 2016-01-07 ENCOUNTER — Encounter: Payer: Self-pay | Admitting: *Deleted

## 2016-01-07 ENCOUNTER — Encounter: Payer: Self-pay | Admitting: Hematology

## 2016-01-07 ENCOUNTER — Ambulatory Visit (HOSPITAL_BASED_OUTPATIENT_CLINIC_OR_DEPARTMENT_OTHER): Payer: Medicare Other | Admitting: Hematology

## 2016-01-07 VITALS — BP 155/97 | HR 102 | Temp 98.5°F | Resp 18 | Ht 63.0 in | Wt 283.0 lb

## 2016-01-07 DIAGNOSIS — C220 Liver cell carcinoma: Secondary | ICD-10-CM

## 2016-01-07 NOTE — Progress Notes (Signed)
[place]  Telephone:([phone]) [phone] Fax:([phone]) [place] Note   Patient Care Team: Cassandria Anger, MD as PCP - General Sable Feil, MD as Attending Physician (Gastroenterology) Frederik Pear, MD as Adriana Mccallum (Orthopedic Surgery) Everett Graff, MD (Obstetrics and Gynecology) 01/07/2016  REFERRAL PHYSICIAN: Dr. Linus Salmons   CHIEF COMPLAINTS/PURPOSE OF CONSULTATION:  [facility]   Oncology History   Hepatocellular carcinoma [facility])   Staging form: Liver (Excluding Intrahepatic Bile Ducts), AJCC 7th Edition     Clinical stage from 12/18/2015: Stage I (T1, N0, M0) - Signed by Truitt Merle, MD on 01/13/2016       Hepatocellular carcinoma ([place])   12/18/2015 Initial Diagnosis Hepatocellular carcinoma ([place])   12/18/2015 Imaging Liver MRI with and without contrast showed a 3.8 x 4.8 cm mass in the right hepatic lobe segment 8, imaging features is consistent with hepatocellular carcinoma. No significant adenopathy or metastasis in abdomen. Mild cirrhosis.   12/20/2015 Tumor Marker AFP 21.7    HISTORY OF PRESENTING ILLNESS:  Samantha [name] 65 y.o. female is here because of recently diagnosed [place].   She was diagnosed with Hep C 30 year ago. He was not treated until recently. She has been on Harvoni for 30 days now under Dr. Henreitta Leber care, she is asymptomatic and feels well overall. Abdominal ultrasound was obtained on 08/24/2015 as a routine follow-up, which showed a 3.6 x 4.0 cm hypoechoic chronic right hepatic lesion. She underwent further imaging study with MRI of the liver on 12/18/2015, which showed a 3.8 x 4.8 cm enhancing mass in the right hepatic lobe segment 8, imaging features was consistent with hepatocellular carcinoma. No significant adenopathy or metastasis the abdomen. Her serum AFP was elevated at 21.7.   She feels well overall, denies any pain, nausea, abdominal discomfort or other symptoms. She is very physically active and functions very well  at home.  MEDICAL HISTORY:  Past Medical History  Diagnosis Date  . Hepatitis C   . Hypertension   . Elevated glucose   . LBP (low back pain)   . Obesity   . Osteoarthritis     B knees  . Hyperlipidemia 2011  . GERD (gastroesophageal reflux disease)   . Allergy     seasonal    SURGICAL HISTORY: Past Surgical History  Procedure Laterality Date  . Right knee replacement  2007  . Left shoulder arthroplasty    . Total knee replacement l  03-13-08  . Cholecystectomy    . Tubal ligation    . Joint replacement    . Colonoscopy N/A 01/09/2015    Procedure: COLONOSCOPY;  Surgeon: Jerene Bears, MD;  Location: [facility];  Service: Gastroenterology;  Laterality: N/A;    SOCIAL HISTORY: Social History   Social History  . Marital Status: Single    Spouse Name: N/A  . Number of Children: N/A  . Years of Education: N/A   Occupational History  . Not on file.   Social History Main Topics  . Smoking status: Former Smoker -- 0.25 packs/day for 20 years    Quit date: 12/30/2007  . Smokeless tobacco: Never Used  . 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 Drug Use: No  . Sexual Activity: Not Currently   Other Topics Concern  . Not on file   Social History Narrative  Regular exercise: walk a few times a week   Caffeine use: none    FAMILY HISTORY: Family History  Problem Relation Age of Onset  . Diabetes Other   . Hypertension Other   . Hypertension Mother   . Ovarian cancer Mother   . Hypertension Father   . Cancer Father 84    prostate cancer   . Colon cancer Neg Hx   . Esophageal cancer Neg Hx   . Rectal cancer Neg Hx   . Stomach cancer Neg Hx     ALLERGIES:  is allergic to benazepril hcl and codeine.  MEDICATIONS:  Current Outpatient Prescriptions  Medication Sig Dispense Refill  . acetaminophen (TYLENOL) 500 MG tablet Take 500 mg by mouth  every 6 (six) hours as needed.    Marland Kitchen amLODipine (NORVASC) 5 MG tablet Take 1 tablet (5 mg total) by mouth daily. 90 tablet 3  . aspirin 81 MG tablet Take 81 mg by mouth daily.      . Cholecalciferol (EQL VITAMIN D3) 1000 UNITS tablet Take 1,000 Units by mouth daily.      . fluticasone (FLONASE) 50 MCG/ACT nasal spray Place 2 sprays into the nose daily. 16 g 4  . furosemide (LASIX) 20 MG tablet Take 1 tablet (20 mg total) by mouth daily. 90 tablet 3  . Ledipasvir-Sofosbuvir (HARVONI) 90-400 MG TABS Take 1 tablet by mouth daily. 28 tablet 2  . loratadine (CLARITIN) 10 MG tablet Take 1 tablet (10 mg total) by mouth daily. 100 tablet 3  . losartan (COZAAR) 100 MG tablet Take 1 tablet (100 mg total) by mouth daily. 90 tablet 3  . Multiple Vitamin (MULTIVITAMIN WITH MINERALS) TABS tablet Take 1 tablet by mouth daily.    . pantoprazole (PROTONIX) 40 MG tablet Take 1 tablet (40 mg total) by mouth daily. 90 tablet 3  . potassium chloride SA (K-DUR,KLOR-CON) 20 MEQ tablet Take 1 tablet (20 mEq total) by mouth daily. 90 tablet 3   No current facility-administered medications for this visit.    REVIEW OF SYSTEMS:   Constitutional: Denies fevers, chills or abnormal night sweats Eyes: Denies blurriness of vision, double vision or watery eyes Ears, nose, mouth, throat, and face: Denies mucositis or sore throat Respiratory: Denies cough, dyspnea or wheezes Cardiovascular: Denies palpitation, chest discomfort or lower extremity swelling Gastrointestinal:  Denies nausea, heartburn or change in bowel habits Skin: Denies abnormal skin rashes Lymphatics: Denies new lymphadenopathy or easy bruising Neurological:Denies numbness, tingling or new weaknesses Behavioral/Psych: Mood is stable, no new changes  All other systems were reviewed with the patient and are negative.  PHYSICAL EXAMINATION: ECOG PERFORMANCE STATUS: 0 - Asymptomatic  Filed Vitals:   01/07/16 1057  BP: 155/97  Pulse: 102  Temp: 98.5 F  (36.9 C)  Resp: 18   Filed Weights   01/07/16 1057  Weight: 283 lb (128.368 kg)    GENERAL:alert, no distress and comfortable SKIN: skin color, texture, turgor are normal, no rashes or significant lesions EYES: normal, conjunctiva are pink and non-injected, sclera clear OROPHARYNX:no exudate, no erythema and lips, buccal mucosa, and tongue normal  NECK: supple, thyroid normal size, non-tender, without nodularity LYMPH:  no palpable lymphadenopathy in the cervical, axillary or inguinal LUNGS: clear to auscultation and percussion with normal breathing effort HEART: regular rate & rhythm and no murmurs and no lower extremity edema ABDOMEN:abdomen soft, non-tender and normal bowel sounds Musculoskeletal:no cyanosis of digits and no clubbing  PSYCH: alert & oriented x 3 with fluent speech NEURO: no  focal motor/sensory deficits  LABORATORY DATA:  I have reviewed the data as listed Lab Results  Component Value Date   WBC 4.8 01/02/2016   HGB 13.6 01/02/2016   HCT 42.8 01/02/2016   MCV 84.6 01/02/2016   PLT 205 01/02/2016    Recent Labs  04/13/15 0928 08/14/15 1027 08/31/15 1034 11/28/15 1028 01/02/16 0945  NA 141 139  --  141 141  K 4.0 4.3  --  3.9 4.0  CL 106 104  --  107 109  CO2 29 29  --  22 22  GLUCOSE 90 104*  --  94 92  BUN 18 15  --  14 12  CREATININE 0.80 0.81  --  0.73 0.79  CALCIUM 9.9 9.9  --  8.9 9.3  GFRNONAA  --   --   --   --  78  GFRAA  --   --   --   --  >89  PROT 7.6 8.1 7.6 7.2 7.4  ALBUMIN 3.8 3.8 3.5 3.3* 3.7  AST 65* 454* 255* 190* 33  ALT 58* 449* 294* 188* 29  ALKPHOS 98 163* 129* 124 106  BILITOT 0.5 0.9 0.7 0.8 0.5  BILIDIR 0.1 0.3 0.3  --   --    Status: Finalresult Visible to patient:  MyChart Nextappt: 01/17/2016 at 01:00 PM in Radiology ([facility] 2) Dx:  Chronic hepatitis C without hepatic c...            Ref Range 3wk ago    AFP-Tumor Marker <6.1 ng/mL 21.7 (H)         RADIOGRAPHIC STUDIES: I have  personally reviewed the radiological images as listed and agreed with the findings in the report. Mr Liver W Wo Contrast  12/18/2015  CLINICAL DATA:  Hepatitis-C. Abnormal abdominal ultrasound. Elevated LFTs. EXAM: MRI ABDOMEN WITHOUT AND WITH CONTRAST TECHNIQUE: Multiplanar multisequence MR imaging of the abdomen was performed both before and after the administration of intravenous contrast. CONTRAST:  13 mL Eovist COMPARISON:  Ultrasound 08/24/2015 FINDINGS: Lower chest:  Lung bases are clear. Hepatobiliary: Well circumscribed rounded lesion in the RIGHT hepatic lobe is mildly hyperintense on T2 weighted imaging measuring 38 x 48 mm on image 20, series 13. Lesion measures 38 mm in craniocaudad dimension. Lesion demonstrates intense early uniform enhancement with some central nonenhancing nodularity (image 24, series 801). Lesion demonstrates rapid washout on the subsequent vascular sequences (series 802 and 803). On the 20 minutes delayed vascular sequences, the lesion clearly does not accumulate the hepatocytes specific contrast agent (image 28, series 14). No additional lesions are identified on the contrast enhanced series. The liver is mildly lobular. The caudate lobe is mildly enlarged. No hepatic steatosis identified. The portal veins are patent. The enhancing lesion resides in segment 8. Pancreas: No pancreatic lesion.  No pancreatic duct dilatation. Spleen: Normal spleen. Adrenals/urinary tract: Adrenal glands and kidneys are normal. Stomach/Bowel: Stomach and limited of the small bowel is unremarkable Vascular/Lymphatic: Abdominal aorta is normal caliber. There is no retroperitoneal or periportal lymphadenopathy. Musculoskeletal: No aggressive osseous lesion IMPRESSION: 1. Solitary early enhancing lesion the RIGHT hepatic lobe (segment 8) which does not accumulate the hepatocytes specific imaging agent is consistent with hepatocellular carcinoma. 2. Mild morphologic changes of cirrhosis. These results  will be called to the ordering clinician or representative by the Radiologist Assistant, and communication documented in the PACS or zVision Dashboard. Electronically Signed   By: Suzy Bouchard M.D.   On: 12/18/2015 16:57    ASSESSMENT & PLAN:  65 year old female, with past medical history of hepatitis C, currently on antivirals therapy, presented with imaging discovered right hepatic mass. She is asymptomatic  1. Hepatocellular carcinoma of right lobe, cT1N0M0, stage I -I reviewed her MRI scan findings, and her lab test results, including AFP, with patient in great detail. Given her underlying hepatitis C, which is a high risk for HCC, typical image findings, and elevated AFP, her diagnosis of [place] is quite certain. I do not feel she needs tissue diagnosis to confirm. -She likely has T1N0 disease, I will obtain a CT chest and a bone scan to rule out distant metastasis. -We did discuss the treatment option of surgical resection, liver transplant versus liver targeted therapy for her [place]. Her liver function has normalized quickly after hepatitis C treatment, she has no significant liver cirrhosis, I think she probably would be a candidate for surgical resection. -I'll present her case in our GI tumor this week, to see if she is a candidate for surgical resection, and if she needs portal vein embolization before surgery. -We discussed liver cancer surveillance after surgery. I will see her back in 3 months after her surgery.    2. Hep C -She will continue and HCV therapy and follow-up with Dr. Linus Salmons.  Plan -CT chest -discuss in GI tumor board this week and I will call her after tumor board   All questions were answered. The patient knows to call the clinic with any problems, questions or concerns. I spent 55 minutes counseling the patient face to face. The total time spent in the appointment was 60 minutes and more than 50% was on counseling.     Truitt Merle, MD 01/07/2016   Addendum Her  case was presented in GI tumor Board on 01/09/2016. Dr. Barry Dienes feels her [facility] is resectable. She recommends portal vein embolization before surgery. I'll refer her to Dr. Barry Dienes and interventional radiology.  Truitt Merle  01/10/2016

## 2016-01-07 NOTE — Progress Notes (Signed)
Oncology Nurse Navigator Documentation  Oncology Nurse Navigator Flowsheets 01/07/2016  Navigator Location [facility]-Med Onc  Navigator Encounter Type Initial MedOnc  Abnormal Finding Date 08/24/2015  Confirmed Diagnosis Date 12/18/2015  Patient Visit Type MedOnc  Treatment Phase Abnormal Scans;Treatment  Barriers/Navigation Needs Education;Family concerns  Education Actor Options;Coping with Diagnosis/ Prognosis;Newly Diagnosed Cancer Education  Interventions Referrals  Referrals Social Work  Support Groups/Services GI Support Sales executive  Acuity Level 1  Time Spent with Patient 83  Met with patient and room mate during new patient visit. Explained the role of the GI Nurse Navigator and provided New Patient Packet with information on: 1. [place] cancer 2. Support groups 3. Advanced Directives 4. Fall Safety Plan Answered questions, reviewed current treatment plan using TEACH back and provided emotional support. Provided copy of current treatment plan.  Merceda Elks, RN, BSN GI Oncology [place]

## 2016-01-09 ENCOUNTER — Other Ambulatory Visit: Payer: Self-pay | Admitting: *Deleted

## 2016-01-09 DIAGNOSIS — C220 Liver cell carcinoma: Secondary | ICD-10-CM

## 2016-01-10 ENCOUNTER — Ambulatory Visit (INDEPENDENT_AMBULATORY_CARE_PROVIDER_SITE_OTHER): Payer: Medicare Other | Admitting: Internal Medicine

## 2016-01-10 ENCOUNTER — Encounter: Payer: Self-pay | Admitting: Internal Medicine

## 2016-01-10 VITALS — BP 135/90 | HR 97 | Temp 98.1°F | Wt 282.0 lb

## 2016-01-10 DIAGNOSIS — C220 Liver cell carcinoma: Secondary | ICD-10-CM | POA: Diagnosis not present

## 2016-01-10 DIAGNOSIS — B182 Chronic viral hepatitis C: Secondary | ICD-10-CM | POA: Diagnosis not present

## 2016-01-10 NOTE — Assessment & Plan Note (Addendum)
Waiting for review of the case on how best to proceed.  I appreciate Dr. Ernestina Penna management.

## 2016-01-10 NOTE — Assessment & Plan Note (Signed)
Doing good on treatment, not on ppi.  Tolerating.  Early viral load down but not undetectable so I will recheck it again before she finishes to be sure it becomes undetectable.

## 2016-01-10 NOTE — Progress Notes (Signed)
   Subjective:    Patient ID: Samantha [name], female    DOB: [birth year], 65 y.o.   MRN: [id number]  HPI Here for follow up of her HCV and probable HCC.    She was seen 12/22 to discuss her MRI results and concern for cancer.  AFP also significantly elevated.  Did get in to see Dr Burr Medico and very pleased with her care.  Is hopeful for a surgical approach for the [facility].  Was referred to hepatology but she has not yet had an appt.  She is waiting for a call back from Dr. Burr Medico on decision of treatment after the tumor board.   She has genotype 1b, viral load of 1 million.  Hepatitis A and B immune, persistent transaminitis up to 400s.  IShe also started Harvoni about 4 weeks ago and early viral load down to 124.  No issues with the medication.  Tells me she has good social support and is here with her grandchild and neighbor friend.     Review of Systems  Constitutional: Negative for fatigue and unexpected weight change.  Gastrointestinal: Negative for nausea and diarrhea.  Skin: Negative for rash.  Neurological: Negative for dizziness and light-headedness.       Objective:   Physical Exam  Constitutional: She appears well-developed and well-nourished. No distress.  Eyes: No scleral icterus.  Cardiovascular: Normal rate, regular rhythm and normal heart sounds.   No murmur heard. Pulmonary/Chest: Effort normal and breath sounds normal. No respiratory distress.  Skin: No rash noted.          Assessment & Plan:

## 2016-01-13 ENCOUNTER — Encounter: Payer: Self-pay | Admitting: Hematology

## 2016-01-16 ENCOUNTER — Encounter: Payer: Self-pay | Admitting: *Deleted

## 2016-01-16 NOTE — Progress Notes (Signed)
Message from Dr. Marlowe Aschoff office that Samantha [name] is seeing her on 01/24/16 in their [place] office.

## 2016-01-17 ENCOUNTER — Ambulatory Visit (HOSPITAL_COMMUNITY)
Admission: RE | Admit: 2016-01-17 | Discharge: 2016-01-17 | Disposition: A | Discharge status: Home / Self Care | Payer: Medicare Other | Source: Ambulatory Visit | Attending: Hematology | Admitting: Hematology

## 2016-01-17 DIAGNOSIS — R911 Solitary pulmonary nodule: Secondary | ICD-10-CM | POA: Diagnosis not present

## 2016-01-17 DIAGNOSIS — C220 Liver cell carcinoma: Secondary | ICD-10-CM | POA: Diagnosis not present

## 2016-01-17 DIAGNOSIS — R590 Localized enlarged lymph nodes: Secondary | ICD-10-CM | POA: Insufficient documentation

## 2016-01-17 DIAGNOSIS — J849 Interstitial pulmonary disease, unspecified: Secondary | ICD-10-CM | POA: Insufficient documentation

## 2016-01-22 ENCOUNTER — Ambulatory Visit
Admission: RE | Admit: 2016-01-22 | Discharge: 2016-01-22 | Disposition: A | Discharge status: Home / Self Care | Payer: Medicare Other | Source: Ambulatory Visit | Attending: Hematology | Admitting: Hematology

## 2016-01-22 DIAGNOSIS — C220 Liver cell carcinoma: Secondary | ICD-10-CM | POA: Diagnosis not present

## 2016-01-22 NOTE — Consult Note (Signed)
Chief Complaint: Patient was seen in consultation today for  Chief Complaint  Patient presents with  . Advice Only    Consultfor portal vein embolization   at the request of Samantha,[name]  Referring Physician(s): Samantha,[name]  History of Present Illness: Samantha [name] is a 65 y.o. female with a history of chronic hepatitis C.  She was initially diagnosed 30 years ago but only recently began curative therapy with Harvoni under the care of Dr. Linus Salmons. She has been in treatment for almost 2 months since December 2016.  Screening abdominal ultrasound demonstrated a 4 cm hypoechoic mass. Further imaging evaluation with MRI of the liver on 12/18/2015 diagnosed a 4.8 cm arterially enhancing mass in hepatic segment 8 with portal venous washout consistent with hepatocellular carcinoma. Her AFP is minimally elevated at 21.  She is currently completely asymptomatic. She denies abdominal pain, nausea, vomiting, weight loss, night sweats or other systemic symptoms. Her functional status is excellent. She is very active.  She was presented at tumor board earlier this month and based on her initial imaging thought to be a potential resection candidate. Her future liver remnant is borderline in size. If she is a surgical candidate and undergoes right hepatectomy, she would benefit from portal vein embolization prior. She presents today to discuss portal vein embolization.  Past Medical History  Diagnosis Date  . Hepatitis C   . Hypertension   . Elevated glucose   . LBP (low back pain)   . Obesity   . Osteoarthritis     B knees  . Hyperlipidemia 2011  . GERD (gastroesophageal reflux disease)   . Allergy     seasonal    Past Surgical History  Procedure Laterality Date  . Right knee replacement  2007  . Left shoulder arthroplasty    . Total knee replacement l  03-13-08  . Cholecystectomy    . Tubal ligation    . Joint replacement    . Colonoscopy N/A 01/09/2015    Procedure: COLONOSCOPY;   Surgeon: Jerene Bears, MD;  Location: [facility];  Service: Gastroenterology;  Laterality: N/A;    Allergies: Benazepril hcl and Codeine  Medications: Prior to Admission medications   Medication Sig Start Date End Date Taking? Authorizing Provider  acetaminophen (TYLENOL) 500 MG tablet Take 500 mg by mouth every 6 (six) hours as needed.   Yes Historical Provider, MD  amLODipine (NORVASC) 5 MG tablet Take 1 tablet (5 mg total) by mouth daily. 08/14/15  Yes Aleksei Plotnikov V, MD  aspirin 81 MG tablet Take 81 mg by mouth daily.     Yes Historical Provider, MD  Cholecalciferol (EQL VITAMIN D3) 1000 UNITS tablet Take 1,000 Units by mouth daily.     Yes Historical Provider, MD  fluticasone (FLONASE) 50 MCG/ACT nasal spray Place 2 sprays into the nose daily. 03/23/12  Yes Aleksei Plotnikov V, MD  furosemide (LASIX) 20 MG tablet Take 1 tablet (20 mg total) by mouth daily. 08/14/15  Yes Aleksei Plotnikov V, MD  Ledipasvir-Sofosbuvir (HARVONI) 90-400 MG TABS Take 1 tablet by mouth daily. 12/05/15  Yes Thayer Headings, MD  loratadine (CLARITIN) 10 MG tablet Take 1 tablet (10 mg total) by mouth daily. 08/14/15  Yes Aleksei Plotnikov V, MD  losartan (COZAAR) 100 MG tablet Take 1 tablet (100 mg total) by mouth daily. 08/14/15  Yes Aleksei Plotnikov V, MD  Multiple Vitamin (MULTIVITAMIN WITH MINERALS) TABS tablet Take 1 tablet by mouth daily.   Yes  Historical Provider, MD  potassium chloride SA (K-DUR,KLOR-CON) 20 MEQ tablet Take 1 tablet (20 mEq total) by mouth daily. 08/14/15  Yes Aleksei Plotnikov V, MD  pantoprazole (PROTONIX) 40 MG tablet Take 1 tablet (40 mg total) by mouth daily. Patient not taking: Reported on 01/10/2016 08/14/15   Cassandria Anger, MD     Family History  Problem Relation Age of Onset  . Diabetes Other   . Hypertension Other   . Hypertension Mother   . Ovarian cancer Mother   . Hypertension Father   . Cancer Father 59    prostate cancer   . Colon cancer Neg Hx   . Esophageal  cancer Neg Hx   . Rectal cancer Neg Hx   . Stomach cancer Neg Hx     Social History   Social History  . Marital Status: Single    Spouse Name: N/A  . Number of Children: N/A  . Years of Education: N/A   Social History Main Topics  . Smoking status: Former Smoker -- 0.25 packs/day for 20 years    Quit date: 12/30/2007  . Smokeless tobacco: Never Used  . 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 Drug Use: No  . Sexual Activity: Not Currently   Other Topics Concern  . Not on file   Social History Narrative   Regular exercise: walk a few times a week   Caffeine use: none   Single, raising her young great granddaughter and watches grandchildren after school   Has a room mate to help prn   Retired from nursing tech--worked at [employer]   Independent/drives    ECOG Status: 0 - Asymptomatic  Review of Systems: A 12 point ROS discussed and pertinent positives are indicated in the HPI above.  All other systems are negative.  Review of Systems  Vital Signs: BP 132/78 mmHg  Pulse 82  Temp(Src) 97.9 F (36.6 C) (Oral)  Resp 13  Ht 5\' 3"  (1.6 m)  Wt 282 lb (127.914 kg)  BMI 49.97 kg/m2  SpO2 98%  Physical Exam  Constitutional: She is oriented to person, place, and time. She appears well-developed and well-nourished. No distress.  HENT:  Head: Normocephalic and atraumatic.  Eyes: Scleral icterus is present.  Cardiovascular: Normal rate.   Pulmonary/Chest: Effort normal.  Abdominal: Soft. She exhibits no distension. There is no tenderness.  Neurological: She is alert and oriented to person, place, and time.  Skin: Skin is warm and dry.  Psychiatric: She has a normal mood and affect. Her behavior is normal.  Nursing note and vitals reviewed.    Imaging: Ct Chest Wo Contrast  01/17/2016  CLINICAL DATA:  Newly diagnosed hepatocellular carcinoma. HCV. Chest staging. EXAM: CT CHEST  WITHOUT CONTRAST TECHNIQUE: Multidetector CT imaging of the chest was performed following the standard protocol without IV contrast. COMPARISON:  12/18/2015 MRI abdomen.  No prior chest CT. FINDINGS: Mediastinum/Nodes: Normal heart size. No pericardial fluid/thickening. Great vessels are normal in course and caliber. Normal visualized thyroid. Normal esophagus. No axillary adenopathy. No gross hilar adenopathy on this noncontrast study. There are a few borderline prominent right pericardiophrenic lymph nodes, largest 0.7 cm (series 2/ image 41). Otherwise no pathologically enlarged mediastinal lymph nodes. Lungs/Pleura: No pneumothorax. No pleural effusion. Posterior right middle lobe 3 mm pulmonary nodule (series 5/image 28). No acute consolidative airspace disease, additional significant pulmonary nodules or lung masses.  There patchy regions of peripheral reticulation and mild traction bronchiectasis in both lungs, which spares the immediate subpleural lungs in several locations, predominantly in the mid to lower lungs. No frank honeycombing. Upper abdomen: Hypodense 4.9 cm anterior right liver lobe mass (series 2/ image 52). Finely irregular liver surface suggests cirrhosis. Musculoskeletal: No aggressive appearing focal osseous lesions. Moderate degenerative changes in the thoracic spine. Partially visualized left shoulder arthroplasty. IMPRESSION: 1. Solitary 3 mm right middle lobe pulmonary nodule, indeterminate. Recommend follow-up chest CT in 3 months. 2. Borderline prominent right pericardiophrenic mediastinal lymph nodes, nodal metastases not exclude. 3. Re- demonstration of large right liver lobe mass and finely irregular liver surface suggestive of cirrhosis. 4. Mild interstitial lung disease characterized by patchy peripheral reticulation and mild traction bronchiectasis in both lungs, with sparing of the immediate subpleural lungs in several locations, suggestive of nonspecific interstitial pneumonia  (NSIP). Electronically Signed   By: Ilona Sorrel M.D.   On: 01/17/2016 16:47    Labs:  CBC:  Recent Labs  11/28/15 1028 01/02/16 0945  WBC 4.3 4.8  HGB 13.4 13.6  HCT 40.8 42.8  PLT 217 205    COAGS:  Recent Labs  11/28/15 1028  INR 1.02    BMP:  Recent Labs  04/13/15 0928 08/14/15 1027 11/28/15 1028 01/02/16 0945  NA 141 139 141 141  K 4.0 4.3 3.9 4.0  CL 106 104 107 109  CO2 29 29 22 22   GLUCOSE 90 104* 94 92  BUN 18 15 14 12   CALCIUM 9.9 9.9 8.9 9.3  CREATININE 0.80 0.81 0.73 0.79  GFRNONAA  --   --   --  78  GFRAA  --   --   --  >89    LIVER FUNCTION TESTS:  Recent Labs  08/14/15 1027 08/31/15 1034 11/28/15 1028 01/02/16 0945  BILITOT 0.9 0.7 0.8 0.5  AST 454* 255* 190* 33  ALT 449* 294* 188* 29  ALKPHOS 163* 129* 124 106  PROT 8.1 7.6 7.2 7.4  ALBUMIN 3.8 3.5 3.3* 3.7    TUMOR MARKERS:  Recent Labs  12/20/15 1143  AFPTM 21.7*    Assessment and Plan:  65 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   Fortunately, she has no overt signs of cirrhosis or portal hypertension by imaging. Her case was reviewed at the multidisciplinary tumor board at [facility] and our liver surgeon feels that she may be a candidate for resection.  Her future liver remnant is borderline in size and she would benefit from portal vein embolization to induce left hepatic hypertrophy prior to surgery.  I discussed the concept of portal vein embolization prior to surgery in detail with Mrs. Todd. We also discussed the importance of checking the portal pressures at the time of the procedure and that if she has portal hypertension that that will dramatically change our plan as she would no longer be a surgical candidate. In that setting, she would be a potential transplant candidate with a solitary 4.8 cm mass. However, she would likely require local regional therapy to bridge to transplant and maintain the mass below  5 cm. This would entail transarterial chemoembolization.  She voiced her understanding. I answered all of her questions to the best of my ability. She is in agreement with our plan.  She sees Dr. Barry Dienes later this week to further discuss the surgical portion of the plan.  1.) Referral to [facility] at  [facility] as she may end up being a transplant candidate.    2.) Schedule PVE at [facility] to be done asap, hopefully next week provided Dr. Barry Dienes agrees that she remains a surgical candidate after their appointment later this week.    Thank you for this interesting consult.  I greatly enjoyed meeting SAKIA BRIZUELA and look forward to participating in their care.  A copy of this report was sent to the requesting provider on this date.  Electronically Signed: [name] 01/22/2016, 9:56 AM   I spent a total of 60 Minutes  in face to face in clinical consultation, greater than 50% of which was counseling/coordinating care for [facility]

## 2016-01-23 ENCOUNTER — Telehealth (HOSPITAL_COMMUNITY): Payer: Self-pay | Admitting: Interventional Radiology

## 2016-01-23 ENCOUNTER — Other Ambulatory Visit (HOSPITAL_COMMUNITY): Payer: Self-pay | Admitting: Interventional Radiology

## 2016-01-23 DIAGNOSIS — R188 Other ascites: Principal | ICD-10-CM

## 2016-01-23 DIAGNOSIS — K7460 Unspecified cirrhosis of liver: Secondary | ICD-10-CM

## 2016-01-23 DIAGNOSIS — C220 Liver cell carcinoma: Secondary | ICD-10-CM

## 2016-01-23 NOTE — Telephone Encounter (Signed)
Called pt to scheduled procedure with Laurence Ferrari, left VM for pt to call me back. JM

## 2016-01-24 ENCOUNTER — Telehealth: Payer: Self-pay | Admitting: General Surgery

## 2016-01-24 ENCOUNTER — Other Ambulatory Visit: Payer: Self-pay | Admitting: General Surgery

## 2016-01-24 DIAGNOSIS — C220 Liver cell carcinoma: Secondary | ICD-10-CM | POA: Diagnosis not present

## 2016-01-24 NOTE — Telephone Encounter (Signed)
error 

## 2016-01-29 DIAGNOSIS — C220 Liver cell carcinoma: Secondary | ICD-10-CM | POA: Diagnosis not present

## 2016-01-29 DIAGNOSIS — K7460 Unspecified cirrhosis of liver: Secondary | ICD-10-CM | POA: Diagnosis not present

## 2016-01-31 ENCOUNTER — Other Ambulatory Visit: Payer: Self-pay | Admitting: Physician Assistant

## 2016-01-31 ENCOUNTER — Other Ambulatory Visit: Payer: Self-pay | Admitting: Interventional Radiology

## 2016-01-31 DIAGNOSIS — C220 Liver cell carcinoma: Secondary | ICD-10-CM

## 2016-01-31 MED FILL — *HARVONI 90-400 MG TABLET: 90-400 | 28 days supply | Qty: 28 | Fill #2

## 2016-02-01 ENCOUNTER — Encounter (HOSPITAL_COMMUNITY): Payer: Self-pay

## 2016-02-01 ENCOUNTER — Ambulatory Visit (HOSPITAL_COMMUNITY)
Admission: RE | Admit: 2016-02-01 | Discharge: 2016-02-01 | Disposition: A | Discharge status: Home / Self Care | Payer: Medicare Other | Source: Ambulatory Visit | Attending: Interventional Radiology | Admitting: Interventional Radiology

## 2016-02-01 ENCOUNTER — Other Ambulatory Visit (HOSPITAL_COMMUNITY): Payer: Self-pay | Admitting: Interventional Radiology

## 2016-02-01 ENCOUNTER — Ambulatory Visit (HOSPITAL_COMMUNITY): Admission: RE | Admit: 2016-02-01 | Payer: Medicare Other | Source: Ambulatory Visit

## 2016-02-01 DIAGNOSIS — C220 Liver cell carcinoma: Secondary | ICD-10-CM

## 2016-02-01 DIAGNOSIS — K7469 Other cirrhosis of liver: Secondary | ICD-10-CM | POA: Insufficient documentation

## 2016-02-01 DIAGNOSIS — K7460 Unspecified cirrhosis of liver: Secondary | ICD-10-CM

## 2016-02-01 DIAGNOSIS — R160 Hepatomegaly, not elsewhere classified: Secondary | ICD-10-CM | POA: Diagnosis not present

## 2016-02-01 DIAGNOSIS — K732 Chronic active hepatitis, not elsewhere classified: Secondary | ICD-10-CM | POA: Diagnosis not present

## 2016-02-01 DIAGNOSIS — R188 Other ascites: Principal | ICD-10-CM

## 2016-02-01 DIAGNOSIS — B1920 Unspecified viral hepatitis C without hepatic coma: Secondary | ICD-10-CM | POA: Diagnosis not present

## 2016-02-01 LAB — CBC
HEMATOCRIT: 42.9 % (ref 36.0–46.0)
HEMOGLOBIN: 13.8 g/dL (ref 12.0–15.0)
MCH: 27.5 pg (ref 26.0–34.0)
MCHC: 32.2 g/dL (ref 30.0–36.0)
MCV: 85.5 fL (ref 78.0–100.0)
Platelets: 190 10*3/uL (ref 150–400)
RBC: 5.02 MIL/uL (ref 3.87–5.11)
RDW: 13.5 % (ref 11.5–15.5)
WBC: 5.4 10*3/uL (ref 4.0–10.5)

## 2016-02-01 LAB — PROTIME-INR
INR: 1.05 (ref 0.00–1.49)
Prothrombin Time: 13.9 seconds (ref 11.6–15.2)

## 2016-02-01 LAB — APTT: aPTT: 29 seconds (ref 24–37)

## 2016-02-01 MED ORDER — FENTANYL CITRATE (PF) 100 MCG/2ML IJ SOLN
INTRAMUSCULAR | Status: AC
  Filled 2016-02-01: qty 2

## 2016-02-01 MED ORDER — MIDAZOLAM HCL 2 MG/2ML IJ SOLN
INTRAMUSCULAR | Status: AC | PRN
  Administered 2016-02-01 (×3): 1 mg via INTRAVENOUS

## 2016-02-01 MED ORDER — MIDAZOLAM HCL 2 MG/2ML IJ SOLN
INTRAMUSCULAR | Status: AC
  Filled 2016-02-01: qty 2

## 2016-02-01 MED ORDER — FENTANYL CITRATE (PF) 100 MCG/2ML IJ SOLN
INTRAMUSCULAR | Status: AC | PRN
  Administered 2016-02-01 (×2): 50 ug via INTRAVENOUS

## 2016-02-01 MED ORDER — GELATIN ABSORBABLE 12-7 MM EX MISC
CUTANEOUS | Status: AC
  Filled 2016-02-01: qty 1

## 2016-02-01 MED ORDER — SODIUM CHLORIDE 0.9 % IV SOLN
INTRAVENOUS | Status: DC
  Administered 2016-02-01: 11:00:00 via INTRAVENOUS

## 2016-02-01 MED ORDER — MIDAZOLAM HCL 2 MG/2ML IJ SOLN
INTRAMUSCULAR | Status: DC
Start: 2016-02-01 — End: 2016-02-02
  Filled 2016-02-01: qty 2

## 2016-02-01 MED ORDER — LIDOCAINE HCL (PF) 1 % IJ SOLN
INTRAMUSCULAR | Status: AC
  Filled 2016-02-01: qty 10

## 2016-02-01 NOTE — Sedation Documentation (Signed)
Bandaid R upper abd

## 2016-02-01 NOTE — Procedures (Signed)
Interventional Radiology Procedure Note  Procedure:  1.) US guided random liver biopsy 2.) US guided biopsy liver mass  Complications:  None  Estimated Blood Loss: < 25 mL  Recommendations: - Path pending - Bedrest x 3 hrs  Signed,  Criselda Peaches, MD

## 2016-02-01 NOTE — H&P (Signed)
Chief Complaint: Hepatocellular carcinoma, possible cirrhosis Referring Physician:[name] Burr Medico HPI: Samantha [name] is an 65 y.o. female with recently diagnosed [place].  Her story is a bit complex.  Initially she was felt to be a surgical candidate for a resection, but after reviewing, she was not felt to be a surgical resection candidate.  Attempts would like to be made at getting her set up for a liver transplant.  In order to do this multiple things need to happen.  One of those is a random liver biopsy to determine if she has cirrhosis.  We would also like to get a biopsy of her lesion to verify that this indeed [facility] as well.  She has presented today for these 2 biopsies.  Past Medical History:  Past Medical History  Diagnosis Date  . Hepatitis C   . Hypertension   . Elevated glucose   . LBP (low back pain)   . Obesity   . Osteoarthritis     B knees  . Hyperlipidemia 2011  . GERD (gastroesophageal reflux disease)   . Allergy     seasonal    Past Surgical History:  Past Surgical History  Procedure Laterality Date  . Right knee replacement  2007  . Left shoulder arthroplasty    . Total knee replacement l  03-13-08  . Cholecystectomy    . Tubal ligation    . Joint replacement    . Colonoscopy N/A 01/09/2015    Procedure: COLONOSCOPY;  Surgeon: Jerene Bears, MD;  Location: [facility];  Service: Gastroenterology;  Laterality: N/A;    Family History:  Family History  Problem Relation Age of Onset  . Diabetes Other   . Hypertension Other   . Hypertension Mother   . Ovarian cancer Mother   . Hypertension Father   . Cancer Father 28    prostate cancer   . Colon cancer Neg Hx   . Esophageal cancer Neg Hx   . Rectal cancer Neg Hx   . Stomach cancer Neg Hx     Social History:  reports that she quit smoking about 8 years ago. She has never used smokeless tobacco. She reports that she does not drink alcohol or use illicit drugs.  Allergies:  Allergies  Allergen Reactions  .  Benazepril Hcl     REACTION: cough  . Codeine Itching    Medications:   Medication List    ASK your doctor about these medications        acetaminophen 500 MG tablet  Commonly known as:  TYLENOL  Take 500 mg by mouth at bedtime as needed for mild pain or headache.     amLODipine 5 MG tablet  Commonly known as:  NORVASC  Take 1 tablet (5 mg total) by mouth daily.     aspirin 81 MG tablet  Take 81 mg by mouth daily.     EQL VITAMIN D3 1000 units tablet  Generic drug:  Cholecalciferol  Take 1,000 Units by mouth daily.     fluticasone 50 MCG/ACT nasal spray  Commonly known as:  FLONASE  Place 2 sprays into the nose daily.     furosemide 20 MG tablet  Commonly known as:  LASIX  Take 1 tablet (20 mg total) by mouth daily.     Ledipasvir-Sofosbuvir 90-400 MG Tabs  Commonly known as:  HARVONI  Take 1 tablet by mouth daily.     loratadine 10 MG tablet  Commonly known as:  CLARITIN  Take 1  tablet (10 mg total) by mouth daily.     losartan 100 MG tablet  Commonly known as:  COZAAR  Take 1 tablet (100 mg total) by mouth daily.     multivitamin with minerals Tabs tablet  Take 1 tablet by mouth daily.     pantoprazole 40 MG tablet  Commonly known as:  PROTONIX  Take 1 tablet (40 mg total) by mouth daily.     potassium chloride SA 20 MEQ tablet  Commonly known as:  K-DUR,KLOR-CON  Take 1 tablet (20 mEq total) by mouth daily.        Please HPI for pertinent positives, otherwise complete 10 system ROS negative.  Mallampati Score: MD Evaluation Airway: WNL Heart: WNL Abdomen: WNL Chest/ Lungs: WNL ASA  Classification: 3 Mallampati/Airway Score: One  Physical Exam: BP 131/80 mmHg  Pulse 96  Temp(Src) 97.8 F (36.6 C) (Oral)  Resp 18  Ht 5\' 3"  (1.6 m)  Wt 277 lb (125.646 kg)  BMI 49.08 kg/m2  SpO2 95% Body mass index is 49.08 kg/(m^2).  General: pleasant, obese black female who is laying in bed in NAD HEENT: head is normocephalic, atraumatic.  Sclera are  noninjected.  PERRL.  Ears and nose without any masses or lesions.  Mouth is pink and moist Heart: regular, rate, and rhythm.  Normal s1,s2. No obvious murmurs, gallops, or rubs noted.  Palpable radial and pedal pulses bilaterally Lungs: CTAB, no wheezes, rhonchi, or rales noted.  Respiratory effort nonlabored Abd: soft, NT, ND, +BS, no masses, hernias, or organomegaly, morbidly obese Skin: warm and dry with no masses, lesions, or rashes Psych: A&Ox3 with an appropriate affect.   Labs: Results for orders placed or performed during the hospital encounter of 02/01/16 (from the past 48 hour(s))  APTT upon arrival     Status: None   Collection Time: 02/01/16 11:09 AM  Result Value Ref Range   aPTT 29 24 - 37 seconds  CBC upon arrival     Status: None   Collection Time: 02/01/16 11:09 AM  Result Value Ref Range   WBC 5.4 4.0 - 10.5 K/uL   RBC 5.02 3.87 - 5.11 MIL/uL   Hemoglobin 13.8 12.0 - 15.0 g/dL   HCT 42.9 36.0 - 46.0 %   MCV 85.5 78.0 - 100.0 fL   MCH 27.5 26.0 - 34.0 pg   MCHC 32.2 30.0 - 36.0 g/dL   RDW 13.5 11.5 - 15.5 %   Platelets 190 150 - 400 K/uL  Protime-INR upon arrival     Status: None   Collection Time: 02/01/16 11:09 AM  Result Value Ref Range   Prothrombin Time 13.9 11.6 - 15.2 seconds   INR 1.05 0.00 - 1.49    Imaging: No results found.  Assessment/Plan 1. HCC, possible cirrhosis -plan today will be for a random liver biopsy and a liver lesion biopsy.  I have d/w Dr. Laurence Ferrari to confirm this. -she is NPO and not on any blood thinners -Risks and Benefits discussed with the patient including, but not limited to bleeding, infection, damage to adjacent structures or low yield requiring additional tests. All of the patient's questions were answered, patient is agreeable to proceed. Consent signed and in chart.   Thank you for this interesting consult.  I greatly enjoyed meeting JEANNINE WORTHEY and look forward to participating in their care.  A copy of this  report was sent to the requesting provider on this date.  Electronically Signed: Henreitta Cea 02/01/2016, 1:17 PM  I spent a total of    25 Minutes in face to face in clinical consultation, greater than 50% of which was counseling/coordinating care for random liver biopsy and liver lesion biopsy, HCC, mild cirrhosis

## 2016-02-01 NOTE — Discharge Instructions (Signed)
Breast Biopsy, Care After Refer to this sheet in the next few weeks. These instructions provide you with information on caring for yourself after your procedure. Your caregiver may also give you more specific instructions. Your treatment has been planned according to current medical practices, but problems sometimes occur. Call your caregiver if you have any problems or questions after your procedure. HOME CARE INSTRUCTIONS   Only take over-the-counter or prescription medicines for pain, discomfort, or fever as directed by your caregiver.  Do not take aspirin. It can cause bleeding.  Keep stitches dry when bathing.  Protect the biopsy area. Do not let the area get bumped.  Avoid activities that may pull the incision site open until approved by your caregiver. This can include stretching, reaching, exercise, sports, or lifting over 3 pounds.  Resume your usual diet.  Wear a good support bra for as long as directed by your caregiver.  Change any bandages (dressings) as directed by your caregiver.  Do not drink alcohol while taking pain medicine.  Keep all your follow-up appointments with your caregiver. Ask when your test results will be ready. Make sure you get your test results. SEEK MEDICAL CARE IF:   You have redness, swelling, or increasing pain in the biopsy site.  You have a bad smell coming from the biopsy site or dressing.  Your biopsy site breaks open after the stitches (sutures), staples, or skin adhesive strips have been removed.  You have a rash.  You need stronger medicine. SEEK IMMEDIATE MEDICAL CARE IF:   You have a fever.  You have increased bleeding (more than a small spot) from the biopsy site.  You have difficulty breathing.  You have pus coming from the biopsy site. MAKE SURE YOU:  Understand these instructions.  Will watch your condition.  Will get help right away if you are not doing well or get worse.   This information is not intended to  replace advice given to you by your health care provider. Make sure you discuss any questions you have with your health care provider.   Document Released: 07/04/2005 Document Revised: 01/05/2015 Document Reviewed: 01/15/2012 Elsevier Interactive Patient Education 2016 Else[employer] Inc[employer] Liver Biopsy, Care After Refer to this sheet in the next few weeks. These instructions provide you with information on caring for yourself after your procedure. Your health care provider may also give you more specific instructions. Your treatment has been planned according to current medical practices, but problems sometimes occur. Call your health care provider if you have any problems or questions after your procedure. WHAT TO EXPECT AFTER THE PROCEDURE After your procedure, it is typical to have the following:  A small amount of discomfort in the area where the biopsy was done and in the right shoulder or shoulder blade.  A small amount of bruising around the area where the biopsy was done and on the skin over the liver.  Sleepiness and fatigue for the rest of the day. HOME CARE INSTRUCTIONS   Rest at home for 1-2 days or as directed by your health care provider.  Have a friend or family member stay with you for at least 24 hours.  Because of the medicines used during the procedure, you should not do the following things in the first 24 hours:  Drive.  Use machinery.  Be responsible for the care of other people.  Sign legal documents.  Take a bath or shower.  There are many different ways to close and cover an incision, including  stitches, skin glue, and adhesive strips. Follow your health care provider's instructions on:  Incision care.  Bandage (dressing) changes and removal.  Incision closure removal.  Do not drink alcohol in the first week.  Do not lift more than 5 pounds or play contact sports for 2 weeks after this test.  Take medicines only as directed by your health care provider.  Do not take medicine containing aspirin or non-steroidal anti-inflammatory medicines such as ibuprofen for 1 week after this test.  It is your responsibility to get your test results. SEEK MEDICAL CARE IF:   You have increased bleeding from an incision that results in more than a small spot of blood.  You have redness, swelling, or increasing pain in any incisions.  You notice a discharge or a bad smell coming from any of your incisions.  You have a fever or chills. SEEK IMMEDIATE MEDICAL CARE IF:   You develop swelling, bloating, or pain in your abdomen.  You become dizzy or faint.  You develop a rash.  You are nauseous or vomit.  You have difficulty breathing, feel short of breath, or feel faint.  You develop chest pain.  You have problems with your speech or vision.  You have trouble balancing or moving your arms or legs.   This information is not intended to replace advice given to you by your health care provider. Make sure you discuss any questions you have with your health care provider.   Document Released: 07/04/2005 Document Revised: 01/05/2015 Document Reviewed: 02/10/2014 Elsevier Interactive Patient Education [employer].

## 2016-02-01 NOTE — Sedation Documentation (Signed)
Pt to transfer back to [facility]

## 2016-02-01 NOTE — Sedation Documentation (Signed)
Patient is resting comfortably. 

## 2016-02-01 NOTE — Sedation Documentation (Signed)
comfortable

## 2016-02-02 ENCOUNTER — Ambulatory Visit (HOSPITAL_COMMUNITY): Admission: RE | Admit: 2016-02-02 | Payer: Medicare Other | Source: Ambulatory Visit

## 2016-02-07 ENCOUNTER — Ambulatory Visit: Payer: Medicare Other | Admitting: Internal Medicine

## 2016-02-15 ENCOUNTER — Other Ambulatory Visit: Payer: Self-pay | Admitting: Interventional Radiology

## 2016-02-18 ENCOUNTER — Other Ambulatory Visit: Payer: Medicare Other

## 2016-02-18 DIAGNOSIS — B182 Chronic viral hepatitis C: Secondary | ICD-10-CM

## 2016-02-19 LAB — HEPATITIS C RNA QUANTITATIVE: HCV Quantitative: NOT DETECTED IU/mL (ref <15)

## 2016-02-20 ENCOUNTER — Other Ambulatory Visit: Payer: Self-pay | Admitting: General Surgery

## 2016-02-21 ENCOUNTER — Encounter (HOSPITAL_COMMUNITY): Payer: Self-pay

## 2016-02-21 ENCOUNTER — Ambulatory Visit (HOSPITAL_COMMUNITY)
Admission: RE | Admit: 2016-02-21 | Discharge: 2016-02-21 | Disposition: A | Discharge status: Home / Self Care | Payer: Medicare Other | Source: Ambulatory Visit | Attending: Interventional Radiology | Admitting: Interventional Radiology

## 2016-02-21 ENCOUNTER — Other Ambulatory Visit: Payer: Self-pay | Admitting: Interventional Radiology

## 2016-02-21 ENCOUNTER — Observation Stay (HOSPITAL_COMMUNITY)
Admission: AD | Admit: 2016-02-21 | Discharge: 2016-02-22 | Disposition: A | Discharge status: Home / Self Care | Payer: Medicare Other | Source: Ambulatory Visit | Attending: Interventional Radiology | Admitting: Interventional Radiology

## 2016-02-21 DIAGNOSIS — M170 Bilateral primary osteoarthritis of knee: Secondary | ICD-10-CM | POA: Diagnosis not present

## 2016-02-21 DIAGNOSIS — C220 Liver cell carcinoma: Secondary | ICD-10-CM | POA: Diagnosis not present

## 2016-02-21 DIAGNOSIS — Z7982 Long term (current) use of aspirin: Secondary | ICD-10-CM | POA: Insufficient documentation

## 2016-02-21 DIAGNOSIS — E669 Obesity, unspecified: Secondary | ICD-10-CM | POA: Insufficient documentation

## 2016-02-21 DIAGNOSIS — Z9049 Acquired absence of other specified parts of digestive tract: Secondary | ICD-10-CM | POA: Diagnosis not present

## 2016-02-21 DIAGNOSIS — B1920 Unspecified viral hepatitis C without hepatic coma: Secondary | ICD-10-CM | POA: Insufficient documentation

## 2016-02-21 DIAGNOSIS — E785 Hyperlipidemia, unspecified: Secondary | ICD-10-CM | POA: Insufficient documentation

## 2016-02-21 DIAGNOSIS — K219 Gastro-esophageal reflux disease without esophagitis: Secondary | ICD-10-CM | POA: Insufficient documentation

## 2016-02-21 DIAGNOSIS — Z79899 Other long term (current) drug therapy: Secondary | ICD-10-CM | POA: Insufficient documentation

## 2016-02-21 DIAGNOSIS — J302 Other seasonal allergic rhinitis: Secondary | ICD-10-CM | POA: Insufficient documentation

## 2016-02-21 DIAGNOSIS — Z9851 Tubal ligation status: Secondary | ICD-10-CM | POA: Insufficient documentation

## 2016-02-21 DIAGNOSIS — Z96653 Presence of artificial knee joint, bilateral: Secondary | ICD-10-CM | POA: Insufficient documentation

## 2016-02-21 DIAGNOSIS — I10 Essential (primary) hypertension: Secondary | ICD-10-CM | POA: Insufficient documentation

## 2016-02-21 DIAGNOSIS — Z6841 Body Mass Index (BMI) 40.0 and over, adult: Secondary | ICD-10-CM | POA: Insufficient documentation

## 2016-02-21 LAB — COMPREHENSIVE METABOLIC PANEL
ALT: 39 U/L (ref 14–54)
ANION GAP: 10 (ref 5–15)
AST: 43 U/L — ABNORMAL HIGH (ref 15–41)
Albumin: 3.7 g/dL (ref 3.5–5.0)
Alkaline Phosphatase: 104 U/L (ref 38–126)
BUN: 15 mg/dL (ref 6–20)
CHLORIDE: 107 mmol/L (ref 101–111)
CO2: 24 mmol/L (ref 22–32)
Calcium: 9.9 mg/dL (ref 8.9–10.3)
Creatinine, Ser: 0.91 mg/dL (ref 0.44–1.00)
Glucose, Bld: 99 mg/dL (ref 65–99)
Potassium: 3.9 mmol/L (ref 3.5–5.1)
SODIUM: 141 mmol/L (ref 135–145)
Total Bilirubin: 0.7 mg/dL (ref 0.3–1.2)
Total Protein: 8 g/dL (ref 6.5–8.1)

## 2016-02-21 LAB — CBC WITH DIFFERENTIAL/PLATELET
Basophils Absolute: 0 10*3/uL (ref 0.0–0.1)
Basophils Relative: 0 %
EOS ABS: 0.2 10*3/uL (ref 0.0–0.7)
EOS PCT: 3 %
HCT: 41.2 % (ref 36.0–46.0)
Hemoglobin: 12.9 g/dL (ref 12.0–15.0)
LYMPHS ABS: 1.9 10*3/uL (ref 0.7–4.0)
Lymphocytes Relative: 40 %
MCH: 26.9 pg (ref 26.0–34.0)
MCHC: 31.3 g/dL (ref 30.0–36.0)
MCV: 86 fL (ref 78.0–100.0)
MONO ABS: 0.7 10*3/uL (ref 0.1–1.0)
MONOS PCT: 14 %
Neutro Abs: 2 10*3/uL (ref 1.7–7.7)
Neutrophils Relative %: 43 %
PLATELETS: 189 10*3/uL (ref 150–400)
RBC: 4.79 MIL/uL (ref 3.87–5.11)
RDW: 13.9 % (ref 11.5–15.5)
WBC: 4.7 10*3/uL (ref 4.0–10.5)

## 2016-02-21 LAB — PROTIME-INR
INR: 1.02 (ref 0.00–1.49)
PROTHROMBIN TIME: 13.6 s (ref 11.6–15.2)

## 2016-02-21 MED ORDER — SODIUM CHLORIDE 0.9% FLUSH: 3.0000 mL | INTRAVENOUS | Status: DC | PRN

## 2016-02-21 MED ORDER — SODIUM CHLORIDE 0.9 % IV SOLN
INTRAVENOUS | Status: AC
Start: 2016-02-21 — End: 2016-02-21
  Administered 2016-02-21 (×2): via INTRAVENOUS

## 2016-02-21 MED ORDER — PROMETHAZINE HCL 25 MG RE SUPP: 25.0000 mg | Freq: Three times a day (TID) | RECTAL | Status: DC | PRN

## 2016-02-21 MED ORDER — DOXORUBICIN HCL CHEMO IV INJECTION 2 MG/ML: 35.0000 mL | Freq: Once | INTRAVENOUS | Status: DC

## 2016-02-21 MED ORDER — IOHEXOL 300 MG/ML  SOLN
100.0000 mL | Freq: Once | INTRAMUSCULAR | Status: AC | PRN
Start: 2016-02-21 — End: 2016-02-21
  Administered 2016-02-21: 40 mL via INTRA_ARTERIAL

## 2016-02-21 MED ORDER — SODIUM CHLORIDE 0.9 % IV SOLN: 250.0000 mL | INTRAVENOUS | Status: DC | PRN

## 2016-02-21 MED ORDER — DEXAMETHASONE SODIUM PHOSPHATE 10 MG/ML IJ SOLN
INTRAMUSCULAR | Status: AC | PRN
  Administered 2016-02-21: 4 mg via INTRAVENOUS

## 2016-02-21 MED ORDER — IOHEXOL 300 MG/ML  SOLN
100.0000 mL | Freq: Once | INTRAMUSCULAR | Status: AC | PRN
  Administered 2016-02-21: 62 mL via INTRA_ARTERIAL

## 2016-02-21 MED ORDER — PROMETHAZINE HCL 25 MG PO TABS: 25.0000 mg | ORAL_TABLET | Freq: Three times a day (TID) | ORAL | Status: DC | PRN

## 2016-02-21 MED ORDER — STERILE WATER FOR INJECTION IJ SOLN
Freq: Once | INTRAVENOUS | Status: AC
  Administered 2016-02-21: 35 mL via INTRA_ARTERIAL
  Filled 2016-02-21: qty 8.5

## 2016-02-21 MED ORDER — PIPERACILLIN-TAZOBACTAM 3.375 G IVPB
3.3750 g | INTRAVENOUS | Status: AC
  Administered 2016-02-21: 3.375 g via INTRAVENOUS
  Filled 2016-02-21: qty 50

## 2016-02-21 MED ORDER — PIPERACILLIN-TAZOBACTAM 3.375 G IVPB
3.3750 g | Freq: Three times a day (TID) | INTRAVENOUS | Status: DC
  Administered 2016-02-21 – 2016-02-22 (×2): 3.375 g via INTRAVENOUS
  Filled 2016-02-21 (×2): qty 50

## 2016-02-21 MED ORDER — FENTANYL CITRATE (PF) 100 MCG/2ML IJ SOLN
INTRAMUSCULAR | Status: AC
  Filled 2016-02-21: qty 4

## 2016-02-21 MED ORDER — NON FORMULARY
10.0000 mL | Freq: Once | Status: DC
  Administered 2016-02-21: 10 mL via INTRA_ARTERIAL

## 2016-02-21 MED ORDER — SODIUM CHLORIDE 0.9 % IV SOLN
INTRAVENOUS | Status: DC
  Administered 2016-02-21: 08:00:00 via INTRAVENOUS

## 2016-02-21 MED ORDER — MIDAZOLAM HCL 2 MG/2ML IJ SOLN
INTRAMUSCULAR | Status: AC | PRN
  Administered 2016-02-21: 1 mg via INTRAVENOUS
  Administered 2016-02-21 (×3): 0.5 mg via INTRAVENOUS
  Administered 2016-02-21: 1 mg via INTRAVENOUS
  Administered 2016-02-21 (×2): 0.5 mg via INTRAVENOUS

## 2016-02-21 MED ORDER — LIDOCAINE HCL 1 % IJ SOLN
INTRAMUSCULAR | Status: AC
  Administered 2016-02-21: 5 mL
  Filled 2016-02-21: qty 20

## 2016-02-21 MED ORDER — ONDANSETRON HCL 4 MG/2ML IJ SOLN
INTRAMUSCULAR | Status: AC | PRN
  Administered 2016-02-21: 4 mg via INTRAVENOUS

## 2016-02-21 MED ORDER — SODIUM CHLORIDE 0.9% FLUSH: 3.0000 mL | Freq: Two times a day (BID) | INTRAVENOUS | Status: DC

## 2016-02-21 MED ORDER — DOCUSATE SODIUM 100 MG PO CAPS
100.0000 mg | ORAL_CAPSULE | Freq: Two times a day (BID) | ORAL | Status: DC
  Administered 2016-02-21: 100 mg via ORAL
  Filled 2016-02-21: qty 1

## 2016-02-21 MED ORDER — GELATIN ABSORBABLE 12-7 MM EX MISC
CUTANEOUS | Status: AC | PRN
  Administered 2016-02-21: 1

## 2016-02-21 MED ORDER — ONDANSETRON HCL 4 MG/2ML IJ SOLN
4.0000 mg | Freq: Four times a day (QID) | INTRAMUSCULAR | Status: DC | PRN
  Administered 2016-02-21: 4 mg via INTRAVENOUS
  Filled 2016-02-21: qty 2

## 2016-02-21 MED ORDER — HYDROMORPHONE HCL 1 MG/ML IJ SOLN
INTRAMUSCULAR | Status: AC | PRN
  Administered 2016-02-21: 0.5 mg via INTRAVENOUS
  Administered 2016-02-21: 1 mg via INTRAVENOUS
  Administered 2016-02-21: 0.5 mg via INTRAVENOUS

## 2016-02-21 MED ORDER — HYDROCODONE-ACETAMINOPHEN 5-325 MG PO TABS
1.0000 | ORAL_TABLET | ORAL | Status: DC | PRN
  Administered 2016-02-21 – 2016-02-22 (×4): 1 via ORAL
  Filled 2016-02-21 (×4): qty 1

## 2016-02-21 MED ORDER — DIPHENHYDRAMINE HCL 50 MG/ML IJ SOLN: 25.0000 mg | Freq: Four times a day (QID) | INTRAMUSCULAR | Status: DC | PRN

## 2016-02-21 MED ORDER — MIDAZOLAM HCL 2 MG/2ML IJ SOLN
INTRAMUSCULAR | Status: AC
  Filled 2016-02-21: qty 6

## 2016-02-21 MED ORDER — HYDROMORPHONE HCL 2 MG/ML IJ SOLN
INTRAMUSCULAR | Status: AC
  Filled 2016-02-21: qty 1

## 2016-02-21 MED ORDER — DEXAMETHASONE SODIUM PHOSPHATE 10 MG/ML IJ SOLN
8.0000 mg | INTRAMUSCULAR | Status: AC
  Administered 2016-02-21: 8 mg via INTRAVENOUS
  Filled 2016-02-21 (×4): qty 1

## 2016-02-21 MED ORDER — FENTANYL CITRATE (PF) 100 MCG/2ML IJ SOLN
INTRAMUSCULAR | Status: AC | PRN
  Administered 2016-02-21: 50 ug via INTRAVENOUS

## 2016-02-21 MED ORDER — ONDANSETRON HCL 4 MG/2ML IJ SOLN
8.0000 mg | INTRAMUSCULAR | Status: AC
  Administered 2016-02-21: 8 mg via INTRAVENOUS
  Filled 2016-02-21 (×2): qty 4

## 2016-02-21 MED ORDER — ONDANSETRON HCL 4 MG/2ML IJ SOLN
INTRAMUSCULAR | Status: AC
  Filled 2016-02-21: qty 2

## 2016-02-21 MED ORDER — DEXAMETHASONE SODIUM PHOSPHATE 10 MG/ML IJ SOLN
INTRAMUSCULAR | Status: AC
  Filled 2016-02-21: qty 1

## 2016-02-21 NOTE — Sedation Documentation (Signed)
Patient denies pain and is resting comfortably.  

## 2016-02-21 NOTE — Progress Notes (Signed)
Patient ID: Samantha [name], female   DOB: January 29, 1951, 65 y.o.   MRN: [id number] Patient s/p conventional TACE this morning.  She is doing very well this afternoon.  She is not having any pain, minimal nausea.  She has already been up ambulating to the restroom.  She is getting ready to order dinner off the menu.  Plan for DC home in am with outpatient labs in about 1 week if she is doing well.  Reilley Latorre E 2:23 PM 02/21/2016

## 2016-02-21 NOTE — Sedation Documentation (Signed)
5Fr sheath removed from R fem artery by Dr. Laurence Ferrari.  Hemostasis achieved using Exoseal device.  R groin level 0, RDP Doppler +

## 2016-02-21 NOTE — Progress Notes (Signed)
Decadron gotten out of short stay pyxis, Theodis Shove, RN here to get pt, decadron vial given to [place] and she states they will give in radiology.

## 2016-02-21 NOTE — Procedures (Signed)
Interventional Radiology Procedure Note  Procedure: Conventional lipiodol TACE.   Complications: None.  Estimated Blood Loss: 0  Recommendations: - Bedrest x 4 hrs with leg straight - Hydrate with IVFs - ADAT - Labs in am  Signed,  Criselda Peaches, MD

## 2016-02-21 NOTE — H&P (Signed)
Chief Complaint: hepatocellular carcinoma Referring Physician: Dr. [name] HPI: Samantha [name] is an 65 y.o. female who is known to our service from a recent US biopsy of her liver.  She has known [place] and presents today for a conventional TACE procedure.  She has been doing well otherwise and remains active with no pain.  Past Medical History:  Past Medical History  Diagnosis Date  . Hepatitis C   . Hypertension   . Elevated glucose   . LBP (low back pain)   . Obesity   . Osteoarthritis     B knees  . Hyperlipidemia 2011  . GERD (gastroesophageal reflux disease)   . Allergy     seasonal    Past Surgical History:  Past Surgical History  Procedure Laterality Date  . Right knee replacement  2007  . Left shoulder arthroplasty    . Total knee replacement l  03-13-08  . Cholecystectomy    . Tubal ligation    . Joint replacement    . Colonoscopy N/A 01/09/2015    Procedure: COLONOSCOPY;  Surgeon: Jerene Bears, MD;  Location: [facility];  Service: Gastroenterology;  Laterality: N/A;    Family History:  Family History  Problem Relation Age of Onset  . Diabetes Other   . Hypertension Other   . Hypertension Mother   . Ovarian cancer Mother   . Hypertension Father   . Cancer Father 64    prostate cancer   . Colon cancer Neg Hx   . Esophageal cancer Neg Hx   . Rectal cancer Neg Hx   . Stomach cancer Neg Hx     Social History:  reports that she quit smoking about 8 years ago. She has never used smokeless tobacco. She reports that she does not drink alcohol or use illicit drugs.  Allergies:  Allergies  Allergen Reactions  . Benazepril Hcl     REACTION: cough  . Codeine Itching    Medications:   Medication List    ASK your doctor about these medications        acetaminophen 500 MG tablet  Commonly known as:  TYLENOL  Take 500 mg by mouth at bedtime as needed for mild pain or headache.     amLODipine 5 MG tablet  Commonly known as:  NORVASC  Take  1 tablet (5 mg total) by mouth daily.     aspirin 81 MG tablet  Take 81 mg by mouth daily.     EQL VITAMIN D3 1000 units tablet  Generic drug:  Cholecalciferol  Take 1,000 Units by mouth daily.     fluticasone 50 MCG/ACT nasal spray  Commonly known as:  FLONASE  Place 2 sprays into the nose daily.     furosemide 20 MG tablet  Commonly known as:  LASIX  Take 1 tablet (20 mg total) by mouth daily.     Ledipasvir-Sofosbuvir 90-400 MG Tabs  Commonly known as:  HARVONI  Take 1 tablet by mouth daily.     loratadine 10 MG tablet  Commonly known as:  CLARITIN  Take 1 tablet (10 mg total) by mouth daily.     losartan 100 MG tablet  Commonly known as:  COZAAR  Take 1 tablet (100 mg total) by mouth daily.     multivitamin with minerals Tabs tablet  Take 1 tablet by mouth daily.     pantoprazole 40 MG tablet  Commonly known as:  PROTONIX  Take 1 tablet (40 mg total)  by mouth daily.     potassium chloride SA 20 MEQ tablet  Commonly known as:  K-DUR,KLOR-CON  Take 1 tablet (20 mEq total) by mouth daily.        Please HPI for pertinent positives, otherwise complete 10 system ROS negative.  Mallampati Score: MD Evaluation Airway: WNL Heart: WNL Abdomen: WNL Chest/ Lungs: WNL ASA  Classification: 3 Mallampati/Airway Score: One  Physical Exam: There were no vitals taken for this visit. There is no weight on file to calculate BMI. General: pleasant, obese black female who is laying in bed in NAD HEENT: head is normocephalic, atraumatic.  Sclera are noninjected.  PERRL.  Ears and nose without any masses or lesions.  Mouth is pink and moist Heart: regular, rate, and rhythm.  Normal s1,s2. No obvious murmurs, gallops, or rubs noted.  Palpable radial and pedal pulses bilaterally Lungs: CTAB, no wheezes, rhonchi, or rales noted.  Respiratory effort nonlabored Abd: soft, NT, ND, +BS, no masses, hernias, or organomegaly, obese MS: all 4 extremities are symmetrical with no cyanosis,  clubbing.  Trace edema is present Skin: warm and dry with no masses, lesions, or rashes Psych: A&Ox3 with an appropriate affect.   Labs: Results for orders placed or performed during the hospital encounter of 02/21/16 (from the past 48 hour(s))  CBC with Differential/Platelet     Status: None   Collection Time: 02/21/16  8:00 AM  Result Value Ref Range   WBC 4.7 4.0 - 10.5 K/uL   RBC 4.79 3.87 - 5.11 MIL/uL   Hemoglobin 12.9 12.0 - 15.0 g/dL   HCT 41.2 36.0 - 46.0 %   MCV 86.0 78.0 - 100.0 fL   MCH 26.9 26.0 - 34.0 pg   MCHC 31.3 30.0 - 36.0 g/dL   RDW 13.9 11.5 - 15.5 %   Platelets 189 150 - 400 K/uL   Neutrophils Relative % 43 %   Neutro Abs 2.0 1.7 - 7.7 K/uL   Lymphocytes Relative 40 %   Lymphs Abs 1.9 0.7 - 4.0 K/uL   Monocytes Relative 14 %   Monocytes Absolute 0.7 0.1 - 1.0 K/uL   Eosinophils Relative 3 %   Eosinophils Absolute 0.2 0.0 - 0.7 K/uL   Basophils Relative 0 %   Basophils Absolute 0.0 0.0 - 0.1 K/uL  Protime-INR     Status: None   Collection Time: 02/21/16  8:00 AM  Result Value Ref Range   Prothrombin Time 13.6 11.6 - 15.2 seconds   INR 1.02 0.00 - 1.49    Imaging: No results found.  Assessment/Plan 1. Hepatocellular carcinoma -plan to proceed with conventional TACE procedure today with plans for microwave ablation in the future.   -patient has been feeling well and her labs and vitals have been reviewed and are normal. -plan for overnight observation after procedure today -Risks and Benefits discussed with the patient including, but not limited to bleeding, infection, vascular injury, contrast induced renal failure, post procedural pain, nausea, vomiting, fatigue, progression of liver failure, chemical cholecystitis, or need for additional procedures. All of the patient's questions were answered, patient is agreeable to proceed. Consent signed and in chart.  Thank you for this interesting consult.  I greatly enjoyed meeting Samantha [name] and look  forward to participating in their care.  A copy of this report was sent to the requesting provider on this date.  Electronically Signed: Henreitta Cea 02/21/2016, 8:44 AM   I spent a total of   30 minutes in face to face  in clinical consultation, greater than 50% of which was counseling/coordinating care for [facility] for conventional TACE today

## 2016-02-21 NOTE — Sedation Documentation (Signed)
Gauze/tegaderm bandage applied to R fem art puncture site. Groin level 0, RDP doppler +.

## 2016-02-22 ENCOUNTER — Telehealth: Payer: Self-pay | Admitting: *Deleted

## 2016-02-22 DIAGNOSIS — C220 Liver cell carcinoma: Secondary | ICD-10-CM | POA: Diagnosis not present

## 2016-02-22 DIAGNOSIS — E785 Hyperlipidemia, unspecified: Secondary | ICD-10-CM | POA: Diagnosis not present

## 2016-02-22 DIAGNOSIS — K219 Gastro-esophageal reflux disease without esophagitis: Secondary | ICD-10-CM | POA: Diagnosis not present

## 2016-02-22 DIAGNOSIS — J302 Other seasonal allergic rhinitis: Secondary | ICD-10-CM | POA: Diagnosis not present

## 2016-02-22 DIAGNOSIS — I10 Essential (primary) hypertension: Secondary | ICD-10-CM | POA: Diagnosis not present

## 2016-02-22 DIAGNOSIS — Z9049 Acquired absence of other specified parts of digestive tract: Secondary | ICD-10-CM | POA: Diagnosis not present

## 2016-02-22 DIAGNOSIS — Z79899 Other long term (current) drug therapy: Secondary | ICD-10-CM | POA: Diagnosis not present

## 2016-02-22 DIAGNOSIS — Z7982 Long term (current) use of aspirin: Secondary | ICD-10-CM | POA: Diagnosis not present

## 2016-02-22 DIAGNOSIS — M170 Bilateral primary osteoarthritis of knee: Secondary | ICD-10-CM | POA: Diagnosis not present

## 2016-02-22 DIAGNOSIS — Z96653 Presence of artificial knee joint, bilateral: Secondary | ICD-10-CM | POA: Diagnosis not present

## 2016-02-22 LAB — COMPREHENSIVE METABOLIC PANEL
ALK PHOS: 88 U/L (ref 38–126)
ALT: 54 U/L (ref 14–54)
ANION GAP: 8 (ref 5–15)
AST: 68 U/L — AB (ref 15–41)
Albumin: 3.4 g/dL — ABNORMAL LOW (ref 3.5–5.0)
BILIRUBIN TOTAL: 0.4 mg/dL (ref 0.3–1.2)
BUN: 14 mg/dL (ref 6–20)
CALCIUM: 8.9 mg/dL (ref 8.9–10.3)
CO2: 23 mmol/L (ref 22–32)
CREATININE: 0.95 mg/dL (ref 0.44–1.00)
Chloride: 111 mmol/L (ref 101–111)
GFR calc Af Amer: >60 mL/min (ref >60)
Glucose, Bld: 129 mg/dL — ABNORMAL HIGH (ref 65–99)
POTASSIUM: 4.4 mmol/L (ref 3.5–5.1)
Sodium: 142 mmol/L (ref 135–145)
Total Protein: 7.2 g/dL (ref 6.5–8.1)

## 2016-02-22 LAB — CBC
HEMATOCRIT: 38.3 % (ref 36.0–46.0)
HEMOGLOBIN: 12.3 g/dL (ref 12.0–15.0)
MCH: 27.5 pg (ref 26.0–34.0)
MCHC: 32.1 g/dL (ref 30.0–36.0)
MCV: 85.5 fL (ref 78.0–100.0)
Platelets: 171 10*3/uL (ref 150–400)
RBC: 4.48 MIL/uL (ref 3.87–5.11)
RDW: 13.4 % (ref 11.5–15.5)
WBC: 6.8 10*3/uL (ref 4.0–10.5)

## 2016-02-22 NOTE — Care Management Note (Signed)
Case Management Note  Patient Details  Name: Samantha [name] MRN: [id number] Date of Birth: 26-Apr-1951  Subjective/Objective:                    Action/Plan:d/c home no needs or orders.   Expected Discharge Date:                  Expected Discharge Plan:  Home/Self Care  In-House Referral:     Discharge planning Services  CM Consult  Post Acute Care Choice:    Choice offered to:     DME Arranged:    DME Agency:     HH Arranged:    [place] Agency:     Status of Service:  Completed, signed off  Medicare Important Message Given:    Date Medicare IM Given:    Medicare IM give by:    Date Additional Medicare IM Given:    Additional Medicare Important Message give by:     If discussed at [place] of Stay Meetings, dates discussed:    Additional Comments:  Dessa Phi, RN 02/22/2016, 10:07 AM

## 2016-02-22 NOTE — Discharge Summary (Signed)
Patient ID: Samantha [name] MRN: [id number] DOB/AGE: [birth year] 65 y.o.  Admit date: 02/21/2016 Discharge date: 02/22/2016  Samantha [name]  Admission Diagnoses: Hepatocellular carcinoma/ NASH cirrhosis  Discharge Diagnoses: Hepatocellular carcinoma/NASH cirrhosis, status post visceral/hepatic arteriogram with successful superselective transarterial chemoembolization of segment 8 hepatocellular carcinoma utilizing  lipiodol and doxorubicin/mitomycin-C. Principal Problem:   Hepatocellular carcinoma [facility])  Past Medical History  Diagnosis Date  . Hepatitis C   . Hypertension   . Elevated glucose   . LBP (low back pain)   . Obesity   . Osteoarthritis     B knees  . Hyperlipidemia 2011  . GERD (gastroesophageal reflux disease)   . Allergy     seasonal   Past Surgical History  Procedure Laterality Date  . Right knee replacement  2007  . Left shoulder arthroplasty    . Total knee replacement l  03-13-08  . Cholecystectomy    . Tubal ligation    . Joint replacement    . Colonoscopy N/A 01/09/2015    Procedure: COLONOSCOPY;  Surgeon: Jerene Bears, MD;  Location: [facility];  Service: Gastroenterology;  Laterality: N/A;     Discharged Condition: good  Hospital Course: Samantha [name] is a 65 year old female with hepatitis C and NASH cirrhosis complicated by formation of a 4.9 cm biopsy-proven hepatocellular carcinoma. She was seen in consultation recently by Dr. Laurence Ferrari and deemed an appropriate candidate for conventional hepatic TACE. On 02/21/16 she underwent multivessel visceral/hepatic arteriography with successful superselective transarterial chemoembolization of segment 8 hepatocellular carcinoma utilizing  lipiodol as well as doxorubicin and mitomycin-C. The procedure was performed under moderate conscious sedation and the patient was admitted for overnight observation. Overnight the patient did well with exception of some intermittent mild nausea and mild right groin  discomfort. On the day of discharge the patient was doing well. She was able to void, ambulate and tolerate her diet without significant difficulty. She continued to have some mild episodes of nausea but no vomiting. Follow-up laboratory studies revealed normal bilirubin but mildly elevated AST, and normal WBC, hemoglobin and platelet count. We will obtain follow-up CMP at primary care physician's office in 1 week. She will be scheduled for thermal/microwave hepatic ablation on 03/21/2016 at [facility]. She was given prescriptions for Cipro 500 mg, #14, no refills, 1 tablet twice daily for 1 week, Zofran 8 mg, #20, no refills, 1 tablet twice daily as needed for nausea, OxyIR, 5 mg, #20, no refills, 1 tablet every 12 hours as needed for moderate to severe pain. The patient was told to contact our service in the interval with any questions or concerns.  Consults: none  Significant Diagnostic Studies: Results for orders placed or performed during the hospital encounter of 02/21/16  CBC with Differential/Platelet  Result Value Ref Range   WBC 4.7 4.0 - 10.5 K/uL   RBC 4.79 3.87 - 5.11 MIL/uL   Hemoglobin 12.9 12.0 - 15.0 g/dL   HCT 41.2 36.0 - 46.0 %   MCV 86.0 78.0 - 100.0 fL   MCH 26.9 26.0 - 34.0 pg   MCHC 31.3 30.0 - 36.0 g/dL   RDW 13.9 11.5 - 15.5 %   Platelets 189 150 - 400 K/uL   Neutrophils Relative % 43 %   Neutro Abs 2.0 1.7 - 7.7 K/uL   Lymphocytes Relative 40 %   Lymphs Abs 1.9 0.7 - 4.0 K/uL   Monocytes Relative 14 %   Monocytes Absolute 0.7 0.1 - 1.0 K/uL   Eosinophils  Relative 3 %   Eosinophils Absolute 0.2 0.0 - 0.7 K/uL   Basophils Relative 0 %   Basophils Absolute 0.0 0.0 - 0.1 K/uL  Comprehensive metabolic panel  Result Value Ref Range   Sodium 141 135 - 145 mmol/L   Potassium 3.9 3.5 - 5.1 mmol/L   Chloride 107 101 - 111 mmol/L   CO2 24 22 - 32 mmol/L   Glucose, Bld 99 65 - 99 mg/dL   BUN 15 6 - 20 mg/dL   Creatinine, Ser 0.91 0.44 - 1.00 mg/dL   Calcium  9.9 8.9 - 10.3 mg/dL   Total Protein 8.0 6.5 - 8.1 g/dL   Albumin 3.7 3.5 - 5.0 g/dL   AST 43 (H) 15 - 41 U/L   ALT 39 14 - 54 U/L   Alkaline Phosphatase 104 38 - 126 U/L   Total Bilirubin 0.7 0.3 - 1.2 mg/dL   GFR calc non Af Amer >60 >60 mL/min   GFR calc Af Amer >60 >60 mL/min   Anion gap 10 5 - 15  Protime-INR  Result Value Ref Range   Prothrombin Time 13.6 11.6 - 15.2 seconds   INR 1.02 0.00 - 1.49  CBC  Result Value Ref Range   WBC 6.8 4.0 - 10.5 K/uL   RBC 4.48 3.87 - 5.11 MIL/uL   Hemoglobin 12.3 12.0 - 15.0 g/dL   HCT 38.3 36.0 - 46.0 %   MCV 85.5 78.0 - 100.0 fL   MCH 27.5 26.0 - 34.0 pg   MCHC 32.1 30.0 - 36.0 g/dL   RDW 13.4 11.5 - 15.5 %   Platelets 171 150 - 400 K/uL  Comprehensive metabolic panel  Result Value Ref Range   Sodium 142 135 - 145 mmol/L   Potassium 4.4 3.5 - 5.1 mmol/L   Chloride 111 101 - 111 mmol/L   CO2 23 22 - 32 mmol/L   Glucose, Bld 129 (H) 65 - 99 mg/dL   BUN 14 6 - 20 mg/dL   Creatinine, Ser 0.95 0.44 - 1.00 mg/dL   Calcium 8.9 8.9 - 10.3 mg/dL   Total Protein 7.2 6.5 - 8.1 g/dL   Albumin 3.4 (L) 3.5 - 5.0 g/dL   AST 68 (H) 15 - 41 U/L   ALT 54 14 - 54 U/L   Alkaline Phosphatase 88 38 - 126 U/L   Total Bilirubin 0.4 0.3 - 1.2 mg/dL   GFR calc non Af Amer >60 >60 mL/min   GFR calc Af Amer >60 >60 mL/min   Anion gap 8 5 - 15      Treatments: Visceral/hepatic arteriogram with successful superselective transarterial chemoembolization of segment 8 hepatocellular carcinoma utilizing lipiodol and doxorubicin/mitomycin-C on 02/21/16  Discharge Exam: Blood pressure 115/65, pulse 85, temperature 97.6 F (36.4 C), temperature source Oral, resp. rate 12, height 5\' 2"  (1.575 m), weight 277 lb (125.646 kg), SpO2 100 %. Patient awake, alert. Chest clear to auscultation bilaterally. Heart with regular rate and rhythm. Abdomen soft, obese, positive bowel sounds, mildly tender puncture site right common femoral artery, site soft to touch with no  distinct hematoma; LE extremities with intact distal pulses and trace edema bilaterally  Disposition: home  Discharge Instructions    Call MD for:  difficulty breathing, headache or visual disturbances    Complete by:  As directed      Call MD for:  extreme fatigue    Complete by:  As directed      Call MD for:  hives  Complete by:  As directed      Call MD for:  persistant dizziness or light-headedness    Complete by:  As directed      Call MD for:  persistant nausea and vomiting    Complete by:  As directed      Call MD for:  redness, tenderness, or signs of infection (pain, swelling, redness, odor or green/yellow discharge around incision site)    Complete by:  As directed      Call MD for:  severe uncontrolled pain    Complete by:  As directed      Call MD for:  temperature >100.4    Complete by:  As directed      Change dressing (specify)    Complete by:  As directed   May change bandage over right groin daily for the next 2-3 days; may wash site with soap and water     Diet - low sodium heart healthy    Complete by:  As directed      Driving Restrictions    Complete by:  As directed   No driving for next 48 hours     Increase activity slowly    Complete by:  As directed      Lifting restrictions    Complete by:  As directed   No heavy lifting for next 3-4 days     May shower / Bathe    Complete by:  As directed      May walk up steps    Complete by:  As directed             Medication List    STOP taking these medications        acetaminophen 500 MG tablet  Commonly known as:  TYLENOL      TAKE these medications        amLODipine 5 MG tablet  Commonly known as:  NORVASC  Take 1 tablet (5 mg total) by mouth daily.     aspirin 81 MG tablet  Take 81 mg by mouth daily.     EQL VITAMIN D3 1000 units tablet  Generic drug:  Cholecalciferol  Take 1,000 Units by mouth daily.     fluticasone 50 MCG/ACT nasal spray  Commonly known as:  FLONASE  Place 2  sprays into the nose daily.     furosemide 20 MG tablet  Commonly known as:  LASIX  Take 1 tablet (20 mg total) by mouth daily.     Ledipasvir-Sofosbuvir 90-400 MG Tabs  Commonly known as:  HARVONI  Take 1 tablet by mouth daily.     loratadine 10 MG tablet  Commonly known as:  CLARITIN  Take 1 tablet (10 mg total) by mouth daily.     losartan 100 MG tablet  Commonly known as:  COZAAR  Take 1 tablet (100 mg total) by mouth daily.     multivitamin with minerals Tabs tablet  Take 1 tablet by mouth daily.     pantoprazole 40 MG tablet  Commonly known as:  PROTONIX  Take 1 tablet (40 mg total) by mouth daily.     potassium chloride SA 20 MEQ tablet  Commonly known as:  K-DUR,KLOR-CON  Take 1 tablet (20 mEq total) by mouth daily.           Follow-up Information    Follow up with Truitt Merle, MD.   Specialties:  Hematology, Oncology   Why:  Follow up with Dr. Burr Medico  as  scheduled   Contact information:   [place] [address] [id number]       Follow up with Walker Kehr, MD.   Specialty:  Internal Medicine   Why:  Follow up with Dr. Alain Marion as scheduled ; please obtain CMP lab study as ordered at his office on 02/28/16   Contact information:   [address] [place] [address] [phone]       Follow up with Samantha Cadet, MD.   Specialties:  Interventional Radiology, Radiology   Why:  You're scheduled for follow-up microwave ablation of liver on 03/21/16; please refer to instruction sheet for details; please call [phone] or [phone] with any questions   Contact information:   [address] [place] [address] [id number]        Electronically Signed: D. Rowe Robert 02/22/2016, 9:53 AM   I have spent less than 30 minutes discharging Samantha [name].

## 2016-02-22 NOTE — Telephone Encounter (Signed)
Pt on tcm list admitted dx hepatocellular Carcinoma/[place] Cirrhosis wll f/u with oncologist, but will be seeing Dr. Alain Marion for her CPX 02/28/16.Marland KitchenJohny Chess

## 2016-02-22 NOTE — Progress Notes (Signed)
Discharge instructions reviewed with patient, questions answered, verbalized understanding.  Patient transported to front of hospital via wheelchair to be taken home by friend.

## 2016-02-22 NOTE — Discharge Instructions (Signed)
Radiofrequency/Microwave Ablation of Liver Tumors °Ablation is a procedure that destroys specific cells in the body. Radiofrequency means that high-energy radio waves are used for this procedure. In radiofrequency ablation of liver tumors, a needle-like probe is placed close to the tumor. The probe uses radio waves to produce heat that kills the cancer cells. Months later, the dead tumor cells turn into harmless scar tissue. This procedure is usually used:  °· For smaller tumors (less than about 1½ in [3.8 cm]).   °· In people whose medical condition makes surgery too dangerous.   °· For tumors that are in risky locations, have not shrunk with chemotherapy, or that have come back after having been removed through surgery.   °· In people who have certain types of liver cancer and are on a waiting list for a liver transplant.   °LET YOUR HEALTH CARE PROVIDER KNOW ABOUT:  °· All allergies you have. °· All medicines that you are taking, including vitamins, herbs, eye drops, creams, and over-the-counter medicines. °· Previous problems you or members of your family have had with the use of anesthetics. °· Any blood disorders you have. °· Previous surgeries you have had. °· Medical conditions you have. °· Possibility of pregnancy, if this applies. °RISKS AND COMPLICATIONS  °Generally, this is a safe procedure. However, as with any procedure, complications can occur. Possible complications include:  °· Infection.   °· Bleeding.   °· Pain.   °· Flu-like symptoms, including fever and achiness.   °· Injury to the surrounding organs, such as a lung or the intestines. °BEFORE THE PROCEDURE  °· You may have blood tests done. These tests can help show how well your kidneys and liver are working. They can also show how well your blood clots.   °· Only take medicines as directed by your health care provider. You may need to stop taking medicines (such as blood thinners, aspirin, or nonsteroidal anti-inflammatory drugs) before the  procedure.   °· Do not eat or drink for at least 6 hours before the procedure, or as directed by your health care provider.   °· Arrange for someone to drive you home after the procedure.   °PROCEDURE  °· You will lie on an exam table and will be connected to monitors that keep track of your heart rate, blood pressure, and breathing throughout the procedure. °You will have an IV tube placed in a vein.   °· You may be given a medicine that makes you go to sleep throughout the procedure (general anesthetic) or a medicine that helps you relax (sedative). You may also be given a medicine to numb the area where the probe will pass through the skin (local anesthetic).   °· Radiofrequency ablation can be done:   °¨ Through a regular surgical cut (incision). This type of ablation is called surgical radiofrequency ablation.   °¨ Through a tiny surgical incision, using a camera-like device to guide the probe to the liver tumor. This type of ablation is called laparoscopic radiofrequency ablation.   °¨ By using needle-sized electrodes that are passed through the skin directly into the area of the liver being treated. This type of ablation is called percutaneous radiofrequency ablation.   °· Ultrasound or CT scans are used to make sure the tip of the probe is in the right location.   °· Once the probe has been situated next to the tumor, radio waves will produce heat that kills the tumor cells. Depending on the size of the tumor, the probe may need to be repositioned several times.   °· Once the tumor has been destroyed,   the probe will be removed and a bandage will be applied.  AFTER THE PROCEDURE  If you received a sedative or a general anesthetic during the procedure, you will be sleepy for the first few hours.   You may have some pain or nausea. This can usually be controlled with medicines.   You will stay in the recovery room until you are awake and able to drink fluids.    This information is not intended to  replace advice given to you by your health care provider. Make sure you discuss any questions you have with your health care provider.   Document Released: 05/03/2009 Document Revised: 12/20/2013 Document Reviewed: 08/22/2013 Elsevier Interactive Patient Education [employer].

## 2016-02-27 ENCOUNTER — Other Ambulatory Visit: Payer: Self-pay | Admitting: Internal Medicine

## 2016-02-28 ENCOUNTER — Other Ambulatory Visit: Payer: Medicare Other

## 2016-02-28 DIAGNOSIS — C220 Liver cell carcinoma: Secondary | ICD-10-CM | POA: Diagnosis not present

## 2016-03-03 ENCOUNTER — Encounter: Payer: Self-pay | Admitting: Internal Medicine

## 2016-03-03 ENCOUNTER — Ambulatory Visit (INDEPENDENT_AMBULATORY_CARE_PROVIDER_SITE_OTHER): Payer: Medicare Other | Admitting: Internal Medicine

## 2016-03-03 VITALS — BP 104/65 | HR 94 | Temp 98.4°F | Ht 63.0 in | Wt 281.0 lb

## 2016-03-03 DIAGNOSIS — K7460 Unspecified cirrhosis of liver: Secondary | ICD-10-CM | POA: Insufficient documentation

## 2016-03-03 DIAGNOSIS — C220 Liver cell carcinoma: Secondary | ICD-10-CM | POA: Diagnosis not present

## 2016-03-03 DIAGNOSIS — B182 Chronic viral hepatitis C: Secondary | ICD-10-CM | POA: Diagnosis not present

## 2016-03-03 DIAGNOSIS — K7581 Nonalcoholic steatohepatitis (NASH): Secondary | ICD-10-CM | POA: Insufficient documentation

## 2016-03-03 NOTE — Progress Notes (Signed)
   Subjective:    Patient ID: Samantha [name], female    DOB: 09-29-1951, 65 y.o.   MRN: [id number]  HPI Here for follow up of her HCV and [place].    Initially seen 12/22 to discuss her MRI results and concern for cancer.  AFP also significantly elevated.  Did get in to see Dr Burr Medico and very pleased with her care.  Is hopeful for a surgical approach for the [facility].  She did get in to hepatology who recommended TACE and ablation and is scheduled with Dr. Laurence Ferrari for that later this month.    She has genotype 1b, viral load of 1 million.  Hepatitis A and B immune, persistent transaminitis up to 400s.  IShe also started Harvoni and now completed therapy.  I checked a viral load near the end of treatmen to assure it was not still active and was undetectable.   No issues with the medication.  Tells me she has good social support and is here with her grandchild and neighbor friend.     Review of Systems  Constitutional: Negative for fatigue and unexpected weight change.  Gastrointestinal: Negative for nausea and diarrhea.  Skin: Negative for rash.  Neurological: Negative for dizziness and light-headedness.       Objective:   Physical Exam  Constitutional: She appears well-developed and well-nourished. No distress.  Eyes: No scleral icterus.  Cardiovascular: Normal rate, regular rhythm and normal heart sounds.   No murmur heard. Pulmonary/Chest: Effort normal and breath sounds normal. No respiratory distress.  Skin: No rash noted.   Social History   Social History  . Marital Status: Single    Spouse Name: N/A  . Number of Children: N/A  . Years of Education: N/A   Occupational History  . Not on file.   Social History Main Topics  . Smoking status: Former Smoker -- 0.25 packs/day for 20 years    Quit date: 12/30/2007  . Smokeless tobacco: Never Used  . 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 Drug Use: No  .  Sexual Activity: Not Currently   Other Topics Concern  . Not on file   Social History Narrative   Regular exercise: walk a few times a week   Caffeine use: none   Single, raising her young great granddaughter and watches grandchildren after school   Has a room mate to help prn   Retired from nursing tech--worked at [employer]:

## 2016-03-03 NOTE — Assessment & Plan Note (Signed)
I will re refer to Dr. Hilarie Fredrickson.  Does not look like she got in yet.  Needs EGD.

## 2016-03-03 NOTE — Assessment & Plan Note (Signed)
Near end of treatment viral load undetectable.  Will recheck in 4 months.

## 2016-03-03 NOTE — Assessment & Plan Note (Signed)
Getting TACE and ablation later this month.

## 2016-03-04 MED FILL — Medication: Qty: 1 | Status: AC

## 2016-03-06 ENCOUNTER — Inpatient Hospital Stay (HOSPITAL_COMMUNITY): Admission: RE | Admit: 2016-03-06 | Payer: Medicare Other | Source: Ambulatory Visit | Admitting: General Surgery

## 2016-03-06 ENCOUNTER — Encounter (HOSPITAL_COMMUNITY): Admission: RE | Payer: Self-pay | Source: Ambulatory Visit

## 2016-03-06 SURGERY — LAPAROSCOPY, DIAGNOSTIC
Anesthesia: General

## 2016-03-18 ENCOUNTER — Encounter (HOSPITAL_COMMUNITY): Payer: Self-pay

## 2016-03-18 ENCOUNTER — Other Ambulatory Visit: Payer: Self-pay

## 2016-03-18 ENCOUNTER — Encounter (HOSPITAL_COMMUNITY)
Admission: RE | Admit: 2016-03-18 | Discharge: 2016-03-18 | Disposition: A | Discharge status: Home / Self Care | Payer: Medicare Other | Source: Ambulatory Visit | Attending: Urology | Admitting: Urology

## 2016-03-18 DIAGNOSIS — R110 Nausea: Secondary | ICD-10-CM | POA: Diagnosis not present

## 2016-03-18 DIAGNOSIS — E785 Hyperlipidemia, unspecified: Secondary | ICD-10-CM | POA: Diagnosis not present

## 2016-03-18 DIAGNOSIS — C220 Liver cell carcinoma: Secondary | ICD-10-CM | POA: Diagnosis not present

## 2016-03-18 DIAGNOSIS — Z7982 Long term (current) use of aspirin: Secondary | ICD-10-CM | POA: Diagnosis not present

## 2016-03-18 DIAGNOSIS — Z96651 Presence of right artificial knee joint: Secondary | ICD-10-CM | POA: Diagnosis not present

## 2016-03-18 DIAGNOSIS — B1920 Unspecified viral hepatitis C without hepatic coma: Secondary | ICD-10-CM | POA: Diagnosis not present

## 2016-03-18 DIAGNOSIS — Z79899 Other long term (current) drug therapy: Secondary | ICD-10-CM | POA: Diagnosis not present

## 2016-03-18 DIAGNOSIS — Z9049 Acquired absence of other specified parts of digestive tract: Secondary | ICD-10-CM | POA: Diagnosis not present

## 2016-03-18 DIAGNOSIS — K7460 Unspecified cirrhosis of liver: Secondary | ICD-10-CM | POA: Diagnosis not present

## 2016-03-18 DIAGNOSIS — I10 Essential (primary) hypertension: Secondary | ICD-10-CM | POA: Diagnosis not present

## 2016-03-18 DIAGNOSIS — Z87891 Personal history of nicotine dependence: Secondary | ICD-10-CM | POA: Diagnosis not present

## 2016-03-18 DIAGNOSIS — K219 Gastro-esophageal reflux disease without esophagitis: Secondary | ICD-10-CM | POA: Diagnosis not present

## 2016-03-18 DIAGNOSIS — Z6841 Body Mass Index (BMI) 40.0 and over, adult: Secondary | ICD-10-CM | POA: Diagnosis not present

## 2016-03-18 DIAGNOSIS — K7581 Nonalcoholic steatohepatitis (NASH): Secondary | ICD-10-CM | POA: Diagnosis not present

## 2016-03-18 HISTORY — DX: Allergy status to unspecified drugs, medicaments and biological substances: Z88.9

## 2016-03-18 HISTORY — DX: Malignant (primary) neoplasm, unspecified: C80.1

## 2016-03-18 LAB — COMPREHENSIVE METABOLIC PANEL
ALBUMIN: 3.6 g/dL (ref 3.5–5.0)
ALK PHOS: 105 U/L (ref 38–126)
ALT: 33 U/L (ref 14–54)
AST: 32 U/L (ref 15–41)
Anion gap: 8 (ref 5–15)
BILIRUBIN TOTAL: 0.4 mg/dL (ref 0.3–1.2)
BUN: 16 mg/dL (ref 6–20)
CALCIUM: 9.4 mg/dL (ref 8.9–10.3)
CO2: 26 mmol/L (ref 22–32)
Chloride: 109 mmol/L (ref 101–111)
Creatinine, Ser: 0.8 mg/dL (ref 0.44–1.00)
GFR calc Af Amer: >60 mL/min (ref >60)
GFR calc non Af Amer: >60 mL/min (ref >60)
GLUCOSE: 95 mg/dL (ref 65–99)
Potassium: 4.4 mmol/L (ref 3.5–5.1)
SODIUM: 143 mmol/L (ref 135–145)
Total Protein: 7.4 g/dL (ref 6.5–8.1)

## 2016-03-18 LAB — CBC WITH DIFFERENTIAL/PLATELET
BASOS PCT: 0 %
Basophils Absolute: 0 10*3/uL (ref 0.0–0.1)
Eosinophils Absolute: 0.1 10*3/uL (ref 0.0–0.7)
Eosinophils Relative: 3 %
HEMATOCRIT: 39.7 % (ref 36.0–46.0)
HEMOGLOBIN: 12.1 g/dL (ref 12.0–15.0)
LYMPHS PCT: 31 %
Lymphs Abs: 1.4 10*3/uL (ref 0.7–4.0)
MCH: 26.5 pg (ref 26.0–34.0)
MCHC: 30.5 g/dL (ref 30.0–36.0)
MCV: 87.1 fL (ref 78.0–100.0)
MONO ABS: 0.5 10*3/uL (ref 0.1–1.0)
MONOS PCT: 12 %
NEUTROS ABS: 2.4 10*3/uL (ref 1.7–7.7)
NEUTROS PCT: 54 %
Platelets: 215 10*3/uL (ref 150–400)
RBC: 4.56 MIL/uL (ref 3.87–5.11)
RDW: 13.7 % (ref 11.5–15.5)
WBC: 4.5 10*3/uL (ref 4.0–10.5)

## 2016-03-18 LAB — PROTIME-INR
INR: 1.05 (ref 0.00–1.49)
Prothrombin Time: 13.9 seconds (ref 11.6–15.2)

## 2016-03-18 LAB — APTT: aPTT: 31 seconds (ref 24–37)

## 2016-03-18 LAB — ABO/RH: ABO/RH(D): O POS

## 2016-03-18 NOTE — Pre-Procedure Instructions (Signed)
CT Chest 1'17 Epic. Labs 02-22-16 CBC, CMP - Epic.

## 2016-03-18 NOTE — Patient Instructions (Signed)
Samantha [name]  03/18/2016   Your procedure is scheduled on:  03-21-16  Report to [facility] Main  Entrance take [facility]  elevators to 3rd floor to  [place] at  [place] AM.  Call this number if you have problems the morning of surgery [phone]   Remember: ONLY 1 PERSON MAY GO WITH YOU TO SHORT STAY TO GET  READY MORNING OF [name].  Do not eat food or drink liquids :After Midnight.     Take these medicines the morning of surgery with A SIP OF WATER:  Amlodipine. Loratadine(if need). DO NOT TAKE ANY DIABETIC MEDICATIONS DAY OF YOUR SURGERY                               You may not have any metal on your body including hair pins and              piercings  Do not wear jewelry, make-up, lotions, powders or perfumes, deodorant             Do not wear nail polish.  Do not shave  48 hours prior to surgery.              Men may shave face and neck.   Do not bring valuables to the hospital. [place].  Contacts, dentures or bridgework may not be worn into surgery.  Leave suitcase in the car. After surgery it may be brought to your room.     Patients discharged the day of surgery will not be allowed to drive home.  Name and phone number of your driver: Samantha [name],[phone] cell  Special Instructions: N/A              Please read over the following fact sheets you were given: _____________________________________________________________________             [facility] - Preparing for Surgery Before surgery, you can play an important role.  Because skin is not sterile, your skin needs to be as free of germs as possible.  You can reduce the number of germs on your skin by washing with CHG (chlorahexidine gluconate) soap before surgery.  CHG is an antiseptic cleaner which kills germs and bonds with the skin to continue killing germs even after washing. Please DO NOT use if you have an allergy  to CHG or antibacterial soaps.  If your skin becomes reddened/irritated stop using the CHG and inform your nurse when you arrive at Short Stay. Do not shave (including legs and underarms) for at least 48 hours prior to the first CHG shower.  You may shave your face/neck. Please follow these instructions carefully:  1.  Shower with CHG Soap the night before surgery and the  morning of Surgery.  2.  If you choose to wash your hair, wash your hair first as usual with your  normal  shampoo.  3.  After you shampoo, rinse your hair and body thoroughly to remove the  shampoo.                           4.  Use CHG as you would any other liquid soap.  You can apply chg  directly  to the skin and wash                       Gently with a scrungie or clean washcloth.  5.  Apply the CHG Soap to your body ONLY FROM THE NECK DOWN.   Do not use on face/ open                           Wound or open sores. Avoid contact with eyes, ears mouth and genitals (private parts).                       Wash face,  Genitals (private parts) with your normal soap.             6.  Wash thoroughly, paying special attention to the area where your surgery  will be performed.  7.  Thoroughly rinse your body with warm water from the neck down.  8.  DO NOT shower/wash with your normal soap after using and rinsing off  the CHG Soap.                9.  Pat yourself dry with a clean towel.            10.  Wear clean pajamas.            11.  Place clean sheets on your bed the night of your first shower and do not  sleep with pets. Day of Surgery : Do not apply any lotions/deodorants the morning of surgery.  Please wear clean clothes to the hospital/surgery center.  FAILURE TO FOLLOW THESE INSTRUCTIONS MAY RESULT IN THE CANCELLATION OF YOUR SURGERY PATIENT SIGNATURE_________________________________  NURSE SIGNATURE__________________________________  ________________________________________________________________________

## 2016-03-19 ENCOUNTER — Other Ambulatory Visit: Payer: Self-pay | Admitting: Radiology

## 2016-03-20 ENCOUNTER — Other Ambulatory Visit: Payer: Self-pay | Admitting: Physician Assistant

## 2016-03-21 ENCOUNTER — Ambulatory Visit (HOSPITAL_COMMUNITY): Payer: Medicare Other | Admitting: Anesthesiology

## 2016-03-21 ENCOUNTER — Encounter (HOSPITAL_COMMUNITY): Payer: Self-pay | Admitting: General Practice

## 2016-03-21 ENCOUNTER — Observation Stay (HOSPITAL_COMMUNITY)
Admission: RE | Admit: 2016-03-21 | Discharge: 2016-03-22 | Disposition: A | Discharge status: Home / Self Care | Payer: Medicare Other | Source: Ambulatory Visit | Attending: Interventional Radiology | Admitting: Interventional Radiology

## 2016-03-21 ENCOUNTER — Ambulatory Visit (HOSPITAL_COMMUNITY)
Admit: 2016-03-21 | Discharge: 2016-03-21 | Disposition: A | Discharge status: Home / Self Care | Payer: Medicare Other | Attending: Interventional Radiology | Admitting: Interventional Radiology

## 2016-03-21 ENCOUNTER — Encounter (HOSPITAL_COMMUNITY)
Admission: RE | Disposition: A | Discharge status: Home / Self Care | Payer: Self-pay | Source: Ambulatory Visit | Attending: Interventional Radiology

## 2016-03-21 ENCOUNTER — Ambulatory Visit (HOSPITAL_COMMUNITY): Payer: Medicare Other

## 2016-03-21 DIAGNOSIS — Z79899 Other long term (current) drug therapy: Secondary | ICD-10-CM | POA: Insufficient documentation

## 2016-03-21 DIAGNOSIS — R110 Nausea: Secondary | ICD-10-CM | POA: Insufficient documentation

## 2016-03-21 DIAGNOSIS — K7581 Nonalcoholic steatohepatitis (NASH): Secondary | ICD-10-CM | POA: Diagnosis not present

## 2016-03-21 DIAGNOSIS — Z96651 Presence of right artificial knee joint: Secondary | ICD-10-CM | POA: Diagnosis not present

## 2016-03-21 DIAGNOSIS — C220 Liver cell carcinoma: Secondary | ICD-10-CM | POA: Diagnosis not present

## 2016-03-21 DIAGNOSIS — Z87891 Personal history of nicotine dependence: Secondary | ICD-10-CM | POA: Diagnosis not present

## 2016-03-21 DIAGNOSIS — Z01818 Encounter for other preprocedural examination: Secondary | ICD-10-CM

## 2016-03-21 DIAGNOSIS — Z9049 Acquired absence of other specified parts of digestive tract: Secondary | ICD-10-CM | POA: Insufficient documentation

## 2016-03-21 DIAGNOSIS — B1920 Unspecified viral hepatitis C without hepatic coma: Secondary | ICD-10-CM | POA: Insufficient documentation

## 2016-03-21 DIAGNOSIS — Z6841 Body Mass Index (BMI) 40.0 and over, adult: Secondary | ICD-10-CM | POA: Insufficient documentation

## 2016-03-21 DIAGNOSIS — Z7982 Long term (current) use of aspirin: Secondary | ICD-10-CM | POA: Insufficient documentation

## 2016-03-21 DIAGNOSIS — E785 Hyperlipidemia, unspecified: Secondary | ICD-10-CM | POA: Insufficient documentation

## 2016-03-21 DIAGNOSIS — K7460 Unspecified cirrhosis of liver: Secondary | ICD-10-CM | POA: Insufficient documentation

## 2016-03-21 DIAGNOSIS — K219 Gastro-esophageal reflux disease without esophagitis: Secondary | ICD-10-CM | POA: Insufficient documentation

## 2016-03-21 DIAGNOSIS — I10 Essential (primary) hypertension: Secondary | ICD-10-CM | POA: Diagnosis not present

## 2016-03-21 LAB — COMPREHENSIVE METABOLIC PANEL
ALT: 54 U/L (ref 14–54)
AST: 71 U/L — AB (ref 15–41)
Albumin: 3.4 g/dL — ABNORMAL LOW (ref 3.5–5.0)
Alkaline Phosphatase: 100 U/L (ref 38–126)
Anion gap: 9 (ref 5–15)
BUN: 16 mg/dL (ref 6–20)
CHLORIDE: 108 mmol/L (ref 101–111)
CO2: 24 mmol/L (ref 22–32)
CREATININE: 0.78 mg/dL (ref 0.44–1.00)
Calcium: 8.7 mg/dL — ABNORMAL LOW (ref 8.9–10.3)
GFR calc Af Amer: >60 mL/min (ref >60)
GFR calc non Af Amer: >60 mL/min (ref >60)
Glucose, Bld: 128 mg/dL — ABNORMAL HIGH (ref 65–99)
Potassium: 3.6 mmol/L (ref 3.5–5.1)
SODIUM: 141 mmol/L (ref 135–145)
Total Bilirubin: 1.3 mg/dL — ABNORMAL HIGH (ref 0.3–1.2)
Total Protein: 7.1 g/dL (ref 6.5–8.1)

## 2016-03-21 LAB — CBC
HEMATOCRIT: 36.7 % (ref 36.0–46.0)
HEMOGLOBIN: 11.6 g/dL — AB (ref 12.0–15.0)
MCH: 26.8 pg (ref 26.0–34.0)
MCHC: 31.6 g/dL (ref 30.0–36.0)
MCV: 84.8 fL (ref 78.0–100.0)
Platelets: 183 10*3/uL (ref 150–400)
RBC: 4.33 MIL/uL (ref 3.87–5.11)
RDW: 13.8 % (ref 11.5–15.5)
WBC: 7.2 10*3/uL (ref 4.0–10.5)

## 2016-03-21 LAB — APTT: APTT: 29 s (ref 24–37)

## 2016-03-21 LAB — TYPE AND SCREEN
ABO/RH(D): O POS
ANTIBODY SCREEN: NEGATIVE

## 2016-03-21 LAB — PROTIME-INR
INR: 1.15 (ref 0.00–1.49)
Prothrombin Time: 14.4 seconds (ref 11.6–15.2)

## 2016-03-21 SURGERY — RADIO FREQUENCY ABLATION
Anesthesia: General | Site: Liver

## 2016-03-21 MED ORDER — MIDAZOLAM HCL 5 MG/5ML IJ SOLN
INTRAMUSCULAR | Status: DC | PRN
  Administered 2016-03-21 (×2): 1 mg via INTRAVENOUS

## 2016-03-21 MED ORDER — LACTATED RINGERS IV SOLN
INTRAVENOUS | Status: DC
  Administered 2016-03-21 (×3): via INTRAVENOUS

## 2016-03-21 MED ORDER — CEFAZOLIN SODIUM-DEXTROSE 2-4 GM/100ML-% IV SOLN
2.0000 g | Freq: Once | INTRAVENOUS | Status: DC
  Filled 2016-03-21: qty 100

## 2016-03-21 MED ORDER — DOCUSATE SODIUM 100 MG PO CAPS
100.0000 mg | ORAL_CAPSULE | Freq: Two times a day (BID) | ORAL | Status: DC
  Administered 2016-03-21 – 2016-03-22 (×2): 100 mg via ORAL
  Filled 2016-03-21 (×2): qty 1

## 2016-03-21 MED ORDER — PHENYLEPHRINE HCL 10 MG/ML IJ SOLN
INTRAMUSCULAR | Status: DC | PRN
  Administered 2016-03-21 (×2): 80 ug via INTRAVENOUS

## 2016-03-21 MED ORDER — FENTANYL CITRATE (PF) 100 MCG/2ML IJ SOLN
INTRAMUSCULAR | Status: DC | PRN
  Administered 2016-03-21: 25 ug via INTRAVENOUS
  Administered 2016-03-21 (×3): 50 ug via INTRAVENOUS
  Administered 2016-03-21: 25 ug via INTRAVENOUS
  Administered 2016-03-21: 50 ug via INTRAVENOUS

## 2016-03-21 MED ORDER — ONDANSETRON HCL 4 MG/2ML IJ SOLN
INTRAMUSCULAR | Status: AC
  Filled 2016-03-21: qty 2

## 2016-03-21 MED ORDER — HYDROMORPHONE HCL 1 MG/ML IJ SOLN
1.0000 mg | INTRAMUSCULAR | Status: DC | PRN
  Administered 2016-03-21 (×2): 1 mg via INTRAVENOUS
  Filled 2016-03-21 (×2): qty 1

## 2016-03-21 MED ORDER — LACTATED RINGERS IV SOLN: INTRAVENOUS | Status: DC

## 2016-03-21 MED ORDER — ROCURONIUM BROMIDE 100 MG/10ML IV SOLN
INTRAVENOUS | Status: DC | PRN
  Administered 2016-03-21: 10 mg via INTRAVENOUS
  Administered 2016-03-21: 50 mg via INTRAVENOUS
  Administered 2016-03-21: 10 mg via INTRAVENOUS

## 2016-03-21 MED ORDER — FENTANYL CITRATE (PF) 100 MCG/2ML IJ SOLN
INTRAMUSCULAR | Status: AC
  Filled 2016-03-21: qty 2

## 2016-03-21 MED ORDER — PIPERACILLIN-TAZOBACTAM 3.375 G IVPB
3.3750 g | Freq: Once | INTRAVENOUS | Status: AC
  Administered 2016-03-21: 3.375 mg via INTRAVENOUS
  Filled 2016-03-21: qty 50

## 2016-03-21 MED ORDER — FENTANYL CITRATE (PF) 250 MCG/5ML IJ SOLN
INTRAMUSCULAR | Status: AC
  Filled 2016-03-21: qty 5

## 2016-03-21 MED ORDER — PROMETHAZINE HCL 25 MG/ML IJ SOLN: 6.2500 mg | Freq: Four times a day (QID) | INTRAMUSCULAR | Status: DC | PRN

## 2016-03-21 MED ORDER — LIDOCAINE HCL (CARDIAC) 20 MG/ML IV SOLN
INTRAVENOUS | Status: DC | PRN
  Administered 2016-03-21: 40 mg via INTRAVENOUS

## 2016-03-21 MED ORDER — ONDANSETRON HCL 4 MG/2ML IJ SOLN
4.0000 mg | Freq: Once | INTRAMUSCULAR | Status: AC | PRN
  Administered 2016-03-21: 4 mg via INTRAVENOUS

## 2016-03-21 MED ORDER — PIPERACILLIN-TAZOBACTAM 3.375 G IVPB
3.3750 g | Freq: Three times a day (TID) | INTRAVENOUS | Status: DC
  Administered 2016-03-21 – 2016-03-22 (×2): 3.375 g via INTRAVENOUS
  Filled 2016-03-21 (×3): qty 50

## 2016-03-21 MED ORDER — KETOROLAC TROMETHAMINE 30 MG/ML IJ SOLN
30.0000 mg | Freq: Four times a day (QID) | INTRAMUSCULAR | Status: DC
  Administered 2016-03-21 – 2016-03-22 (×2): 30 mg via INTRAVENOUS
  Filled 2016-03-21 (×2): qty 1

## 2016-03-21 MED ORDER — ONDANSETRON HCL 4 MG/2ML IJ SOLN: 4.0000 mg | Freq: Four times a day (QID) | INTRAMUSCULAR | Status: DC | PRN

## 2016-03-21 MED ORDER — ONDANSETRON HCL 4 MG/2ML IJ SOLN
INTRAMUSCULAR | Status: DC | PRN
  Administered 2016-03-21: 4 mg via INTRAVENOUS

## 2016-03-21 MED ORDER — IOHEXOL 350 MG/ML SOLN
100.0000 mL | Freq: Once | INTRAVENOUS | Status: AC | PRN
  Administered 2016-03-21: 100 mL via INTRAVENOUS

## 2016-03-21 MED ORDER — SENNOSIDES-DOCUSATE SODIUM 8.6-50 MG PO TABS: 1.0000 | ORAL_TABLET | Freq: Every day | ORAL | Status: DC | PRN

## 2016-03-21 MED ORDER — EPHEDRINE SULFATE 50 MG/ML IJ SOLN
INTRAMUSCULAR | Status: DC | PRN
  Administered 2016-03-21: 20 mg via INTRAVENOUS
  Administered 2016-03-21: 5 mg via INTRAVENOUS

## 2016-03-21 MED ORDER — DIPHENHYDRAMINE HCL 50 MG/ML IJ SOLN: 25.0000 mg | Freq: Four times a day (QID) | INTRAMUSCULAR | Status: DC | PRN

## 2016-03-21 MED ORDER — SUGAMMADEX SODIUM 500 MG/5ML IV SOLN
INTRAVENOUS | Status: DC | PRN
  Administered 2016-03-21: 257.6 mg via INTRAVENOUS

## 2016-03-21 MED ORDER — FENTANYL CITRATE (PF) 100 MCG/2ML IJ SOLN
25.0000 ug | INTRAMUSCULAR | Status: DC | PRN
  Administered 2016-03-21 (×3): 25 ug via INTRAVENOUS

## 2016-03-21 MED ORDER — PROPOFOL 10 MG/ML IV BOLUS
INTRAVENOUS | Status: AC
  Filled 2016-03-21: qty 20

## 2016-03-21 MED ORDER — PROMETHAZINE HCL 25 MG/ML IJ SOLN
6.2500 mg | Freq: Four times a day (QID) | INTRAMUSCULAR | Status: DC | PRN
  Administered 2016-03-21: 12.5 mg via INTRAVENOUS
  Filled 2016-03-21: qty 1

## 2016-03-21 MED ORDER — HYDROCODONE-ACETAMINOPHEN 5-325 MG PO TABS
1.0000 | ORAL_TABLET | ORAL | Status: DC | PRN
  Administered 2016-03-22: 2 via ORAL
  Filled 2016-03-21: qty 2

## 2016-03-21 MED ORDER — PROPOFOL 10 MG/ML IV BOLUS
INTRAVENOUS | Status: DC | PRN
  Administered 2016-03-21: 150 mg via INTRAVENOUS
  Administered 2016-03-21: 50 mg via INTRAVENOUS

## 2016-03-21 MED ORDER — MIDAZOLAM HCL 2 MG/2ML IJ SOLN
INTRAMUSCULAR | Status: AC
  Filled 2016-03-21: qty 2

## 2016-03-21 NOTE — Progress Notes (Signed)
Patient ID: Samantha [name], female   DOB: February 13, 1951, 65 y.o.   MRN: [id number] Pt having quite a bit of pain and nausea after her procedure this morning.  Has drank a small amount of water, but not ate due to nausea.  Her sites are clean and dry.  Will add phenergan for nausea as the zofran is not sufficient at this time.  Cont dilaudid prn and vicodin when able to transition.  Hopefully, still plan for home in am if her symptoms are controlled.  Neena Beecham E 3:38 PM 03/21/2016

## 2016-03-21 NOTE — H&P (Signed)
Referring Physician(s): [id number]  Supervising Physician: [name]  Chief Complaint: Hepatocellular carcinoma, NASH cirrhosis   Subjective: Patient familiar to IR service from recent visceral/hepatic arteriogram with successful superselective transarterial chemoembolization of the segment 8 hepatocellular carcinoma utilizing lipiodol and  doxorubicin/mitomycin-C on 02/21/16. She presents again today for staged CT-guided thermal ablation of the [facility]. Patient is currently doing well with no significant complaints at this time. Additional past medical history as listed below. Past Medical History  Diagnosis Date  . Hepatitis C   . Hypertension   . Elevated glucose     no longer an issue 03-18-16  . LBP (low back pain)   . Obesity   . Osteoarthritis     B knees  . Hyperlipidemia 2011  . GERD (gastroesophageal reflux disease)   . Allergy     seasonal  . Cancer ([place])     liver cancer  . H/O seasonal allergies    Past Surgical History  Procedure Laterality Date  . Right knee replacement  2007  . Left shoulder arthroplasty    . Total knee replacement l  03-13-08  . Cholecystectomy    . Tubal ligation    . Joint replacement    . Colonoscopy N/A 01/09/2015    Procedure: COLONOSCOPY;  Surgeon: Jerene Bears, MD;  Location: [facility] ENDOSCOPY;  Service: Gastroenterology;  Laterality: N/A;     Allergies: Benazepril hcl and Codeine  Medications: Prior to Admission medications   Medication Sig Start Date End Date Taking? Authorizing Provider  amLODipine (NORVASC) 5 MG tablet Take 1 tablet (5 mg total) by mouth daily. 08/14/15  Yes Aleksei Plotnikov V, MD  aspirin 81 MG tablet Take 81 mg by mouth daily.     Yes Historical Provider, MD  Cholecalciferol (EQL VITAMIN D3) 1000 UNITS tablet Take 1,000 Units by mouth daily.     Yes Historical Provider, MD  furosemide (LASIX) 20 MG tablet Take 1 tablet (20 mg total) by mouth daily. 08/14/15  Yes Aleksei Plotnikov V, MD    Ledipasvir-Sofosbuvir (HARVONI) 90-400 MG TABS Take 1 tablet by mouth daily. 12/05/15  Yes Thayer Headings, MD  loratadine (CLARITIN) 10 MG tablet Take 1 tablet (10 mg total) by mouth daily. 08/14/15  Yes Aleksei Plotnikov V, MD  losartan (COZAAR) 100 MG tablet Take 1 tablet (100 mg total) by mouth daily. 08/14/15  Yes Aleksei Plotnikov V, MD  Multiple Vitamin (MULTIVITAMIN WITH MINERALS) TABS tablet Take 1 tablet by mouth daily.   Yes Historical Provider, MD  potassium chloride SA (K-DUR,KLOR-CON) 20 MEQ tablet Take 1 tablet (20 mEq total) by mouth daily. 08/14/15  Yes Aleksei Plotnikov V, MD  fluticasone (FLONASE) 50 MCG/ACT nasal spray Place 2 sprays into the nose daily. Patient taking differently: Place 2 sprays into the nose daily as needed for allergies.  03/23/12   Cassandria Anger, MD  ondansetron (ZOFRAN) 8 MG tablet  02/22/16   Historical Provider, MD  oxyCODONE (OXY IR/ROXICODONE) 5 MG immediate release tablet  02/22/16   Historical Provider, MD  pantoprazole (PROTONIX) 40 MG tablet Take 1 tablet (40 mg total) by mouth daily. 08/14/15   Aleksei Plotnikov V, MD     Vital Signs: BP 127/90 mmHg  Pulse 81  Temp(Src) 98.1 F (36.7 C) (Tympanic)  Resp 18  Ht 5\' 3"  (1.6 m)  Wt 284 lb (128.822 kg)  BMI 50.32 kg/m2  SpO2 100%  Physical Exam patient awake, alert. Chest clear to auscultation bilaterally. Heart with regular rate and  rhythm. Abdomen soft, obese, positive bowel sounds, nontender. Lower extremities with full range of motion and trace edema bilaterally.  Imaging: Dg Chest 1 View  03/21/2016  CLINICAL DATA:  Preoperative respiratory evaluation prior to hepatic chemoembolization procedure. EXAM: CHEST 1 VIEW COMPARISON:  CT chest 01/17/2016. Chest x-rays 03/29/2007, 02/19/2006. FINDINGS: Cardiac silhouette normal in size. Thoracic aorta minimally atherosclerotic, unchanged. Hilar and mediastinal contours otherwise unremarkable. Opacity centrally in the right lower lobe as noted on  the prior CT, likely scar/fibrosis, unchanged. Lungs otherwise clear. No localized airspace consolidation. No pleural effusions. No pneumothorax. Normal pulmonary vascularity. Left shoulder arthroplasty with anatomic alignment. Severe degenerative changes in the right shoulder. IMPRESSION: No acute cardiopulmonary disease. Stable likely scar/interstitial fibrosis medially in the right lower lobe compared to the prior CT chest. Electronically Signed   By: Evangeline Dakin M.D.   On: 03/21/2016 07:17    Labs:  CBC:  Recent Labs  02/01/16 1109 02/21/16 0800 02/22/16 0351 03/18/16 1200  WBC 5.4 4.7 6.8 4.5  HGB 13.8 12.9 12.3 12.1  HCT 42.9 41.2 38.3 39.7  PLT 190 189 171 215    COAGS:  Recent Labs  11/28/15 1028 02/01/16 1109 02/21/16 0800 03/18/16 1200  INR 1.02 1.05 1.02 1.05  APTT  --  29  --  31    BMP:  Recent Labs  01/02/16 0945 02/21/16 0800 02/22/16 0351 03/18/16 1200  NA 141 141 142 143  K 4.0 3.9 4.4 4.4  CL 109 107 111 109  CO2 22 24 23 26   GLUCOSE 92 99 129* 95  BUN 12 15 14 16   CALCIUM 9.3 9.9 8.9 9.4  CREATININE 0.79 0.91 0.95 0.80  GFRNONAA 78 >60 >60 >60  GFRAA >89 >60 >60 >60    LIVER FUNCTION TESTS:  Recent Labs  01/02/16 0945 02/21/16 0800 02/22/16 0351 03/18/16 1200  BILITOT 0.5 0.7 0.4 0.4  AST 33 43* 68* 32  ALT 29 39 54 33  ALKPHOS 106 104 88 105  PROT 7.4 8.0 7.2 7.4  ALBUMIN 3.7 3.7 3.4* 3.6    Assessment and Plan: Patient with history of HCC, NASH cirrhosis, hepatitis C ; status post visceral/hepatic arteriogram with successful superselective transarterial chemoembolization of segment 8 HCC utilizing lipiodol doxorubicin/mitomycin-C on 02/21/16. Plan today is for staged CT-guided thermal ablation of the [place]. Details/risks of procedure, including but not limited to, internal bleeding, infection, injury to adjacent organs, anesthesia-related complications discussed with patient with her understanding and consent. Postprocedure  the patient will be admitted to the hospital for overnight observation.   Electronically Signed: D. Rowe Robert 03/21/2016, 8:11 AM   I spent a total of 30 minutes at the the patient's bedside AND on the patient's hospital floor or unit, greater than 50% of which was counseling/coordinating care for CT-guided thermal ablation of hepatocellular carcinoma

## 2016-03-21 NOTE — Anesthesia Procedure Notes (Signed)
Procedure Name: Intubation Date/Time: 03/21/2016 8:45 AM Performed by: Noralyn Pick D Pre-anesthesia Checklist: Patient identified, Emergency Drugs available, Suction available and Patient being monitored Patient Re-evaluated:Patient Re-evaluated prior to inductionOxygen Delivery Method: Circle System Utilized Preoxygenation: Pre-oxygenation with 100% oxygen Intubation Type: IV induction Ventilation: Mask ventilation without difficulty Laryngoscope Size: Miller and 2 Grade View: Grade I Tube type: Oral Tube size: 7.0 mm Number of attempts: 1 Airway Equipment and Method: Stylet Placement Confirmation: ETT inserted through vocal cords under direct vision,  positive ETCO2 and breath sounds checked- equal and bilateral Secured at: 21 cm Tube secured with: Tape Dental Injury: Teeth and Oropharynx as per pre-operative assessment  Comments: Smooth IV induction- intubation AM atraumatic- teeth and mouth as preop-  Missing many teeth and loose teeth present esp on bottom-  Judd +  bilat BS

## 2016-03-21 NOTE — Procedures (Signed)
Interventional Radiology Procedure Note  Procedure: Thermal ablation of [place] with CT guidance.   Complications: None  Estimated Blood Loss: 0   Recommendations: - Bedrest x 6 hrs - ADAT - SCDs - Labs in am  Signed,  Criselda Peaches, MD

## 2016-03-21 NOTE — Anesthesia Postprocedure Evaluation (Signed)
Anesthesia Post Note  Patient: Samantha [name]  Procedure(s) Performed: Procedure(s) (LRB): MICROWAVE ABLATION - LIVER (N/A)  Patient location during evaluation: PACU Anesthesia Type: General Level of consciousness: awake and alert Pain management: pain level controlled Vital Signs Assessment: post-procedure vital signs reviewed and stable Respiratory status: spontaneous breathing, nonlabored ventilation, respiratory function stable and patient connected to nasal cannula oxygen Cardiovascular status: blood pressure returned to baseline and stable Postop Assessment: no signs of nausea or vomiting Anesthetic complications: no    Last Vitals:  Filed Vitals:   03/21/16 1200 03/21/16 1212  BP: 137/79 143/77  Pulse: 89 94  Temp: 36.3 C 36.3 C  Resp: 18 18    Last Pain:  Filed Vitals:   03/21/16 1223  PainSc: 10-Worst pain ever                 Phelan Schadt JENNETTE

## 2016-03-21 NOTE — Transfer of Care (Signed)
Immediate Anesthesia Transfer of Care Note  Patient: ASENCION DEMEESTER  Procedure(s) Performed: Procedure(s): MICROWAVE ABLATION - LIVER (N/A)  Patient Location: PACU  Anesthesia Type:General  Level of Consciousness: awake  Airway & Oxygen Therapy: Patient Spontanous Breathing and Patient connected to face mask oxygen  Post-op Assessment: Report given to RN and Post -op Vital signs reviewed and stable  Post vital signs: Reviewed and stable  Last Vitals:  Filed Vitals:   03/21/16 0641  BP: 127/90  Pulse: 81  Temp: 36.7 C  Resp: 18    Complications: No apparent anesthesia complications

## 2016-03-21 NOTE — Anesthesia Preprocedure Evaluation (Signed)
Anesthesia Evaluation  Patient identified by MRN, date of birth, ID band Patient awake    Reviewed: Allergy & Precautions, NPO status , Patient's Chart, lab work & pertinent test results  History of Anesthesia Complications Negative for: history of anesthetic complications  Airway Mallampati: II  TM Distance: >3 FB Neck ROM: Full    Dental no notable dental hx. (+) Partial Upper, Dental Advisory Given   Pulmonary former smoker,    Pulmonary exam normal breath sounds clear to auscultation       Cardiovascular hypertension, Pt. on medications Normal cardiovascular exam Rhythm:Regular Rate:Normal     Neuro/Psych negative neurological ROS  negative psych ROS   GI/Hepatic GERD  Medicated and Controlled,(+) Hepatitis -, C  Endo/Other  Morbid obesity  Renal/GU negative Renal ROS  negative genitourinary   Musculoskeletal  (+) Arthritis ,   Abdominal   Peds negative pediatric ROS (+)  Hematology negative hematology ROS (+)   Anesthesia Other Findings   Reproductive/Obstetrics negative OB ROS                             Anesthesia Physical Anesthesia Plan  ASA: III  Anesthesia Plan: General   Post-op Pain Management:    Induction: Intravenous  Airway Management Planned: Oral ETT  Additional Equipment:   Intra-op Plan:   Post-operative Plan: Extubation in OR  Informed Consent: I have reviewed the patients History and Physical, chart, labs and discussed the procedure including the risks, benefits and alternatives for the proposed anesthesia with the patient or authorized representative who has indicated his/her understanding and acceptance.   Dental advisory given  Plan Discussed with: CRNA  Anesthesia Plan Comments:         Anesthesia Quick Evaluation

## 2016-03-22 DIAGNOSIS — C220 Liver cell carcinoma: Secondary | ICD-10-CM | POA: Diagnosis not present

## 2016-03-22 DIAGNOSIS — Z7982 Long term (current) use of aspirin: Secondary | ICD-10-CM | POA: Diagnosis not present

## 2016-03-22 DIAGNOSIS — K7581 Nonalcoholic steatohepatitis (NASH): Secondary | ICD-10-CM | POA: Diagnosis not present

## 2016-03-22 DIAGNOSIS — K219 Gastro-esophageal reflux disease without esophagitis: Secondary | ICD-10-CM | POA: Diagnosis not present

## 2016-03-22 DIAGNOSIS — Z9049 Acquired absence of other specified parts of digestive tract: Secondary | ICD-10-CM | POA: Diagnosis not present

## 2016-03-22 DIAGNOSIS — E785 Hyperlipidemia, unspecified: Secondary | ICD-10-CM | POA: Diagnosis not present

## 2016-03-22 DIAGNOSIS — R110 Nausea: Secondary | ICD-10-CM | POA: Diagnosis not present

## 2016-03-22 DIAGNOSIS — K7460 Unspecified cirrhosis of liver: Secondary | ICD-10-CM | POA: Diagnosis not present

## 2016-03-22 DIAGNOSIS — I10 Essential (primary) hypertension: Secondary | ICD-10-CM | POA: Diagnosis not present

## 2016-03-22 DIAGNOSIS — Z79899 Other long term (current) drug therapy: Secondary | ICD-10-CM | POA: Diagnosis not present

## 2016-03-22 DIAGNOSIS — Z87891 Personal history of nicotine dependence: Secondary | ICD-10-CM | POA: Diagnosis not present

## 2016-03-22 DIAGNOSIS — Z96651 Presence of right artificial knee joint: Secondary | ICD-10-CM | POA: Diagnosis not present

## 2016-03-22 LAB — COMPREHENSIVE METABOLIC PANEL
ALT: 91 U/L — ABNORMAL HIGH (ref 14–54)
ANION GAP: 8 (ref 5–15)
AST: 136 U/L — ABNORMAL HIGH (ref 15–41)
Albumin: 2.9 g/dL — ABNORMAL LOW (ref 3.5–5.0)
Alkaline Phosphatase: 97 U/L (ref 38–126)
BILIRUBIN TOTAL: 0.6 mg/dL (ref 0.3–1.2)
BUN: 13 mg/dL (ref 6–20)
CO2: 26 mmol/L (ref 22–32)
Calcium: 8.3 mg/dL — ABNORMAL LOW (ref 8.9–10.3)
Chloride: 108 mmol/L (ref 101–111)
Creatinine, Ser: 0.84 mg/dL (ref 0.44–1.00)
GFR calc Af Amer: >60 mL/min (ref >60)
Glucose, Bld: 127 mg/dL — ABNORMAL HIGH (ref 65–99)
POTASSIUM: 3.7 mmol/L (ref 3.5–5.1)
Sodium: 142 mmol/L (ref 135–145)
TOTAL PROTEIN: 6 g/dL — AB (ref 6.5–8.1)

## 2016-03-22 LAB — CBC
HEMATOCRIT: 35.2 % — AB (ref 36.0–46.0)
Hemoglobin: 11.2 g/dL — ABNORMAL LOW (ref 12.0–15.0)
MCH: 27.1 pg (ref 26.0–34.0)
MCHC: 31.8 g/dL (ref 30.0–36.0)
MCV: 85 fL (ref 78.0–100.0)
Platelets: 170 10*3/uL (ref 150–400)
RBC: 4.14 MIL/uL (ref 3.87–5.11)
RDW: 14.1 % (ref 11.5–15.5)
WBC: 5 10*3/uL (ref 4.0–10.5)

## 2016-03-22 NOTE — Progress Notes (Signed)
Patient discharged to home, all discharge medications and instructions reviewed and questions answered.  Patient to be assisted top vehicle by wheelchair when ride arrives.

## 2016-03-22 NOTE — Discharge Summary (Signed)
Patient ID: Samantha [name] MRN: [id number] DOB/AGE: 67-Jun-1952 67 y.o.  Admit date: 03/21/2016 Discharge date: 03/22/2016  Supervising Physician: Sandi Mariscal  Admission Diagnoses: Hepatocellular carcinoma  Discharge Diagnoses:  Hepatocellular carcinoma   Discharged Condition: good  Hospital Course:  Samantha [name] is know to our service from recent visceral/hepatic arteriogram with successful superselective transarterial chemoembolization of the segment 8 hepatocellular carcinoma utilizing lipiodol and doxorubicin/mitomycin-C on 02/21/16.   She presented again yesterday for staged CT-guided thermal ablation of the [place].   Yesterday afternoon she was having some nausea.  Phenergan was ordered and this helped her significantly.  She is tolerating a diet this morning.  She is currently doing well   Only complain is moderate discomfort in the RUQ which is relieved with PO pain medications.   She has ambulated and voided.  She would like to be dicharged home.  Consults: None  Treatments:  INDICATION: 65 year old female with biopsy-proven hepatocellular carcinoma. She underwent chemo embolization with conventional tace on 02/21/2016 and presents today for definitive microwave ablation therapy.  EXAM: CT-GUIDED PERCUTANEOUS THERMAL ABLATION  COMPARISON: Prior MRI abdomen 12/18/2015; embolization images 02/21/2016  MEDICATIONS: Zosyn 3.375 g; The antibiotic was administered in an appropriate time interval prior to needle puncture of the skin.  ANESTHESIA/SEDATION: General - as administered by the Anesthesia department  FLUOROSCOPY TIME: None  COMPLICATIONS: None immediate.  Estimated blood loss: None  TECHNIQUE: Informed written consent was obtained from the patient after a thorough discussion of the procedural risks, benefits and alternatives. All questions were addressed. Maximal Sterile Barrier Technique was utilized including caps, mask, sterile  gowns, sterile gloves, sterile drape, hand hygiene and skin antiseptic. A timeout was performed prior to the initiation of the procedure.  A planning axial CT scan was performed. Suitable skin entry sites were selected and marked. The patient was then sterilely prepped and draped in standard fashion using chlorhexidine skin prep. Small dermatotomy is were made. Under intermittent CT guidance, 215 cm P are probes were advanced into the lesion.  Once positioning was secured, ablation was performed with both antenna powered at 65 watts for 10 minutes. A post ablation CT scan with contrast was performed showing significant retraction of the previously embolized tumor. There appears to be a halo of low attenuation surrounding the lesion consistent with ablation margin. No evidence of bleeding or other active complication.  FINDINGS: Technically successful percutaneous thermal ablation of previously embolized hepatocellular carcinoma.  IMPRESSION: Technically successful percutaneous thermal ablation of previously embolized hepatocellular carcinoma.  Signed,  Criselda Peaches, MD  [facility]  [facility]   Electronically Signed  By: [name] M.D.  On: 03/21/2016 17:25   Discharge Exam: Blood pressure 102/54, pulse 85, temperature 98.4 F (36.9 C), temperature source Oral, resp. rate 18, height 5\' 3"  (1.6 m), weight 284 lb (128.822 kg), SpO2 100 %. Awake and Alert Heart RRR Lungs clear Abdomen soft, only mild tenderness at RUQ Stick site OK, no bleeding Mild elevation in LFTs  CMP     Component Value Date/Time   NA 142 03/22/2016 0340   K 3.7 03/22/2016 0340   CL 108 03/22/2016 0340   CO2 26 03/22/2016 0340   GLUCOSE 127* 03/22/2016 0340   BUN 13 03/22/2016 0340   CREATININE 0.84 03/22/2016 0340   CREATININE 0.79 01/02/2016 0945   CALCIUM 8.3* 03/22/2016 0340   PROT 6.0* 03/22/2016 0340    ALBUMIN 2.9* 03/22/2016 0340   AST 136* 03/22/2016 0340   ALT  91* 03/22/2016 0340   ALKPHOS 97 03/22/2016 0340   BILITOT 0.6 03/22/2016 0340   GFRNONAA >60 03/22/2016 0340   GFRNONAA 78 01/02/2016 0945   GFRAA >60 03/22/2016 0340   GFRAA >89 01/02/2016 0945     Disposition: 01-Home or Self Care   Rx for Oxy IR 5 mg 1 PO q 4 hours prn pain #30  *She will follow up with Dr. Laurence Ferrari in 2 weeks with repeat CBC, CMP, and INR. I have placed this order.    Medication List    TAKE these medications        amLODipine 5 MG tablet  Commonly known as:  NORVASC  Take 1 tablet (5 mg total) by mouth daily.     aspirin 81 MG tablet  Take 81 mg by mouth daily.     EQL VITAMIN D3 1000 units tablet  Generic drug:  Cholecalciferol  Take 1,000 Units by mouth daily.     fluticasone 50 MCG/ACT nasal spray  Commonly known as:  FLONASE  Place 2 sprays into the nose daily.     furosemide 20 MG tablet  Commonly known as:  LASIX  Take 1 tablet (20 mg total) by mouth daily.     Ledipasvir-Sofosbuvir 90-400 MG Tabs  Commonly known as:  HARVONI  Take 1 tablet by mouth daily.     loratadine 10 MG tablet  Commonly known as:  CLARITIN  Take 1 tablet (10 mg total) by mouth daily.     losartan 100 MG tablet  Commonly known as:  COZAAR  Take 1 tablet (100 mg total) by mouth daily.     multivitamin with minerals Tabs tablet  Take 1 tablet by mouth daily.     ondansetron 8 MG tablet  Commonly known as:  ZOFRAN     oxyCODONE 5 MG immediate release tablet  Commonly known as:  Oxy IR/ROXICODONE     pantoprazole 40 MG tablet  Commonly known as:  PROTONIX  Take 1 tablet (40 mg total) by mouth daily.     potassium chloride SA 20 MEQ tablet  Commonly known as:  K-DUR,KLOR-CON  Take 1 tablet (20 mEq total) by mouth daily.          Electronically Signed: Murrell Redden PA-C 03/22/2016, 8:32 AM   I have spent Less Than 30 Minutes discharging Samantha [name].

## 2016-03-22 NOTE — Care Management Note (Signed)
Case Management Note  Patient Details  Name: Samantha [name] MRN: [id number] Date of Birth: 05/27/1951  Subjective/Objective:    Hepatocellular carcinoma                Action/Plan: Discharge Planning: AVS reviewed: NCM spoke to pt at bedside. Pt states she has a roommate that assist her at home. No NCM needs identified.   PCP-  Cassandria Anger MD  Expected Discharge Date:  03/22/16               Expected Discharge Plan:  Home/Self Care  In-House Referral:  NA  Discharge planning Services  CM Consult  Post Acute Care Choice:  NA Choice offered to:  NA  DME Arranged:  N/A DME Agency:  NA  HH Arranged:  NA HH Agency:  NA  Status of Service:  Completed, signed off  Medicare Important Message Given:    Date Medicare IM Given:    Medicare IM give by:    Date Additional Medicare IM Given:    Additional Medicare Important Message give by:     If discussed at [place] of Stay Meetings, dates discussed:    Additional Comments:  Erenest Rasher, RN 03/22/2016, 1:53 PM

## 2016-03-22 NOTE — Care Management Obs Status (Signed)
[place] NOTIFICATION   Patient Details  Name: Samantha [name] MRN: [id number] Date of Birth: 09-20-1951   Medicare Observation Status Notification Given:  Yes MOON given to and signed by pt; original to [place] for scanning into medical records  Erenest Rasher, RN 03/22/2016, 10:34 AM

## 2016-03-24 ENCOUNTER — Other Ambulatory Visit: Payer: Self-pay | Admitting: *Deleted

## 2016-03-24 ENCOUNTER — Other Ambulatory Visit (HOSPITAL_COMMUNITY): Payer: Self-pay | Admitting: Interventional Radiology

## 2016-03-24 ENCOUNTER — Telehealth: Payer: Self-pay | Admitting: *Deleted

## 2016-03-24 DIAGNOSIS — C220 Liver cell carcinoma: Secondary | ICD-10-CM

## 2016-03-24 DIAGNOSIS — Z01818 Encounter for other preprocedural examination: Secondary | ICD-10-CM

## 2016-03-24 NOTE — Telephone Encounter (Signed)
Pt was on TCM list admitted for Hepatocellular Carcinoma. D/C 3/25/417 will f/u w/Dr. Geroge Baseman in 2 wks.,..Samantha [name]

## 2016-04-01 ENCOUNTER — Encounter: Payer: Self-pay | Admitting: Gastroenterology

## 2016-04-01 ENCOUNTER — Ambulatory Visit (INDEPENDENT_AMBULATORY_CARE_PROVIDER_SITE_OTHER): Payer: Medicare Other | Admitting: Gastroenterology

## 2016-04-01 VITALS — BP 130/70 | HR 82 | Ht 63.0 in | Wt 283.6 lb

## 2016-04-01 DIAGNOSIS — C220 Liver cell carcinoma: Secondary | ICD-10-CM

## 2016-04-01 DIAGNOSIS — K7460 Unspecified cirrhosis of liver: Secondary | ICD-10-CM | POA: Diagnosis not present

## 2016-04-01 NOTE — Progress Notes (Addendum)
     04/01/2016 Samantha [name] [id number] 1951-08-24   History of Present Illness:  This is a 65 year old female who is known to Dr. Hilarie Fredrickson for colonoscopy in January 2016. 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  Follows with Dr. Linus Salmons of ID, transplant MD at [facility], Dr. Burr Medico from oncology, and Dr. Laurence Ferrari from [place].  Here to schedule EGD for screening of esophageal varices.  Has completed Hep C treatment with Harvoni and just underwent TACE for her [place] on 3/24.  Feels good.  Abdomen just slightly sore.  No sign of GI bleeding.  Recent INR 1.15, platelets 183, Hgb 11.6 grams.  Current Medications, Allergies, Past Medical History, Past Surgical History, Family History and Social History were reviewed in [employer] record.   Physical Exam: BP 130/70 mmHg  Pulse 82  Ht 5\' 3"  (1.6 m)  Wt 283 lb 9.6 oz (128.64 kg)  BMI 50.25 kg/m2 General: Well developed black female in no acute distress Head: Normocephalic and atraumatic Eyes:  Sclerae anicteric, conjunctiva pink  Ears: Normal auditory acuity Lungs: Clear throughout to auscultation Heart: Regular rate and rhythm Abdomen: Soft, non-distended.  Normal bowel sounds.  Non-tender. Musculoskeletal: Symmetrical with no gross deformities  Extremities: No edema  Neurological: Alert oriented x 4, grossly non-focal Psychological:  Alert and cooperative. Normal mood and affect  Assessment and Recommendations: -Cirrhosis secondary to Hep C with HCC:  Follows with Dr. Linus Salmons from ID, transplant MD at [facility], Dr. Burr Medico from oncology, and Dr. Laurence Ferrari from [place].  Here to schedule EGD for variceal screening.  Will schedule with Dr. Hilarie Fredrickson at [place] (BMI > 50).  The risks, benefits, and alternatives were discussed with the patient and she consents to proceed.    Addendum: Reviewed and agree with initial management. Jerene Bears, MD

## 2016-04-01 NOTE — Patient Instructions (Signed)

## 2016-04-07 ENCOUNTER — Other Ambulatory Visit: Payer: Self-pay

## 2016-04-07 DIAGNOSIS — Z1231 Encounter for screening mammogram for malignant neoplasm of breast: Secondary | ICD-10-CM

## 2016-04-07 DIAGNOSIS — C220 Liver cell carcinoma: Secondary | ICD-10-CM | POA: Diagnosis not present

## 2016-04-07 LAB — CBC
HEMATOCRIT: 41.1 % (ref 35.0–45.0)
Hemoglobin: 13.1 g/dL (ref 11.7–15.5)
MCH: 26.3 pg — AB (ref 27.0–33.0)
MCHC: 31.9 g/dL — ABNORMAL LOW (ref 32.0–36.0)
MCV: 82.5 fL (ref 80.0–100.0)
MPV: 10.5 fL (ref 7.5–12.5)
Platelets: 264 10*3/uL (ref 140–400)
RBC: 4.98 MIL/uL (ref 3.80–5.10)
RDW: 14 % (ref 11.0–15.0)
WBC: 5.3 10*3/uL (ref 3.8–10.8)

## 2016-04-08 LAB — COMPLETE METABOLIC PANEL WITH GFR
ALT: 40 U/L — AB (ref 6–29)
AST: 37 U/L — AB (ref 10–35)
Albumin: 3.9 g/dL (ref 3.6–5.1)
Alkaline Phosphatase: 110 U/L (ref 33–130)
BILIRUBIN TOTAL: 0.4 mg/dL (ref 0.2–1.2)
BUN: 16 mg/dL (ref 7–25)
CALCIUM: 9.5 mg/dL (ref 8.6–10.4)
CHLORIDE: 107 mmol/L (ref 98–110)
CO2: 21 mmol/L (ref 20–31)
CREATININE: 0.85 mg/dL (ref 0.50–0.99)
GFR, Est African American: 83 mL/min (ref >=60)
GFR, Est Non African American: 72 mL/min (ref >=60)
Glucose, Bld: 95 mg/dL (ref 65–99)
Potassium: 4.2 mmol/L (ref 3.5–5.3)
Sodium: 142 mmol/L (ref 135–146)
TOTAL PROTEIN: 7.4 g/dL (ref 6.1–8.1)

## 2016-04-08 LAB — PROTIME-INR
INR: 1.07 (ref <1.50)
PROTHROMBIN TIME: 14 s (ref 11.6–15.2)

## 2016-04-10 ENCOUNTER — Ambulatory Visit
Admission: RE | Admit: 2016-04-10 | Discharge: 2016-04-10 | Disposition: A | Discharge status: Home / Self Care | Payer: Medicare Other | Source: Ambulatory Visit | Attending: Interventional Radiology | Admitting: Interventional Radiology

## 2016-04-10 DIAGNOSIS — Z01818 Encounter for other preprocedural examination: Secondary | ICD-10-CM

## 2016-04-10 DIAGNOSIS — C220 Liver cell carcinoma: Secondary | ICD-10-CM | POA: Diagnosis not present

## 2016-04-10 NOTE — Progress Notes (Signed)
Chief Complaint: Patient was seen in consultation today for  Chief Complaint  Patient presents with  . Follow-up    3 wk follow up Thermal Ablation of Liver   at the request of McCullough,Heath  Referring Physician(s): McCullough,Heath Dr. Truitt Merle  History of Present Illness: BRITAINY [name] is a 65 y.o. 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 She underwent conventional lipiodol transarterial chemo embolization on 2/23 and then subsequent CT guided thermal ablation of this lesion on 03/21/2016 She presents today for follow up and review of labs. She is doing very well. No real pain, appetite is good. No fevers, chills, N/V.   Past Medical History  Diagnosis Date  . Hepatitis C   . Hypertension   . Elevated glucose     no longer an issue 03-18-16  . LBP (low back pain)   . Obesity   . Osteoarthritis     B knees  . Hyperlipidemia 2011  . GERD (gastroesophageal reflux disease)   . Allergy     seasonal  . Cancer ([place])     liver cancer  . H/O seasonal allergies     Past Surgical History  Procedure Laterality Date  . Right knee replacement  2007  . Left shoulder arthroplasty    . Total knee replacement l  03-13-08  . Cholecystectomy    . Tubal ligation    . Joint replacement    . Colonoscopy N/A 01/09/2015    Procedure: COLONOSCOPY;  Surgeon: Jerene Bears, MD;  Location: [facility];  Service: Gastroenterology;  Laterality: N/A;    Allergies: Benazepril hcl and Codeine  Medications: Prior to Admission medications   Medication Sig Start Date End Date Taking? Authorizing Provider  amLODipine (NORVASC) 5 MG tablet Take 1 tablet (5 mg total) by mouth daily. 08/14/15  Yes Aleksei Plotnikov V, MD  aspirin 81 MG tablet Take 81 mg by mouth daily.     Yes Historical Provider, MD  Cholecalciferol (EQL VITAMIN D3) 1000 UNITS tablet Take 1,000 Units by mouth daily.     Yes Historical Provider, MD    fluticasone (FLONASE) 50 MCG/ACT nasal spray Place 2 sprays into the nose daily. Patient taking differently: Place 2 sprays into the nose daily as needed for allergies.  03/23/12  Yes Aleksei Plotnikov V, MD  furosemide (LASIX) 20 MG tablet Take 1 tablet (20 mg total) by mouth daily. 08/14/15  Yes Aleksei Plotnikov V, MD  loratadine (CLARITIN) 10 MG tablet Take 1 tablet (10 mg total) by mouth daily. 08/14/15  Yes Aleksei Plotnikov V, MD  losartan (COZAAR) 100 MG tablet Take 1 tablet (100 mg total) by mouth daily. 08/14/15  Yes Aleksei Plotnikov V, MD  Multiple Vitamin (MULTIVITAMIN WITH MINERALS) TABS tablet Take 1 tablet by mouth daily.   Yes Historical Provider, MD  ondansetron (ZOFRAN) 8 MG tablet  02/22/16  Yes Historical Provider, MD  oxyCODONE (OXY IR/ROXICODONE) 5 MG immediate release tablet Take 5 mg by mouth every 6 (six) hours as needed.  02/22/16  Yes Historical Provider, MD  pantoprazole (PROTONIX) 40 MG tablet Take 1 tablet (40 mg total) by mouth daily. 08/14/15  Yes Aleksei Plotnikov V, MD  Ledipasvir-Sofosbuvir (HARVONI) 90-400 MG TABS Take 1 tablet by mouth daily. Patient not taking: Reported on 04/10/2016 12/05/15   Thayer Headings, MD  potassium chloride SA (K-DUR,KLOR-CON) 20 MEQ tablet Take 1 tablet (20 mEq total) by mouth daily. 08/14/15   Tyrone Apple  Plotnikov V, MD     Family History  Problem Relation Age of Onset  . Diabetes Other   . Hypertension Other   . Hypertension Mother   . Ovarian cancer Mother   . Hypertension Father   . Cancer Father 12    prostate cancer   . Colon cancer Neg Hx   . Esophageal cancer Neg Hx   . Rectal cancer Neg Hx   . Stomach cancer Neg Hx     Social History   Social History  . Marital Status: Single    Spouse Name: N/A  . Number of Children: 4  . Years of Education: N/A   Social History Main Topics  . Smoking status: Former Smoker -- 0.25 packs/day for 20 years    Quit date: 12/30/2007  . Smokeless tobacco: Never Used  . 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 Drug Use: No  . Sexual Activity: Not Currently   Other Topics Concern  . Not on file   Social History Narrative   Regular exercise: walk a few times a week   Caffeine use: none   Single, raising her young great granddaughter and watches grandchildren after school   Has a room mate to help prn   Retired from nursing tech--worked at [employer]   Independent/drives     Review of Systems: A 12 point ROS discussed and pertinent positives are indicated in the HPI above.  All other systems are negative.  Review of Systems  Vital Signs: BP 137/73 mmHg  Pulse 91  Temp(Src) 98.1 F (36.7 C) (Oral)  Resp 15  SpO2 99%  Physical Exam  Constitutional: She is oriented to person, place, and time. She appears well-developed and well-nourished. No distress.  Cardiovascular: Normal rate, regular rhythm and normal heart sounds.   Pulmonary/Chest: Effort normal and breath sounds normal. No respiratory distress.  Abdominal: Soft. She exhibits no distension. There is no tenderness.  Skin puncture sites well healed  Neurological: She is alert and oriented to person, place, and time.  Psychiatric: She has a normal mood and affect. Judgment normal.     Imaging:  Ct Guide Tissue Ablation  03/21/2016  INDICATION: 65 year old female with biopsy-proven hepatocellular carcinoma. She underwent chemo embolization with conventional tace on 02/21/2016 and presents today for definitive microwave ablation therapy. EXAM: CT-GUIDED PERCUTANEOUS THERMAL ABLATION COMPARISON:  Prior MRI abdomen 12/18/2015; embolization images 02/21/2016 MEDICATIONS: Zosyn 3.375 g; The antibiotic was administered in an appropriate time interval prior to needle puncture of the skin. ANESTHESIA/SEDATION: General - as administered by the Anesthesia department FLUOROSCOPY TIME:  None COMPLICATIONS: None immediate. Estimated blood  loss: None TECHNIQUE: Informed written consent was obtained from the patient after a thorough discussion of the procedural risks, benefits and alternatives. All questions were addressed. Maximal Sterile Barrier Technique was utilized including caps, mask, sterile gowns, sterile gloves, sterile drape, hand hygiene and skin antiseptic. A timeout was performed prior to the initiation of the procedure. A planning axial CT scan was performed. Suitable skin entry sites were selected and marked. The patient was then sterilely prepped and draped in standard fashion using chlorhexidine skin prep. Small dermatotomy is were made. Under intermittent CT guidance, 215 cm P are probes were advanced into the lesion. Once positioning was secured, ablation was performed with both antenna powered at 65 watts for 10 minutes. A post ablation CT scan with contrast  was performed showing significant retraction of the previously embolized tumor. There appears to be a halo of low attenuation surrounding the lesion consistent with ablation margin. No evidence of bleeding or other active complication. FINDINGS: Technically successful percutaneous thermal ablation of previously embolized hepatocellular carcinoma. IMPRESSION: Technically successful percutaneous thermal ablation of previously embolized hepatocellular carcinoma. Signed, Criselda Peaches, MD [facility] Radiology Electronically Signed   By: [name] M.D.   On: 03/21/2016 17:25    Labs:  CBC:  Recent Labs  03/22/16 0340 04/07/16 1308  WBC 5.0 5.3  HGB 11.2* 13.1  HCT 35.2* 41.1  PLT 170 264    COAGS:  Recent Labs  04/07/16 1308  INR 1.07  APTT  --     BMP:  Recent Labs  03/22/16 0340 04/07/16 1308  NA 142 142  K 3.7 4.2  CL 108 107  CO2 26 21  GLUCOSE 127* 95  BUN 13 16  CALCIUM 8.3* 9.5  CREATININE 0.84 0.85  GFRNONAA >60 72  GFRAA >60 83    LIVER FUNCTION TESTS:  Recent  Labs  03/22/16 0340 04/07/16 1308  BILITOT 0.6 0.4  AST 136* 37*  ALT 91* 40*  ALKPHOS 97 110  PROT 6.0* 7.4  ALBUMIN 2.9* 3.9    TUMOR MARKERS:  Recent Labs  12/20/15 1143  AFPTM 21.7*    Assessment and Plan: S/p conventional lipiodol transarterial chemo embolization on 2/23 followed by subsequent CT guided thermal ablation of this lesion on 03/21/2016. She is doing well. Labs are good as expected. Will set her up for follow up MRI of the abdomen in a couple months, late June and see her back after that.   Electronically Signed: Ascencion Dike 04/10/2016, 11:18 AM   I spent a total of 20 minutes in face to face in clinical consultation, greater than 50% of which was counseling/coordinating care for thermal ablation of [facility]

## 2016-04-14 ENCOUNTER — Ambulatory Visit (INDEPENDENT_AMBULATORY_CARE_PROVIDER_SITE_OTHER): Payer: Medicare Other | Admitting: Internal Medicine

## 2016-04-14 ENCOUNTER — Encounter: Payer: Self-pay | Admitting: Internal Medicine

## 2016-04-14 DIAGNOSIS — B182 Chronic viral hepatitis C: Secondary | ICD-10-CM | POA: Diagnosis not present

## 2016-04-14 DIAGNOSIS — K7469 Other cirrhosis of liver: Secondary | ICD-10-CM

## 2016-04-14 DIAGNOSIS — C220 Liver cell carcinoma: Secondary | ICD-10-CM | POA: Diagnosis not present

## 2016-04-14 DIAGNOSIS — I10 Essential (primary) hypertension: Secondary | ICD-10-CM

## 2016-04-14 DIAGNOSIS — R609 Edema, unspecified: Secondary | ICD-10-CM | POA: Insufficient documentation

## 2016-04-14 DIAGNOSIS — R600 Localized edema: Secondary | ICD-10-CM

## 2016-04-14 MED ORDER — PANTOPRAZOLE SODIUM 40 MG PO TBEC: 40.0000 mg | DELAYED_RELEASE_TABLET | Freq: Every day | ORAL | Status: DC

## 2016-04-14 MED ORDER — FUROSEMIDE 20 MG PO TABS: 20.0000 mg | ORAL_TABLET | Freq: Every day | ORAL | Status: DC

## 2016-04-14 MED ORDER — IBUPROFEN 400 MG PO TABS: 400.0000 mg | ORAL_TABLET | Freq: Two times a day (BID) | ORAL | Status: DC | PRN

## 2016-04-14 MED ORDER — AMLODIPINE BESYLATE 5 MG PO TABS: 5.0000 mg | ORAL_TABLET | Freq: Every day | ORAL | Status: DC

## 2016-04-14 MED ORDER — LORATADINE 10 MG PO TABS: 10.0000 mg | ORAL_TABLET | Freq: Every day | ORAL | Status: DC

## 2016-04-14 MED ORDER — LOSARTAN POTASSIUM 100 MG PO TABS: 100.0000 mg | ORAL_TABLET | Freq: Every day | ORAL | Status: DC

## 2016-04-14 MED ORDER — POTASSIUM CHLORIDE CRYS ER 20 MEQ PO TBCR: 20.0000 meq | EXTENDED_RELEASE_TABLET | Freq: Every day | ORAL | Status: DC

## 2016-04-14 NOTE — Assessment & Plan Note (Signed)
2016 multifactorial Lasix, Wt loss

## 2016-04-14 NOTE — Assessment & Plan Note (Signed)
Finished Rx in 2016 Labs

## 2016-04-14 NOTE — Assessment & Plan Note (Signed)
02/2016 - treated by Dr Dolores Patty (IR)

## 2016-04-14 NOTE — Progress Notes (Signed)
Pre visit review using our clinic review tool, if applicable. No additional management support is needed unless otherwise documented below in the visit note. 

## 2016-04-14 NOTE — Progress Notes (Signed)
Subjective:  Patient ID: Samantha [name], female    DOB: [birth year]  Age: 65 y.o. MRN: [id number]  CC: No chief complaint on file.   HPI Samantha [name] presents for liver Ca, Hep C. C/o LE edema. C/o occ RUQ/ R LBP: Oxycodone is too strong..the patient asked to be seen q 6 mo  Outpatient Prescriptions Prior to Visit  Medication Sig Dispense Refill  . aspirin 81 MG tablet Take 81 mg by mouth daily. Reported on 04/14/2016    . Cholecalciferol (EQL VITAMIN D3) 1000 UNITS tablet Take 1,000 Units by mouth daily. Reported on 04/14/2016    . fluticasone (FLONASE) 50 MCG/ACT nasal spray Place 2 sprays into the nose daily. 16 g 4  . Multiple Vitamin (MULTIVITAMIN WITH MINERALS) TABS tablet Take 1 tablet by mouth daily. Reported on 04/14/2016    . ondansetron (ZOFRAN) 8 MG tablet Reported on 04/14/2016    . amLODipine (NORVASC) 5 MG tablet Take 1 tablet (5 mg total) by mouth daily. 90 tablet 3  . furosemide (LASIX) 20 MG tablet Take 1 tablet (20 mg total) by mouth daily. 90 tablet 3  . loratadine (CLARITIN) 10 MG tablet Take 1 tablet (10 mg total) by mouth daily. 100 tablet 3  . losartan (COZAAR) 100 MG tablet Take 1 tablet (100 mg total) by mouth daily. 90 tablet 3  . oxyCODONE (OXY IR/ROXICODONE) 5 MG immediate release tablet Take 5 mg by mouth every 6 (six) hours as needed. Reported on 04/14/2016    . pantoprazole (PROTONIX) 40 MG tablet Take 1 tablet (40 mg total) by mouth daily. 90 tablet 3  . potassium chloride SA (K-DUR,KLOR-CON) 20 MEQ tablet Take 1 tablet (20 mEq total) by mouth daily. 90 tablet 3  . Ledipasvir-Sofosbuvir (HARVONI) 90-400 MG TABS Take 1 tablet by mouth daily. (Patient not taking: Reported on 04/14/2016) 28 tablet 2   No facility-administered medications prior to visit.    ROS Review of Systems  Constitutional: Negative for chills, activity change, appetite change, fatigue and unexpected weight change.  HENT: Negative for congestion, mouth sores and sinus pressure.     Eyes: Negative for visual disturbance.  Respiratory: Negative for cough and chest tightness.   Cardiovascular: Positive for leg swelling.  Gastrointestinal: Positive for abdominal pain. Negative for nausea.  Genitourinary: Negative for frequency, difficulty urinating and vaginal pain.  Musculoskeletal: Positive for back pain. Negative for gait problem.  Skin: Negative for pallor and rash.  Neurological: Negative for dizziness, tremors, weakness, numbness and headaches.  Psychiatric/Behavioral: Negative for suicidal ideas, confusion and sleep disturbance. The patient is not nervous/anxious.     Objective:  BP 110/70 mmHg  Pulse 93  Wt 283 lb (128.368 kg)  SpO2 99%  BP Readings from Last 3 Encounters:  04/14/16 110/70  04/10/16 137/73  04/01/16 130/70    Wt Readings from Last 3 Encounters:  04/14/16 283 lb (128.368 kg)  04/01/16 283 lb 9.6 oz (128.64 kg)  03/21/16 284 lb (128.822 kg)    Physical Exam  Constitutional: She appears well-developed. No distress.  HENT:  Head: Normocephalic.  Right Ear: External ear normal.  Left Ear: External ear normal.  Nose: Nose normal.  Mouth/Throat: Oropharynx is clear and moist.  Eyes: Conjunctivae are normal. Pupils are equal, round, and reactive to light. Right eye exhibits no discharge. Left eye exhibits no discharge.  Neck: Normal range of motion. Neck supple. No JVD present. No tracheal deviation present. No thyromegaly present.  Cardiovascular: Normal rate, regular rhythm and normal  heart sounds.   Pulmonary/Chest: No stridor. No respiratory distress. She has no wheezes.  Abdominal: Soft. Bowel sounds are normal. She exhibits no distension and no mass. There is no tenderness. There is no rebound and no guarding.  Musculoskeletal: She exhibits edema and tenderness.  Lymphadenopathy:    She has no cervical adenopathy.  Neurological: She displays normal reflexes. No cranial nerve deficit. She exhibits normal muscle tone. Coordination  normal.  Skin: No rash noted. No erythema.  Psychiatric: She has a normal mood and affect. Her behavior is normal. Judgment and thought content normal.  Trace edema B LEs RUQ is sensitive Obese  Lab Results  Component Value Date   WBC 5.3 04/07/2016   HGB 13.1 04/07/2016   HCT 41.1 04/07/2016   PLT 264 04/07/2016   GLUCOSE 95 04/07/2016   CHOL 145 04/13/2015   TRIG 83.0 04/13/2015   HDL 50.00 04/13/2015   LDLCALC 78 04/13/2015   ALT 40* 04/07/2016   AST 37* 04/07/2016   NA 142 04/07/2016   K 4.2 04/07/2016   CL 107 04/07/2016   CREATININE 0.85 04/07/2016   BUN 16 04/07/2016   CO2 21 04/07/2016   TSH 1.65 04/13/2015   INR 1.07 04/07/2016   HGBA1C 5.8 08/14/2015    No results found.  Assessment & Plan:   Diagnoses and all orders for this visit:  Morbid obesity, unspecified obesity type ([place]) -     amLODipine (NORVASC) 5 MG tablet; Take 1 tablet (5 mg total) by mouth daily. -     furosemide (LASIX) 20 MG tablet; Take 1 tablet (20 mg total) by mouth daily. -     losartan (COZAAR) 100 MG tablet; Take 1 tablet (100 mg total) by mouth daily. -     potassium chloride SA (K-DUR,KLOR-CON) 20 MEQ tablet; Take 1 tablet (20 mEq total) by mouth daily.  Essential hypertension  Chronic hepatitis C without hepatic coma (HCC)  Hepatocellular carcinoma (HCC)  Other cirrhosis of liver (HCC)  Localized edema  Other orders -     loratadine (CLARITIN) 10 MG tablet; Take 1 tablet (10 mg total) by mouth daily. -     pantoprazole (PROTONIX) 40 MG tablet; Take 1 tablet (40 mg total) by mouth daily. -     ibuprofen (ADVIL,MOTRIN) 400 MG tablet; Take 1 tablet (400 mg total) by mouth 2 (two) times daily as needed for moderate pain (pc).   I have discontinued Samantha [name]'s oxyCODONE. I am also having her start on ibuprofen. Additionally, I am having her maintain her aspirin, Cholecalciferol, fluticasone, multivitamin with minerals, Ledipasvir-Sofosbuvir, ondansetron, amLODipine,  furosemide, loratadine, losartan, pantoprazole, and potassium chloride SA.  Meds ordered this encounter  Medications  . amLODipine (NORVASC) 5 MG tablet    Sig: Take 1 tablet (5 mg total) by mouth daily.    Dispense:  90 tablet    Refill:  3  . furosemide (LASIX) 20 MG tablet    Sig: Take 1 tablet (20 mg total) by mouth daily.    Dispense:  90 tablet    Refill:  3  . loratadine (CLARITIN) 10 MG tablet    Sig: Take 1 tablet (10 mg total) by mouth daily.    Dispense:  100 tablet    Refill:  3  . losartan (COZAAR) 100 MG tablet    Sig: Take 1 tablet (100 mg total) by mouth daily.    Dispense:  90 tablet    Refill:  3  . pantoprazole (PROTONIX) 40 MG tablet  Sig: Take 1 tablet (40 mg total) by mouth daily.    Dispense:  90 tablet    Refill:  3  . potassium chloride SA (K-DUR,KLOR-CON) 20 MEQ tablet    Sig: Take 1 tablet (20 mEq total) by mouth daily.    Dispense:  90 tablet    Refill:  3  . ibuprofen (ADVIL,MOTRIN) 400 MG tablet    Sig: Take 1 tablet (400 mg total) by mouth 2 (two) times daily as needed for moderate pain (pc).    Dispense:  60 tablet    Refill:  1     Follow-up: Return in about 6 months (around 10/14/2016) for Wellness Exam.  Walker Kehr, MD

## 2016-04-14 NOTE — Assessment & Plan Note (Signed)
Losartan, Norvasc, Lasix 

## 2016-04-15 ENCOUNTER — Other Ambulatory Visit: Payer: Medicare Other

## 2016-05-15 ENCOUNTER — Other Ambulatory Visit (HOSPITAL_COMMUNITY): Payer: Self-pay | Admitting: Interventional Radiology

## 2016-05-15 ENCOUNTER — Other Ambulatory Visit: Payer: Self-pay | Admitting: Radiology

## 2016-05-15 DIAGNOSIS — C220 Liver cell carcinoma: Secondary | ICD-10-CM

## 2016-05-19 ENCOUNTER — Ambulatory Visit
Admission: RE | Admit: 2016-05-19 | Discharge: 2016-05-19 | Disposition: A | Discharge status: Home / Self Care | Payer: Medicare Other | Source: Ambulatory Visit

## 2016-05-19 DIAGNOSIS — Z1231 Encounter for screening mammogram for malignant neoplasm of breast: Secondary | ICD-10-CM

## 2016-05-22 ENCOUNTER — Encounter (HOSPITAL_COMMUNITY): Payer: Self-pay | Admitting: *Deleted

## 2016-06-02 DIAGNOSIS — C220 Liver cell carcinoma: Secondary | ICD-10-CM | POA: Diagnosis not present

## 2016-06-02 LAB — PROTIME-INR
INR: 1.03 (ref <1.50)
Prothrombin Time: 13.6 seconds (ref 11.6–15.2)

## 2016-06-03 LAB — COMPREHENSIVE METABOLIC PANEL
ALT: 17 U/L (ref 6–29)
AST: 22 U/L (ref 10–35)
Albumin: 3.8 g/dL (ref 3.6–5.1)
Alkaline Phosphatase: 98 U/L (ref 33–130)
BUN: 15 mg/dL (ref 7–25)
CO2: 26 mmol/L (ref 20–31)
CREATININE: 0.81 mg/dL (ref 0.50–0.99)
Calcium: 9 mg/dL (ref 8.6–10.4)
Chloride: 105 mmol/L (ref 98–110)
GLUCOSE: 93 mg/dL (ref 65–99)
Potassium: 4.1 mmol/L (ref 3.5–5.3)
SODIUM: 140 mmol/L (ref 135–146)
Total Bilirubin: 0.4 mg/dL (ref 0.2–1.2)
Total Protein: 6.9 g/dL (ref 6.1–8.1)

## 2016-06-03 LAB — CBC
HCT: 40.5 % (ref 35.0–45.0)
Hemoglobin: 12.6 g/dL (ref 11.7–15.5)
MCH: 26.1 pg — AB (ref 27.0–33.0)
MCHC: 31.1 g/dL — ABNORMAL LOW (ref 32.0–36.0)
MCV: 84 fL (ref 80.0–100.0)
MPV: 11.3 fL (ref 7.5–12.5)
PLATELETS: 202 10*3/uL (ref 140–400)
RBC: 4.82 MIL/uL (ref 3.80–5.10)
RDW: 14.2 % (ref 11.0–15.0)
WBC: 4.7 10*3/uL (ref 3.8–10.8)

## 2016-06-03 LAB — AFP TUMOR MARKER: AFP-Tumor Marker: 8.6 ng/mL — ABNORMAL HIGH (ref <6.1)

## 2016-06-10 ENCOUNTER — Encounter (HOSPITAL_COMMUNITY): Payer: Self-pay | Admitting: *Deleted

## 2016-06-10 ENCOUNTER — Ambulatory Visit (HOSPITAL_COMMUNITY): Payer: Medicare Other | Admitting: Anesthesiology

## 2016-06-10 ENCOUNTER — Encounter (HOSPITAL_COMMUNITY)
Admission: RE | Disposition: A | Discharge status: Home / Self Care | Payer: Self-pay | Source: Ambulatory Visit | Attending: Internal Medicine

## 2016-06-10 ENCOUNTER — Ambulatory Visit (HOSPITAL_COMMUNITY)
Admission: RE | Admit: 2016-06-10 | Discharge: 2016-06-10 | Disposition: A | Discharge status: Home / Self Care | Payer: Medicare Other | Source: Ambulatory Visit | Attending: Internal Medicine | Admitting: Internal Medicine

## 2016-06-10 DIAGNOSIS — Z87891 Personal history of nicotine dependence: Secondary | ICD-10-CM | POA: Insufficient documentation

## 2016-06-10 DIAGNOSIS — B1920 Unspecified viral hepatitis C without hepatic coma: Secondary | ICD-10-CM | POA: Insufficient documentation

## 2016-06-10 DIAGNOSIS — I10 Essential (primary) hypertension: Secondary | ICD-10-CM | POA: Insufficient documentation

## 2016-06-10 DIAGNOSIS — K2970 Gastritis, unspecified, without bleeding: Secondary | ICD-10-CM | POA: Diagnosis not present

## 2016-06-10 DIAGNOSIS — K7460 Unspecified cirrhosis of liver: Secondary | ICD-10-CM | POA: Insufficient documentation

## 2016-06-10 DIAGNOSIS — Z96651 Presence of right artificial knee joint: Secondary | ICD-10-CM | POA: Diagnosis not present

## 2016-06-10 DIAGNOSIS — M170 Bilateral primary osteoarthritis of knee: Secondary | ICD-10-CM | POA: Insufficient documentation

## 2016-06-10 DIAGNOSIS — K319 Disease of stomach and duodenum, unspecified: Secondary | ICD-10-CM | POA: Diagnosis not present

## 2016-06-10 DIAGNOSIS — C220 Liver cell carcinoma: Secondary | ICD-10-CM | POA: Diagnosis not present

## 2016-06-10 DIAGNOSIS — K2960 Other gastritis without bleeding: Secondary | ICD-10-CM | POA: Diagnosis not present

## 2016-06-10 DIAGNOSIS — K219 Gastro-esophageal reflux disease without esophagitis: Secondary | ICD-10-CM | POA: Insufficient documentation

## 2016-06-10 DIAGNOSIS — Z09 Encounter for follow-up examination after completed treatment for conditions other than malignant neoplasm: Secondary | ICD-10-CM | POA: Insufficient documentation

## 2016-06-10 HISTORY — PX: ESOPHAGOGASTRODUODENOSCOPY (EGD) WITH PROPOFOL: SHX5813

## 2016-06-10 SURGERY — ESOPHAGOGASTRODUODENOSCOPY (EGD) WITH PROPOFOL
Anesthesia: Monitor Anesthesia Care

## 2016-06-10 MED ORDER — SODIUM CHLORIDE 0.9 % IV SOLN: INTRAVENOUS | Status: DC

## 2016-06-10 MED ORDER — LIDOCAINE HCL (CARDIAC) 20 MG/ML IV SOLN
INTRAVENOUS | Status: DC | PRN
  Administered 2016-06-10: 50 mg via INTRAVENOUS

## 2016-06-10 MED ORDER — PROPOFOL 10 MG/ML IV BOLUS
INTRAVENOUS | Status: AC
  Filled 2016-06-10: qty 40

## 2016-06-10 MED ORDER — PROPOFOL 10 MG/ML IV BOLUS
INTRAVENOUS | Status: DC | PRN
  Administered 2016-06-10 (×2): 20 mg via INTRAVENOUS

## 2016-06-10 MED ORDER — LACTATED RINGERS IV SOLN
INTRAVENOUS | Status: DC
  Administered 2016-06-10: 1000 mL via INTRAVENOUS

## 2016-06-10 MED ORDER — LIDOCAINE HCL (CARDIAC) 20 MG/ML IV SOLN
INTRAVENOUS | Status: AC
  Filled 2016-06-10: qty 5

## 2016-06-10 MED ORDER — PROPOFOL 500 MG/50ML IV EMUL
INTRAVENOUS | Status: DC | PRN
  Administered 2016-06-10: 140 ug/kg/min via INTRAVENOUS

## 2016-06-10 SURGICAL SUPPLY — 15 items

## 2016-06-10 NOTE — Anesthesia Postprocedure Evaluation (Signed)
Anesthesia Post Note  Patient: VENA CAPELLO  Procedure(s) Performed: Procedure(s) (LRB): ESOPHAGOGASTRODUODENOSCOPY (EGD) WITH PROPOFOL (N/A)  Patient location during evaluation: PACU Anesthesia Type: MAC Level of consciousness: awake and alert Pain management: pain level controlled Vital Signs Assessment: post-procedure vital signs reviewed and stable Respiratory status: spontaneous breathing, nonlabored ventilation, respiratory function stable and patient connected to nasal cannula oxygen Cardiovascular status: stable and blood pressure returned to baseline Anesthetic complications: no    Last Vitals:  Filed Vitals:   06/10/16 1030 06/10/16 1040  BP: 90/50 100/47  Pulse: 77 73  Resp: 14 11    Last Pain: There were no vitals filed for this visit.               George West

## 2016-06-10 NOTE — Discharge Instructions (Addendum)
YOU HAD AN ENDOSCOPIC PROCEDURE TODAY: Refer to the procedure report that was given to you for any specific questions about what was found during the examination.  If the procedure report does not answer your questions, please call your gastroenterologist to clarify.  YOU SHOULD EXPECT: Some feelings of bloating in the abdomen. Passage of more gas than usual.  Walking can help get rid of the air that was put into your GI tract during the procedure and reduce the bloating. If you had a lower endoscopy (such as a colonoscopy or flexible sigmoidoscopy) you may notice spotting of blood in your stool or on the toilet paper.   DIET: Your first meal following the procedure should be a light meal and then it is ok to progress to your normal diet.  A half-sandwich or bowl of soup is an example of a good first meal.  Heavy or fried foods are harder to digest and may make you feel nasueas or bloated.  Drink plenty of fluids but you should avoid alcoholic beverages for 24 hours.  ACTIVITY: Your care partner should take you home directly after the procedure.  You should plan to take it easy, moving slowly for the rest of the day.  You can resume normal activity the day after the procedure however you should NOT DRIVE or use heavy machinery for 24 hours (because of the sedation medicines used during the test).    SYMPTOMS TO REPORT IMMEDIATELY  A gastroenterologist can be reached at any hour.  Please call your doctor's office for any of the following symptoms:     Following upper endoscopy (EGD, EUS, ERCP)  Vomiting of blood or coffee ground material  New, significant abdominal pain  New, significant chest pain or pain under the shoulder blades  Painful or persistently difficult swallowing  New shortness of breath  Black, tarry-looking stools  FOLLOW UP: If any biopsies were taken you will be contacted by phone or by letter within the next 1-3 weeks.  Call your gastroenterologist if you have not heard about  the biopsies in 3 weeks.  Please also call your gastroenterologist's office with any specific questions about appointments or follow up tests.  Moderate Conscious Sedation, Adult, Care After Refer to this sheet in the next few weeks. These instructions provide you with information on caring for yourself after your procedure. Your health care provider may also give you more specific instructions. Your treatment has been planned according to current medical practices, but problems sometimes occur. Call your health care provider if you have any problems or questions after your procedure. WHAT TO EXPECT AFTER THE PROCEDURE  After your procedure:  You may feel sleepy, clumsy, and have poor balance for several hours.  Vomiting may occur if you eat too soon after the procedure. HOME CARE INSTRUCTIONS  Do not participate in any activities where you could become injured for at least 24 hours. Do not:  Drive.  Swim.  Ride a bicycle.  Operate heavy machinery.  Cook.  Use power tools.  Climb ladders.  Work from a high place.  Do not make important decisions or sign legal documents until you are improved.  If you vomit, drink water, juice, or soup when you can drink without vomiting. Make sure you have little or no nausea before eating solid foods.  Only take over-the-counter or prescription medicines for pain, discomfort, or fever as directed by your health care provider.  Make sure you and your family fully understand everything about the medicines  given to you, including what side effects may occur.  You should not drink alcohol, take sleeping pills, or take medicines that cause drowsiness for at least 24 hours.  If you smoke, do not smoke without supervision.  If you are feeling better, you may resume normal activities 24 hours after you were sedated.  Keep all appointments with your health care provider. SEEK MEDICAL CARE IF:  Your skin is pale or bluish in color.  You  continue to feel nauseous or vomit.  Your pain is getting worse and is not helped by medicine.  You have bleeding or swelling.  You are still sleepy or feeling clumsy after 24 hours. SEEK IMMEDIATE MEDICAL CARE IF:  You develop a rash.  You have difficulty breathing.  You develop any type of allergic problem.  You have a fever. MAKE SURE YOU:  Understand these instructions.  Will watch your condition.  Will get help right away if you are not doing well or get worse.   This information is not intended to replace advice given to you by your health care provider. Make sure you discuss any questions you have with your health care provider.   Document Released: 10/05/2013 Document Revised: 01/05/2015 Document Reviewed: 10/05/2013 Elsevier Interactive Patient Education [employer].

## 2016-06-10 NOTE — Anesthesia Preprocedure Evaluation (Signed)
Anesthesia Evaluation  Patient identified by MRN, date of birth, ID band Patient awake    Reviewed: Allergy & Precautions, NPO status , Patient's Chart, lab work & pertinent test results  Airway Mallampati: I  TM Distance: >3 FB Neck ROM: Full    Dental   Pulmonary former smoker,    Pulmonary exam normal        Cardiovascular hypertension, Pt. on medications Normal cardiovascular exam     Neuro/Psych    GI/Hepatic GERD  Controlled and Medicated,(+) Hepatitis -, C  Endo/Other    Renal/GU      Musculoskeletal   Abdominal   Peds  Hematology   Anesthesia Other Findings   Reproductive/Obstetrics                             Anesthesia Physical Anesthesia Plan  ASA: III  Anesthesia Plan: MAC   Post-op Pain Management:    Induction: Intravenous  Airway Management Planned: Simple Face Mask  Additional Equipment:   Intra-op Plan:   Post-operative Plan:   Informed Consent: I have reviewed the patients History and Physical, chart, labs and discussed the procedure including the risks, benefits and alternatives for the proposed anesthesia with the patient or authorized representative who has indicated his/her understanding and acceptance.     Plan Discussed with: CRNA and Surgeon  Anesthesia Plan Comments:         Anesthesia Quick Evaluation

## 2016-06-10 NOTE — Transfer of Care (Signed)
Immediate Anesthesia Transfer of Care Note  Patient: Samantha [name]  Procedure(s) Performed: Procedure(s): ESOPHAGOGASTRODUODENOSCOPY (EGD) WITH PROPOFOL (N/A)  Patient Location: Endoscopy Unit  Anesthesia Type:MAC  Level of Consciousness: awake, alert  and oriented  Airway & Oxygen Therapy: Patient Spontanous Breathing and Patient connected to nasal cannula oxygen  Post-op Assessment: Report given to RN and Post -op Vital signs reviewed and stable  Post vital signs: Reviewed and stable  Last Vitals: There were no vitals filed for this visit.  Last Pain: There were no vitals filed for this visit.       Complications: No apparent anesthesia complications

## 2016-06-10 NOTE — H&P (Signed)
HPI: Samantha [name] is a 65 yo female with PMH of Hep C cirrhosis with [place] status post treatment with Harvoni and TACE via IR for [facility] here for variceal screening endoscopy. She reports she's been feeling well. Denies bleeding or melena. No abdominal pain. Had very remote endoscopy but none recently.  See below for recent labs.  Hgb normal.  Plts and INR also normal.  Past Medical History  Diagnosis Date  . Hypertension   . Elevated glucose     no longer an issue 03-18-16  . LBP (low back pain)   . Obesity   . Osteoarthritis     B knees  . Hyperlipidemia 2011  . GERD (gastroesophageal reflux disease)   . Allergy     seasonal  . H/O seasonal allergies   . Cancer [facility]) 2017    liver cancer  . Hepatitis C     hepatitis c completed march 2017    Past Surgical History  Procedure Laterality Date  . Right knee replacement  2007  . Left shoulder arthroplasty    . Total knee replacement l  03-13-08  . Cholecystectomy    . Tubal ligation    . Joint replacement    . Colonoscopy N/A 01/09/2015    Procedure: COLONOSCOPY;  Surgeon: Jerene Bears, MD;  Location: [facility];  Service: Gastroenterology;  Laterality: N/A;     (Not in an outpatient encounter)  Allergies  Allergen Reactions  . Benazepril Hcl Cough  . Codeine Itching    Family History  Problem Relation Age of Onset  . Diabetes Other   . Hypertension Other   . Hypertension Mother   . Ovarian cancer Mother   . Hypertension Father   . Cancer Father 18    prostate cancer   . Colon cancer Neg Hx   . Esophageal cancer Neg Hx   . Rectal cancer Neg Hx   . Stomach cancer Neg Hx     Social History  Substance Use Topics  . Smoking status: Former Smoker -- 0.25 packs/day for 20 years    Quit date: 12/30/2007  . Smokeless tobacco: Never Used  . Alcohol Use: No     Comment: 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, quit drinking alcohol in 10/2015     ROS: As per history of present illness, otherwise  negative  There were no vitals taken for this visit. Constitutional: Well-developed and well-nourished. No distress. HEENT: anicteric, op clear CV: RRR, no mrg Pulm: CTA b/l Abd: soft, obese, NT/ND, +BS throughout Ext: no c/c/e Neuro: nonfocal   RELEVANT LABS AND IMAGING: CBC    Component Value Date/Time   WBC 4.7 06/02/2016 1405   RBC 4.82 06/02/2016 1405   HGB 12.6 06/02/2016 1405   HCT 40.5 06/02/2016 1405   PLT 202 06/02/2016 1405   MCV 84.0 06/02/2016 1405   MCH 26.1* 06/02/2016 1405   MCHC 31.1* 06/02/2016 1405   RDW 14.2 06/02/2016 1405   LYMPHSABS 1.4 03/18/2016 1200   MONOABS 0.5 03/18/2016 1200   EOSABS 0.1 03/18/2016 1200   BASOSABS 0.0 03/18/2016 1200    CMP     Component Value Date/Time   NA 140 06/02/2016 1405   K 4.1 06/02/2016 1405   CL 105 06/02/2016 1405   CO2 26 06/02/2016 1405   GLUCOSE 93 06/02/2016 1405   BUN 15 06/02/2016 1405   CREATININE 0.81 06/02/2016 1405   CREATININE 0.84 03/22/2016 0340   CALCIUM 9.0 06/02/2016 1405   PROT  6.9 06/02/2016 1405   ALBUMIN 3.8 06/02/2016 1405   AST 22 06/02/2016 1405   ALT 17 06/02/2016 1405   ALKPHOS 98 06/02/2016 1405   BILITOT 0.4 06/02/2016 1405   GFRNONAA 72 04/07/2016 1308   GFRNONAA >60 03/22/2016 0340   GFRAA 83 04/07/2016 1308   GFRAA >60 03/22/2016 0340   Lab Results  Component Value Date   INR 1.03 06/02/2016   INR 1.07 04/07/2016   INR 1.15 03/21/2016    ASSESSMENT/PLAN: 65 yo female with PMH of Hep C cirrhosis with [place] status post treatment with Harvoni and TACE via IR for [facility] here for variceal screening endoscopy.  1. 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 Ample time for discussion and questions allowed. The patient understood, was satisfied, and agreed to proceed. We also discussed the possibility of variceal band  ligation should larger high-risk varices be discovered today.

## 2016-06-10 NOTE — Op Note (Signed)
[facility] Patient Name: Samantha [name] Procedure Date: 06/10/2016 MRN: [id number] Attending MD: Jerene Bears , MD Date of Birth: 1951-10-21 CSN: [id number] Age: 65 Admit Type: Outpatient Procedure:                Upper GI endoscopy Indications:              To evaluate esophageal varices in patient with                            suspected portal hypertension, Hepatitis C                            cirrhosis rule out esophageal varices Providers:                Lajuan Lines. Hilarie Fredrickson, MD, Hilma Favors, RN, Zenon Mayo,                            RN, William Dalton, Technician, Danley Danker, CRNA Referring MD:             Thayer Headings Medicines:                Monitored Anesthesia Care Complications:            No immediate complications. Estimated Blood Loss:     Estimated blood loss was minimal. Procedure:                Pre-Anesthesia Assessment:                           - Prior to the procedure, a History and Physical                            was performed, and patient medications and                            allergies were reviewed. The patient's tolerance of                            previous anesthesia was also reviewed. The risks                            and benefits of the procedure and the sedation                            options and risks were discussed with the patient.                            All questions were answered, and informed consent                            was obtained. Prior Anticoagulants: The patient has                            taken no previous anticoagulant or antiplatelet  agents. ASA Grade Assessment: III - A patient with                            severe systemic disease. After reviewing the risks                            and benefits, the patient was deemed in                            satisfactory condition to undergo the procedure.                           After obtaining informed  consent, the endoscope was                            passed under direct vision. Throughout the                            procedure, the patient's blood pressure, pulse, and                            oxygen saturations were monitored continuously. The                            [id number]) scope was introduced through the                            mouth, and advanced to the second part of duodenum.                            The upper GI endoscopy was accomplished without                            difficulty. The patient tolerated the procedure                            well. Findings:      The examined esophagus was normal.      There is no endoscopic evidence of varices in the entire esophagus.      Patchy mild inflammation characterized by erosions and erythema was       found in the gastric antrum. Biopsies were taken with a cold forceps for       histology and Helicobacter pylori testing.      The exam of the stomach was otherwise normal.      The cardia and gastric fundus were normal on retroflexion.      The examined duodenum was normal. Impression:               - Normal esophagus. No evidence of esophageal or                            gastric varices.                           - Gastritis. Biopsied.                           -  Normal examined duodenum. Moderate Sedation:      N/A Recommendation:           - Patient has a contact number available for                            emergencies. The signs and symptoms of potential                            delayed complications were discussed with the                            patient. Return to normal activities tomorrow.                            Written discharge instructions were provided to the                            patient.                           - Resume previous diet.                           - Continue present medications.                           - Await pathology results.                            - Repeat upper endoscopy in 3 years for screening                            purposes, unless there is decompensation in liver                            disease which would warrant earlier repeat                            screening. Procedure Code(s):        --- Professional ---                           [id number], Esophagogastroduodenoscopy, flexible,                            transoral; with biopsy, single or multiple Diagnosis Code(s):        --- Professional ---                           K29.70, Gastritis, unspecified, without bleeding                           I85.00, Esophageal varices without bleeding                           K74.60, Unspecified cirrhosis of liver CPT copyright 2016 American Medical Association. All rights  reserved. The codes documented in this report are preliminary and upon coder review may  be revised to meet current compliance requirements. Jerene Bears, MD 06/10/2016 10:17:06 AM This report has been signed electronically. Number of Addenda: 0

## 2016-06-11 ENCOUNTER — Ambulatory Visit: Payer: Medicare Other

## 2016-06-11 ENCOUNTER — Encounter (HOSPITAL_COMMUNITY): Payer: Self-pay | Admitting: Internal Medicine

## 2016-06-11 ENCOUNTER — Encounter: Payer: Self-pay | Admitting: Internal Medicine

## 2016-06-11 ENCOUNTER — Ambulatory Visit (HOSPITAL_COMMUNITY)
Admission: RE | Admit: 2016-06-11 | Discharge: 2016-06-11 | Disposition: A | Discharge status: Home / Self Care | Payer: Medicare Other | Source: Ambulatory Visit | Attending: Interventional Radiology | Admitting: Interventional Radiology

## 2016-06-11 ENCOUNTER — Other Ambulatory Visit (HOSPITAL_COMMUNITY): Payer: Self-pay | Admitting: Interventional Radiology

## 2016-06-11 DIAGNOSIS — C220 Liver cell carcinoma: Secondary | ICD-10-CM

## 2016-06-30 DIAGNOSIS — C220 Liver cell carcinoma: Secondary | ICD-10-CM | POA: Diagnosis not present

## 2016-06-30 DIAGNOSIS — K7460 Unspecified cirrhosis of liver: Secondary | ICD-10-CM | POA: Diagnosis not present

## 2016-06-30 DIAGNOSIS — B182 Chronic viral hepatitis C: Secondary | ICD-10-CM | POA: Diagnosis not present

## 2016-07-02 ENCOUNTER — Other Ambulatory Visit: Payer: Self-pay | Admitting: Nurse Practitioner

## 2016-07-03 ENCOUNTER — Other Ambulatory Visit: Payer: Self-pay | Admitting: Nurse Practitioner

## 2016-07-03 ENCOUNTER — Other Ambulatory Visit (HOSPITAL_COMMUNITY): Payer: Self-pay | Admitting: Interventional Radiology

## 2016-07-03 ENCOUNTER — Ambulatory Visit (HOSPITAL_COMMUNITY)
Admission: RE | Admit: 2016-07-03 | Discharge: 2016-07-03 | Disposition: A | Discharge status: Home / Self Care | Payer: Medicare Other | Source: Ambulatory Visit | Attending: Interventional Radiology | Admitting: Interventional Radiology

## 2016-07-03 ENCOUNTER — Ambulatory Visit
Admission: RE | Admit: 2016-07-03 | Discharge: 2016-07-03 | Disposition: A | Discharge status: Home / Self Care | Payer: Medicare Other | Source: Ambulatory Visit | Attending: Interventional Radiology | Admitting: Interventional Radiology

## 2016-07-03 DIAGNOSIS — K760 Fatty (change of) liver, not elsewhere classified: Secondary | ICD-10-CM | POA: Diagnosis not present

## 2016-07-03 DIAGNOSIS — Z9889 Other specified postprocedural states: Secondary | ICD-10-CM | POA: Insufficient documentation

## 2016-07-03 DIAGNOSIS — C220 Liver cell carcinoma: Secondary | ICD-10-CM | POA: Diagnosis not present

## 2016-07-03 DIAGNOSIS — R918 Other nonspecific abnormal finding of lung field: Secondary | ICD-10-CM

## 2016-07-03 HISTORY — PX: IR GENERIC HISTORICAL: IMG1180011

## 2016-07-03 MED ORDER — GADOXETATE DISODIUM 0.25 MMOL/ML IV SOLN
9.0000 mL | Freq: Once | INTRAVENOUS | Status: AC | PRN
  Administered 2016-07-03: 9 mL via INTRAVENOUS

## 2016-07-03 MED ORDER — GADOXETATE DISODIUM 0.25 MMOL/ML IV SOLN: 9.0000 mL | Freq: Once | INTRAVENOUS | Status: DC | PRN

## 2016-07-03 NOTE — Progress Notes (Signed)
Chief Complaint: Patient was seen in consultation today for  Chief Complaint  Patient presents with  . Follow-up    s/p Chem Embo & MWA of Hepatocellular Carcinoma of Right Hepatic Lobe   at the request of Maisie Hauser  Referring Physician(s): Ashyla Luth  History of Present Illness: Samantha [name] is a 65 y.o. 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 She underwent conventional lipiodol transarterial chemo embolization on 2/23 and then subsequent CT guided thermal ablation of this lesion on 03/21/2016.  She presents today for 3 month follow up and review of labs.  She is doing very well. Appetite and energy levels remain excellent. She is fully functional. She does report occasional mild gnawing pain in the right upper quadrant which occurs daily but lasts for only a short while. Her pain is significant only improved with over-the-counter ibuprofen. No unintentional weight loss, fever, chills or malaise.  Past Medical History  Diagnosis Date  . Hypertension   . Elevated glucose     no longer an issue 03-18-16  . LBP (low back pain)   . Obesity   . Osteoarthritis     B knees  . Hyperlipidemia 2011  . GERD (gastroesophageal reflux disease)   . Allergy     seasonal  . H/O seasonal allergies   . Cancer [facility]) 2017    liver cancer  . Hepatitis C     hepatitis c completed march 2017    Past Surgical History  Procedure Laterality Date  . Right knee replacement  2007  . Left shoulder arthroplasty    . Total knee replacement l  03-13-08  . Cholecystectomy    . Tubal ligation    . Joint replacement    . Colonoscopy N/A 01/09/2015    Procedure: COLONOSCOPY;  Surgeon: Jerene Bears, MD;  Location: [facility];  Service: Gastroenterology;  Laterality: N/A;  . Esophagogastroduodenoscopy (egd) with propofol N/A 06/10/2016    Procedure: ESOPHAGOGASTRODUODENOSCOPY  (EGD) WITH PROPOFOL;  Surgeon: Jerene Bears, MD;  Location: [facility];  Service: Gastroenterology;  Laterality: N/A;    Allergies: Benazepril hcl and Codeine  Medications: Prior to Admission medications   Medication Sig Start Date End Date Taking? Authorizing Provider  amLODipine (NORVASC) 5 MG tablet Take 1 tablet (5 mg total) by mouth daily. 04/14/16  Yes Evie Lacks Plotnikov, MD  aspirin EC 81 MG tablet Take 81 mg by mouth daily.   Yes Historical Provider, MD  BLACK COHOSH PO Take 1 tablet by mouth daily.   Yes Historical Provider, MD  Cholecalciferol (EQL VITAMIN D3) 1000 UNITS tablet Take 1,000 Units by mouth daily.    Yes Historical Provider, MD  fluticasone (FLONASE) 50 MCG/ACT nasal spray Place 2 sprays into the nose daily. Patient taking differently: Place 2 sprays into the nose daily as needed for allergies.  03/23/12  Yes Evie Lacks Plotnikov, MD  furosemide (LASIX) 20 MG tablet Take 1 tablet (20 mg total) by mouth daily. 04/14/16  Yes Evie Lacks Plotnikov, MD  ibuprofen (ADVIL,MOTRIN) 400 MG tablet Take 1 tablet (400 mg total) by mouth 2 (two) times daily as needed for moderate pain (pc). Patient taking differently: Take 400 mg by mouth 2 (two) times daily as needed (For pain.).  04/14/16  Yes Evie Lacks Plotnikov, MD  loratadine (CLARITIN) 10 MG tablet Take 1 tablet (10 mg total) by mouth daily. 04/14/16  Yes Cassandria Anger, MD  losartan (COZAAR) 100 MG tablet Take 1 tablet (100 mg total) by mouth daily. 04/14/16  Yes Cassandria Anger, MD  Multiple Vitamin (MULTIVITAMIN WITH MINERALS) TABS tablet Take 1 tablet by mouth daily.    Yes Historical Provider, MD  naproxen sodium (ALEVE) 220 MG tablet Take 220 mg by mouth every 8 (eight) hours as needed (For pain.).   Yes Historical Provider, MD  pantoprazole (PROTONIX) 40 MG tablet Take 1 tablet (40 mg total) by mouth daily. 04/14/16  Yes Evie Lacks Plotnikov, MD  potassium chloride SA (K-DUR,KLOR-CON) 20 MEQ tablet Take 1 tablet (20 mEq  total) by mouth daily. 04/14/16  Yes Evie Lacks Plotnikov, MD  ondansetron (ZOFRAN) 8 MG tablet Take 8 mg by mouth every 8 (eight) hours as needed for nausea or vomiting. Reported on 07/03/2016 02/22/16   Historical Provider, MD  oxyCODONE (OXY IR/ROXICODONE) 5 MG immediate release tablet Take 5 mg by mouth every 6 (six) hours as needed (For pain.). Reported on 07/03/2016 03/22/16   Historical Provider, MD     Family History  Problem Relation Age of Onset  . Diabetes Other   . Hypertension Other   . Hypertension Mother   . Ovarian cancer Mother   . Hypertension Father   . Cancer Father 67    prostate cancer   . Colon cancer Neg Hx   . Esophageal cancer Neg Hx   . Rectal cancer Neg Hx   . Stomach cancer Neg Hx     Social History   Social History  . Marital Status: Single    Spouse Name: N/A  . Number of Children: 4  . Years of Education: N/A   Social History Main Topics  . Smoking status: Former Smoker -- 0.25 packs/day for 20 years    Quit date: 12/30/2007  . Smokeless tobacco: Never Used  . 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 Drug Use: No  . Sexual Activity: Not Currently   Other Topics Concern  . Not on file   Social History Narrative   Regular exercise: walk a few times a week   Caffeine use: none   Single, raising her young great granddaughter and watches grandchildren after school   Has a room mate to help prn   Retired from nursing tech--worked at [employer]   Independent/drives    ECOG Status: 0 - Asymptomatic  Review of Systems: A 12 point ROS discussed and pertinent positives are indicated in the HPI above.  All other systems are negative.  Review of Systems  Vital Signs: BP 127/74 mmHg  Pulse 93  Temp(Src) 98.1 F (36.7 C) (Oral)  Resp 15  Ht 5\' 3"  (1.6 m)  Wt 290 lb (131.543 kg)  BMI 51.38 kg/m2  SpO2 98%  Physical Exam  Constitutional: She is oriented to  person, place, and time. She appears well-developed and well-nourished. No distress.  HENT:  Head: Normocephalic and atraumatic.  Eyes: No scleral icterus.  Cardiovascular: Normal rate and regular rhythm.   Pulmonary/Chest: Effort normal.  Neurological: She is alert and oriented to person, place, and time.  Skin: Skin is warm and dry.  Psychiatric: She has a normal mood and affect. Her behavior is normal.  Nursing note and vitals reviewed.   Imaging: Mr Abdomen W Wo Contrast  07/03/2016  CLINICAL DATA:  65 year old female with history of hepatocellular carcinoma status post embolization and thermal  ablation (thermal ablation completed on 03/21/2016). Three-month follow-up study. EXAM: MRI ABDOMEN WITHOUT AND WITH CONTRAST TECHNIQUE: Multiplanar multisequence MR imaging of the abdomen was performed both before and after the administration of intravenous contrast. CONTRAST:  9 mL of MultiHance COMPARISON:  MRI of the abdomen with and without IV gadolinium 12/20/2015. FINDINGS: Lower chest:  Unremarkable. Hepatobiliary: Very subtle nodular contour of the liver, suggesting mild cirrhosis. There is mild diffuse loss of signal intensity throughout the hepatic parenchyma on out of phase dual echo images, compatible with a background of mild hepatic steatosis. In the central aspect of the liver predominantly in segment 8 there is a well-defined 2.9 x 2.3 cm area of increased T1 signal intensity (slightly heterogeneous in appearance) which is predominantly T2 isointense (although there is some irregular T2 hyperintensity around the post ablation area). This corresponds to the embolization/ablation defect. On post gadolinium imaging there is some limitation related to irregular breath holding by the patient, which slightly limits the subtraction images. However, with this limitation in mind there is no definite hypervascular enhancement in the post ablation area to suggest residual/recurrent disease. No new  hepatic lesions are noted elsewhere in the liver. No intra or extrahepatic biliary ductal dilatation. Status post cholecystectomy. Pancreas: No pancreatic mass. No pancreatic ductal dilatation. No pancreatic or peripancreatic fluid or inflammatory changes. Spleen: Unremarkable. Adrenals/Urinary Tract: Bilateral adrenal glands and bilateral kidneys are normal in appearance. No hydroureteronephrosis in the visualized abdomen. Stomach/Bowel: Visualized portions are unremarkable. Vascular/Lymphatic: No aneurysm identified in the visualized abdominal vasculature. No lymphadenopathy noted in the abdomen. Other: No significant volume of ascites in the visualized peritoneal cavity. Musculoskeletal: No aggressive osseous lesions are identified the visualized portions of the skeleton. IMPRESSION: 1. Postprocedural changes of prior embolization therapy and thermal ablation for lesion centered in segment 8 of the liver, as above. No definite findings to suggest residual/recurrent disease on today's study. 2. Hepatic steatosis. Electronically Signed   By: Vinnie Langton M.D.   On: 07/03/2016 13:07   Mr Attempted Daymon Larsen Report  06/11/2016  This examination belongs to an outside facility and is stored here for comparison purposes only.  Contact the originating outside institution for any associated report or interpretation.   Labs:  CBC:  Recent Labs  03/21/16 1134 03/22/16 0340 04/07/16 1308 06/02/16 1405  WBC 7.2 5.0 5.3 4.7  HGB 11.6* 11.2* 13.1 12.6  HCT 36.7 35.2* 41.1 40.5  PLT 183 170 264 202    COAGS:  Recent Labs  02/01/16 1109  03/18/16 1200 03/21/16 1134 04/07/16 1308 06/02/16 1405  INR 1.05  < > 1.05 1.15 1.07 1.03  APTT 29  --  31 29  --   --   < > = values in this interval not displayed.  BMP:  Recent Labs  03/18/16 1200 03/21/16 1134 03/22/16 0340 04/07/16 1308 06/02/16 1405  NA 143 141 142 142 140  K 4.4 3.6 3.7 4.2 4.1  CL 109 108 108 107 105  CO2 26 24 26 21 26     GLUCOSE 95 128* 127* 95 93  BUN 16 16 13 16 15   CALCIUM 9.4 8.7* 8.3* 9.5 9.0  CREATININE 0.80 0.78 0.84 0.85 0.81  GFRNONAA >60 >60 >60 72  --   GFRAA >60 >60 >60 83  --     LIVER FUNCTION TESTS:  Recent Labs  03/21/16 1134 03/22/16 0340 04/07/16 1308 06/02/16 1405  BILITOT 1.3* 0.6 0.4 0.4  AST 71* 136* 37* 22  ALT 54 91* 40* 17  ALKPHOS 100 97 110 98  PROT 7.1 6.0* 7.4 6.9  ALBUMIN 3.4* 2.9* 3.9 3.8    TUMOR MARKERS:  Recent Labs  12/20/15 1143 06/02/16 1405  AFPTM 21.7* 8.6*    Assessment and Plan:  Doing extremely well status post combination transarterial chemoembolization and percutaneous thermal ablation of a nearly 5 cm biopsy-proven hepatocellular carcinoma.  Her follow-up imaging today demonstrates a remarkable results with significant involution of the initial tumor and no evidence of residual or recurrent disease at this time.  Her AFP has decreased and her liver enzymes are all normal. She is nearly asymptomatic with only occasional right upper quadrant discomfort.  1.) Repeat MRI of the abdomen with contrast in 3 months followed by a return clinic visit.       Electronically Signed: [name] 07/03/2016, 3:04 PM   I spent a total of    15 Minutes in face to face in clinical consultation, greater than 50% of which was counseling/coordinating care for Hepatocellular carcinoma.

## 2016-07-10 ENCOUNTER — Encounter: Payer: Self-pay | Admitting: Internal Medicine

## 2016-07-10 ENCOUNTER — Ambulatory Visit (INDEPENDENT_AMBULATORY_CARE_PROVIDER_SITE_OTHER): Payer: Medicare Other | Admitting: Internal Medicine

## 2016-07-10 VITALS — BP 103/58 | HR 111 | Temp 97.9°F | Ht 63.0 in | Wt 294.0 lb

## 2016-07-10 DIAGNOSIS — K7460 Unspecified cirrhosis of liver: Secondary | ICD-10-CM | POA: Diagnosis not present

## 2016-07-10 DIAGNOSIS — C220 Liver cell carcinoma: Secondary | ICD-10-CM | POA: Diagnosis not present

## 2016-07-10 DIAGNOSIS — B182 Chronic viral hepatitis C: Secondary | ICD-10-CM

## 2016-07-10 NOTE — Assessment & Plan Note (Signed)
Now treated and hopefully will continue to do well.  Appreciate Dr. Katrinka Blazing management.

## 2016-07-10 NOTE — Assessment & Plan Note (Addendum)
Will check SVR 12 today (4 months) and if ok considered cured.  I will not have her follow up with me and defer further management to [facility].

## 2016-07-10 NOTE — Progress Notes (Signed)
   Subjective:    Patient ID: Samantha [name], female    DOB: June 05, 1951, 65 y.o.   MRN: [id number]  HPI Here for follow up of her HCV and [place].     She has genotype 1b, viral load of 1 million.  Hepatitis A and B immune, persistent transaminitis up to 400s.  IShe also started Harvoni and now completed therapy about 4 months ago.  I checked a viral load near the end of treatment to assure it was not still active and was undetectable.     Has HCC and has undergone ablation by Dr. Laurence Ferrari.  Had repeat MRI last week and no new growth.  Followed by Dr. Barry Dienes and no surgery planned.  Dr. Burr Medico of oncology and sees Dawn/Dr. Lurlean Leyden of [facility].  Has had EGD by Dr. Hilarie Fredrickson and no varices.     Review of Systems  Constitutional: Negative for fatigue and unexpected weight change.  Gastrointestinal: Negative for nausea and diarrhea.  Skin: Negative for rash.  Neurological: Negative for dizziness and light-headedness.       Objective:   Physical Exam  Constitutional: She appears well-developed and well-nourished. No distress.  Eyes: No scleral icterus.  Cardiovascular: Normal rate, regular rhythm and normal heart sounds.   No murmur heard. Pulmonary/Chest: Effort normal and breath sounds normal. No respiratory distress.  Skin: No rash noted.   Social History   Social History  . Marital Status: Single    Spouse Name: N/A  . Number of Children: 4  . Years of Education: N/A   Occupational History  . Not on file.   Social History Main Topics  . Smoking status: Former Smoker -- 0.25 packs/day for 20 years    Quit date: 12/30/2007  . Smokeless tobacco: Never Used  . 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 Drug Use: No  . Sexual Activity: Not Currently   Other Topics Concern  . Not on file   Social History Narrative   Regular exercise: walk a few times a week   Caffeine use: none   Single,  raising her young great granddaughter and watches grandchildren after school   Has a room mate to help prn   Retired from nursing tech--worked at [employer]:

## 2016-07-10 NOTE — Assessment & Plan Note (Signed)
EGD done and appreciate Dr Hilarie Fredrickson seeing her.  Followed by [facility] and will defer further management to them.  I appreciate them seeing her.

## 2016-07-11 LAB — HEPATITIS C RNA QUANTITATIVE: HCV Quantitative: NOT DETECTED IU/mL (ref <15)

## 2016-07-14 ENCOUNTER — Telehealth: Payer: Self-pay | Admitting: *Deleted

## 2016-07-14 NOTE — Telephone Encounter (Signed)
-----   Message from Thayer Headings, MD sent at 07/14/2016  8:46 AM EDT ----- Please let her know her final HCV viral load remains gone and she is considered cured. Thanks!

## 2016-07-14 NOTE — Telephone Encounter (Signed)
Per Dr Linus Salmons called the patient to give her results of her recent HCV viral load and had to leave a message for her to call the office. Will advise her when she calls back.

## 2016-07-16 ENCOUNTER — Ambulatory Visit
Admission: RE | Admit: 2016-07-16 | Discharge: 2016-07-16 | Disposition: A | Discharge status: Home / Self Care | Payer: Medicare Other | Source: Ambulatory Visit | Attending: Nurse Practitioner | Admitting: Nurse Practitioner

## 2016-07-16 DIAGNOSIS — R918 Other nonspecific abnormal finding of lung field: Secondary | ICD-10-CM

## 2016-07-16 DIAGNOSIS — R911 Solitary pulmonary nodule: Secondary | ICD-10-CM | POA: Diagnosis not present

## 2016-09-17 ENCOUNTER — Ambulatory Visit (INDEPENDENT_AMBULATORY_CARE_PROVIDER_SITE_OTHER): Payer: Medicare Other | Admitting: Internal Medicine

## 2016-09-17 ENCOUNTER — Other Ambulatory Visit (INDEPENDENT_AMBULATORY_CARE_PROVIDER_SITE_OTHER): Payer: Medicare Other

## 2016-09-17 ENCOUNTER — Encounter: Payer: Self-pay | Admitting: Internal Medicine

## 2016-09-17 ENCOUNTER — Ambulatory Visit (INDEPENDENT_AMBULATORY_CARE_PROVIDER_SITE_OTHER)
Admission: RE | Admit: 2016-09-17 | Discharge: 2016-09-17 | Disposition: A | Discharge status: Home / Self Care | Payer: Medicare Other | Source: Ambulatory Visit | Attending: Internal Medicine | Admitting: Internal Medicine

## 2016-09-17 VITALS — BP 128/78 | HR 88 | Wt 296.0 lb

## 2016-09-17 DIAGNOSIS — M5440 Lumbago with sciatica, unspecified side: Secondary | ICD-10-CM | POA: Diagnosis not present

## 2016-09-17 DIAGNOSIS — C220 Liver cell carcinoma: Secondary | ICD-10-CM

## 2016-09-17 DIAGNOSIS — I10 Essential (primary) hypertension: Secondary | ICD-10-CM

## 2016-09-17 DIAGNOSIS — K769 Liver disease, unspecified: Secondary | ICD-10-CM

## 2016-09-17 DIAGNOSIS — G8929 Other chronic pain: Secondary | ICD-10-CM

## 2016-09-17 DIAGNOSIS — M47816 Spondylosis without myelopathy or radiculopathy, lumbar region: Secondary | ICD-10-CM | POA: Diagnosis not present

## 2016-09-17 DIAGNOSIS — M542 Cervicalgia: Secondary | ICD-10-CM

## 2016-09-17 DIAGNOSIS — K7689 Other specified diseases of liver: Secondary | ICD-10-CM | POA: Diagnosis not present

## 2016-09-17 DIAGNOSIS — R202 Paresthesia of skin: Secondary | ICD-10-CM

## 2016-09-17 DIAGNOSIS — Z23 Encounter for immunization: Secondary | ICD-10-CM

## 2016-09-17 DIAGNOSIS — M5032 Other cervical disc degeneration, mid-cervical region, unspecified level: Secondary | ICD-10-CM | POA: Diagnosis not present

## 2016-09-17 LAB — CBC WITH DIFFERENTIAL/PLATELET
BASOS PCT: 0.6 % (ref 0.0–3.0)
Basophils Absolute: 0 10*3/uL (ref 0.0–0.1)
EOS PCT: 2.8 % (ref 0.0–5.0)
Eosinophils Absolute: 0.1 10*3/uL (ref 0.0–0.7)
HCT: 42 % (ref 36.0–46.0)
HEMOGLOBIN: 13.7 g/dL (ref 12.0–15.0)
Lymphocytes Relative: 37.2 % (ref 12.0–46.0)
Lymphs Abs: 1.8 10*3/uL (ref 0.7–4.0)
MCHC: 32.7 g/dL (ref 30.0–36.0)
MCV: 80.4 fl (ref 78.0–100.0)
MONO ABS: 0.6 10*3/uL (ref 0.1–1.0)
MONOS PCT: 12 % (ref 3.0–12.0)
Neutro Abs: 2.3 10*3/uL (ref 1.4–7.7)
Neutrophils Relative %: 47.4 % (ref 43.0–77.0)
Platelets: 209 10*3/uL (ref 150.0–400.0)
RBC: 5.22 Mil/uL — AB (ref 3.87–5.11)
RDW: 13.7 % (ref 11.5–15.5)
WBC: 4.9 10*3/uL (ref 4.0–10.5)

## 2016-09-17 LAB — BASIC METABOLIC PANEL
BUN: 17 mg/dL (ref 6–23)
CALCIUM: 9.4 mg/dL (ref 8.4–10.5)
CO2: 28 meq/L (ref 19–32)
CREATININE: 0.94 mg/dL (ref 0.40–1.20)
Chloride: 105 mEq/L (ref 96–112)
GFR: 76.4 mL/min (ref >60.00)
GLUCOSE: 102 mg/dL — AB (ref 70–99)
Potassium: 4.1 mEq/L (ref 3.5–5.1)
SODIUM: 140 meq/L (ref 135–145)

## 2016-09-17 LAB — HEPATIC FUNCTION PANEL
ALT: 16 U/L (ref 0–35)
AST: 20 U/L (ref 0–37)
Albumin: 4 g/dL (ref 3.5–5.2)
Alkaline Phosphatase: 88 U/L (ref 39–117)
BILIRUBIN DIRECT: 0 mg/dL (ref 0.0–0.3)
BILIRUBIN TOTAL: 0.5 mg/dL (ref 0.2–1.2)
Total Protein: 7.8 g/dL (ref 6.0–8.3)

## 2016-09-17 LAB — VITAMIN B12: Vitamin B-12: 557 pg/mL (ref 211–911)

## 2016-09-17 LAB — SEDIMENTATION RATE: Sed Rate: 58 mm/hr — ABNORMAL HIGH (ref 0–30)

## 2016-09-17 MED ORDER — OXYCODONE HCL 5 MG PO TABS: 5.0000 mg | ORAL_TABLET | Freq: Four times a day (QID) | ORAL | 0 refills | Status: DC | PRN

## 2016-09-17 MED ORDER — PHENTERMINE HCL 37.5 MG PO TABS: 37.5000 mg | ORAL_TABLET | Freq: Every day | ORAL | 2 refills | Status: DC

## 2016-09-17 NOTE — Addendum Note (Signed)
Addended by: Cresenciano Lick on: 09/17/2016 04:21 PM   Modules accepted: Orders

## 2016-09-17 NOTE — Progress Notes (Signed)
Pre visit review using our clinic review tool, if applicable. No additional management support is needed unless otherwise documented below in the visit note. 

## 2016-09-17 NOTE — Progress Notes (Signed)
Subjective:  Patient ID: Samantha [name], female    DOB: [birth year]  Age: 65 y.o. MRN: [id number]  CC: Back Pain and Numbness (In bilateral arms and legs)   HPI AMOREENA STENE presents for HTN, GERD, allergies f/u. C/o B arms going numb and c/o LBP irrad to B legs - worse. Pain is 9/10  Outpatient Medications Prior to Visit  Medication Sig Dispense Refill  . amLODipine (NORVASC) 5 MG tablet Take 1 tablet (5 mg total) by mouth daily. 90 tablet 3  . aspirin EC 81 MG tablet Take 81 mg by mouth daily.    Marland Kitchen BLACK COHOSH PO Take 1 tablet by mouth daily.    . Cholecalciferol (EQL VITAMIN D3) 1000 UNITS tablet Take 1,000 Units by mouth daily.     . fluticasone (FLONASE) 50 MCG/ACT nasal spray Place 2 sprays into the nose daily. (Patient taking differently: Place 2 sprays into the nose daily as needed for allergies. ) 16 g 4  . furosemide (LASIX) 20 MG tablet Take 1 tablet (20 mg total) by mouth daily. 90 tablet 3  . ibuprofen (ADVIL,MOTRIN) 400 MG tablet Take 1 tablet (400 mg total) by mouth 2 (two) times daily as needed for moderate pain (pc). (Patient taking differently: Take 400 mg by mouth 2 (two) times daily as needed (For pain.). ) 60 tablet 1  . loratadine (CLARITIN) 10 MG tablet Take 1 tablet (10 mg total) by mouth daily. 100 tablet 3  . losartan (COZAAR) 100 MG tablet Take 1 tablet (100 mg total) by mouth daily. 90 tablet 3  . Multiple Vitamin (MULTIVITAMIN WITH MINERALS) TABS tablet Take 1 tablet by mouth daily.     . naproxen sodium (ALEVE) 220 MG tablet Take 220 mg by mouth every 8 (eight) hours as needed (For pain.).    Marland Kitchen ondansetron (ZOFRAN) 8 MG tablet Take 8 mg by mouth every 8 (eight) hours as needed for nausea or vomiting. Reported on 07/03/2016    . oxyCODONE (OXY IR/ROXICODONE) 5 MG immediate release tablet Take 5 mg by mouth every 6 (six) hours as needed (For pain.). Reported on 07/03/2016    . pantoprazole (PROTONIX) 40 MG tablet Take 1 tablet (40 mg total) by mouth daily. 90  tablet 3  . potassium chloride SA (K-DUR,KLOR-CON) 20 MEQ tablet Take 1 tablet (20 mEq total) by mouth daily. 90 tablet 3   No facility-administered medications prior to visit.     ROS Review of Systems  Constitutional: Negative for activity change, appetite change, chills, fatigue and unexpected weight change.  HENT: Negative for congestion, mouth sores and sinus pressure.   Eyes: Negative for visual disturbance.  Respiratory: Negative for cough and chest tightness.   Gastrointestinal: Negative for abdominal pain and nausea.  Genitourinary: Negative for difficulty urinating, frequency and vaginal pain.  Musculoskeletal: Negative for back pain and gait problem.  Skin: Negative for pallor and rash.  Neurological: Negative for dizziness, tremors, weakness, numbness and headaches.  Psychiatric/Behavioral: Negative for confusion and sleep disturbance.    Objective:  BP 128/78   Pulse 88   Wt 296 lb (134.3 kg)   SpO2 96%   BMI 52.43 kg/m   BP Readings from Last 3 Encounters:  09/17/16 128/78  07/10/16 (!) 103/58  07/03/16 127/74    Wt Readings from Last 3 Encounters:  09/17/16 296 lb (134.3 kg)  07/10/16 294 lb (133.4 kg)  07/03/16 290 lb (131.5 kg)    Physical Exam  Constitutional: She appears well-developed. No distress.  HENT:  Head: Normocephalic.  Right Ear: External ear normal.  Left Ear: External ear normal.  Nose: Nose normal.  Mouth/Throat: Oropharynx is clear and moist.  Eyes: Conjunctivae are normal. Pupils are equal, round, and reactive to light. Right eye exhibits no discharge. Left eye exhibits no discharge.  Neck: Normal range of motion. Neck supple. No JVD present. No tracheal deviation present. No thyromegaly present.  Cardiovascular: Normal rate, regular rhythm and normal heart sounds.   Pulmonary/Chest: No stridor. No respiratory distress. She has no wheezes.  Abdominal: Soft. Bowel sounds are normal. She exhibits no distension and no mass. There is  no tenderness. There is no rebound and no guarding.  Musculoskeletal: She exhibits no edema or tenderness.  Lymphadenopathy:    She has no cervical adenopathy.  Neurological: She displays normal reflexes. No cranial nerve deficit. She exhibits normal muscle tone. Coordination normal.  Skin: No rash noted. No erythema.  Psychiatric: She has a normal mood and affect. Her behavior is normal. Judgment and thought content normal.  Obese LS tender Neck - tender CTS signs (+) B Lab Results  Component Value Date   WBC 4.7 06/02/2016   HGB 12.6 06/02/2016   HCT 40.5 06/02/2016   PLT 202 06/02/2016   GLUCOSE 93 06/02/2016   CHOL 145 04/13/2015   TRIG 83.0 04/13/2015   HDL 50.00 04/13/2015   LDLCALC 78 04/13/2015   ALT 17 06/02/2016   AST 22 06/02/2016   NA 140 06/02/2016   K 4.1 06/02/2016   CL 105 06/02/2016   CREATININE 0.81 06/02/2016   BUN 15 06/02/2016   CO2 26 06/02/2016   TSH 1.65 04/13/2015   INR 1.03 06/02/2016   HGBA1C 5.8 08/14/2015    Ct Chest Wo Contrast  Result Date: 07/16/2016 CLINICAL DATA:  Follow-up lung nodule EXAM: CT CHEST WITHOUT CONTRAST TECHNIQUE: Multidetector CT imaging of the chest was performed following the standard protocol without IV contrast. COMPARISON:  01/17/2016 FINDINGS: Cardiovascular: Heart size within normal limits. No pericardial effusion. Atherosclerotic calcifications of thoracic aorta. Mediastinum/Nodes: No mediastinal hematoma or adenopathy. No hilar adenopathy. Lungs/Pleura: Images of the lung parenchyma shows no acute infiltrate or pleural effusion. No pulmonary edema. Mild atelectasis noted in right lower lobe posterior medially. Minimal atelectasis in right upper lobe posteriorly. Axial image 51 there is stable 3 mm nodule in right middle lobe posteriorly. No new pulmonary nodules are noted. Upper Abdomen: Again noted micro nodular liver contour there is hyperdense probable post embolization lesion in right hepatic lobe anteriorly measures  2.9 cm. Musculoskeletal: No destructive bony lesions are noted. Sagittal images of the spine shows degenerative changes thoracic spine. Sagittal view of the sternum is unremarkable. IMPRESSION: 1. No infiltrate or pulmonary edema. 2. No mediastinal hematoma or adenopathy. 3. Stable 3 mm nodule in posterior aspect of the right middle lobe. No follow-up needed if patient is low-risk. Non-contrast chest CT can be considered in 12 months if patient is high-risk. This recommendation follows the consensus statement: Guidelines for Management of Incidental Pulmonary Nodules Detected on CT Images:From the Fleischner Society 2017; published online before print (10.1148/radiol.SG:5268862). No new pulmonary nodule is noted. 4. There is 2.9 cm hyperdense probable post embolization lesion in right hepatic lobe. This has decreased in size from prior exam when measured 4.9 cm. Electronically Signed   By: Lahoma Crocker M.D.   On: 07/16/2016 15:15    Assessment & Plan:   There are no diagnoses linked to this encounter. I am having Ms. Santori maintain her Cholecalciferol,  fluticasone, multivitamin with minerals, ondansetron, amLODipine, furosemide, loratadine, losartan, pantoprazole, potassium chloride SA, ibuprofen, oxyCODONE, BLACK COHOSH PO, naproxen sodium, and aspirin EC.  No orders of the defined types were placed in this encounter.    Follow-up: No Follow-up on file.  Walker Kehr, MD

## 2016-09-17 NOTE — Assessment & Plan Note (Signed)
S/p ablation 3/17

## 2016-09-17 NOTE — Assessment & Plan Note (Signed)
Worse Chronic ?spinal stenosis Naproxen prn Oxycodone prn  Potential benefits of opioids use as well as potential risks (i.e. addiction risk, apnea etc) and complications (i.e. Somnolence, constipation and others) were explained to the patient and were aknowledged. X rays

## 2016-09-17 NOTE — Assessment & Plan Note (Signed)
X ray Oxycodone prn  Potential benefits of opioids use as well as potential risks (i.e. addiction risk, apnea etc) and complications (i.e. Somnolence, constipation and others) were explained to the patient and were aknowledged.

## 2016-09-17 NOTE — Assessment & Plan Note (Signed)
Check AFP

## 2016-09-17 NOTE — Assessment & Plan Note (Signed)
Losartan, Norvasc, Lasix 

## 2016-09-17 NOTE — Assessment & Plan Note (Addendum)
Worse Loose wt Trial of Phentermine  Potential benefits of a long term stimulants  use as well as potential risks  and complications were explained to the patient and were aknowledged.

## 2016-09-18 ENCOUNTER — Ambulatory Visit: Payer: Medicare Other | Admitting: Internal Medicine

## 2016-09-18 LAB — AFP TUMOR MARKER: AFP TUMOR MARKER: 9 ng/mL — AB (ref <6.1)

## 2016-09-25 ENCOUNTER — Telehealth: Payer: Self-pay | Admitting: Internal Medicine

## 2016-09-25 NOTE — Telephone Encounter (Signed)
Pt called in and has questions about her lab and xray results .  Can you call her when you get a chance?

## 2016-09-25 NOTE — Telephone Encounter (Signed)
Left mess for patient to call back.  

## 2016-09-25 NOTE — Telephone Encounter (Signed)
Pt informed of below.   Entered by Cassandria Anger, MD at 09/17/2016 9:07 PM  Dear Hassan Rowan,  Your labs/tests are good. Your cervical spine shows osteoarthritis. Your lumbar spine X ray shows arthritis too.  Sincerely,  Walker Kehr, MD

## 2016-09-26 ENCOUNTER — Encounter: Payer: Self-pay | Admitting: Interventional Radiology

## 2016-10-15 ENCOUNTER — Encounter: Payer: Medicare Other | Admitting: Internal Medicine

## 2016-10-22 ENCOUNTER — Other Ambulatory Visit: Payer: Self-pay | Admitting: Hematology

## 2016-10-22 ENCOUNTER — Other Ambulatory Visit (HOSPITAL_COMMUNITY): Payer: Self-pay | Admitting: Interventional Radiology

## 2016-10-22 ENCOUNTER — Other Ambulatory Visit: Payer: Self-pay | Admitting: Radiology

## 2016-10-22 DIAGNOSIS — C220 Liver cell carcinoma: Secondary | ICD-10-CM

## 2016-10-28 ENCOUNTER — Ambulatory Visit (INDEPENDENT_AMBULATORY_CARE_PROVIDER_SITE_OTHER): Payer: Medicare Other | Admitting: Internal Medicine

## 2016-10-28 ENCOUNTER — Encounter: Payer: Self-pay | Admitting: Internal Medicine

## 2016-10-28 VITALS — BP 128/80 | HR 72 | Ht 63.0 in | Wt 295.0 lb

## 2016-10-28 DIAGNOSIS — M5440 Lumbago with sciatica, unspecified side: Secondary | ICD-10-CM | POA: Diagnosis not present

## 2016-10-28 DIAGNOSIS — G8929 Other chronic pain: Secondary | ICD-10-CM

## 2016-10-28 DIAGNOSIS — B182 Chronic viral hepatitis C: Secondary | ICD-10-CM | POA: Diagnosis not present

## 2016-10-28 DIAGNOSIS — Z0000 Encounter for general adult medical examination without abnormal findings: Secondary | ICD-10-CM | POA: Diagnosis not present

## 2016-10-28 DIAGNOSIS — I10 Essential (primary) hypertension: Secondary | ICD-10-CM

## 2016-10-28 DIAGNOSIS — R600 Localized edema: Secondary | ICD-10-CM | POA: Diagnosis not present

## 2016-10-28 DIAGNOSIS — M5442 Lumbago with sciatica, left side: Secondary | ICD-10-CM

## 2016-10-28 DIAGNOSIS — C220 Liver cell carcinoma: Secondary | ICD-10-CM

## 2016-10-28 MED ORDER — PHENTERMINE HCL 37.5 MG PO TABS: 37.5000 mg | ORAL_TABLET | Freq: Every day | ORAL | 2 refills | Status: DC

## 2016-10-28 MED ORDER — OXYCODONE HCL 5 MG PO TABS: 5.0000 mg | ORAL_TABLET | Freq: Four times a day (QID) | ORAL | 0 refills | Status: DC | PRN

## 2016-10-28 NOTE — Assessment & Plan Note (Signed)
F/u w/Dr Linus Salmons

## 2016-10-28 NOTE — Progress Notes (Signed)
Pre visit review using our clinic review tool, if applicable. No additional management support is needed unless otherwise documented below in the visit note. 

## 2016-10-28 NOTE — Assessment & Plan Note (Signed)
Losartan, Norvasc, Lasix 

## 2016-10-28 NOTE — Assessment & Plan Note (Addendum)
F/u w/Dr Burr Medico and Dr Laurence Ferrari (IR) Liver MRI is pending

## 2016-10-28 NOTE — Assessment & Plan Note (Addendum)
Oxycodone prn MRI  if worse

## 2016-10-28 NOTE — Patient Instructions (Signed)
Stop amlodipine   Kegel Exercises The goal of Kegel exercises is to isolate and exercise your pelvic floor muscles. These muscles act as a hammock that supports the rectum, vagina, small intestine, and uterus. As the muscles weaken, the hammock sags and these organs are displaced from their normal positions. Kegel exercises can strengthen your pelvic floor muscles and help you to improve bladder and bowel control, improve sexual response, and help reduce many problems and some discomfort during pregnancy. Kegel exercises can be done anywhere and at any time. HOW TO PERFORM KEGEL EXERCISES 1. Locate your pelvic floor muscles. To do this, squeeze (contract) the muscles that you use when you try to stop the flow of urine. You will feel a tightness in the vaginal area (women) and a tight lift in the rectal area (men and women). 2. When you begin, contract your pelvic muscles tight for 2-5 seconds, then relax them for 2-5 seconds. This is one set. Do 4-5 sets with a short pause in between. 3. Contract your pelvic muscles for 8-10 seconds, then relax them for 8-10 seconds. Do 4-5 sets. If you cannot contract your pelvic muscles for 8-10 seconds, try 5-7 seconds and work your way up to 8-10 seconds. Your goal is 4-5 sets of 10 contractions each day. Keep your stomach, buttocks, and legs relaxed during the exercises. Perform sets of both short and long contractions. Vary your positions. Perform these contractions 3-4 times per day. Perform sets while you are:   Lying in bed in the morning.  Standing at lunch.  Sitting in the late afternoon.  Lying in bed at night. You should do 40-50 contractions per day. Do not perform more Kegel exercises per day than recommended. Overexercising can cause muscle fatigue. Continue these exercises for for at least 15-20 weeks or as directed by your caregiver.   This information is not intended to replace advice given to you by your health care provider. Make sure you  discuss any questions you have with your health care provider.   Document Released: 12/01/2012 Document Revised: 01/05/2015 Document Reviewed: 12/01/2012 Elsevier Interactive Patient Education [employer].

## 2016-10-28 NOTE — Assessment & Plan Note (Signed)
Here for medicare wellness/physical  Diet: heart healthy  Physical activity: sedentary due to pain Depression/mood screen: dealing w/cancer Hearing: intact to whispered voice  Visual acuity: grossly normal, performs annual eye exam  ADLs: capable  Fall risk: low  Home safety: good  Cognitive evaluation: intact to orientation, naming, recall and repetition  EOL planning: adv directives, full code/ I agree  I have personally reviewed and have noted  1. The patient's medical, surgical and social history  2. Their use of alcohol, tobacco or illicit drugs  3. Their current medications and supplements  4. The patient's functional ability including ADL's, fall risks, home safety risks and hearing or visual impairment.  5. Diet and physical activities  6. Evidence for depression or mood disorders 7. The roster of all physicians providing medical care to patient - is listed in the Snapshot section of the chart and reviewed today.    Today patient counseled on age appropriate routine health concerns for screening and prevention, each reviewed and up to date or declined. Immunizations reviewed and up to date or declined. Labs ordered and reviewed. Risk factors for depression reviewed and negative. Hearing function and visual acuity are intact. ADLs screened and addressed as needed. Functional ability and level of safety reviewed and appropriate. Education, counseling and referrals performed based on assessed risks today. Patient provided with a copy of personalized plan for preventive services.

## 2016-10-28 NOTE — Assessment & Plan Note (Signed)
Phentermine Wt Readings from Last 3 Encounters:  10/28/16 295 lb (133.8 kg)  09/17/16 296 lb (134.3 kg)  07/10/16 294 lb (133.4 kg)

## 2016-10-28 NOTE — Assessment & Plan Note (Signed)
Stop amlodipine.

## 2016-10-28 NOTE — Progress Notes (Signed)
Subjective:  Patient ID: Samantha [name], female    DOB: 07/31/1951  Age: 65 y.o. MRN: [id number]  CC: No chief complaint on file.   HPI Samantha [name] presents for a well exam C/o LBP ?worse 8-10/10 w/o meds - it helps to lean over a grocery cart; Oxycodone helps F/u liver Ca, obesity and wt gain f/u.  Outpatient Medications Prior to Visit  Medication Sig Dispense Refill  . amLODipine (NORVASC) 5 MG tablet Take 1 tablet (5 mg total) by mouth daily. 90 tablet 3  . aspirin EC 81 MG tablet Take 81 mg by mouth daily.    Marland Kitchen BLACK COHOSH PO Take 1 tablet by mouth daily.    . Cholecalciferol (EQL VITAMIN D3) 1000 UNITS tablet Take 1,000 Units by mouth daily.     . fluticasone (FLONASE) 50 MCG/ACT nasal spray Place 2 sprays into the nose daily. (Patient taking differently: Place 2 sprays into the nose daily as needed for allergies. ) 16 g 4  . furosemide (LASIX) 20 MG tablet Take 1 tablet (20 mg total) by mouth daily. 90 tablet 3  . loratadine (CLARITIN) 10 MG tablet Take 1 tablet (10 mg total) by mouth daily. 100 tablet 3  . losartan (COZAAR) 100 MG tablet Take 1 tablet (100 mg total) by mouth daily. 90 tablet 3  . Multiple Vitamin (MULTIVITAMIN WITH MINERALS) TABS tablet Take 1 tablet by mouth daily.     . naproxen sodium (ALEVE) 220 MG tablet Take 220 mg by mouth every 8 (eight) hours as needed (For pain.).    Marland Kitchen ondansetron (ZOFRAN) 8 MG tablet Take 8 mg by mouth every 8 (eight) hours as needed for nausea or vomiting. Reported on 07/03/2016    . oxyCODONE (OXY IR/ROXICODONE) 5 MG immediate release tablet Take 1 tablet (5 mg total) by mouth every 6 (six) hours as needed (For pain.). Reported on 07/03/2016 60 tablet 0  . pantoprazole (PROTONIX) 40 MG tablet Take 1 tablet (40 mg total) by mouth daily. 90 tablet 3  . potassium chloride SA (K-DUR,KLOR-CON) 20 MEQ tablet Take 1 tablet (20 mEq total) by mouth daily. 90 tablet 3  . phentermine (ADIPEX-P) 37.5 MG tablet Take 1 tablet (37.5 mg total) by  mouth daily before breakfast. 30 tablet 2   No facility-administered medications prior to visit.     ROS Review of Systems  Constitutional: Negative for activity change, appetite change, chills, fatigue and unexpected weight change.  HENT: Negative for congestion, mouth sores and sinus pressure.   Eyes: Negative for visual disturbance.  Respiratory: Negative for cough and chest tightness.   Gastrointestinal: Negative for abdominal pain and nausea.  Genitourinary: Negative for difficulty urinating, frequency and vaginal pain.  Musculoskeletal: Positive for back pain and gait problem.  Skin: Negative for pallor and rash.  Neurological: Negative for dizziness, tremors, weakness, numbness and headaches.  Psychiatric/Behavioral: Negative for confusion and sleep disturbance. The patient is not nervous/anxious.   edema B ankles Obese LS tender w/ROM  Objective:  BP 128/80   Pulse 72   Ht 5\' 3"  (1.6 m)   Wt 295 lb (133.8 kg)   BMI 52.26 kg/m   BP Readings from Last 3 Encounters:  10/28/16 128/80  09/17/16 128/78  07/10/16 (!) 103/58    Wt Readings from Last 3 Encounters:  10/28/16 295 lb (133.8 kg)  09/17/16 296 lb (134.3 kg)  07/10/16 294 lb (133.4 kg)    Physical Exam  Constitutional: She appears well-developed. No distress.  HENT:  Head: Normocephalic.  Right Ear: External ear normal.  Left Ear: External ear normal.  Nose: Nose normal.  Mouth/Throat: Oropharynx is clear and moist.  Eyes: Conjunctivae are normal. Pupils are equal, round, and reactive to light. Right eye exhibits no discharge. Left eye exhibits no discharge.  Neck: Normal range of motion. Neck supple. No JVD present. No tracheal deviation present. No thyromegaly present.  Cardiovascular: Normal rate, regular rhythm and normal heart sounds.   Pulmonary/Chest: No stridor. No respiratory distress. She has no wheezes.  Abdominal: Soft. Bowel sounds are normal. She exhibits no distension and no mass. There is  no tenderness. There is no rebound and no guarding.  Musculoskeletal: She exhibits tenderness. She exhibits no edema.  Lymphadenopathy:    She has no cervical adenopathy.  Neurological: She displays normal reflexes. No cranial nerve deficit. She exhibits normal muscle tone. Coordination normal.  Skin: No rash noted. No erythema.  Psychiatric: She has a normal mood and affect. Her behavior is normal. Judgment and thought content normal.  Obese  Lab Results  Component Value Date   WBC 4.9 09/17/2016   HGB 13.7 09/17/2016   HCT 42.0 09/17/2016   PLT 209.0 09/17/2016   GLUCOSE 102 (H) 09/17/2016   CHOL 145 04/13/2015   TRIG 83.0 04/13/2015   HDL 50.00 04/13/2015   LDLCALC 78 04/13/2015   ALT 16 09/17/2016   AST 20 09/17/2016   NA 140 09/17/2016   K 4.1 09/17/2016   CL 105 09/17/2016   CREATININE 0.94 09/17/2016   BUN 17 09/17/2016   CO2 28 09/17/2016   TSH 1.65 04/13/2015   INR 1.03 06/02/2016   HGBA1C 5.8 08/14/2015    Dg Cervical Spine Complete  Result Date: 09/17/2016 CLINICAL DATA:  Pain.  No known injury.  Initial evaluation. EXAM: CERVICAL SPINE - COMPLETE 4+ VIEW COMPARISON:  No recent prior.  CT 07/16/2016 and 01/17/2016. FINDINGS: Diffuse multilevel degenerative change. Degenerative changes are particularly severe at C4 through C7 with prominent endplate osteophyte formations and disc space loss at these levels. No evidence of fracture or dislocation. Pulmonary apices are clear. IMPRESSION: Diffuse multilevel degenerative change. Degenerative changes are particular severe at C4 through C7. No acute bony abnormality. Electronically Signed   By: Marcello Moores  Register   On: 09/17/2016 17:15   Dg Lumbar Spine 2-3 Views  Result Date: 09/17/2016 CLINICAL DATA:  Low back pain, history of liver carcinoma EXAM: LUMBAR SPINE - 2-3 VIEW COMPARISON:  CT chest of 07/16/2016 FINDINGS: The lumbar vertebrae are in normal alignment. Intervertebral disc spaces appear normal. No compression  deformity is seen. There is degenerative change involves the facet joints of the lower lumbar spine. IMPRESSION: Normal alignment with normal disc spaces. Degenerative change of the facet joints of the lower lumbar spine. Electronically Signed   By: Ivar Drape M.D.   On: 09/17/2016 17:17    Assessment & Plan:   There are no diagnoses linked to this encounter. I am having Ms. Felkins maintain her Cholecalciferol, fluticasone, multivitamin with minerals, ondansetron, amLODipine, furosemide, loratadine, losartan, pantoprazole, potassium chloride SA, BLACK COHOSH PO, naproxen sodium, aspirin EC, oxyCODONE, and phentermine.  No orders of the defined types were placed in this encounter.    Follow-up: No Follow-up on file.  Walker Kehr, MD

## 2016-11-07 DIAGNOSIS — C220 Liver cell carcinoma: Secondary | ICD-10-CM | POA: Diagnosis not present

## 2016-11-07 LAB — PROTIME-INR
INR: 1
Prothrombin Time: 11.1 s (ref 9.0–11.5)

## 2016-11-07 LAB — CBC
HEMATOCRIT: 41.8 % (ref 35.0–45.0)
Hemoglobin: 13.2 g/dL (ref 11.7–15.5)
MCH: 25.8 pg — AB (ref 27.0–33.0)
MCHC: 31.6 g/dL — AB (ref 32.0–36.0)
MCV: 81.8 fL (ref 80.0–100.0)
MPV: 11.3 fL (ref 7.5–12.5)
Platelets: 214 10*3/uL (ref 140–400)
RBC: 5.11 MIL/uL — ABNORMAL HIGH (ref 3.80–5.10)
RDW: 14.5 % (ref 11.0–15.0)
WBC: 4.2 10*3/uL (ref 3.8–10.8)

## 2016-11-08 LAB — AFP TUMOR MARKER: AFP-Tumor Marker: 7.8 ng/mL — ABNORMAL HIGH

## 2016-11-08 LAB — COMPREHENSIVE METABOLIC PANEL
ALT: 20 U/L (ref 6–29)
AST: 26 U/L (ref 10–35)
Albumin: 3.9 g/dL (ref 3.6–5.1)
Alkaline Phosphatase: 78 U/L (ref 33–130)
BUN: 10 mg/dL (ref 7–25)
CHLORIDE: 106 mmol/L (ref 98–110)
CO2: 24 mmol/L (ref 20–31)
CREATININE: 0.73 mg/dL (ref 0.50–0.99)
Calcium: 9.2 mg/dL (ref 8.6–10.4)
GLUCOSE: 100 mg/dL — AB (ref 65–99)
POTASSIUM: 4.3 mmol/L (ref 3.5–5.3)
SODIUM: 139 mmol/L (ref 135–146)
Total Bilirubin: 0.5 mg/dL (ref 0.2–1.2)
Total Protein: 7.2 g/dL (ref 6.1–8.1)

## 2016-11-18 ENCOUNTER — Other Ambulatory Visit (HOSPITAL_COMMUNITY): Payer: Self-pay | Admitting: Interventional Radiology

## 2016-11-18 ENCOUNTER — Ambulatory Visit
Admission: RE | Admit: 2016-11-18 | Discharge: 2016-11-18 | Disposition: A | Discharge status: Home / Self Care | Payer: Medicare Other | Source: Ambulatory Visit | Attending: Hematology | Admitting: Hematology

## 2016-11-18 ENCOUNTER — Ambulatory Visit
Admission: RE | Admit: 2016-11-18 | Discharge: 2016-11-18 | Disposition: A | Discharge status: Home / Self Care | Payer: Medicare Other | Source: Ambulatory Visit | Attending: Interventional Radiology | Admitting: Interventional Radiology

## 2016-11-18 DIAGNOSIS — C220 Liver cell carcinoma: Secondary | ICD-10-CM

## 2016-11-18 HISTORY — PX: IR GENERIC HISTORICAL: IMG1180011

## 2016-11-18 MED ORDER — GADOXETATE DISODIUM 0.25 MMOL/ML IV SOLN
10.0000 mL | Freq: Once | INTRAVENOUS | Status: AC | PRN
  Administered 2016-11-18: 10 mL via INTRAVENOUS

## 2016-11-18 NOTE — Progress Notes (Signed)
Chief Complaint: Hepatocellular cancer S/P Lipiodol transarterial chemo embolization on 2/23 and then subsequent CT guided thermal ablation of this lesion on 03/21/2016 by Dr. Laurence Ferrari  Supervising Physician: [name]  History of Present Illness: Samantha [name] is a 64 y.o. 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  She underwent conventional lipiodol transarterial chemo embolization on 2/23 and then subsequent CT guided thermal ablation of this lesion on 03/21/2016.  She presents today for 6 month follow up. She had MRI and labs drawn today.  She is doing very well, she only c/o some back pain. She states it is related to her arthritis and she denies any falls or injury.  She states the pain is worse on the right and radiates down her buttock, thigh, and back of her leg.  She takes Oxycodone which is prescribed by Dr. Mignon Pine.  She asked for a refill on her oxycodone today.   I suggested referral to our spine center for consideration for injection. She was adamant that she did not wish to have any kind of surgery.  She reports she is trying to lose some weight and is now taking Phentermine to help with this.  MRI looks very good. Tumor appears completely ablated.  Labs all normal today.   Past Medical History:  Diagnosis Date  . Allergy    seasonal  . Cancer (Center) 2017   liver cancer  . Elevated glucose    no longer an issue 03-18-16  . GERD (gastroesophageal reflux disease)   . H/O seasonal allergies   . Hepatitis C    hepatitis c completed march 2017  . Hyperlipidemia 2011  . Hypertension   . LBP (low back pain)   . Obesity   . Osteoarthritis    B knees    Past Surgical History:  Procedure Laterality Date  . CHOLECYSTECTOMY    . COLONOSCOPY N/A 01/09/2015   Procedure: COLONOSCOPY;  Surgeon: Jerene Bears, MD;  Location: [facility];   Service: Gastroenterology;  Laterality: N/A;  . ESOPHAGOGASTRODUODENOSCOPY (EGD) WITH PROPOFOL N/A 06/10/2016   Procedure: ESOPHAGOGASTRODUODENOSCOPY (EGD) WITH PROPOFOL;  Surgeon: Jerene Bears, MD;  Location: [facility];  Service: Gastroenterology;  Laterality: N/A;  . IR GENERIC HISTORICAL  07/03/2016   IR RADIOLOGIST EVAL & MGMT 07/03/2016 [name], MD [facility] INTERV RAD  . JOINT REPLACEMENT    . left shoulder arthroplasty    . right knee replacement  2007  . total knee replacement L  03-13-08  . TUBAL LIGATION      Allergies: Amlodipine; Benazepril hcl; and Codeine  Medications: Prior to Admission medications   Medication Sig Start Date End Date Taking? Authorizing Provider  aspirin EC 81 MG tablet Take 81 mg by mouth daily.   Yes Historical Provider, MD  BLACK COHOSH PO Take 1 tablet by mouth daily.   Yes Historical Provider, MD  Cholecalciferol (EQL VITAMIN D3) 1000 UNITS tablet Take 1,000 Units by mouth daily.    Yes Historical Provider, MD  furosemide (LASIX) 20 MG tablet Take 1 tablet (20 mg total) by mouth daily. 04/14/16  Yes Evie Lacks Plotnikov, MD  loratadine (CLARITIN) 10 MG tablet Take 1 tablet (10 mg total) by mouth daily. 04/14/16  Yes Evie Lacks Plotnikov, MD  losartan (COZAAR) 100 MG tablet Take 1 tablet (100 mg total) by mouth daily. 04/14/16  Yes Cassandria Anger, MD  Multiple Vitamin (MULTIVITAMIN WITH MINERALS)  TABS tablet Take 1 tablet by mouth daily.    Yes Historical Provider, MD  naproxen sodium (ALEVE) 220 MG tablet Take 220 mg by mouth every 8 (eight) hours as needed (For pain.).   Yes Historical Provider, MD  ondansetron (ZOFRAN) 8 MG tablet Take 8 mg by mouth every 8 (eight) hours as needed for nausea or vomiting. Reported on 07/03/2016 02/22/16  Yes Historical Provider, MD  oxyCODONE (OXY IR/ROXICODONE) 5 MG immediate release tablet Take 1 tablet (5 mg total) by mouth every 6 (six) hours as needed (For pain.). 10/28/16  Yes Evie Lacks Plotnikov, MD    pantoprazole (PROTONIX) 40 MG tablet Take 1 tablet (40 mg total) by mouth daily. 04/14/16  Yes Cassandria Anger, MD  phentermine (ADIPEX-P) 37.5 MG tablet Take 1 tablet (37.5 mg total) by mouth daily before breakfast. 10/28/16 11/27/16 Yes Evie Lacks Plotnikov, MD  potassium chloride SA (K-DUR,KLOR-CON) 20 MEQ tablet Take 1 tablet (20 mEq total) by mouth daily. 04/14/16  Yes Cassandria Anger, MD     Family History  Problem Relation Age of Onset  . Hypertension Mother   . Ovarian cancer Mother   . Hypertension Father   . Cancer Father 50    prostate cancer   . Diabetes Other   . Hypertension Other   . Colon cancer Neg Hx   . Esophageal cancer Neg Hx   . Rectal cancer Neg Hx   . Stomach cancer Neg Hx     Social History   Social History  . Marital status: Single    Spouse name: N/A  . Number of children: 4  . Years of education: N/A   Social History Main Topics  . Smoking status: Former Smoker    Packs/day: 0.25    Years: 20.00    Quit date: 12/30/2007  . Smokeless tobacco: Never Used  . 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 Drug use: No  . Sexual activity: Not Currently   Other Topics Concern  . None   Social History Narrative   Regular exercise: walk a few times a week   Caffeine use: none   Single, raising her young great granddaughter and watches grandchildren after school   Has a room mate to help prn   Retired from nursing tech--worked at [employer]   Independent/drives    Review of Systems: A 12 point ROS discussed and pertinent positives are indicated in the HPI above.  All other systems are negative.  Review of Systems  Vital Signs: BP (!) 149/83   Pulse 88   Temp 98 F (36.7 C)   Resp 20   Physical Exam  Constitutional: She is oriented to person, place, and time.  Obese, NAD  HENT:  Head: Normocephalic and atraumatic.  Eyes: EOM are normal.  Neck: Normal  range of motion.  Cardiovascular: Normal rate, regular rhythm and normal heart sounds.   Pulmonary/Chest: Effort normal and breath sounds normal. No respiratory distress. She has no wheezes.  Abdominal: Soft. There is tenderness.  + RUQ tenderness  Musculoskeletal: Normal range of motion.  Neurological: She is alert and oriented to person, place, and time.  Skin: Skin is warm and dry.  Psychiatric: She has a normal mood and affect. Her behavior is normal. Judgment and thought content normal.    Imaging: No results found.  Labs:  CBC:  Recent Labs  04/07/16  1308 06/02/16 1405 09/17/16 1527 11/07/16 1324  WBC 5.3 4.7 4.9 4.2  HGB 13.1 12.6 13.7 13.2  HCT 41.1 40.5 42.0 41.8  PLT 264 202 209.0 214    COAGS:  Recent Labs  02/01/16 1109  03/18/16 1200 03/21/16 1134 04/07/16 1308 06/02/16 1405 11/07/16 1324  INR 1.05  < > 1.05 1.15 1.07 1.03 1.0  APTT 29  --  31 29  --   --   --   < > = values in this interval not displayed.  BMP:  Recent Labs  03/18/16 1200 03/21/16 1134 03/22/16 0340 04/07/16 1308 06/02/16 1405 09/17/16 1527 11/07/16 1324  NA 143 141 142 142 140 140 139  K 4.4 3.6 3.7 4.2 4.1 4.1 4.3  CL 109 108 108 107 105 105 106  CO2 26 24 26 21 26 28 24   GLUCOSE 95 128* 127* 95 93 102* 100*  BUN 16 16 13 16 15 17 10   CALCIUM 9.4 8.7* 8.3* 9.5 9.0 9.4 9.2  CREATININE 0.80 0.78 0.84 0.85 0.81 0.94 0.73  GFRNONAA >60 >60 >60 72  --   --   --   GFRAA >60 >60 >60 83  --   --   --     LIVER FUNCTION TESTS:  Recent Labs  04/07/16 1308 06/02/16 1405 09/17/16 1527 11/07/16 1324  BILITOT 0.4 0.4 0.5 0.5  AST 37* 22 20 26   ALT 40* 17 16 20   ALKPHOS 110 98 88 78  PROT 7.4 6.9 7.8 7.2  ALBUMIN 3.9 3.8 4.0 3.9    TUMOR MARKERS:  Recent Labs  12/20/15 1143 06/02/16 1405 09/17/16 1527 11/07/16 1324  AFPTM 21.7* 8.6* 9.0* 7.8*    Assessment:  S/p conventional lipiodol transarterial chemo embolization on 2/23 followed by subsequent CT  guided thermal ablation of this lesion on 03/21/2016.  MRI and Labs look good  Will repeat MRI of the abdomen in 3 months.  As far as her back pain, we will refer her to our spine center for evaluation and possible injection.  Dr. Laurence Ferrari explained that we could not prescribe more narcotic medications as she is already getting pain meds from Dr. Alain Marion. He explained that it is best for only one provider to prescribe narcotic medications, especially given the changes in laws regarding this due to the narcotic abuse epidemic. She understood.  Electronically Signed: Murrell Redden PA-C 11/18/2016, 2:08 PM   Please refer to Dr. Katrinka Blazing attestation of this note for management and plan.

## 2016-11-21 ENCOUNTER — Ambulatory Visit (HOSPITAL_COMMUNITY)
Admission: RE | Admit: 2016-11-21 | Discharge: 2016-11-21 | Disposition: A | Discharge status: Home / Self Care | Payer: Medicare Other | Source: Ambulatory Visit | Attending: Interventional Radiology | Admitting: Interventional Radiology

## 2016-11-21 DIAGNOSIS — M47816 Spondylosis without myelopathy or radiculopathy, lumbar region: Secondary | ICD-10-CM | POA: Insufficient documentation

## 2016-11-21 DIAGNOSIS — M48061 Spinal stenosis, lumbar region without neurogenic claudication: Secondary | ICD-10-CM | POA: Insufficient documentation

## 2016-11-21 DIAGNOSIS — M1288 Other specific arthropathies, not elsewhere classified, other specified site: Secondary | ICD-10-CM | POA: Diagnosis not present

## 2016-11-21 DIAGNOSIS — K5730 Diverticulosis of large intestine without perforation or abscess without bleeding: Secondary | ICD-10-CM | POA: Insufficient documentation

## 2016-11-21 DIAGNOSIS — C220 Liver cell carcinoma: Secondary | ICD-10-CM | POA: Insufficient documentation

## 2016-11-21 DIAGNOSIS — M461 Sacroiliitis, not elsewhere classified: Secondary | ICD-10-CM | POA: Diagnosis not present

## 2016-11-21 DIAGNOSIS — D259 Leiomyoma of uterus, unspecified: Secondary | ICD-10-CM | POA: Diagnosis not present

## 2016-11-24 ENCOUNTER — Telehealth: Payer: Self-pay | Admitting: Internal Medicine

## 2016-11-24 DIAGNOSIS — G8929 Other chronic pain: Secondary | ICD-10-CM

## 2016-11-24 DIAGNOSIS — M545 Low back pain: Principal | ICD-10-CM

## 2016-11-24 NOTE — Telephone Encounter (Signed)
What is BG facet - there is no order "BG facet"  What order does she need? Thx

## 2016-11-24 NOTE — Telephone Encounter (Signed)
Was referred to by Dr. Laurence Ferrari for portal vein embolization.  When seen patient on follow up patient was still complaining of back.  States MRI was done on 11/24.   Dr. Laurence Ferrari wants patient referred to [facility] for an order BG Facet with comments "Facet vs. ESI".  Is requesting Plot to enter order.

## 2016-11-25 NOTE — Telephone Encounter (Signed)
ok 

## 2016-11-25 NOTE — Telephone Encounter (Signed)
Please place a referral to Dr. Laurence Ferrari @ [facility] for an order with   "BG Facet in the comments along with  "Facet vs. ESI"

## 2016-12-04 ENCOUNTER — Other Ambulatory Visit: Payer: Self-pay | Admitting: Internal Medicine

## 2016-12-04 DIAGNOSIS — M545 Low back pain: Principal | ICD-10-CM

## 2016-12-04 DIAGNOSIS — G8929 Other chronic pain: Secondary | ICD-10-CM

## 2016-12-10 ENCOUNTER — Ambulatory Visit (INDEPENDENT_AMBULATORY_CARE_PROVIDER_SITE_OTHER): Payer: Medicare Other | Admitting: Internal Medicine

## 2016-12-10 ENCOUNTER — Encounter: Payer: Self-pay | Admitting: Internal Medicine

## 2016-12-10 DIAGNOSIS — G8929 Other chronic pain: Secondary | ICD-10-CM | POA: Diagnosis not present

## 2016-12-10 DIAGNOSIS — C220 Liver cell carcinoma: Secondary | ICD-10-CM

## 2016-12-10 DIAGNOSIS — M5440 Lumbago with sciatica, unspecified side: Secondary | ICD-10-CM | POA: Diagnosis not present

## 2016-12-10 DIAGNOSIS — K7460 Unspecified cirrhosis of liver: Secondary | ICD-10-CM

## 2016-12-10 MED ORDER — MELOXICAM 7.5 MG PO TABS: 7.5000 mg | ORAL_TABLET | Freq: Every day | ORAL | 1 refills | Status: DC | PRN

## 2016-12-10 MED ORDER — OXYCODONE HCL 5 MG PO TABS: 5.0000 mg | ORAL_TABLET | Freq: Four times a day (QID) | ORAL | 0 refills | Status: DC | PRN

## 2016-12-10 MED ORDER — KETOROLAC TROMETHAMINE 60 MG/2ML IM SOLN
60.0000 mg | Freq: Once | INTRAMUSCULAR | Status: AC
  Administered 2016-12-10: 60 mg via INTRAMUSCULAR

## 2016-12-10 NOTE — Assessment & Plan Note (Signed)
In remission.

## 2016-12-10 NOTE — Assessment & Plan Note (Signed)
Meloxicam, Oxycodone prn - w/caution Toradol 60 mg IM Epidural - next week To ER if worse  Potential benefits of a long term opioids use as well as potential risks (i.e. addiction risk, apnea etc) and complications (i.e. Somnolence, constipation and others) were explained to the patient and were aknowledged.  Potential benefits of NSAID use as well as potential risks  and complications were explained to the patient and were aknowledged.

## 2016-12-10 NOTE — Progress Notes (Signed)
Pre visit review using our clinic review tool, if applicable. No additional management support is needed unless otherwise documented below in the visit note. 

## 2016-12-10 NOTE — Addendum Note (Signed)
Addended by: Cresenciano Lick on: 12/10/2016 04:53 PM   Modules accepted: Orders

## 2016-12-10 NOTE — Progress Notes (Signed)
Subjective:  Patient ID: Samantha [name], female    DOB: 1951-06-05  Age: 65 y.o. MRN: [id number]  CC: No chief complaint on file.   Back Pain  This is a chronic problem. The current episode started more than 1 month ago. The pain is present in the lumbar spine. The pain radiates to the right thigh and left thigh. The pain is at a severity of 10/10. The pain is severe. The pain is worse during the day. The symptoms are aggravated by sitting and position. Associated symptoms include weight loss. Pertinent negatives include no abdominal pain, bladder incontinence, headaches, numbness or weakness. Risk factors include history of cancer. She has tried bed rest, analgesics and NSAIDs for the symptoms. The treatment provided mild relief.   [place] presents for severe LBP. She is planning to have a lower back epidural shot next Fri. LS spine MRI - no cancer  Outpatient Medications Prior to Visit  Medication Sig Dispense Refill  . aspirin EC 81 MG tablet Take 81 mg by mouth daily.    Marland Kitchen BLACK COHOSH PO Take 1 tablet by mouth daily.    . Cholecalciferol (EQL VITAMIN D3) 1000 UNITS tablet Take 1,000 Units by mouth daily.     . furosemide (LASIX) 20 MG tablet Take 1 tablet (20 mg total) by mouth daily. 90 tablet 3  . loratadine (CLARITIN) 10 MG tablet Take 1 tablet (10 mg total) by mouth daily. 100 tablet 3  . losartan (COZAAR) 100 MG tablet Take 1 tablet (100 mg total) by mouth daily. 90 tablet 3  . Multiple Vitamin (MULTIVITAMIN WITH MINERALS) TABS tablet Take 1 tablet by mouth daily.     . naproxen sodium (ALEVE) 220 MG tablet Take 220 mg by mouth every 8 (eight) hours as needed (For pain.).    Marland Kitchen ondansetron (ZOFRAN) 8 MG tablet Take 8 mg by mouth every 8 (eight) hours as needed for nausea or vomiting. Reported on 07/03/2016    . oxyCODONE (OXY IR/ROXICODONE) 5 MG immediate release tablet Take 1 tablet (5 mg total) by mouth every 6 (six) hours as needed (For pain.). 60 tablet 0  .  pantoprazole (PROTONIX) 40 MG tablet Take 1 tablet (40 mg total) by mouth daily. 90 tablet 3  . potassium chloride SA (K-DUR,KLOR-CON) 20 MEQ tablet Take 1 tablet (20 mEq total) by mouth daily. 90 tablet 3  . phentermine (ADIPEX-P) 37.5 MG tablet Take 1 tablet (37.5 mg total) by mouth daily before breakfast. 30 tablet 2   No facility-administered medications prior to visit.     ROS Review of Systems  Constitutional: Positive for weight loss. Negative for activity change, appetite change, chills, fatigue and unexpected weight change.  HENT: Negative for congestion, mouth sores and sinus pressure.   Eyes: Negative for visual disturbance.  Respiratory: Negative for cough and chest tightness.   Gastrointestinal: Negative for abdominal pain and nausea.  Genitourinary: Negative for bladder incontinence, difficulty urinating, frequency and vaginal pain.  Musculoskeletal: Positive for back pain and gait problem.  Skin: Negative for pallor and rash.  Neurological: Negative for dizziness, tremors, weakness, numbness and headaches.  Psychiatric/Behavioral: Negative for confusion and sleep disturbance.    Objective:  BP (!) 138/96   Pulse (!) 104   Wt 288 lb (130.6 kg)   BMI 51.02 kg/m   BP Readings from Last 3 Encounters:  12/10/16 (!) 138/96  11/18/16 (!) 149/83  10/28/16 128/80    Wt Readings from Last 3 Encounters:  12/10/16 288  lb (130.6 kg)  10/28/16 295 lb (133.8 kg)  09/17/16 296 lb (134.3 kg)    Physical Exam  Constitutional: She appears well-developed. No distress.  HENT:  Head: Normocephalic.  Right Ear: External ear normal.  Left Ear: External ear normal.  Nose: Nose normal.  Mouth/Throat: Oropharynx is clear and moist.  Eyes: Conjunctivae are normal. Pupils are equal, round, and reactive to light. Right eye exhibits no discharge. Left eye exhibits no discharge.  Neck: Normal range of motion. Neck supple. No JVD present. No tracheal deviation present. No thyromegaly  present.  Cardiovascular: Normal rate, regular rhythm and normal heart sounds.   Pulmonary/Chest: No stridor. No respiratory distress. She has no wheezes.  Abdominal: Soft. Bowel sounds are normal. She exhibits no distension and no mass. There is no tenderness. There is no rebound and no guarding.  Musculoskeletal: She exhibits tenderness. She exhibits no edema.  Lymphadenopathy:    She has no cervical adenopathy.  Neurological: She displays normal reflexes. No cranial nerve deficit. She exhibits normal muscle tone. Coordination normal.  Skin: No rash noted. No erythema.  Psychiatric: She has a normal mood and affect. Her behavior is normal. Judgment and thought content normal.  tearful LS is very tender w/palp and ROM  Lab Results  Component Value Date   WBC 4.2 11/07/2016   HGB 13.2 11/07/2016   HCT 41.8 11/07/2016   PLT 214 11/07/2016   GLUCOSE 100 (H) 11/07/2016   CHOL 145 04/13/2015   TRIG 83.0 04/13/2015   HDL 50.00 04/13/2015   LDLCALC 78 04/13/2015   ALT 20 11/07/2016   AST 26 11/07/2016   NA 139 11/07/2016   K 4.3 11/07/2016   CL 106 11/07/2016   CREATININE 0.73 11/07/2016   BUN 10 11/07/2016   CO2 24 11/07/2016   TSH 1.65 04/13/2015   INR 1.0 11/07/2016   HGBA1C 5.8 08/14/2015    Mr Lumbar Spine Wo Contrast  Result Date: 11/21/2016 CLINICAL DATA:  Back pain.  Hepatocellular carcinoma. EXAM: MRI LUMBAR SPINE WITHOUT CONTRAST TECHNIQUE: Multiplanar, multisequence MR imaging of the lumbar spine was performed. No intravenous contrast was administered. 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 This will be the numbering strategy used on today's exam. Alignment: 5 mm degenerative anterolisthesis at L3-4. 5 mm degenerative anterolisthesis at L4-5. No pars defects identified. Vertebrae: Bilateral sacroiliitis. Mild degenerative endplate findings at [id number]. Disc desiccation  most notable at L2-3 and L4-5. Conus medullaris: Extends to the T12 level and appears normal. Paraspinal and other soft tissues: Uterine fibroids or suspected based on the parasagittal images. Sigmoid colon diverticulosis. Disc levels: T12-L1: Unremarkable. L1-2:  Mild left foraminal stenosis due to facet arthropathy. L2-3: Moderate to prominent central narrowing of the thecal sac with moderate to prominent right and mild left foraminal stenosis and mild bilateral subarticular lateral recess stenosis due to facet arthropathy, right facet effusion, and disc bulge. L3-4: Moderate right and mild left foraminal stenosis and mild central narrowing of the thecal sac due to disc uncovering and facet arthropathy. L4-5: Mild left foraminal stenosis due to disc uncovering and facet arthropathy. L5-S1: No impingement. There is only very rudimentary disc material at this level. IMPRESSION: 1. Lumbar spondylosis and degenerative disc disease, causing moderate to prominent impingement at L2- 3; moderate impingement at L3-4 ; and mild impingement at L1- 2 and L4-5, as detailed above. 2. Uterine fibroids. 3. Sigmoid colon diverticulosis. 4. Bilateral chronic appearing  sacroiliitis. 5. 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 Electronically Signed   By: Van Clines M.D.   On: 11/21/2016 17:17    Assessment & Plan:   There are no diagnoses linked to this encounter. I am having Ms. Roacho maintain her Cholecalciferol, multivitamin with minerals, ondansetron, furosemide, loratadine, losartan, pantoprazole, potassium chloride SA, BLACK COHOSH PO, naproxen sodium, aspirin EC, oxyCODONE, and phentermine.  No orders of the defined types were placed in this encounter.    Follow-up: No Follow-up on file.  Walker Kehr, MD

## 2016-12-10 NOTE — Assessment & Plan Note (Signed)
Hep C treated

## 2016-12-17 ENCOUNTER — Encounter: Payer: Self-pay | Admitting: Interventional Radiology

## 2016-12-19 ENCOUNTER — Ambulatory Visit
Admission: RE | Admit: 2016-12-19 | Discharge: 2016-12-19 | Disposition: A | Discharge status: Home / Self Care | Payer: Medicare Other | Source: Ambulatory Visit | Attending: Internal Medicine | Admitting: Internal Medicine

## 2016-12-19 ENCOUNTER — Other Ambulatory Visit: Payer: Self-pay | Admitting: Diagnostic Radiology

## 2016-12-19 DIAGNOSIS — G8929 Other chronic pain: Secondary | ICD-10-CM

## 2016-12-19 DIAGNOSIS — M545 Low back pain: Principal | ICD-10-CM

## 2016-12-19 DIAGNOSIS — M48061 Spinal stenosis, lumbar region without neurogenic claudication: Secondary | ICD-10-CM | POA: Diagnosis not present

## 2016-12-19 MED ORDER — METHYLPREDNISOLONE ACETATE 40 MG/ML INJ SUSP (RADIOLOG
120.0000 mg | Freq: Once | INTRAMUSCULAR | Status: AC
  Administered 2016-12-19: 120 mg via EPIDURAL

## 2016-12-19 MED ORDER — IOPAMIDOL (ISOVUE-M 200) INJECTION 41%
1.0000 mL | Freq: Once | INTRAMUSCULAR | Status: AC
  Administered 2016-12-19: 1 mL via EPIDURAL

## 2016-12-19 NOTE — Discharge Instructions (Signed)

## 2016-12-29 DIAGNOSIS — K7460 Unspecified cirrhosis of liver: Secondary | ICD-10-CM

## 2016-12-29 HISTORY — DX: Unspecified cirrhosis of liver: K74.60

## 2016-12-30 DIAGNOSIS — K7460 Unspecified cirrhosis of liver: Secondary | ICD-10-CM | POA: Diagnosis not present

## 2016-12-30 DIAGNOSIS — R1011 Right upper quadrant pain: Secondary | ICD-10-CM | POA: Diagnosis not present

## 2017-01-28 ENCOUNTER — Ambulatory Visit: Payer: Medicare Other | Admitting: Internal Medicine

## 2017-01-30 ENCOUNTER — Ambulatory Visit (INDEPENDENT_AMBULATORY_CARE_PROVIDER_SITE_OTHER): Payer: Medicare Other | Admitting: Internal Medicine

## 2017-01-30 ENCOUNTER — Encounter: Payer: Self-pay | Admitting: Internal Medicine

## 2017-01-30 DIAGNOSIS — I10 Essential (primary) hypertension: Secondary | ICD-10-CM | POA: Diagnosis not present

## 2017-01-30 DIAGNOSIS — K7460 Unspecified cirrhosis of liver: Secondary | ICD-10-CM

## 2017-01-30 DIAGNOSIS — R739 Hyperglycemia, unspecified: Secondary | ICD-10-CM

## 2017-01-30 DIAGNOSIS — C220 Liver cell carcinoma: Secondary | ICD-10-CM | POA: Diagnosis not present

## 2017-01-30 DIAGNOSIS — M5440 Lumbago with sciatica, unspecified side: Secondary | ICD-10-CM

## 2017-01-30 DIAGNOSIS — G8929 Other chronic pain: Secondary | ICD-10-CM

## 2017-01-30 MED ORDER — LOSARTAN POTASSIUM 100 MG PO TABS: 100.0000 mg | ORAL_TABLET | Freq: Every day | ORAL | 3 refills | Status: DC

## 2017-01-30 MED ORDER — FUROSEMIDE 20 MG PO TABS: 20.0000 mg | ORAL_TABLET | Freq: Every day | ORAL | 3 refills | Status: DC

## 2017-01-30 MED ORDER — LORATADINE 10 MG PO TABS: 10.0000 mg | ORAL_TABLET | Freq: Every day | ORAL | 3 refills | Status: DC

## 2017-01-30 MED ORDER — MELOXICAM 7.5 MG PO TABS: 7.5000 mg | ORAL_TABLET | Freq: Every day | ORAL | 1 refills | Status: DC | PRN

## 2017-01-30 MED ORDER — POTASSIUM CHLORIDE CRYS ER 20 MEQ PO TBCR: 20.0000 meq | EXTENDED_RELEASE_TABLET | Freq: Every day | ORAL | 3 refills | Status: DC

## 2017-01-30 MED ORDER — PANTOPRAZOLE SODIUM 40 MG PO TBEC: 40.0000 mg | DELAYED_RELEASE_TABLET | Freq: Every day | ORAL | 3 refills | Status: DC

## 2017-01-30 NOTE — Assessment & Plan Note (Signed)
Losartan, Norvasc, Lasix 

## 2017-01-30 NOTE — Progress Notes (Signed)
Subjective:  Patient ID: Samantha [name], female    DOB: 1951-08-08  Age: 66 y.o. MRN: [id number]  CC: No chief complaint on file.   HPI Samantha [name] presents for LBP - better w/shots, OA, HTN, obesity and live CA f/u  Outpatient Medications Prior to Visit  Medication Sig Dispense Refill  . aspirin EC 81 MG tablet Take 81 mg by mouth daily.    Marland Kitchen BLACK COHOSH PO Take 1 tablet by mouth daily.    . Cholecalciferol (EQL VITAMIN D3) 1000 UNITS tablet Take 1,000 Units by mouth daily.     . furosemide (LASIX) 20 MG tablet Take 1 tablet (20 mg total) by mouth daily. 90 tablet 3  . loratadine (CLARITIN) 10 MG tablet Take 1 tablet (10 mg total) by mouth daily. 100 tablet 3  . losartan (COZAAR) 100 MG tablet Take 1 tablet (100 mg total) by mouth daily. 90 tablet 3  . meloxicam (MOBIC) 7.5 MG tablet Take 1-2 tablets (7.5-15 mg total) by mouth daily as needed for pain. 60 tablet 1  . Multiple Vitamin (MULTIVITAMIN WITH MINERALS) TABS tablet Take 1 tablet by mouth daily.     . naproxen sodium (ALEVE) 220 MG tablet Take 220 mg by mouth every 8 (eight) hours as needed (For pain.).    Marland Kitchen ondansetron (ZOFRAN) 8 MG tablet Take 8 mg by mouth every 8 (eight) hours as needed for nausea or vomiting. Reported on 07/03/2016    . oxyCODONE (OXY IR/ROXICODONE) 5 MG immediate release tablet Take 1 tablet (5 mg total) by mouth every 6 (six) hours as needed (For pain.). 90 tablet 0  . pantoprazole (PROTONIX) 40 MG tablet Take 1 tablet (40 mg total) by mouth daily. 90 tablet 3  . phentermine (ADIPEX-P) 37.5 MG tablet Take 1 tablet (37.5 mg total) by mouth daily before breakfast. 30 tablet 2  . potassium chloride SA (K-DUR,KLOR-CON) 20 MEQ tablet Take 1 tablet (20 mEq total) by mouth daily. 90 tablet 3   No facility-administered medications prior to visit.     ROS Review of Systems  Constitutional: Negative for activity change, appetite change, chills, fatigue and unexpected weight change.  HENT: Negative for  congestion, mouth sores and sinus pressure.   Eyes: Negative for visual disturbance.  Respiratory: Negative for cough and chest tightness.   Gastrointestinal: Negative for abdominal pain and nausea.  Genitourinary: Negative for difficulty urinating, frequency and vaginal pain.  Musculoskeletal: Positive for back pain. Negative for gait problem.  Skin: Negative for pallor and rash.  Neurological: Negative for dizziness, tremors, weakness, numbness and headaches.  Psychiatric/Behavioral: Negative for confusion and sleep disturbance.    Objective:  BP 130/72   Pulse 80   Temp 98.2 F (36.8 C) (Oral)   Resp 16   Ht 5\' 3"  (1.6 m)   Wt 285 lb 6.4 oz (129.5 kg)   BMI 50.56 kg/m   BP Readings from Last 3 Encounters:  01/30/17 130/72  12/19/16 (!) 171/98  12/10/16 (!) 138/96    Wt Readings from Last 3 Encounters:  01/30/17 285 lb 6.4 oz (129.5 kg)  12/10/16 288 lb (130.6 kg)  10/28/16 295 lb (133.8 kg)    Physical Exam  Constitutional: She appears well-developed. No distress.  HENT:  Head: Normocephalic.  Right Ear: External ear normal.  Left Ear: External ear normal.  Nose: Nose normal.  Mouth/Throat: Oropharynx is clear and moist.  Eyes: Conjunctivae are normal. Pupils are equal, round, and reactive to light. Right eye exhibits no  discharge. Left eye exhibits no discharge.  Neck: Normal range of motion. Neck supple. No JVD present. No tracheal deviation present. No thyromegaly present.  Cardiovascular: Normal rate, regular rhythm and normal heart sounds.   Pulmonary/Chest: No stridor. No respiratory distress. She has no wheezes.  Abdominal: Soft. Bowel sounds are normal. She exhibits no distension and no mass. There is no tenderness. There is no rebound and no guarding.  Musculoskeletal: She exhibits no edema or tenderness.  Lymphadenopathy:    She has no cervical adenopathy.  Neurological: She displays normal reflexes. No cranial nerve deficit. She exhibits normal muscle  tone. Coordination normal.  Skin: No rash noted. No erythema.  Psychiatric: She has a normal mood and affect. Her behavior is normal. Judgment and thought content normal.  RUQ is sensitive LS NT Obese  Lab Results  Component Value Date   WBC 4.2 11/07/2016   HGB 13.2 11/07/2016   HCT 41.8 11/07/2016   PLT 214 11/07/2016   GLUCOSE 100 (H) 11/07/2016   CHOL 145 04/13/2015   TRIG 83.0 04/13/2015   HDL 50.00 04/13/2015   LDLCALC 78 04/13/2015   ALT 20 11/07/2016   AST 26 11/07/2016   NA 139 11/07/2016   K 4.3 11/07/2016   CL 106 11/07/2016   CREATININE 0.73 11/07/2016   BUN 10 11/07/2016   CO2 24 11/07/2016   TSH 1.65 04/13/2015   INR 1.0 11/07/2016   HGBA1C 5.8 08/14/2015    Dg Inject Diag/thera/inc Needle/cath/plc Epi/lumb/sac W/img  Result Date: 12/19/2016 CLINICAL DATA:  Lumbosacral spondylosis without myelopathy. Low back pain radiating into both lower extremities and into the feet, right worse than left. Moderate to severe spinal stenosis at L2-3 on MRI (with sacralized L5 transitional anatomy). FLUOROSCOPY TIME:  Radiation Exposure Index (as provided by the fluoroscopic device): 95.50 microGray*m^2 Fluoroscopy Time (in minutes and seconds):  17 seconds PROCEDURE: The procedure, risks, benefits, and alternatives were explained to the patient. Questions regarding the procedure were encouraged and answered. The patient understands and consents to the procedure. LUMBAR EPIDURAL INJECTION: An interlaminar approach was performed on the right at lumbar level. The overlying skin was cleansed and anesthetized. A 20 gauge epidural needle was advanced using loss-of-resistance technique. DIAGNOSTIC EPIDURAL INJECTION: Injection of Isovue-M 200 shows a good epidural pattern with spread above and below the level of needle placement including up to L2-3, primarily on the right. No vascular opacification is seen. THERAPEUTIC EPIDURAL INJECTION: 120 mg of Depo-Medrol mixed with 3 mL of 1%  lidocaine were instilled. The procedure was well-tolerated, and the patient was discharged thirty minutes following the injection in good condition. COMPLICATIONS: None IMPRESSION: Technically successful lumbar interlaminar epidural injection on the right at L3-4. Electronically Signed   By: Logan Bores M.D.   On: 12/19/2016 11:03    Assessment & Plan:   Diagnoses and all orders for this visit:  Morbid obesity, unspecified obesity type ([place]) -     potassium chloride SA (K-DUR,KLOR-CON) 20 MEQ tablet; Take 1 tablet (20 mEq total) by mouth daily. -     losartan (COZAAR) 100 MG tablet; Take 1 tablet (100 mg total) by mouth daily. -     furosemide (LASIX) 20 MG tablet; Take 1 tablet (20 mg total) by mouth daily.  Other orders -     pantoprazole (PROTONIX) 40 MG tablet; Take 1 tablet (40 mg total) by mouth daily. -     meloxicam (MOBIC) 7.5 MG tablet; Take 1-2 tablets (7.5-15 mg total) by mouth daily as needed  for pain. -     loratadine (CLARITIN) 10 MG tablet; Take 1 tablet (10 mg total) by mouth daily.   I am having Samantha [name] maintain her Cholecalciferol, multivitamin with minerals, ondansetron, furosemide, loratadine, losartan, pantoprazole, potassium chloride SA, BLACK COHOSH PO, naproxen sodium, aspirin EC, phentermine, oxyCODONE, and meloxicam.  No orders of the defined types were placed in this encounter.    Follow-up: No Follow-up on file.  Walker Kehr, MD

## 2017-01-30 NOTE — Assessment & Plan Note (Signed)
Labs are due in March

## 2017-01-30 NOTE — Assessment & Plan Note (Signed)
Wt Readings from Last 3 Encounters:  01/30/17 285 lb 6.4 oz (129.5 kg)  12/10/16 288 lb (130.6 kg)  10/28/16 295 lb (133.8 kg)

## 2017-01-30 NOTE — Assessment & Plan Note (Signed)
Better w/shots

## 2017-01-30 NOTE — Assessment & Plan Note (Signed)
Labs

## 2017-01-30 NOTE — Assessment & Plan Note (Signed)
Treated by IR

## 2017-01-30 NOTE — Progress Notes (Signed)
Pre visit review using our clinic review tool, if applicable. No additional management support is needed unless otherwise documented below in the visit note. 

## 2017-01-31 IMAGING — CT CT CHEST W/O CM
3 of 4 series · 17 of 30 positions shown, 19 images · non-contrast
Comparison: 01/17/2016

CLINICAL DATA: Follow-up lung nodule

EXAM:
CT CHEST WITHOUT CONTRAST
TECHNIQUE: Multidetector CT imaging of the chest was performed following the
standard protocol without IV contrast.

[Series 3: chest w/o · axial · non-contrast · 0.84mm/px · z∈[-279,-89]mm · 6 of 108 slices shown, 8 images]
[im 16/108  mediastinal]
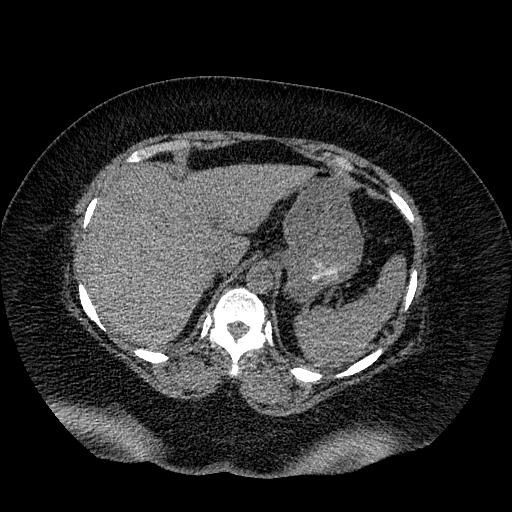
[im 16/108  lung]
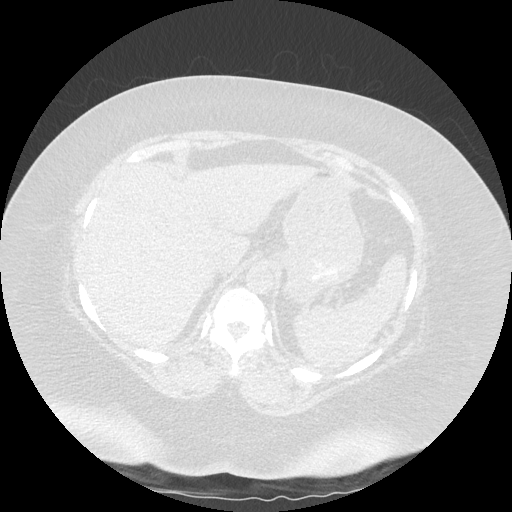
[im 31/108  lung]
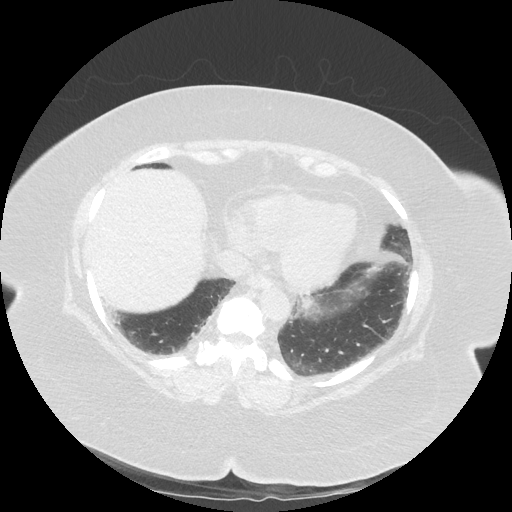
[im 46/108  lung]
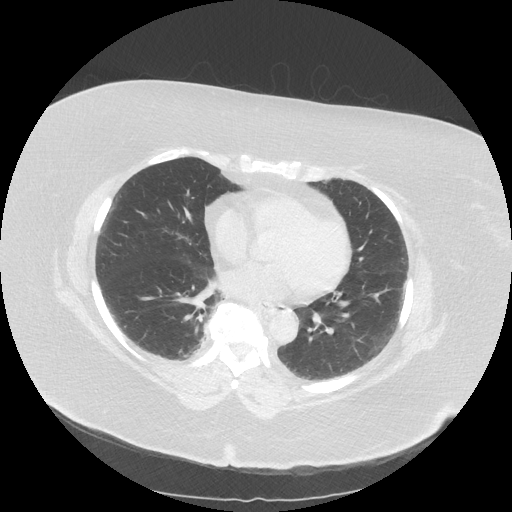
[im 62/108  lung]
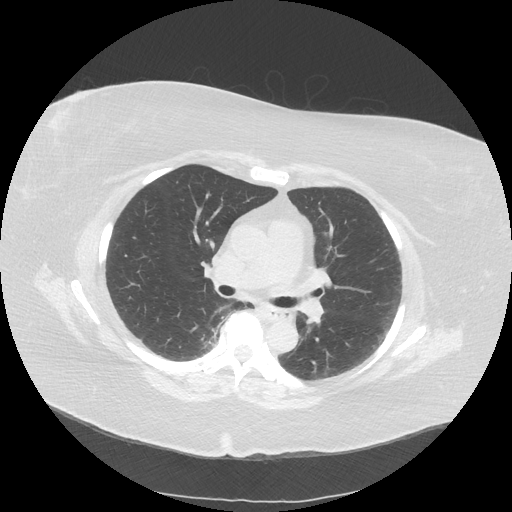
[im 77/108  mediastinal]
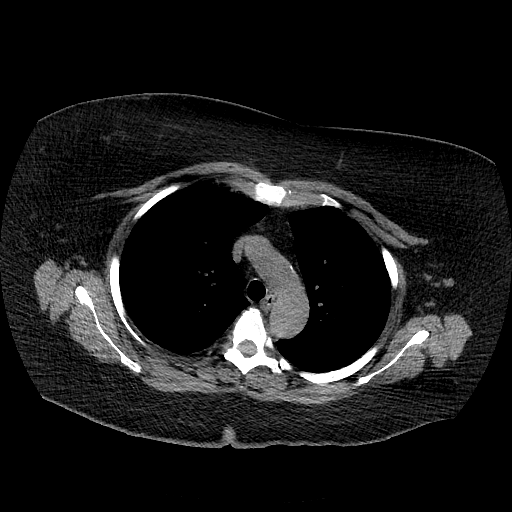
[im 77/108  lung]
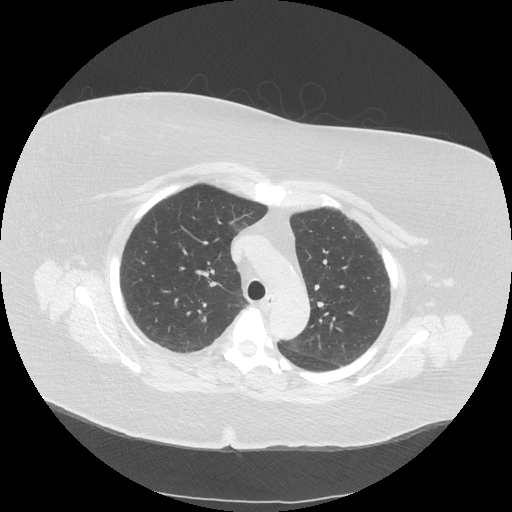
[im 92/108  lung]
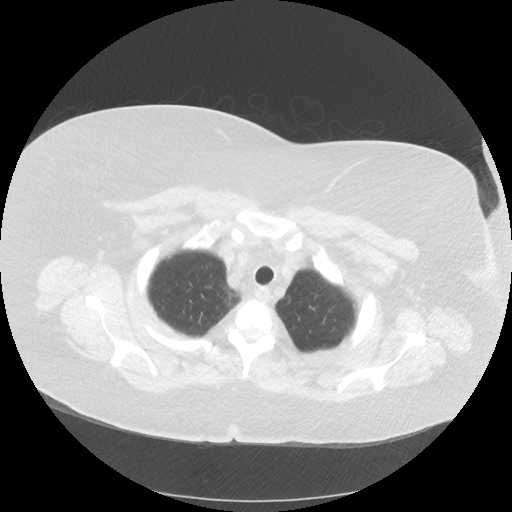

[Series 4: lung windows · axial · 0.84mm/px · z∈[-279,-89]mm · 6 of 108 slices shown]
[im 16/108  lung]
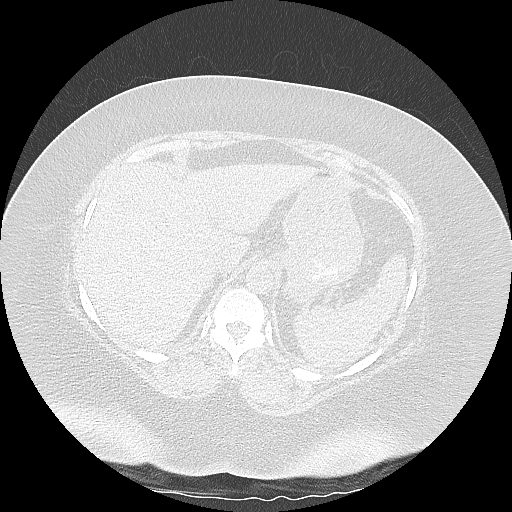
[im 31/108  lung]
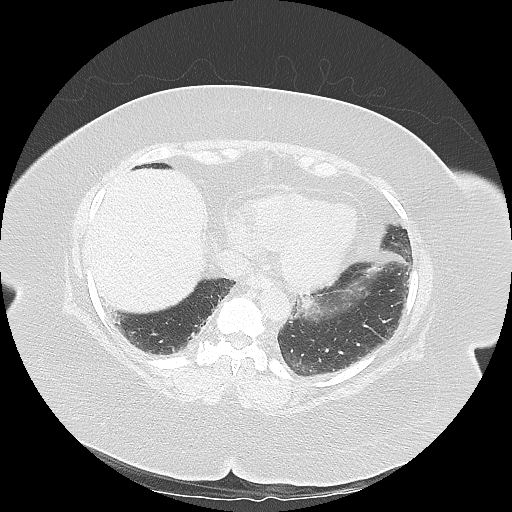
[im 46/108  lung]
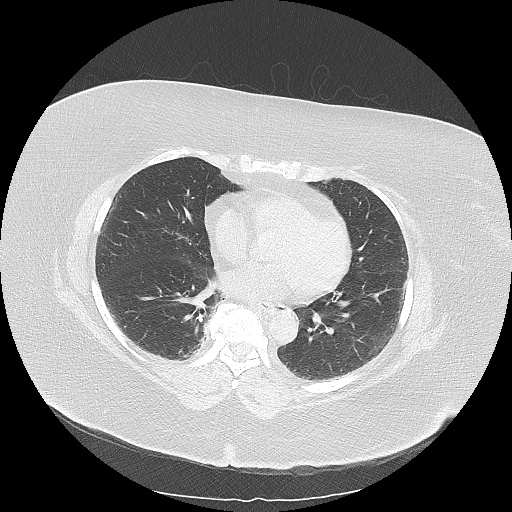
[im 62/108  lung]
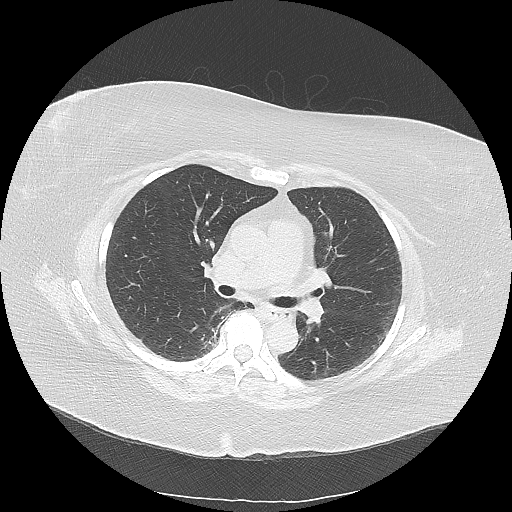
[im 77/108  lung]
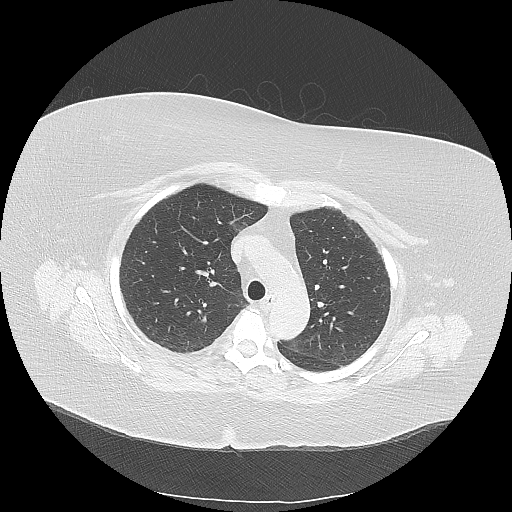
[im 92/108  lung]
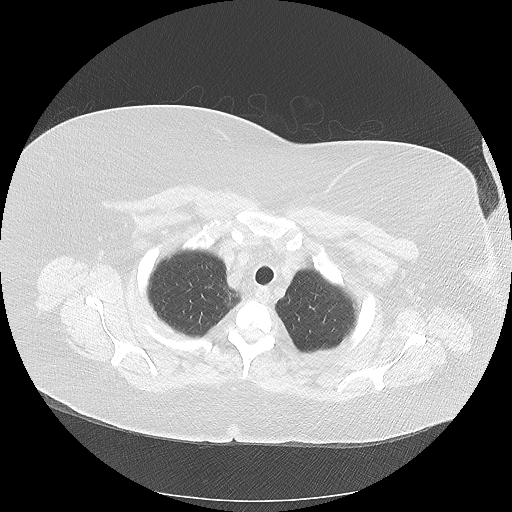

[Series 602: sagittal body · sagittal · 0.84mm/px · 5 of 172 slices shown]
[im 16/172  mediastinal]
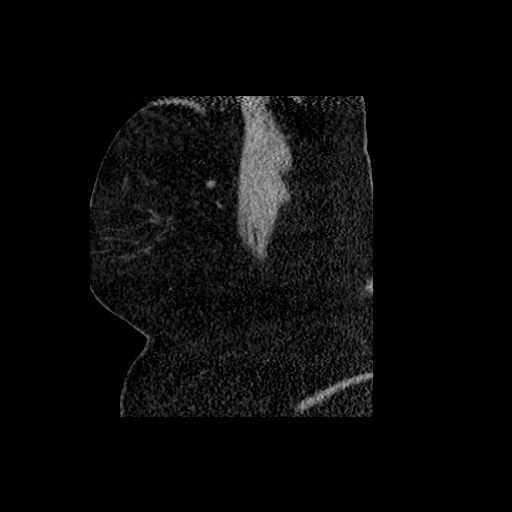
[im 32/172  mediastinal]
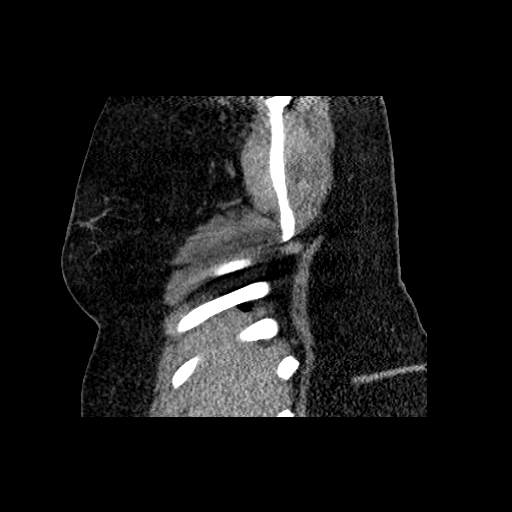
[im 63/172  mediastinal]
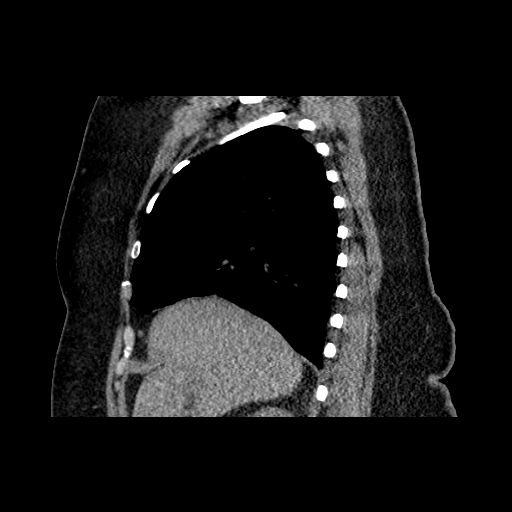
[im 78/172  mediastinal]
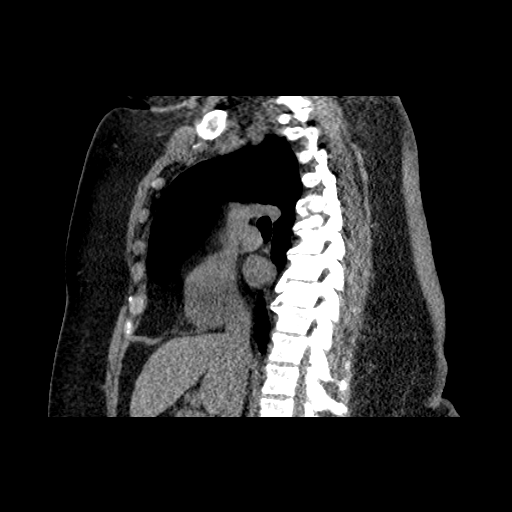
[im 94/172  mediastinal]
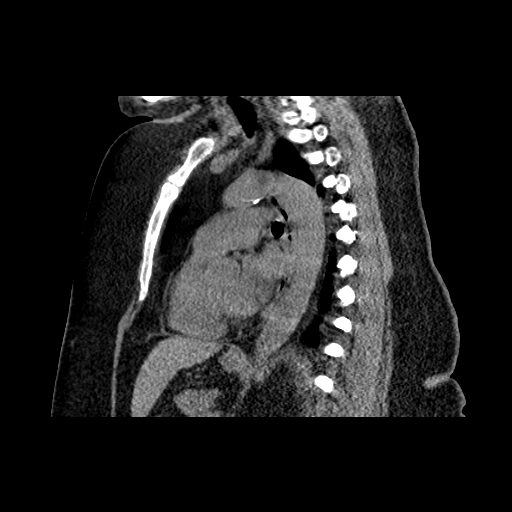

[17 of 30 positions shown; findings below may reference images not displayed]

FINDINGS: Cardiovascular: Heart size within normal limits. No pericardial
effusion. Atherosclerotic calcifications of thoracic aorta.

Mediastinum/Nodes: No mediastinal hematoma or adenopathy. No hilar
adenopathy.

Lungs/Pleura: Images of the lung parenchyma shows no acute
infiltrate or pleural effusion. No pulmonary edema. Mild atelectasis
noted in right lower lobe posterior medially. Minimal atelectasis in
right upper lobe posteriorly.

Axial image 51 there is stable 3 mm nodule in right middle lobe
posteriorly. No new pulmonary nodules are noted.

Upper Abdomen: Again noted micro nodular liver contour there is
hyperdense probable post embolization lesion in right hepatic lobe
anteriorly measures 2.9 cm.

Musculoskeletal: No destructive bony lesions are noted. Sagittal
images of the spine shows degenerative changes thoracic spine.
Sagittal view of the sternum is unremarkable.
IMPRESSION: 1. No infiltrate or pulmonary edema.
2. No mediastinal hematoma or adenopathy.
3. Stable 3 mm nodule in posterior aspect of the right middle lobe.
No follow-up needed if patient is low-risk. Non-contrast chest CT
can be considered in 12 months if patient is high-risk. This
recommendation follows the consensus statement: Guidelines for
Management of Incidental Pulmonary Nodules Detected on CT
Images:From the [HOSPITAL] 4611; published online before
print (10.1148/[id number]). No new pulmonary nodule is noted.
4. There is 2.9 cm hyperdense probable post embolization lesion in
right hepatic lobe. This has decreased in size from prior exam when
measured 4.9 cm.

## 2017-03-24 ENCOUNTER — Other Ambulatory Visit: Payer: Self-pay | Admitting: *Deleted

## 2017-03-24 ENCOUNTER — Other Ambulatory Visit (HOSPITAL_COMMUNITY): Payer: Self-pay | Admitting: Interventional Radiology

## 2017-03-24 DIAGNOSIS — C220 Liver cell carcinoma: Secondary | ICD-10-CM

## 2017-03-27 DIAGNOSIS — C220 Liver cell carcinoma: Secondary | ICD-10-CM | POA: Diagnosis not present

## 2017-03-27 LAB — CBC
HEMATOCRIT: 41.5 % (ref 35.0–45.0)
Hemoglobin: 12.9 g/dL (ref 11.7–15.5)
MCH: 26 pg — ABNORMAL LOW (ref 27.0–33.0)
MCHC: 31.1 g/dL — ABNORMAL LOW (ref 32.0–36.0)
MCV: 83.7 fL (ref 80.0–100.0)
MPV: 10.5 fL (ref 7.5–12.5)
PLATELETS: 195 10*3/uL (ref 140–400)
RBC: 4.96 MIL/uL (ref 3.80–5.10)
RDW: 14.5 % (ref 11.0–15.0)
WBC: 4.3 10*3/uL (ref 3.8–10.8)

## 2017-03-27 LAB — PROTIME-INR
INR: 1
Prothrombin Time: 10.7 s (ref 9.0–11.5)

## 2017-03-27 LAB — COMPLETE METABOLIC PANEL WITH GFR
ALBUMIN: 3.7 g/dL (ref 3.6–5.1)
ALK PHOS: 91 U/L (ref 33–130)
ALT: 10 U/L (ref 6–29)
AST: 16 U/L (ref 10–35)
BILIRUBIN TOTAL: 0.3 mg/dL (ref 0.2–1.2)
BUN: 15 mg/dL (ref 7–25)
CO2: 24 mmol/L (ref 20–31)
CREATININE: 0.88 mg/dL (ref 0.50–0.99)
Calcium: 9.5 mg/dL (ref 8.6–10.4)
Chloride: 109 mmol/L (ref 98–110)
GFR, EST NON AFRICAN AMERICAN: 68 mL/min (ref >=60)
GFR, Est African American: 79 mL/min (ref >=60)
GLUCOSE: 115 mg/dL — AB (ref 65–99)
Potassium: 4.3 mmol/L (ref 3.5–5.3)
SODIUM: 143 mmol/L (ref 135–146)
Total Protein: 6.7 g/dL (ref 6.1–8.1)

## 2017-04-03 ENCOUNTER — Encounter (HOSPITAL_COMMUNITY): Payer: Self-pay | Admitting: Radiology

## 2017-04-03 ENCOUNTER — Ambulatory Visit (HOSPITAL_COMMUNITY)
Admission: RE | Admit: 2017-04-03 | Discharge: 2017-04-03 | Disposition: A | Discharge status: Home / Self Care | Payer: Medicare Other | Source: Ambulatory Visit | Attending: Interventional Radiology | Admitting: Interventional Radiology

## 2017-04-03 DIAGNOSIS — C220 Liver cell carcinoma: Secondary | ICD-10-CM | POA: Insufficient documentation

## 2017-04-03 DIAGNOSIS — K7460 Unspecified cirrhosis of liver: Secondary | ICD-10-CM | POA: Insufficient documentation

## 2017-04-03 MED ORDER — GADOXETATE DISODIUM 0.25 MMOL/ML IV SOLN
10.0000 mL | Freq: Once | INTRAVENOUS | Status: AC | PRN
  Administered 2017-04-03: 10 mL via INTRAVENOUS

## 2017-04-09 ENCOUNTER — Ambulatory Visit
Admission: RE | Admit: 2017-04-09 | Discharge: 2017-04-09 | Disposition: A | Discharge status: Home / Self Care | Payer: Medicare Other | Source: Ambulatory Visit | Attending: Interventional Radiology | Admitting: Interventional Radiology

## 2017-04-09 DIAGNOSIS — C220 Liver cell carcinoma: Secondary | ICD-10-CM | POA: Diagnosis not present

## 2017-04-09 HISTORY — PX: IR RADIOLOGIST EVAL & MGMT: IMG5224

## 2017-04-09 NOTE — Progress Notes (Signed)
Chief Complaint: Hepatocellular cancer S/P Lipiodol transarterial chemo embolization on 2/23 and then subsequent CT guided thermal ablation of this lesion on 03/21/2016 by Dr. Laurence Ferrari  Referring Physician(s): Truitt Merle  Supervising Physician: [name]  History of Present Illness: Samantha [name] is a 67 y.o. 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  She underwent conventional lipiodol transarterial chemo embolization on 2/23 and then subsequent CT guided thermal ablation of this lesion on 03/21/2016.  She presents today for 1 year follow up. She had MRI today and and labs drawn 03/27/2017.  She is doing very well today and has no complaints.  When she was here last she was c/o some back pain with radiation down the buttock, thigh, and back of her leg.    She had been taking Oxycodone and [facility] had [facility]  She was seen on 12/19/2016 at the [facility] and underwent lumbar interlaminar epidural injection on the right at L3-4 by Dr. Jeralyn Ruths.  Her pain has completely resolved.   Past Medical History:  Diagnosis Date  . Allergy    seasonal  . Cancer ([place]) 2017   liver cancer  . Elevated glucose    no longer an issue 03-18-16  . GERD (gastroesophageal reflux disease)   . H/O seasonal allergies   . Hepatitis C    hepatitis c completed march 2017  . Hyperlipidemia 2011  . Hypertension   . LBP (low back pain)   . Obesity   . Osteoarthritis    B knees    Past Surgical History:  Procedure Laterality Date  . CHOLECYSTECTOMY    . COLONOSCOPY N/A 01/09/2015   Procedure: COLONOSCOPY;  Surgeon: Jerene Bears, MD;  Location: [facility];  Service: Gastroenterology;  Laterality: N/A;  . ESOPHAGOGASTRODUODENOSCOPY (EGD) WITH PROPOFOL N/A 06/10/2016   Procedure: ESOPHAGOGASTRODUODENOSCOPY (EGD) WITH PROPOFOL;  Surgeon: Jerene Bears, MD;  Location: [facility];  Service: Gastroenterology;  Laterality: N/A;  . IR GENERIC HISTORICAL  07/03/2016   IR RADIOLOGIST EVAL & MGMT 07/03/2016 [name], MD [facility] INTER[facility] RAD  . IR GENERIC HISTORICAL  11/18/2016   IR RADIOLOGIST EVAL & MGMT 11/18/2016 [name], MD [facility] INTER[facility] RAD  . JOINT REPLACEMENT    . left shoulder arthroplasty    . right knee replacement  2007  . total knee replacement L  03-13-08  . TUBAL LIGATION      Allergies: Amlodipine; Benazepril hcl; and Codeine  Medications: Prior to Admission medications   Medication Sig Start Date End Date Taking? Authorizing Provider  aspirin EC 81 MG tablet Take 81 mg by mouth daily.   Yes Historical Provider, MD  BLACK COHOSH PO Take 1 tablet by mouth daily.   Yes Historical Provider, MD  Cholecalciferol (EQL VITAMIN D3) 1000 UNITS tablet Take 1,000 Units by mouth daily.    Yes Historical Provider, MD  furosemide (LASIX) 20 MG tablet Take 1 tablet (20 mg total) by mouth daily. 01/30/17  Yes Aleksei Plotnikov V, MD  loratadine (CLARITIN) 10 MG tablet Take 1 tablet (10 mg total) by mouth daily. 01/30/17  Yes Aleksei Plotnikov V, MD  losartan (COZAAR) 100 MG tablet Take 1 tablet (100 mg total) by mouth daily. 01/30/17  Yes Aleksei Plotnikov V, MD  meloxicam (MOBIC) 7.5 MG tablet Take 1-2 tablets (7.5-15 mg total) by mouth daily as needed for pain. 01/30/17  Yes Cassandria Anger, MD  Multiple Vitamin (MULTIVITAMIN WITH MINERALS) TABS tablet Take 1 tablet by mouth daily.    Yes Historical Provider, MD  naproxen sodium (ALEVE) 220 MG tablet Take 220 mg by mouth every 8 (eight) hours as needed (For pain.).   Yes Historical Provider, MD  ondansetron (ZOFRAN) 8 MG tablet Take 8 mg by mouth every 8 (eight) hours as needed for nausea or vomiting. Reported on 07/03/2016 02/22/16  Yes Historical Provider, MD  pantoprazole (PROTONIX) 40 MG tablet Take 1 tablet (40 mg total) by mouth daily. 01/30/17  Yes Aleksei Plotnikov V, MD    potassium chloride SA (K-DUR,KLOR-CON) 20 MEQ tablet Take 1 tablet (20 mEq total) by mouth daily. 01/30/17  Yes Aleksei Plotnikov V, MD  oxyCODONE (OXY IR/ROXICODONE) 5 MG immediate release tablet Take 1 tablet (5 mg total) by mouth every 6 (six) hours as needed (For pain.). Patient not taking: Reported on 04/09/2017 12/10/16   Cassandria Anger, MD  phentermine (ADIPEX-P) 37.5 MG tablet Take 1 tablet (37.5 mg total) by mouth daily before breakfast. 10/28/16 01/30/17  Cassandria Anger, MD     Family History  Problem Relation Age of Onset  . Hypertension Mother   . Ovarian cancer Mother   . Hypertension Father   . Cancer Father 57    prostate cancer   . Diabetes Other   . Hypertension Other   . Colon cancer Neg Hx   . Esophageal cancer Neg Hx   . Rectal cancer Neg Hx   . Stomach cancer Neg Hx     Social History   Social History  . Marital status: Single    Spouse name: N/A  . Number of children: 4  . Years of education: N/A   Social History Main Topics  . Smoking status: Former Smoker    Packs/day: 0.25    Years: 20.00    Quit date: 12/30/2007  . Smokeless tobacco: Never Used  . 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 Drug use: No  . Sexual activity: Not Currently   Other Topics Concern  . Not on file   Social History Narrative   Regular exercise: walk a few times a week   Caffeine use: none   Single, raising her young great granddaughter and watches grandchildren after school   Has a room mate to help prn   Retired from nursing tech--worked at [employer]: A 12 point ROS discussed Review of Systems  Constitutional: Negative.   HENT: Negative.   Respiratory: Negative.   Cardiovascular: Negative.   Gastrointestinal: Negative.   Genitourinary: Negative.   Musculoskeletal: Negative.   Skin: Negative.   Neurological: Negative.    Hematological: Negative.   Psychiatric/Behavioral: Negative.     Vital Signs: BP 137/82 (BP Location: Left Arm, Patient Position: Sitting, Cuff Size: Large)   Pulse 96   Temp 98.2 F (36.8 C) (Oral)   Resp 17   Ht 5\' 3"  (1.6 m)   Wt 273 lb (123.8 kg)   SpO2 93%   BMI 48.36 kg/m   Physical Exam  Constitutional: She is oriented to person, place, and time.  Obese,NAD  HENT:  Head: Normocephalic and atraumatic.  Eyes: EOM are normal.  Neck: Normal range of motion.  Cardiovascular: Normal rate, regular rhythm and normal heart sounds.   No murmur heard. Pulmonary/Chest: Effort normal and breath sounds  normal. No respiratory distress. She has no wheezes.  Abdominal: Soft. There is tenderness.    Mild tenderness to palpation.   Musculoskeletal: Normal range of motion.  Neurological: She is alert and oriented to person, place, and time.  Skin: Skin is warm.  Psychiatric: She has a normal mood and affect. Her behavior is normal. Judgment and thought content normal.  Vitals reviewed.   Imaging: Mr Abdomen Wwo Contrast  Result Date: 04/03/2017 CLINICAL DATA:  Status post ablation of hepatoma EXAM: MRI ABDOMEN WITHOUT AND WITH CONTRAST TECHNIQUE: Multiplanar multisequence MR imaging of the abdomen was performed both before and after the administration of intravenous contrast. CONTRAST:  10 cc Eovist COMPARISON:  11/18/2016.  07/03/2016. FINDINGS: Lower chest:  Unremarkable. Hepatobiliary: Nodular liver contour again noted. Ablation defect anterior right liver (segment VIII) measures 2.4 x 2.9 cm today, unchanged. The lesion has some heterogeneous T1 shortening on precontrast imaging likely related to blood products in the ablation bed. After IV contrast administration, no unexpected enhancement and the lesion or around the lesion to suggest local recurrence. No hypervascular lesion in the liver on today's study. Gallbladder surgically absent. No intrahepatic or extrahepatic biliary  dilation. Pancreas: No focal mass lesion. No dilatation of the main duct. No intraparenchymal cyst. No peripancreatic edema. Spleen: No splenomegaly. No focal mass lesion. Adrenals/Urinary Tract: No adrenal nodule or mass. Kidneys unremarkable. Stomach/Bowel: Stomach is nondistended. No gastric wall thickening. No evidence of outlet obstruction. No evidence for bowel obstruction. Vascular/Lymphatic: No abdominal aortic aneurysm. There is no gastrohepatic or hepatoduodenal ligament lymphadenopathy. No intraperitoneal or retroperitoneal lymphadenopathy. Other: No intraperitoneal free fluid. Musculoskeletal: No abnormal marrow signal within the visualized bony anatomy. IMPRESSION: 1. Stable ablation defect segment VIII of the liver. No findings today to suggest recurrent disease. 2. Cirrhosis. Electronically Signed   By: Misty Stanley M.D.   On: 04/03/2017 16:28    Labs:  CBC:  Recent Labs  06/02/16 1405 09/17/16 1527 11/07/16 1324 03/27/17 1316  WBC 4.7 4.9 4.2 4.3  HGB 12.6 13.7 13.2 12.9  HCT 40.5 42.0 41.8 41.5  PLT 202 209.0 214 195    COAGS:  Recent Labs  06/02/16 1405 11/07/16 1324 03/27/17 1316  INR 1.03 1.0 1.0    BMP:  Recent Labs  06/02/16 1405 09/17/16 1527 11/07/16 1324 03/27/17 1316  NA 140 140 139 143  K 4.1 4.1 4.3 4.3  CL 105 105 106 109  CO2 26 28 24 24   GLUCOSE 93 102* 100* 115*  BUN 15 17 10 15   CALCIUM 9.0 9.4 9.2 9.5  CREATININE 0.81 0.94 0.73 0.88  GFRNONAA  --   --   --  68  GFRAA  --   --   --  79    LIVER FUNCTION TESTS:  Recent Labs  06/02/16 1405 09/17/16 1527 11/07/16 1324 03/27/17 1316  BILITOT 0.4 0.5 0.5 0.3  AST 22 20 26 16   ALT 17 16 20 10   ALKPHOS 98 88 78 91  PROT 6.9 7.8 7.2 6.7  ALBUMIN 3.8 4.0 3.9 3.7    TUMOR MARKERS:  Recent Labs  06/02/16 1405 09/17/16 1527 11/07/16 1324  AFPTM 8.6* 9.0* 7.8*    Assessment:  S/p conventional lipiodol transarterial chemo embolization on 2/23 followed by subsequent CT  guided thermal ablation of this lesion on 03/21/2016.  MRI shows no recurrence of disease. Labs look good.  Back pain resolved after lumbar interlaminar epidural injection on the right at L3-4.  Return in 6 months with repeat Labs  and MRI.  Electronically Signed: Murrell Redden PA-C 04/09/2017, 4:05 PM   Please refer to Dr. Katrinka Blazing attestation of this note for management and plan.

## 2017-04-20 ENCOUNTER — Other Ambulatory Visit: Payer: Self-pay | Admitting: Internal Medicine

## 2017-04-20 DIAGNOSIS — Z1231 Encounter for screening mammogram for malignant neoplasm of breast: Secondary | ICD-10-CM

## 2017-04-30 ENCOUNTER — Other Ambulatory Visit: Payer: Self-pay | Admitting: Internal Medicine

## 2017-05-04 NOTE — Telephone Encounter (Signed)
Called refill into [facility] had to leave on pharmacy vm.../lmb 

## 2017-05-17 ENCOUNTER — Other Ambulatory Visit: Payer: Self-pay | Admitting: Internal Medicine

## 2017-05-22 ENCOUNTER — Ambulatory Visit
Admission: RE | Admit: 2017-05-22 | Discharge: 2017-05-22 | Disposition: A | Discharge status: Home / Self Care | Payer: Medicare Other | Source: Ambulatory Visit | Attending: Internal Medicine | Admitting: Internal Medicine

## 2017-05-22 DIAGNOSIS — Z1231 Encounter for screening mammogram for malignant neoplasm of breast: Secondary | ICD-10-CM

## 2017-06-05 ENCOUNTER — Ambulatory Visit (INDEPENDENT_AMBULATORY_CARE_PROVIDER_SITE_OTHER): Payer: Medicare Other | Admitting: Internal Medicine

## 2017-06-05 ENCOUNTER — Encounter: Payer: Self-pay | Admitting: Internal Medicine

## 2017-06-05 VITALS — BP 134/88 | HR 86 | Temp 98.6°F | Ht 63.0 in | Wt 284.0 lb

## 2017-06-05 DIAGNOSIS — C220 Liver cell carcinoma: Secondary | ICD-10-CM

## 2017-06-05 DIAGNOSIS — M5440 Lumbago with sciatica, unspecified side: Secondary | ICD-10-CM | POA: Diagnosis not present

## 2017-06-05 DIAGNOSIS — K769 Liver disease, unspecified: Secondary | ICD-10-CM

## 2017-06-05 DIAGNOSIS — K7689 Other specified diseases of liver: Secondary | ICD-10-CM | POA: Diagnosis not present

## 2017-06-05 DIAGNOSIS — G8929 Other chronic pain: Secondary | ICD-10-CM

## 2017-06-05 DIAGNOSIS — Z0000 Encounter for general adult medical examination without abnormal findings: Secondary | ICD-10-CM | POA: Diagnosis not present

## 2017-06-05 NOTE — Assessment & Plan Note (Signed)
Wt Readings from Last 3 Encounters:  06/05/17 284 lb (128.8 kg)  04/09/17 273 lb (123.8 kg)  01/30/17 285 lb 6.4 oz (129.5 kg)

## 2017-06-05 NOTE — Assessment & Plan Note (Signed)
Chronic ?spinal stenosis Meloxicam, Oxycodone prn - w/caution

## 2017-06-05 NOTE — Progress Notes (Signed)
Subjective:  Patient ID: Samantha [name], female    DOB: [birth year]  Age: 66 y.o. MRN: [id number]  CC: No chief complaint on file.   HPI DAWSYN ZURN presents for OA, LBP, HTN, liver ca, edema f/u  Outpatient Medications Prior to Visit  Medication Sig Dispense Refill  . aspirin EC 81 MG tablet Take 81 mg by mouth daily.    Marland Kitchen BLACK COHOSH PO Take 1 tablet by mouth daily.    . Cholecalciferol (EQL VITAMIN D3) 1000 UNITS tablet Take 1,000 Units by mouth daily.     . furosemide (LASIX) 20 MG tablet Take 1 tablet (20 mg total) by mouth daily. 90 tablet 3  . loratadine (CLARITIN) 10 MG tablet Take 1 tablet (10 mg total) by mouth daily. 100 tablet 3  . losartan (COZAAR) 100 MG tablet Take 1 tablet (100 mg total) by mouth daily. 90 tablet 3  . meloxicam (MOBIC) 7.5 MG tablet TAKE ONE TO TWO TABLETS BY MOUTH ONCE DAILY AS NEEDED FOR PAIN 60 tablet 1  . Multiple Vitamin (MULTIVITAMIN WITH MINERALS) TABS tablet Take 1 tablet by mouth daily.     . naproxen sodium (ALEVE) 220 MG tablet Take 220 mg by mouth every 8 (eight) hours as needed (For pain.).    Marland Kitchen ondansetron (ZOFRAN) 8 MG tablet Take 8 mg by mouth every 8 (eight) hours as needed for nausea or vomiting. Reported on 07/03/2016    . oxyCODONE (OXY IR/ROXICODONE) 5 MG immediate release tablet Take 1 tablet (5 mg total) by mouth every 6 (six) hours as needed (For pain.). 90 tablet 0  . pantoprazole (PROTONIX) 40 MG tablet Take 1 tablet (40 mg total) by mouth daily. 90 tablet 3  . phentermine (ADIPEX-P) 37.5 MG tablet TAKE ONE TABLET BY MOUTH ONCE DAILY BEFORE BREAKFAST 30 tablet 2  . potassium chloride SA (K-DUR,KLOR-CON) 20 MEQ tablet Take 1 tablet (20 mEq total) by mouth daily. 90 tablet 3   No facility-administered medications prior to visit.     ROS Review of Systems  Constitutional: Negative for activity change, appetite change, chills, fatigue and unexpected weight change.  HENT: Negative for congestion, mouth sores and sinus  pressure.   Eyes: Negative for visual disturbance.  Respiratory: Negative for cough and chest tightness.   Gastrointestinal: Negative for abdominal pain and nausea.  Genitourinary: Negative for difficulty urinating, frequency and vaginal pain.  Musculoskeletal: Positive for back pain. Negative for gait problem.  Skin: Negative for pallor and rash.  Neurological: Negative for dizziness, tremors, weakness, numbness and headaches.  Psychiatric/Behavioral: Negative for confusion, sleep disturbance and suicidal ideas.    Objective:  BP 134/88 (BP Location: Left Arm, Patient Position: Standing, Cuff Size: Large)   Pulse 86   Temp 98.6 F (37 C) (Oral)   Ht 5\' 3"  (1.6 m)   Wt 284 lb (128.8 kg)   SpO2 98%   BMI 50.31 kg/m   BP Readings from Last 3 Encounters:  06/05/17 134/88  04/09/17 137/82  01/30/17 130/72    Wt Readings from Last 3 Encounters:  06/05/17 284 lb (128.8 kg)  04/09/17 273 lb (123.8 kg)  01/30/17 285 lb 6.4 oz (129.5 kg)    Physical Exam  Constitutional: She appears well-developed. No distress.  HENT:  Head: Normocephalic.  Right Ear: External ear normal.  Left Ear: External ear normal.  Nose: Nose normal.  Mouth/Throat: Oropharynx is clear and moist.  Eyes: Conjunctivae are normal. Pupils are equal, round, and reactive to light. Right eye  exhibits no discharge. Left eye exhibits no discharge.  Neck: Normal range of motion. Neck supple. No JVD present. No tracheal deviation present. No thyromegaly present.  Cardiovascular: Normal rate, regular rhythm and normal heart sounds.   Pulmonary/Chest: No stridor. No respiratory distress. She has no wheezes.  Abdominal: Soft. Bowel sounds are normal. She exhibits no distension and no mass. There is no tenderness. There is no rebound and no guarding.  Musculoskeletal: She exhibits tenderness. She exhibits no edema.  Lymphadenopathy:    She has no cervical adenopathy.  Neurological: She displays normal reflexes. No  cranial nerve deficit. She exhibits normal muscle tone. Coordination normal.  Skin: No rash noted. No erythema.  Psychiatric: She has a normal mood and affect. Her behavior is normal. Judgment and thought content normal.  obese Trace LE edema  Lab Results  Component Value Date   WBC 4.3 03/27/2017   HGB 12.9 03/27/2017   HCT 41.5 03/27/2017   PLT 195 03/27/2017   GLUCOSE 115 (H) 03/27/2017   CHOL 145 04/13/2015   TRIG 83.0 04/13/2015   HDL 50.00 04/13/2015   LDLCALC 78 04/13/2015   ALT 10 03/27/2017   AST 16 03/27/2017   NA 143 03/27/2017   K 4.3 03/27/2017   CL 109 03/27/2017   CREATININE 0.88 03/27/2017   BUN 15 03/27/2017   CO2 24 03/27/2017   TSH 1.65 04/13/2015   INR 1.0 03/27/2017   HGBA1C 5.8 08/14/2015    Mm Digital Screening Bilateral  Result Date: 05/26/2017 CLINICAL DATA:  Screening. EXAM: DIGITAL SCREENING BILATERAL MAMMOGRAM WITH CAD COMPARISON:  Previous exam(s). ACR Breast Density Category b: There are scattered areas of fibroglandular density. FINDINGS: There are no findings suspicious for malignancy. Images were processed with CAD. IMPRESSION: No mammographic evidence of malignancy. A result letter of this screening mammogram will be mailed directly to the patient. RECOMMENDATION: Screening mammogram in one year. (Code:SM-B-01Y) BI-RADS CATEGORY  1: Negative. Electronically Signed   By: Franki Cabot M.D.   On: 05/26/2017 09:22    Assessment & Plan:   There are no diagnoses linked to this encounter. I am having Ms. Kocurek maintain her Cholecalciferol, multivitamin with minerals, ondansetron, BLACK COHOSH PO, naproxen sodium, aspirin EC, oxyCODONE, potassium chloride SA, pantoprazole, losartan, loratadine, furosemide, phentermine, and meloxicam.  No orders of the defined types were placed in this encounter.    Follow-up: No Follow-up on file.  Walker Kehr, MD

## 2017-06-05 NOTE — Assessment & Plan Note (Signed)
Liver CA S/p ablation

## 2017-06-05 NOTE — Assessment & Plan Note (Signed)
F/u w/Dr McCullough (IR) 

## 2017-06-12 ENCOUNTER — Encounter: Payer: Self-pay | Admitting: Interventional Radiology

## 2017-07-10 DIAGNOSIS — K7469 Other cirrhosis of liver: Secondary | ICD-10-CM | POA: Diagnosis not present

## 2017-07-10 DIAGNOSIS — C220 Liver cell carcinoma: Secondary | ICD-10-CM | POA: Diagnosis not present

## 2017-07-13 ENCOUNTER — Other Ambulatory Visit: Payer: Self-pay | Admitting: Nurse Practitioner

## 2017-07-13 DIAGNOSIS — R911 Solitary pulmonary nodule: Secondary | ICD-10-CM

## 2017-07-17 ENCOUNTER — Ambulatory Visit
Admission: RE | Admit: 2017-07-17 | Discharge: 2017-07-17 | Disposition: A | Discharge status: Home / Self Care | Payer: Medicare Other | Source: Ambulatory Visit | Attending: Nurse Practitioner | Admitting: Nurse Practitioner

## 2017-07-17 DIAGNOSIS — R911 Solitary pulmonary nodule: Secondary | ICD-10-CM

## 2017-08-29 ENCOUNTER — Emergency Department (HOSPITAL_COMMUNITY)
Admission: EM | Admit: 2017-08-29 | Discharge: 2017-08-29 | Disposition: A | Discharge status: Home / Self Care | Payer: Medicare Other | Attending: Emergency Medicine | Admitting: Emergency Medicine

## 2017-08-29 ENCOUNTER — Encounter (HOSPITAL_COMMUNITY): Payer: Self-pay | Admitting: *Deleted

## 2017-08-29 DIAGNOSIS — Y9389 Activity, other specified: Secondary | ICD-10-CM | POA: Diagnosis not present

## 2017-08-29 DIAGNOSIS — Y999 Unspecified external cause status: Secondary | ICD-10-CM | POA: Insufficient documentation

## 2017-08-29 DIAGNOSIS — Z8505 Personal history of malignant neoplasm of liver: Secondary | ICD-10-CM | POA: Insufficient documentation

## 2017-08-29 DIAGNOSIS — Z96612 Presence of left artificial shoulder joint: Secondary | ICD-10-CM | POA: Insufficient documentation

## 2017-08-29 DIAGNOSIS — Z7982 Long term (current) use of aspirin: Secondary | ICD-10-CM | POA: Insufficient documentation

## 2017-08-29 DIAGNOSIS — I10 Essential (primary) hypertension: Secondary | ICD-10-CM | POA: Diagnosis not present

## 2017-08-29 DIAGNOSIS — S39012A Strain of muscle, fascia and tendon of lower back, initial encounter: Secondary | ICD-10-CM | POA: Diagnosis not present

## 2017-08-29 DIAGNOSIS — Z79899 Other long term (current) drug therapy: Secondary | ICD-10-CM | POA: Diagnosis not present

## 2017-08-29 DIAGNOSIS — Y92410 Unspecified street and highway as the place of occurrence of the external cause: Secondary | ICD-10-CM | POA: Diagnosis not present

## 2017-08-29 DIAGNOSIS — Z87891 Personal history of nicotine dependence: Secondary | ICD-10-CM | POA: Insufficient documentation

## 2017-08-29 DIAGNOSIS — Z96653 Presence of artificial knee joint, bilateral: Secondary | ICD-10-CM | POA: Diagnosis not present

## 2017-08-29 DIAGNOSIS — S3992XA Unspecified injury of lower back, initial encounter: Secondary | ICD-10-CM | POA: Diagnosis present

## 2017-08-29 MED ORDER — CYCLOBENZAPRINE HCL 5 MG PO TABS: 5.0000 mg | ORAL_TABLET | Freq: Three times a day (TID) | ORAL | 0 refills | Status: DC | PRN
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
## 2017-08-29 NOTE — Discharge Instructions (Signed)
Do not drive while taking the muscle relaxant as it will make you sleepy.

## 2017-09-01 ENCOUNTER — Other Ambulatory Visit: Payer: Self-pay

## 2017-09-01 NOTE — Patient Outreach (Signed)
Outreach patient after ED on 08/29/17.  Unable to make contact but left a voicemail message for a return call.  Also mailed unsuccessful letter on today.

## 2017-09-08 ENCOUNTER — Ambulatory Visit (INDEPENDENT_AMBULATORY_CARE_PROVIDER_SITE_OTHER): Payer: Medicare Other | Admitting: General Practice

## 2017-09-08 DIAGNOSIS — Z23 Encounter for immunization: Secondary | ICD-10-CM | POA: Diagnosis not present

## 2017-10-14 ENCOUNTER — Other Ambulatory Visit: Payer: Self-pay | Admitting: Radiology

## 2017-10-14 ENCOUNTER — Other Ambulatory Visit (HOSPITAL_COMMUNITY): Payer: Self-pay | Admitting: Interventional Radiology

## 2017-10-14 ENCOUNTER — Other Ambulatory Visit: Payer: Self-pay | Admitting: Hematology

## 2017-10-14 DIAGNOSIS — C220 Liver cell carcinoma: Secondary | ICD-10-CM

## 2017-10-16 ENCOUNTER — Telehealth: Payer: Self-pay | Admitting: Medical Oncology

## 2017-10-16 ENCOUNTER — Other Ambulatory Visit: Payer: Self-pay | Admitting: Medical Oncology

## 2017-10-16 ENCOUNTER — Other Ambulatory Visit: Payer: Self-pay | Admitting: Radiology

## 2017-10-16 ENCOUNTER — Other Ambulatory Visit (HOSPITAL_COMMUNITY): Payer: Self-pay | Admitting: Interventional Radiology

## 2017-10-16 DIAGNOSIS — C220 Liver cell carcinoma: Secondary | ICD-10-CM

## 2017-10-16 NOTE — Telephone Encounter (Signed)
Labs done at [facility] per pt and not viewed in [facility].  I called pt and she said Dr Geroge Baseman ordered labs and scans and to call [facility] for fax number and call [facility] to fax labs and sign order.

## 2017-10-17 ENCOUNTER — Ambulatory Visit (HOSPITAL_COMMUNITY): Admission: RE | Admit: 2017-10-17 | Payer: Medicare Other | Source: Ambulatory Visit

## 2017-10-17 ENCOUNTER — Ambulatory Visit (HOSPITAL_COMMUNITY): Payer: Medicare Other

## 2017-10-19 LAB — CBC
HEMATOCRIT: 37.3 % (ref 35.0–45.0)
Hemoglobin: 12.3 g/dL (ref 11.7–15.5)
MCH: 26.7 pg — ABNORMAL LOW (ref 27.0–33.0)
MCHC: 33 g/dL (ref 32.0–36.0)
MCV: 81.1 fL (ref 80.0–100.0)
MPV: 10.1 fL (ref 7.5–12.5)
Platelets: 200 10*3/uL (ref 140–400)
RBC: 4.6 10*6/uL (ref 3.80–5.10)
RDW: 13.1 % (ref 11.0–15.0)
WBC: 4.3 10*3/uL (ref 3.8–10.8)

## 2017-10-19 LAB — COMPLETE METABOLIC PANEL WITH GFR
AG Ratio: 1.4 (calc) (ref 1.0–2.5)
ALBUMIN MSPROF: 3.9 g/dL (ref 3.6–5.1)
ALT: 9 U/L (ref 6–29)
AST: 14 U/L (ref 10–35)
Alkaline phosphatase (APISO): 106 U/L (ref 33–130)
BUN: 14 mg/dL (ref 7–25)
CALCIUM: 9 mg/dL (ref 8.6–10.4)
CO2: 25 mmol/L (ref 20–32)
CREATININE: 0.97 mg/dL (ref 0.50–0.99)
Chloride: 108 mmol/L (ref 98–110)
GFR, EST NON AFRICAN AMERICAN: 60 mL/min/{1.73_m2} (ref >=60)
GFR, Est African American: 70 mL/min/{1.73_m2} (ref >=60)
GLUCOSE: 111 mg/dL (ref 65–139)
Globulin: 2.7 g/dL (calc) (ref 1.9–3.7)
Potassium: 4.2 mmol/L (ref 3.5–5.3)
Sodium: 141 mmol/L (ref 135–146)
TOTAL PROTEIN: 6.6 g/dL (ref 6.1–8.1)
Total Bilirubin: 0.3 mg/dL (ref 0.2–1.2)

## 2017-10-19 LAB — AFP TUMOR MARKER: AFP TUMOR MARKER: 7 ng/mL — AB

## 2017-10-19 LAB — PROTIME-INR
INR: 1
Prothrombin Time: 10.4 s (ref 9.0–11.5)

## 2017-10-20 ENCOUNTER — Telehealth: Payer: Self-pay

## 2017-10-20 ENCOUNTER — Inpatient Hospital Stay: Admission: RE | Admit: 2017-10-20 | Payer: Medicare Other | Source: Ambulatory Visit

## 2017-10-20 ENCOUNTER — Other Ambulatory Visit (INDEPENDENT_AMBULATORY_CARE_PROVIDER_SITE_OTHER): Payer: Medicare Other

## 2017-10-20 DIAGNOSIS — Z0000 Encounter for general adult medical examination without abnormal findings: Secondary | ICD-10-CM | POA: Diagnosis not present

## 2017-10-20 DIAGNOSIS — K769 Liver disease, unspecified: Secondary | ICD-10-CM | POA: Diagnosis not present

## 2017-10-20 DIAGNOSIS — K7460 Unspecified cirrhosis of liver: Secondary | ICD-10-CM

## 2017-10-20 LAB — CBC WITH DIFFERENTIAL/PLATELET
Basophils Absolute: 0 10*3/uL (ref 0.0–0.1)
Basophils Relative: 1 % (ref 0.0–3.0)
EOS PCT: 3 % (ref 0.0–5.0)
Eosinophils Absolute: 0.1 10*3/uL (ref 0.0–0.7)
HCT: 40.4 % (ref 36.0–46.0)
Hemoglobin: 12.7 g/dL (ref 12.0–15.0)
LYMPHS ABS: 1.4 10*3/uL (ref 0.7–4.0)
Lymphocytes Relative: 30.4 % (ref 12.0–46.0)
MCHC: 31.3 g/dL (ref 30.0–36.0)
MCV: 84.4 fl (ref 78.0–100.0)
MONO ABS: 0.6 10*3/uL (ref 0.1–1.0)
Monocytes Relative: 12.8 % — ABNORMAL HIGH (ref 3.0–12.0)
NEUTROS ABS: 2.5 10*3/uL (ref 1.4–7.7)
NEUTROS PCT: 52.8 % (ref 43.0–77.0)
PLATELETS: 184 10*3/uL (ref 150.0–400.0)
RBC: 4.79 Mil/uL (ref 3.87–5.11)
RDW: 14.3 % (ref 11.5–15.5)
WBC: 4.7 10*3/uL (ref 4.0–10.5)

## 2017-10-20 LAB — HEPATIC FUNCTION PANEL
ALK PHOS: 91 U/L (ref 39–117)
ALT: 10 U/L (ref 0–35)
AST: 16 U/L (ref 0–37)
Albumin: 4 g/dL (ref 3.5–5.2)
BILIRUBIN DIRECT: 0.1 mg/dL (ref 0.0–0.3)
BILIRUBIN TOTAL: 0.4 mg/dL (ref 0.2–1.2)
Total Protein: 7.1 g/dL (ref 6.0–8.3)

## 2017-10-20 LAB — URINALYSIS, ROUTINE W REFLEX MICROSCOPIC
Bilirubin Urine: NEGATIVE
Hgb urine dipstick: NEGATIVE
KETONES UR: NEGATIVE
Nitrite: NEGATIVE
PH: 6 (ref 5.0–8.0)
Specific Gravity, Urine: 1.02 (ref 1.000–1.030)
Total Protein, Urine: NEGATIVE
Urine Glucose: NEGATIVE
Urobilinogen, UA: 0.2 (ref 0.0–1.0)

## 2017-10-20 LAB — BASIC METABOLIC PANEL
BUN: 15 mg/dL (ref 6–23)
CALCIUM: 9.4 mg/dL (ref 8.4–10.5)
CO2: 26 meq/L (ref 19–32)
CREATININE: 0.81 mg/dL (ref 0.40–1.20)
Chloride: 106 mEq/L (ref 96–112)
GFR: 90.42 mL/min (ref >60.00)
Glucose, Bld: 95 mg/dL (ref 70–99)
Potassium: 4.3 mEq/L (ref 3.5–5.1)
SODIUM: 140 meq/L (ref 135–145)

## 2017-10-20 LAB — TSH: TSH: 1.91 u[IU]/mL (ref 0.35–4.50)

## 2017-10-20 LAB — LIPID PANEL
CHOL/HDL RATIO: 3
Cholesterol: 154 mg/dL (ref 0–200)
HDL: 44.8 mg/dL (ref >39.00)
LDL CALC: 82 mg/dL (ref 0–99)
NONHDL: 108.88
TRIGLYCERIDES: 132 mg/dL (ref 0.0–149.0)
VLDL: 26.4 mg/dL (ref 0.0–40.0)

## 2017-10-20 LAB — PROTIME-INR
INR: 14.7 ratio (ref 0.8–1.0)
Prothrombin Time: 154.5 s (ref 9.6–13.1)

## 2017-10-20 NOTE — Telephone Encounter (Signed)
Do not take Naproxen, black cohosh Check INR, PTT, AFP tomorrow If bleeding - go to ER Thx

## 2017-10-20 NOTE — Telephone Encounter (Signed)
CRITICAL VALUE STICKER  CRITICAL VALUE: INR: 14.7   RECEIVER (on-site recipient of call): [place] NOTIFIED: 3:45  MESSENGER (representative from lab):  MD NOTIFIED: Plotnikov  TIME OF NOTIFICATION: 3:50  RESPONSE:

## 2017-10-21 ENCOUNTER — Telehealth: Payer: Self-pay

## 2017-10-21 NOTE — Telephone Encounter (Signed)
Pt has been informed of the instruction per Dr. Alain Marion and has expressed understanding. She will come to have additional labs drawn.

## 2017-10-21 NOTE — Telephone Encounter (Signed)
Pt has been informed.

## 2017-10-21 NOTE — Telephone Encounter (Signed)
-----   Message from Cassandria Anger, MD sent at 10/20/2017 11:41 PM EDT ----- Samantha [name], Please inform the patient as per telephone note Thanks, AP

## 2017-10-22 ENCOUNTER — Other Ambulatory Visit: Payer: Medicare Other

## 2017-10-22 DIAGNOSIS — K7460 Unspecified cirrhosis of liver: Secondary | ICD-10-CM | POA: Diagnosis not present

## 2017-10-22 LAB — PROTIME-INR
INR: 1
Prothrombin Time: 10.3 s (ref 9.0–11.5)

## 2017-10-22 LAB — APTT: aPTT: 25 s (ref 22–34)

## 2017-10-23 LAB — AFP TUMOR MARKER: AFP TUMOR MARKER: 7.3 ng/mL — AB

## 2017-10-28 ENCOUNTER — Other Ambulatory Visit: Payer: Self-pay | Admitting: Internal Medicine

## 2017-10-30 ENCOUNTER — Encounter: Payer: Self-pay | Admitting: Internal Medicine

## 2017-10-30 ENCOUNTER — Ambulatory Visit (INDEPENDENT_AMBULATORY_CARE_PROVIDER_SITE_OTHER): Payer: Medicare Other | Admitting: Internal Medicine

## 2017-10-30 VITALS — BP 128/82 | HR 94 | Temp 98.8°F | Ht 63.0 in | Wt 294.0 lb

## 2017-10-30 DIAGNOSIS — R945 Abnormal results of liver function studies: Secondary | ICD-10-CM | POA: Diagnosis not present

## 2017-10-30 DIAGNOSIS — I10 Essential (primary) hypertension: Secondary | ICD-10-CM

## 2017-10-30 DIAGNOSIS — C220 Liver cell carcinoma: Secondary | ICD-10-CM

## 2017-10-30 DIAGNOSIS — R7989 Other specified abnormal findings of blood chemistry: Secondary | ICD-10-CM

## 2017-10-30 DIAGNOSIS — B182 Chronic viral hepatitis C: Secondary | ICD-10-CM

## 2017-10-30 MED ORDER — FUROSEMIDE 20 MG PO TABS: 20.0000 mg | ORAL_TABLET | Freq: Every day | ORAL | 3 refills | Status: DC

## 2017-10-30 MED ORDER — TRAMADOL HCL 50 MG PO TABS: 50.0000 mg | ORAL_TABLET | Freq: Three times a day (TID) | ORAL | 2 refills | Status: DC | PRN

## 2017-10-30 NOTE — Assessment & Plan Note (Signed)
Losartan, Norvasc, Lasix 

## 2017-10-30 NOTE — Assessment & Plan Note (Signed)
F/u LFTs

## 2017-10-30 NOTE — Assessment & Plan Note (Addendum)
Wt Readings from Last 3 Encounters:  10/30/17 294 lb (133.4 kg)  06/05/17 284 lb (128.8 kg)  04/09/17 273 lb (123.8 kg)   BMI 52 ref to Dr Leafy Ro

## 2017-10-30 NOTE — Progress Notes (Signed)
Subjective:  Patient ID: Samantha [name], female    DOB: 23-Oct-1951  Age: 66 y.o. MRN: [id number]  CC: No chief complaint on file.   HPI Samantha [name] presents for edema, wt gain, HTN f/u. C/o OA pain. C/o edema.  Outpatient Medications Prior to Visit  Medication Sig Dispense Refill  . aspirin EC 81 MG tablet Take 81 mg by mouth daily.    Marland Kitchen BLACK COHOSH PO Take 1 tablet by mouth daily.    . Cholecalciferol (EQL VITAMIN D3) 1000 UNITS tablet Take 1,000 Units by mouth daily.     . cyclobenzaprine (FLEXERIL) 5 MG tablet Take 1 tablet (5 mg total) by mouth 3 (three) times daily as needed for muscle spasms. 30 tablet 0  . furosemide (LASIX) 20 MG tablet Take 1 tablet (20 mg total) by mouth daily. 90 tablet 3  . loratadine (CLARITIN) 10 MG tablet Take 1 tablet (10 mg total) by mouth daily. 100 tablet 3  . losartan (COZAAR) 100 MG tablet Take 1 tablet (100 mg total) by mouth daily. 90 tablet 3  . meloxicam (MOBIC) 7.5 MG tablet TAKE ONE TO TWO TABLETS BY MOUTH ONCE DAILY AS NEEDED FOR PAIN 60 tablet 1  . Multiple Vitamin (MULTIVITAMIN WITH MINERALS) TABS tablet Take 1 tablet by mouth daily.     . naproxen sodium (ALEVE) 220 MG tablet Take 220 mg by mouth every 8 (eight) hours as needed (For pain.).    Marland Kitchen ondansetron (ZOFRAN) 8 MG tablet Take 8 mg by mouth every 8 (eight) hours as needed for nausea or vomiting. Reported on 07/03/2016    . oxyCODONE (OXY IR/ROXICODONE) 5 MG immediate release tablet Take 1 tablet (5 mg total) by mouth every 6 (six) hours as needed (For pain.). 90 tablet 0  . pantoprazole (PROTONIX) 40 MG tablet Take 1 tablet (40 mg total) by mouth daily. 90 tablet 3  . phentermine (ADIPEX-P) 37.5 MG tablet TAKE ONE TABLET BY MOUTH ONCE DAILY BEFORE BREAKFAST 30 tablet 2  . potassium chloride SA (K-DUR,KLOR-CON) 20 MEQ tablet Take 1 tablet (20 mEq total) by mouth daily. 90 tablet 3   No facility-administered medications prior to visit.     ROS Review of Systems    Constitutional: Positive for unexpected weight change. Negative for activity change, appetite change, chills and fatigue.  HENT: Negative for congestion, mouth sores and sinus pressure.   Eyes: Negative for visual disturbance.  Respiratory: Negative for cough and chest tightness.   Cardiovascular: Positive for leg swelling.  Gastrointestinal: Negative for abdominal pain and nausea.  Genitourinary: Negative for difficulty urinating, frequency and vaginal pain.  Musculoskeletal: Positive for back pain. Negative for gait problem.  Skin: Negative for pallor and rash.  Neurological: Negative for dizziness, tremors, weakness, numbness and headaches.  Psychiatric/Behavioral: Negative for confusion and sleep disturbance.    Objective:  BP 128/82 (BP Location: Left Arm, Patient Position: Sitting, Cuff Size: Large)   Pulse 94   Temp 98.8 F (37.1 C) (Oral)   Ht 5\' 3"  (1.6 m)   Wt 294 lb (133.4 kg)   SpO2 98%   BMI 52.08 kg/m   BP Readings from Last 3 Encounters:  10/30/17 128/82  08/29/17 (!) 136/94  06/05/17 134/88    Wt Readings from Last 3 Encounters:  10/30/17 294 lb (133.4 kg)  06/05/17 284 lb (128.8 kg)  04/09/17 273 lb (123.8 kg)    Physical Exam  Constitutional: She appears well-developed. No distress.  HENT:  Head: Normocephalic.  Right  Ear: External ear normal.  Left Ear: External ear normal.  Nose: Nose normal.  Mouth/Throat: Oropharynx is clear and moist.  Eyes: Pupils are equal, round, and reactive to light. Conjunctivae are normal. Right eye exhibits no discharge. Left eye exhibits no discharge.  Neck: Normal range of motion. Neck supple. No JVD present. No tracheal deviation present. No thyromegaly present.  Cardiovascular: Normal rate, regular rhythm and normal heart sounds.   Pulmonary/Chest: No stridor. No respiratory distress. She has no wheezes.  Abdominal: Soft. Bowel sounds are normal. She exhibits no distension and no mass. There is no tenderness. There  is no rebound and no guarding.  Musculoskeletal: She exhibits edema. She exhibits no tenderness.  Lymphadenopathy:    She has no cervical adenopathy.  Neurological: She displays normal reflexes. No cranial nerve deficit. She exhibits normal muscle tone. Coordination normal.  Skin: No rash noted. No erythema.  Psychiatric: She has a normal mood and affect. Her behavior is normal. Judgment and thought content normal.  trace B ankle edema Obese  Lab Results  Component Value Date   WBC 4.7 10/20/2017   HGB 12.7 10/20/2017   HCT 40.4 10/20/2017   PLT 184.0 10/20/2017   GLUCOSE 95 10/20/2017   CHOL 154 10/20/2017   TRIG 132.0 10/20/2017   HDL 44.80 10/20/2017   LDLCALC 82 10/20/2017   ALT 10 10/20/2017   AST 16 10/20/2017   NA 140 10/20/2017   K 4.3 10/20/2017   CL 106 10/20/2017   CREATININE 0.81 10/20/2017   BUN 15 10/20/2017   CO2 26 10/20/2017   TSH 1.91 10/20/2017   INR 1.0 10/22/2017   HGBA1C 5.8 08/14/2015    No results found.  Assessment & Plan:   There are no diagnoses linked to this encounter. I am having [name] maintain her Cholecalciferol, multivitamin with minerals, ondansetron, BLACK COHOSH PO, naproxen sodium, aspirin EC, oxyCODONE, potassium chloride SA, pantoprazole, losartan, loratadine, furosemide, phentermine, meloxicam, and cyclobenzaprine.  No orders of the defined types were placed in this encounter.    Follow-up: No Follow-up on file.  Walker Kehr, MD

## 2017-10-30 NOTE — Assessment & Plan Note (Signed)
F/u MRI is pending

## 2017-10-30 NOTE — Assessment & Plan Note (Signed)
Treated

## 2017-11-03 ENCOUNTER — Ambulatory Visit: Payer: Medicare Other

## 2017-11-03 ENCOUNTER — Ambulatory Visit (HOSPITAL_COMMUNITY)
Admission: RE | Admit: 2017-11-03 | Discharge: 2017-11-03 | Disposition: A | Discharge status: Home / Self Care | Payer: Medicare Other | Source: Ambulatory Visit | Attending: Interventional Radiology | Admitting: Interventional Radiology

## 2017-11-03 DIAGNOSIS — C220 Liver cell carcinoma: Secondary | ICD-10-CM | POA: Insufficient documentation

## 2017-11-03 DIAGNOSIS — Z9889 Other specified postprocedural states: Secondary | ICD-10-CM | POA: Insufficient documentation

## 2017-11-03 DIAGNOSIS — K7460 Unspecified cirrhosis of liver: Secondary | ICD-10-CM | POA: Diagnosis not present

## 2017-11-03 MED ORDER — GADOBENATE DIMEGLUMINE 529 MG/ML IV SOLN
20.0000 mL | Freq: Once | INTRAVENOUS | Status: AC
  Administered 2017-11-03: 20 mL via INTRAVENOUS

## 2017-11-05 ENCOUNTER — Ambulatory Visit
Admission: RE | Admit: 2017-11-05 | Discharge: 2017-11-05 | Disposition: A | Discharge status: Home / Self Care | Payer: Medicare Other | Source: Ambulatory Visit | Attending: Interventional Radiology | Admitting: Interventional Radiology

## 2017-11-05 DIAGNOSIS — K7581 Nonalcoholic steatohepatitis (NASH): Secondary | ICD-10-CM | POA: Diagnosis not present

## 2017-11-05 DIAGNOSIS — C220 Liver cell carcinoma: Secondary | ICD-10-CM

## 2017-11-05 HISTORY — PX: IR RADIOLOGIST EVAL & MGMT: IMG5224

## 2017-11-10 NOTE — Progress Notes (Addendum)
Subjective:   Samantha [name] is a 66 y.o. female who presents for Medicare Annual (Subsequent) preventive examination.  Review of Systems:  No ROS.  Medicare Wellness Visit. Additional risk factors are reflected in the social history.  Cardiac Risk Factors include: advanced age (>63men, >73 women);dyslipidemia;hypertension;obesity (BMI >30kg/m2) Sleep patterns: feels rested on waking, does not get up to void and sleeps 7-8 hours nightly.    Home Safety/Smoke Alarms: Feels safe in home. Smoke alarms in place.  Living environment; residence and Firearm Safety: 1-story house/ trailer, no firearms. Lives with roomate, no needs for DME, good support system Seat Belt Safety/Bike Helmet: Wears seat belt.      Objective:     Vitals: BP 132/68   Pulse 87   Resp 20   Ht 5\' 3"  (1.6 m)   Wt 299 lb (135.6 kg)   SpO2 98%   BMI 52.97 kg/m   Body mass index is 52.97 kg/m.   Tobacco Social History   Tobacco Use  Smoking Status Former Smoker  . Packs/day: 0.25  . Years: 20.00  . Pack years: 5.00  . Last attempt to quit: 12/30/2007  . Years since quitting: 9.8  Smokeless Tobacco Never Used     Counseling given: Not Answered   Past Medical History:  Diagnosis Date  . Allergy    seasonal  . Cancer ([place]) 2017   liver cancer  . Elevated glucose    no longer an issue 03-18-16  . GERD (gastroesophageal reflux disease)   . H/O seasonal allergies   . Hepatitis C    hepatitis c completed march 2017  . Hyperlipidemia 2011  . Hypertension   . LBP (low back pain)   . Obesity   . Osteoarthritis    B knees   Past Surgical History:  Procedure Laterality Date  . CHOLECYSTECTOMY    . IR GENERIC HISTORICAL  07/03/2016   IR RADIOLOGIST EVAL & MGMT 07/03/2016 [name], MD [facility] INTERV RAD  . IR GENERIC HISTORICAL  11/18/2016   IR RADIOLOGIST EVAL & MGMT 11/18/2016 [name], MD [facility] INTERV RAD  . IR RADIOLOGIST EVAL & MGMT  04/09/2017  . JOINT REPLACEMENT    . left  shoulder arthroplasty    . right knee replacement  2007  . total knee replacement L  03-13-08  . TUBAL LIGATION     Family History  Problem Relation Age of Onset  . Hypertension Mother   . Ovarian cancer Mother   . Hypertension Father   . Cancer Father 70       prostate cancer   . Diabetes Other   . Hypertension Other   . Colon cancer Neg Hx   . Esophageal cancer Neg Hx   . Rectal cancer Neg Hx   . Stomach cancer Neg Hx    Social History   Substance and Sexual Activity  Sexual Activity Not Currently    Outpatient Encounter Medications as of 11/11/2017  Medication Sig  . aspirin EC 81 MG tablet Take 81 mg by mouth daily.  Marland Kitchen BLACK COHOSH PO Take 1 tablet by mouth daily.  . Cholecalciferol (EQL VITAMIN D3) 1000 UNITS tablet Take 1,000 Units by mouth daily.   . cyclobenzaprine (FLEXERIL) 5 MG tablet Take 1 tablet (5 mg total) by mouth 3 (three) times daily as needed for muscle spasms.  . furosemide (LASIX) 20 MG tablet Take 1-2 tablets (20-40 mg total) by mouth daily.  Marland Kitchen loratadine (CLARITIN) 10 MG tablet Take 1 tablet (  10 mg total) by mouth daily.  Marland Kitchen losartan (COZAAR) 100 MG tablet Take 1 tablet (100 mg total) by mouth daily.  . meloxicam (MOBIC) 7.5 MG tablet TAKE ONE TO TWO TABLETS BY MOUTH ONCE DAILY AS NEEDED FOR PAIN  . Multiple Vitamin (MULTIVITAMIN WITH MINERALS) TABS tablet Take 1 tablet by mouth daily.   . ondansetron (ZOFRAN) 8 MG tablet Take 8 mg by mouth every 8 (eight) hours as needed for nausea or vomiting. Reported on 07/03/2016  . pantoprazole (PROTONIX) 40 MG tablet Take 1 tablet (40 mg total) by mouth daily.  . phentermine (ADIPEX-P) 37.5 MG tablet TAKE ONE TABLET BY MOUTH ONCE DAILY BEFORE BREAKFAST  . potassium chloride SA (K-DUR,KLOR-CON) 20 MEQ tablet Take 1 tablet (20 mEq total) by mouth daily.  . traMADol (ULTRAM) 50 MG tablet Take 1-2 tablets (50-100 mg total) by mouth every 8 (eight) hours as needed.   No facility-administered encounter medications on  file as of 11/11/2017.     Activities of Daily Living In your present state of health, do you have any difficulty performing the following activities: 11/11/2017  Hearing? N  Vision? N  Difficulty concentrating or making decisions? N  Walking or climbing stairs? N  Dressing or bathing? N  Doing errands, shopping? N  Preparing Food and eating ? N  Using the Toilet? N  In the past six months, have you accidently leaked urine? N  Do you have problems with loss of bowel control? N  Managing your Medications? N  Managing your Finances? N  Housekeeping or managing your Housekeeping? N  Some recent data might be hidden    Patient Care Team: Plotnikov, Evie Lacks, MD as PCP - General Frederik Pear, MD as Adriana Mccallum (Orthopedic Surgery) Everett Graff, MD (Obstetrics and Gynecology) Linus Salmons Okey Regal, MD as Consulting Physician (Infectious Diseases) Stark Klein, MD as Consulting Physician (General Surgery) Pyrtle, Lajuan Lines, MD as Consulting Physician (Gastroenterology)    Assessment:    Physical assessment deferred to PCP.  Exercise Activities and Dietary recommendations Current Exercise Habits: The patient has a physically strenous job, but has no regular exercise apart from work.(takes care of grandchildren daily), Exercise limited by: orthopedic condition(s)(obesity)  Diet (meal preparation, eat out, water intake, caffeinated beverages, dairy products, fruits and vegetables): in general, a "healthy" diet  , well balanced   Reviewed heart healthy diet, discussed weight loss tips, encouraged patient to increase daily water intake. Diet education was provided via handout.  Goals    None     Fall Risk Fall Risk  11/11/2017 10/30/2017 07/10/2016 03/03/2016 01/10/2016  Falls in the past year? No No No No No   Depression Screen PHQ 2/9 Scores 11/11/2017 10/30/2017 07/10/2016 03/03/2016  PHQ - 2 Score 0 0 0 0  PHQ- 9 Score 0 - - -     Cognitive Function MMSE - Mini Mental State Exam  11/11/2017  Orientation to time 5  Orientation to Place 5  Registration 3  Attention/ Calculation 5  Recall 2  Language- name 2 objects 2  Language- repeat 1  Language- follow 3 step command 3  Language- read & follow direction 1  Write a sentence 1  Copy design 1  Total score 29        Ms. Hopwood , Thank you for taking time to come for your Medicare Wellness Visit. I appreciate your ongoing commitment to your health goals. Please review the following plan we discussed and let me know if I can assist you in  the future.   These are the goals we discussed: Goals    None      This is a list of the screening recommended for you and due dates:  Health Maintenance  Topic Date Due  . DEXA scan (bone density measurement)  10/04/2014  . Pneumonia vaccines (2 of 2 - PPSV23) 03/23/2017  . Colon Cancer Screening  01/09/2018  . Mammogram  05/23/2019  . Tetanus Vaccine  11/30/2022  . Flu Shot  Completed  .  Hepatitis C: One time screening is recommended by Center for Disease Control  (CDC) for  adults born from [birth year] through 1967.   Completed    Immunization History  Administered Date(s) Administered  . Influenza Split 11/25/2011  . Influenza Whole 09/13/2008, 08/31/2010  . Influenza, High Dose Seasonal PF 09/17/2016, 09/08/2017  . Influenza, Seasonal, Injecte, Preservative Fre 11/30/2012  . Influenza,inj,Quad PF,6+ Mos 09/06/2013, 09/05/2014, 09/07/2015  . Pneumococcal Conjugate-13 12/08/2014  . Pneumococcal Polysaccharide-23 03/23/2012, 11/11/2017  . Tdap 11/30/2012   Screening Tests Health Maintenance  Topic Date Due  . DEXA SCAN  10/04/2014  . PNA vac Low Risk Adult (2 of 2 - PPSV23) 03/23/2017  . COLONOSCOPY  01/09/2018  . MAMMOGRAM  05/23/2019  . TETANUS/TDAP  11/30/2022  . INFLUENZA VACCINE  Completed  . Hepatitis C Screening  Completed      Plan:     Continue doing brain stimulating activities (puzzles, reading, adult coloring books, staying active) to keep  memory sharp.   Continue to eat heart healthy diet (full of fruits, vegetables, whole grains, lean protein, water--limit salt, fat, and sugar intake) and increase physical activity as tolerated.  I have personally reviewed and noted the following in the patient's chart:   . Medical and social history . Use of alcohol, tobacco or illicit drugs  . Current medications and supplements . Functional ability and status . Nutritional status . Physical activity . Advanced directives . List of other physicians . Vitals . Screenings to include cognitive, depression, and falls . Referrals and appointments  In addition, I have reviewed and discussed with patient certain preventive protocols, quality metrics, and best practice recommendations. A written personalized care plan for preventive services as well as general preventive health recommendations were provided to patient.     Michiel Cowboy, RN  11/11/2017  Medical screening examination/treatment/procedure(s) were performed by non-physician practitioner and as supervising physician I was immediately available for consultation/collaboration. I agree with above. Lew Dawes, MD

## 2017-11-11 ENCOUNTER — Ambulatory Visit (INDEPENDENT_AMBULATORY_CARE_PROVIDER_SITE_OTHER): Payer: Medicare Other | Admitting: *Deleted

## 2017-11-11 VITALS — BP 132/68 | HR 87 | Resp 20 | Ht 63.0 in | Wt 299.0 lb

## 2017-11-11 DIAGNOSIS — Z23 Encounter for immunization: Secondary | ICD-10-CM

## 2017-11-11 DIAGNOSIS — Z0000 Encounter for general adult medical examination without abnormal findings: Secondary | ICD-10-CM | POA: Diagnosis not present

## 2017-11-11 NOTE — Patient Instructions (Addendum)
Continue doing brain stimulating activities (puzzles, reading, adult coloring books, staying active) to keep memory sharp.   Continue to eat heart healthy diet (full of fruits, vegetables, whole grains, lean protein, water--limit salt, fat, and sugar intake) and increase physical activity as tolerated.   [name] , Thank you for taking time to come for your Medicare Wellness Visit. I appreciate your ongoing commitment to your health goals. Please review the following plan we discussed and let me know if I can assist you in the future.   These are the goals we discussed: I would like to travel as much as possible, enjoy family and love life.  Goals    None      This is a list of the screening recommended for you and due dates:  Health Maintenance  Topic Date Due  . DEXA scan (bone density measurement)  10/04/2014  . Pneumonia vaccines (2 of 2 - PPSV23) 03/23/2017  . Colon Cancer Screening  01/09/2018  . Mammogram  05/23/2019  . Tetanus Vaccine  11/30/2022  . Flu Shot  Completed  .  Hepatitis C: One time screening is recommended by Center for Disease Control  (CDC) for  adults born from [birth year] through 1967.   Completed

## 2017-11-18 ENCOUNTER — Encounter: Payer: Self-pay | Admitting: Interventional Radiology

## 2017-11-25 NOTE — Progress Notes (Signed)
Chief Complaint: Patient was seen in follow up today for  Chief Complaint  Patient presents with  . Follow-up    2.5 yr follow up Thermal Ablation of Liver   at the request of [place]  Referring Physician(s): Truitt Merle, MD  History of Present Illness: Samantha [name] is a 66 y.o. 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  She underwent conventional lipiodol transarterial chemo embolization on 2/23 and then subsequent CT guided thermal ablation of this lesion on 03/21/2016.  She presents today for 1.5 year follow up. She had MRI today and and labs drawn 03/27/2017.  She is doing very well today and has no complaints.    Past Medical History:  Diagnosis Date  . Allergy    seasonal  . Cancer ([place]) 2017   liver cancer  . Elevated glucose    no longer an issue 03-18-16  . GERD (gastroesophageal reflux disease)   . H/O seasonal allergies   . Hepatitis C    hepatitis c completed march 2017  . Hyperlipidemia 2011  . Hypertension   . LBP (low back pain)   . Obesity   . Osteoarthritis    B knees    Past Surgical History:  Procedure Laterality Date  . CHOLECYSTECTOMY    . COLONOSCOPY N/A 01/09/2015   Procedure: COLONOSCOPY;  Surgeon: Jerene Bears, MD;  Location: [facility];  Service: Gastroenterology;  Laterality: N/A;  . ESOPHAGOGASTRODUODENOSCOPY (EGD) WITH PROPOFOL N/A 06/10/2016   Procedure: ESOPHAGOGASTRODUODENOSCOPY (EGD) WITH PROPOFOL;  Surgeon: Jerene Bears, MD;  Location: [facility];  Service: Gastroenterology;  Laterality: N/A;  . IR GENERIC HISTORICAL  07/03/2016   IR RADIOLOGIST EVAL & MGMT 07/03/2016 [name], MD [facility] INTERV RAD  . IR GENERIC HISTORICAL  11/18/2016   IR RADIOLOGIST EVAL & MGMT 11/18/2016 [name], MD [facility] INTERV RAD  . IR RADIOLOGIST EVAL & MGMT  04/09/2017  . IR RADIOLOGIST EVAL & MGMT  11/05/2017  . JOINT  REPLACEMENT    . left shoulder arthroplasty    . right knee replacement  2007  . total knee replacement L  03-13-08  . TUBAL LIGATION      Allergies: Amlodipine; Benazepril hcl; and Codeine  Medications: Prior to Admission medications   Medication Sig Start Date End Date Taking? Authorizing Provider  aspirin EC 81 MG tablet Take 81 mg by mouth daily.    [provider]  BLACK COHOSH PO Take 1 tablet by mouth daily.    [provider]  Cholecalciferol (EQL VITAMIN D3) 1000 UNITS tablet Take 1,000 Units by mouth daily.     [provider]  cyclobenzaprine (FLEXERIL) 5 MG tablet Take 1 tablet (5 mg total) by mouth 3 (three) times daily as needed for muscle spasms. 08/29/17   Ashley Murrain, NP  furosemide (LASIX) 20 MG tablet Take 1-2 tablets (20-40 mg total) by mouth daily. 10/30/17   Plotnikov, Evie Lacks, MD  loratadine (CLARITIN) 10 MG tablet Take 1 tablet (10 mg total) by mouth daily. 01/30/17   Plotnikov, Evie Lacks, MD  losartan (COZAAR) 100 MG tablet Take 1 tablet (100 mg total) by mouth daily. 01/30/17   Plotnikov, Evie Lacks, MD  meloxicam (MOBIC) 7.5 MG tablet TAKE ONE TO TWO TABLETS BY MOUTH ONCE DAILY AS NEEDED FOR PAIN 10/31/17   Plotnikov, Evie Lacks, MD  Multiple Vitamin (MULTIVITAMIN WITH MINERALS) TABS tablet Take 1 tablet by mouth  daily.     [provider]  ondansetron (ZOFRAN) 8 MG tablet Take 8 mg by mouth every 8 (eight) hours as needed for nausea or vomiting. Reported on 07/03/2016 02/22/16   [provider]  pantoprazole (PROTONIX) 40 MG tablet Take 1 tablet (40 mg total) by mouth daily. 01/30/17   Plotnikov, Evie Lacks, MD  phentermine (ADIPEX-P) 37.5 MG tablet TAKE ONE TABLET BY MOUTH ONCE DAILY BEFORE BREAKFAST 05/04/17   Plotnikov, Evie Lacks, MD  potassium chloride SA (K-DUR,KLOR-CON) 20 MEQ tablet Take 1 tablet (20 mEq total) by mouth daily. 01/30/17   Plotnikov, Evie Lacks, MD  traMADol (ULTRAM) 50 MG tablet Take 1-2 tablets (50-100 mg total)  by mouth every 8 (eight) hours as needed. 10/30/17   Plotnikov, Evie Lacks, MD     Family History  Problem Relation Age of Onset  . Hypertension Mother   . Ovarian cancer Mother   . Hypertension Father   . Cancer Father 74       prostate cancer   . Diabetes Other   . Hypertension Other   . Colon cancer Neg Hx   . Esophageal cancer Neg Hx   . Rectal cancer Neg Hx   . Stomach cancer Neg Hx     Social History   Socioeconomic History  . Marital status: Single    Spouse name: Not on file  . Number of children: 4  . Years of education: Not on file  . Highest education level: Not on file  Social Needs  . Financial resource strain: Not hard at all  . Food insecurity - worry: Never true  . Food insecurity - inability: Never true  . Transportation needs - medical: No  . Transportation needs - non-medical: No  Occupational History  . Not on file  Tobacco Use  . Smoking status: Former Smoker    Packs/day: 0.25    Years: 20.00    Pack years: 5.00    Last attempt to quit: 12/30/2007    Years since quitting: 9.9  . Smokeless tobacco: Never Used  Substance and Sexual Activity  . 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 Drug use: No  . Sexual activity: Not Currently  Other Topics Concern  . Not on file  Social History Narrative   Regular exercise: walk a few times a week   Caffeine use: none   Single, raising her young great granddaughter and watches grandchildren after school   Has a room mate to help prn   Retired from nursing tech--worked at [employer]   Independent/drives    ECOG Status: 0 - Asymptomatic  Review of Systems: A 12 point ROS discussed and pertinent positives are indicated in the HPI above.  All other systems are negative.  Review of Systems  Vital Signs: BP (!) 142/88   Pulse 93   Temp 98.2 F (36.8 C) (Oral)   Resp 14   Ht 5\' 3"  (1.6 m)   Wt 290 lb (131.5 kg)    SpO2 91%   BMI 51.37 kg/m   Physical Exam  Constitutional: She is oriented to person, place, and time. She appears well-developed and well-nourished. No distress.  HENT:  Head: Normocephalic and atraumatic.  Eyes: No scleral icterus.  Cardiovascular: Normal rate and regular rhythm.  Pulmonary/Chest: Effort normal and breath sounds normal.  Abdominal: Soft. She exhibits no distension. There is no tenderness.  Neurological: She is alert and oriented to person, place, and time.  Skin: Skin is warm and dry.  Psychiatric: She has a normal mood and affect. Her behavior is normal.  Nursing note and vitals reviewed.   Mallampati Score:     Imaging: Mr Abdomen Wwo Contrast  Result Date: 11/03/2017 CLINICAL DATA:  Followup liver hematoma status post ablation. EXAM: MRI ABDOMEN WITHOUT AND WITH CONTRAST TECHNIQUE: Multiplanar multisequence MR imaging of the abdomen was performed both before and after the administration of intravenous contrast. CONTRAST:  10mL MULTIHANCE GADOBENATE DIMEGLUMINE 529 MG/ML IV SOLN COMPARISON:  04/03/2017 FINDINGS: Lower chest: No acute findings. Hepatobiliary: Nodular contour the liver again noted. Relative hypertrophy of the caudate lobe of liver and lateral segment of left lobe of liver noted. Ablation defect within segment 8 measures 3.0 x 2.4, image 62, image 61 of series 1002. Previously 2.9 x 2.4 cm. There is some heterogeneous T1 shortening on the precontrast imaging likely related to blood products in the ablation bed. Following the IV administration of contrast material there is no unexpected enhancement in the lesion or around the lesion to suggest local recurrence. No new liver lesions identified. Previous cholecystectomy.  No biliary dilatation. Pancreas: No mass, inflammatory changes, or other parenchymal abnormality identified. Spleen:  Within normal limits in size and appearance. Adrenals/Urinary Tract: No masses identified. No evidence of hydronephrosis.  Stomach/Bowel: Visualized portions within the abdomen are unremarkable. Vascular/Lymphatic: No pathologically enlarged lymph nodes identified. No abdominal aortic aneurysm demonstrated. Other:  None. Musculoskeletal: No aggressive lytic or sclerotic bone lesions. IMPRESSION: 1. Stable ablation defect within segment 8 of the liver. No findings to suggest recurrent disease. 2. Cirrhosis of the liver. Electronically Signed   By: Kerby Moors M.D.   On: 11/03/2017 16:09   Ir Radiologist Eval & Mgmt  Result Date: 11/18/2017 Please refer to notes tab for details about interventional procedure. (Op Note)   Labs:  CBC: Recent Labs    03/27/17 1316 10/16/17 1149 10/20/17 1422  WBC 4.3 4.3 4.7  HGB 12.9 12.3 12.7  HCT 41.5 37.3 40.4  PLT 195 200 184.0    COAGS: Recent Labs    03/27/17 1316 10/16/17 1149 10/20/17 1422 10/22/17 1527 10/22/17 1532  INR 1.0 1.0 14.7* 1.0  --   APTT  --   --   --   --  25    BMP: Recent Labs    03/27/17 1316 10/16/17 1149 10/20/17 1422  NA 143 141 140  K 4.3 4.2 4.3  CL 109 108 106  CO2 24 25 26   GLUCOSE 115* 111 95  BUN 15 14 15   CALCIUM 9.5 9.0 9.4  CREATININE 0.88 0.97 0.81  GFRNONAA 68 60  --   GFRAA 79 70  --     LIVER FUNCTION TESTS: Recent Labs    03/27/17 1316 10/16/17 1149 10/20/17 1422  BILITOT 0.3 0.3 0.4  AST 16 14 16   ALT 10 9 10   ALKPHOS 91  --  91  PROT 6.7 6.6 7.1  ALBUMIN 3.7  --  4.0    TUMOR MARKERS: Recent Labs    10/16/17 1149 10/22/17 0804  AFPTM 7.0* 7.3*    Assessment and Plan:  Continues to do very well and is now 18 months cancer free.   1.) Repeat MRI and return clinic visit in 6 months.  After that, we will transition to annual follow up.    Electronically Signed: [name] 11/25/2017, 5:13 PM   I spent a total of  15 Minutes in face to face in clinical consultation, greater than 50% of which was counseling/coordinating care for [facility].

## 2018-01-31 ENCOUNTER — Other Ambulatory Visit: Payer: Self-pay | Admitting: Internal Medicine

## 2018-02-05 ENCOUNTER — Ambulatory Visit: Payer: Medicare Other | Admitting: Internal Medicine

## 2018-02-09 DIAGNOSIS — C220 Liver cell carcinoma: Secondary | ICD-10-CM | POA: Diagnosis not present

## 2018-02-09 DIAGNOSIS — K7469 Other cirrhosis of liver: Secondary | ICD-10-CM | POA: Diagnosis not present

## 2018-02-12 ENCOUNTER — Ambulatory Visit (INDEPENDENT_AMBULATORY_CARE_PROVIDER_SITE_OTHER): Payer: Medicare Other | Admitting: Internal Medicine

## 2018-02-12 ENCOUNTER — Encounter: Payer: Self-pay | Admitting: Internal Medicine

## 2018-02-12 DIAGNOSIS — G8929 Other chronic pain: Secondary | ICD-10-CM | POA: Diagnosis not present

## 2018-02-12 DIAGNOSIS — C220 Liver cell carcinoma: Secondary | ICD-10-CM

## 2018-02-12 DIAGNOSIS — I10 Essential (primary) hypertension: Secondary | ICD-10-CM | POA: Diagnosis not present

## 2018-02-12 DIAGNOSIS — M5440 Lumbago with sciatica, unspecified side: Secondary | ICD-10-CM

## 2018-02-12 MED ORDER — ZOSTER VAC RECOMB ADJUVANTED 50 MCG/0.5ML IM SUSR: 0.5000 mL | Freq: Once | INTRAMUSCULAR | 1 refills | Status: AC

## 2018-02-12 MED ORDER — IBUPROFEN 600 MG PO TABS: ORAL_TABLET | ORAL | 3 refills | Status: DC

## 2018-02-12 NOTE — Assessment & Plan Note (Signed)
Labs/scans - pending by Dr Laurence Ferrari (IR)

## 2018-02-12 NOTE — Patient Instructions (Signed)
Wt Readings from Last 3 Encounters:  02/12/18 292 lb (132.5 kg)  11/11/17 299 lb (135.6 kg)  11/05/17 290 lb (131.5 kg)

## 2018-02-12 NOTE — Progress Notes (Signed)
Subjective:  Patient ID: Samantha [name], female    DOB: Sep 02, 1951  Age: 67 y.o. MRN: [id number]  CC: No chief complaint on file.   HPI Samantha [name] presents for LBP, hot flashes, OA, liver cell ca f/u  Outpatient Medications Prior to Visit  Medication Sig Dispense Refill  . aspirin EC 81 MG tablet Take 81 mg by mouth daily.    Marland Kitchen BLACK COHOSH PO Take 1 tablet by mouth daily.    . Cholecalciferol (EQL VITAMIN D3) 1000 UNITS tablet Take 1,000 Units by mouth daily.     . cyclobenzaprine (FLEXERIL) 5 MG tablet Take 1 tablet (5 mg total) by mouth 3 (three) times daily as needed for muscle spasms. 30 tablet 0  . furosemide (LASIX) 20 MG tablet Take 1-2 tablets (20-40 mg total) by mouth daily. 180 tablet 3  . loratadine (CLARITIN) 10 MG tablet Take 1 tablet (10 mg total) by mouth daily. 100 tablet 3  . losartan (COZAAR) 100 MG tablet Take 1 tablet (100 mg total) by mouth daily. 90 tablet 3  . meloxicam (MOBIC) 7.5 MG tablet TAKE ONE TO TWO TABLETS BY MOUTH ONCE DAILY AS NEEDED FOR PAIN 60 tablet 1  . Multiple Vitamin (MULTIVITAMIN WITH MINERALS) TABS tablet Take 1 tablet by mouth daily.     . ondansetron (ZOFRAN) 8 MG tablet Take 8 mg by mouth every 8 (eight) hours as needed for nausea or vomiting. Reported on 07/03/2016    . pantoprazole (PROTONIX) 40 MG tablet TAKE ONE TABLET BY MOUTH ONCE DAILY 90 tablet 3  . phentermine (ADIPEX-P) 37.5 MG tablet TAKE ONE TABLET BY MOUTH ONCE DAILY BEFORE BREAKFAST 30 tablet 2  . potassium chloride SA (K-DUR,KLOR-CON) 20 MEQ tablet Take 1 tablet (20 mEq total) by mouth daily. 90 tablet 3  . traMADol (ULTRAM) 50 MG tablet Take 1-2 tablets (50-100 mg total) by mouth every 8 (eight) hours as needed. 120 tablet 2   No facility-administered medications prior to visit.     ROS Review of Systems  Constitutional: Negative for activity change, appetite change, chills, fatigue and unexpected weight change.  HENT: Negative for congestion, mouth sores and sinus  pressure.   Eyes: Negative for visual disturbance.  Respiratory: Negative for cough and chest tightness.   Gastrointestinal: Negative for abdominal pain and nausea.  Genitourinary: Negative for difficulty urinating, frequency and vaginal pain.  Musculoskeletal: Positive for arthralgias and back pain. Negative for gait problem.  Skin: Negative for pallor and rash.  Neurological: Negative for dizziness, tremors, weakness, numbness and headaches.  Psychiatric/Behavioral: Negative for confusion and sleep disturbance.    Objective:  BP 128/78 (BP Location: Left Arm, Patient Position: Sitting, Cuff Size: Large)   Pulse 82   Temp 98.4 F (36.9 C) (Oral)   Ht 5\' 3"  (1.6 m)   Wt 292 lb (132.5 kg)   SpO2 98%   BMI 51.73 kg/m   BP Readings from Last 3 Encounters:  02/12/18 128/78  11/11/17 132/68  11/05/17 (!) 142/88    Wt Readings from Last 3 Encounters:  02/12/18 292 lb (132.5 kg)  11/11/17 299 lb (135.6 kg)  11/05/17 290 lb (131.5 kg)    Physical Exam  Constitutional: She appears well-developed. No distress.  HENT:  Head: Normocephalic.  Right Ear: External ear normal.  Left Ear: External ear normal.  Nose: Nose normal.  Mouth/Throat: Oropharynx is clear and moist.  Eyes: Conjunctivae are normal. Pupils are equal, round, and reactive to light. Right eye exhibits no discharge.  Left eye exhibits no discharge.  Neck: Normal range of motion. Neck supple. No JVD present. No tracheal deviation present. No thyromegaly present.  Cardiovascular: Normal rate, regular rhythm and normal heart sounds.  Pulmonary/Chest: No stridor. No respiratory distress. She has no wheezes.  Abdominal: Soft. Bowel sounds are normal. She exhibits no distension and no mass. There is no tenderness. There is no rebound and no guarding.  Musculoskeletal: She exhibits tenderness. She exhibits no edema.  Lymphadenopathy:    She has no cervical adenopathy.  Neurological: She displays normal reflexes. No cranial  nerve deficit. She exhibits normal muscle tone. Coordination normal.  Skin: No rash noted. No erythema.  Psychiatric: She has a normal mood and affect. Her behavior is normal. Judgment and thought content normal.  Obese  Lab Results  Component Value Date   WBC 4.7 10/20/2017   HGB 12.7 10/20/2017   HCT 40.4 10/20/2017   PLT 184.0 10/20/2017   GLUCOSE 95 10/20/2017   CHOL 154 10/20/2017   TRIG 132.0 10/20/2017   HDL 44.80 10/20/2017   LDLCALC 82 10/20/2017   ALT 10 10/20/2017   AST 16 10/20/2017   NA 140 10/20/2017   K 4.3 10/20/2017   CL 106 10/20/2017   CREATININE 0.81 10/20/2017   BUN 15 10/20/2017   CO2 26 10/20/2017   TSH 1.91 10/20/2017   INR 1.0 10/22/2017   HGBA1C 5.8 08/14/2015    Ir Radiologist Eval & Mgmt  Result Date: 11/18/2017 Please refer to notes tab for details about interventional procedure. (Op Note)   Assessment & Plan:   There are no diagnoses linked to this encounter. I am having Samantha [name]. Samantha [name] maintain her Cholecalciferol, multivitamin with minerals, ondansetron, BLACK COHOSH PO, aspirin EC, potassium chloride SA, losartan, loratadine, phentermine, cyclobenzaprine, meloxicam, furosemide, traMADol, and pantoprazole.  No orders of the defined types were placed in this encounter.    Follow-up: No Follow-up on file.  Walker Kehr, MD

## 2018-02-12 NOTE — Assessment & Plan Note (Signed)
Lost wt °

## 2018-02-12 NOTE — Assessment & Plan Note (Signed)
Losartan, Norvasc, Lasix 

## 2018-02-12 NOTE — Assessment & Plan Note (Signed)
Ibuprofen prn w/caution No esoph varicies

## 2018-02-22 ENCOUNTER — Other Ambulatory Visit: Payer: Self-pay | Admitting: Internal Medicine

## 2018-03-28 ENCOUNTER — Other Ambulatory Visit: Payer: Self-pay | Admitting: Internal Medicine

## 2018-04-23 ENCOUNTER — Other Ambulatory Visit: Payer: Self-pay | Admitting: Internal Medicine

## 2018-04-23 DIAGNOSIS — Z1231 Encounter for screening mammogram for malignant neoplasm of breast: Secondary | ICD-10-CM

## 2018-05-12 ENCOUNTER — Other Ambulatory Visit: Payer: Self-pay | Admitting: *Deleted

## 2018-05-12 ENCOUNTER — Other Ambulatory Visit (HOSPITAL_COMMUNITY): Payer: Self-pay | Admitting: Interventional Radiology

## 2018-05-12 DIAGNOSIS — C220 Liver cell carcinoma: Secondary | ICD-10-CM

## 2018-05-14 ENCOUNTER — Ambulatory Visit (INDEPENDENT_AMBULATORY_CARE_PROVIDER_SITE_OTHER): Payer: Medicare Other | Admitting: Internal Medicine

## 2018-05-14 ENCOUNTER — Other Ambulatory Visit (INDEPENDENT_AMBULATORY_CARE_PROVIDER_SITE_OTHER): Payer: Medicare Other

## 2018-05-14 ENCOUNTER — Encounter: Payer: Self-pay | Admitting: Internal Medicine

## 2018-05-14 VITALS — BP 132/82 | HR 81 | Temp 98.8°F | Ht 63.0 in | Wt 294.0 lb

## 2018-05-14 DIAGNOSIS — C220 Liver cell carcinoma: Secondary | ICD-10-CM | POA: Diagnosis not present

## 2018-05-14 DIAGNOSIS — N939 Abnormal uterine and vaginal bleeding, unspecified: Secondary | ICD-10-CM | POA: Insufficient documentation

## 2018-05-14 DIAGNOSIS — I10 Essential (primary) hypertension: Secondary | ICD-10-CM | POA: Diagnosis not present

## 2018-05-14 DIAGNOSIS — K7460 Unspecified cirrhosis of liver: Secondary | ICD-10-CM

## 2018-05-14 LAB — CBC WITH DIFFERENTIAL/PLATELET
BASOS ABS: 0 10*3/uL (ref 0.0–0.1)
Basophils Relative: 0.8 % (ref 0.0–3.0)
EOS PCT: 3.5 % (ref 0.0–5.0)
Eosinophils Absolute: 0.1 10*3/uL (ref 0.0–0.7)
HEMATOCRIT: 38.5 % (ref 36.0–46.0)
Hemoglobin: 12.2 g/dL (ref 12.0–15.0)
LYMPHS PCT: 34.7 % (ref 12.0–46.0)
Lymphs Abs: 1.4 10*3/uL (ref 0.7–4.0)
MCHC: 31.8 g/dL (ref 30.0–36.0)
MCV: 82.7 fl (ref 78.0–100.0)
MONOS PCT: 11.5 % (ref 3.0–12.0)
Monocytes Absolute: 0.5 10*3/uL (ref 0.1–1.0)
NEUTROS PCT: 49.5 % (ref 43.0–77.0)
Neutro Abs: 2 10*3/uL (ref 1.4–7.7)
Platelets: 199 10*3/uL (ref 150.0–400.0)
RBC: 4.66 Mil/uL (ref 3.87–5.11)
RDW: 14.3 % (ref 11.5–15.5)
WBC: 4 10*3/uL (ref 4.0–10.5)

## 2018-05-14 LAB — URINALYSIS, ROUTINE W REFLEX MICROSCOPIC
BILIRUBIN URINE: NEGATIVE
HGB URINE DIPSTICK: NEGATIVE
Ketones, ur: NEGATIVE
Nitrite: NEGATIVE
Specific Gravity, Urine: >=1.03 — AB (ref 1.000–1.030)
TOTAL PROTEIN, URINE-UPE24: NEGATIVE
URINE GLUCOSE: NEGATIVE
UROBILINOGEN UA: 0.2 (ref 0.0–1.0)
pH: 6 (ref 5.0–8.0)

## 2018-05-14 LAB — HEPATIC FUNCTION PANEL
ALBUMIN: 4 g/dL (ref 3.5–5.2)
ALK PHOS: 86 U/L (ref 39–117)
ALT: 10 U/L (ref 0–35)
AST: 17 U/L (ref 0–37)
Bilirubin, Direct: 0.1 mg/dL (ref 0.0–0.3)
TOTAL PROTEIN: 7.5 g/dL (ref 6.0–8.3)
Total Bilirubin: 0.5 mg/dL (ref 0.2–1.2)

## 2018-05-14 LAB — BASIC METABOLIC PANEL
BUN: 13 mg/dL (ref 6–23)
CALCIUM: 9.3 mg/dL (ref 8.4–10.5)
CHLORIDE: 109 meq/L (ref 96–112)
CO2: 26 meq/L (ref 19–32)
CREATININE: 0.8 mg/dL (ref 0.40–1.20)
GFR: 91.57 mL/min (ref >60.00)
Glucose, Bld: 92 mg/dL (ref 70–99)
Potassium: 3.9 mEq/L (ref 3.5–5.1)
Sodium: 142 mEq/L (ref 135–145)

## 2018-05-14 LAB — PROTIME-INR
INR: 1 ratio (ref 0.8–1.0)
PROTHROMBIN TIME: 12 s (ref 9.6–13.1)

## 2018-05-14 MED ORDER — MELOXICAM 7.5 MG PO TABS: ORAL_TABLET | ORAL | 1 refills | Status: DC

## 2018-05-14 MED ORDER — LORATADINE 10 MG PO TABS: 10.0000 mg | ORAL_TABLET | Freq: Every day | ORAL | 3 refills | Status: DC

## 2018-05-14 MED ORDER — PANTOPRAZOLE SODIUM 40 MG PO TBEC: 40.0000 mg | DELAYED_RELEASE_TABLET | Freq: Every day | ORAL | 3 refills | Status: DC

## 2018-05-14 MED ORDER — LOSARTAN POTASSIUM 100 MG PO TABS: 100.0000 mg | ORAL_TABLET | Freq: Every day | ORAL | 3 refills | Status: DC

## 2018-05-14 NOTE — Progress Notes (Signed)
Subjective:  Patient ID: Samantha [name], female    DOB: 1951/04/26  Age: 67 y.o. MRN: [id number]  CC: No chief complaint on file.   HPI MISCHELL BRANFORD presents for vaginal spotting since Tuesday  F/u liver cancer, knee OA     Outpatient Medications Prior to Visit  Medication Sig Dispense Refill  . aspirin EC 81 MG tablet Take 81 mg by mouth daily.    Marland Kitchen BLACK COHOSH PO Take 1 tablet by mouth daily.    . Cholecalciferol (EQL VITAMIN D3) 1000 UNITS tablet Take 1,000 Units by mouth daily.     . cyclobenzaprine (FLEXERIL) 5 MG tablet Take 1 tablet (5 mg total) by mouth 3 (three) times daily as needed for muscle spasms. 30 tablet 0  . furosemide (LASIX) 20 MG tablet Take 1-2 tablets (20-40 mg total) by mouth daily. 180 tablet 3  . ibuprofen (ADVIL,MOTRIN) 600 MG tablet Take twice a day prn pain 60 tablet 3  . loratadine (CLARITIN) 10 MG tablet TAKE ONE TABLET BY MOUTH ONCE DAILY 30 tablet 11  . losartan (COZAAR) 100 MG tablet TAKE ONE TABLET BY MOUTH ONCE DAILY 90 tablet 3  . meloxicam (MOBIC) 7.5 MG tablet TAKE 1 TO 2 TABLETS BY MOUTH ONCE DAILY AS NEEDED FOR PAIN 60 tablet 1  . Multiple Vitamin (MULTIVITAMIN WITH MINERALS) TABS tablet Take 1 tablet by mouth daily.     . ondansetron (ZOFRAN) 8 MG tablet Take 8 mg by mouth every 8 (eight) hours as needed for nausea or vomiting. Reported on 07/03/2016    . pantoprazole (PROTONIX) 40 MG tablet TAKE ONE TABLET BY MOUTH ONCE DAILY 90 tablet 3  . phentermine (ADIPEX-P) 37.5 MG tablet TAKE ONE TABLET BY MOUTH ONCE DAILY BEFORE BREAKFAST 30 tablet 2  . potassium chloride SA (K-DUR,KLOR-CON) 20 MEQ tablet Take 1 tablet (20 mEq total) by mouth daily. 90 tablet 3   No facility-administered medications prior to visit.     ROS Review of Systems  Constitutional: Negative for activity change, appetite change, chills, fatigue and unexpected weight change.  HENT: Negative for congestion, mouth sores and sinus pressure.   Eyes: Negative for visual  disturbance.  Respiratory: Negative for cough and chest tightness.   Gastrointestinal: Negative for abdominal pain and nausea.  Genitourinary: Negative for difficulty urinating, frequency and vaginal pain.  Musculoskeletal: Positive for arthralgias, back pain and gait problem.  Skin: Negative for pallor and rash.  Neurological: Negative for dizziness, tremors, weakness, numbness and headaches.  Psychiatric/Behavioral: Negative for confusion and sleep disturbance.    Objective:  BP 132/82 (BP Location: Left Arm, Patient Position: Sitting, Cuff Size: Large)   Pulse 81   Temp 98.8 F (37.1 C) (Oral)   Ht 5\' 3"  (1.6 m)   Wt 294 lb (133.4 kg)   SpO2 99%   BMI 52.08 kg/m   BP Readings from Last 3 Encounters:  05/14/18 132/82  02/12/18 128/78  11/11/17 132/68    Wt Readings from Last 3 Encounters:  05/14/18 294 lb (133.4 kg)  02/12/18 292 lb (132.5 kg)  11/11/17 299 lb (135.6 kg)    Physical Exam  Constitutional: She appears well-developed. No distress.  HENT:  Head: Normocephalic.  Right Ear: External ear normal.  Left Ear: External ear normal.  Nose: Nose normal.  Mouth/Throat: Oropharynx is clear and moist.  Eyes: Pupils are equal, round, and reactive to light. Conjunctivae are normal. Right eye exhibits no discharge. Left eye exhibits no discharge.  Neck: Normal range of  motion. Neck supple. No JVD present. No tracheal deviation present. No thyromegaly present.  Cardiovascular: Normal rate, regular rhythm and normal heart sounds.  Pulmonary/Chest: No stridor. No respiratory distress. She has no wheezes.  Abdominal: Soft. Bowel sounds are normal. She exhibits no distension and no mass. There is no tenderness. There is no rebound and no guarding.  Musculoskeletal: She exhibits no edema or tenderness.  Lymphadenopathy:    She has no cervical adenopathy.  Neurological: She displays normal reflexes. No cranial nerve deficit. She exhibits normal muscle tone. Coordination  normal.  Skin: No rash noted. No erythema.  Psychiatric: She has a normal mood and affect. Her behavior is normal. Judgment and thought content normal.   Obese LS/knees - tender  Lab Results  Component Value Date   WBC 4.7 10/20/2017   HGB 12.7 10/20/2017   HCT 40.4 10/20/2017   PLT 184.0 10/20/2017   GLUCOSE 95 10/20/2017   CHOL 154 10/20/2017   TRIG 132.0 10/20/2017   HDL 44.80 10/20/2017   LDLCALC 82 10/20/2017   ALT 10 10/20/2017   AST 16 10/20/2017   NA 140 10/20/2017   K 4.3 10/20/2017   CL 106 10/20/2017   CREATININE 0.81 10/20/2017   BUN 15 10/20/2017   CO2 26 10/20/2017   TSH 1.91 10/20/2017   INR 1.0 10/22/2017   HGBA1C 5.8 08/14/2015    Ir Radiologist Eval & Mgmt  Result Date: 11/18/2017 Please refer to notes tab for details about interventional procedure. (Op Note)   Assessment & Plan:   There are no diagnoses linked to this encounter. I am having Altheia Shafran. Sweatt maintain her Cholecalciferol, multivitamin with minerals, ondansetron, BLACK COHOSH PO, aspirin EC, potassium chloride SA, phentermine, cyclobenzaprine, furosemide, pantoprazole, ibuprofen, loratadine, losartan, and meloxicam.  No orders of the defined types were placed in this encounter.    Follow-up: No follow-ups on file.  Walker Kehr, MD

## 2018-05-14 NOTE — Assessment & Plan Note (Signed)
Losartan, Norvasc, Lasix 

## 2018-05-14 NOTE — Assessment & Plan Note (Signed)
Yearly f/u w/ Dr Laurence Ferrari (IR)

## 2018-05-14 NOTE — Assessment & Plan Note (Signed)
Wt Readings from Last 3 Encounters:  05/14/18 294 lb (133.4 kg)  02/12/18 292 lb (132.5 kg)  11/11/17 299 lb (135.6 kg)

## 2018-05-14 NOTE — Assessment & Plan Note (Signed)
F/u w/Dr Pyrtle 

## 2018-05-14 NOTE — Assessment & Plan Note (Signed)
appt w/Dr Raphael Gibney

## 2018-05-26 DIAGNOSIS — Z01419 Encounter for gynecological examination (general) (routine) without abnormal findings: Secondary | ICD-10-CM | POA: Diagnosis not present

## 2018-05-26 DIAGNOSIS — N950 Postmenopausal bleeding: Secondary | ICD-10-CM | POA: Diagnosis not present

## 2018-05-27 DIAGNOSIS — C220 Liver cell carcinoma: Secondary | ICD-10-CM | POA: Diagnosis not present

## 2018-05-28 ENCOUNTER — Ambulatory Visit: Payer: Medicare Other

## 2018-05-28 ENCOUNTER — Ambulatory Visit (HOSPITAL_COMMUNITY)
Admission: RE | Admit: 2018-05-28 | Discharge: 2018-05-28 | Disposition: A | Discharge status: Home / Self Care | Payer: Medicare Other | Source: Ambulatory Visit | Attending: Interventional Radiology | Admitting: Interventional Radiology

## 2018-05-28 DIAGNOSIS — Z08 Encounter for follow-up examination after completed treatment for malignant neoplasm: Secondary | ICD-10-CM | POA: Diagnosis not present

## 2018-05-28 DIAGNOSIS — C220 Liver cell carcinoma: Secondary | ICD-10-CM

## 2018-05-28 DIAGNOSIS — Z8505 Personal history of malignant neoplasm of liver: Secondary | ICD-10-CM | POA: Diagnosis not present

## 2018-05-28 LAB — COMPLETE METABOLIC PANEL WITH GFR
AG Ratio: 1.4 (calc) (ref 1.0–2.5)
ALKALINE PHOSPHATASE (APISO): 105 U/L (ref 33–130)
ALT: 9 U/L (ref 6–29)
AST: 14 U/L (ref 10–35)
Albumin: 4.1 g/dL (ref 3.6–5.1)
BUN: 19 mg/dL (ref 7–25)
CO2: 25 mmol/L (ref 20–32)
CREATININE: 0.85 mg/dL (ref 0.50–0.99)
Calcium: 9.2 mg/dL (ref 8.6–10.4)
Chloride: 111 mmol/L — ABNORMAL HIGH (ref 98–110)
GFR, EST NON AFRICAN AMERICAN: 70 mL/min/{1.73_m2} (ref >=60)
GFR, Est African American: 82 mL/min/{1.73_m2} (ref >=60)
GLOBULIN: 2.9 g/dL (ref 1.9–3.7)
Glucose, Bld: 89 mg/dL (ref 65–99)
Potassium: 4.4 mmol/L (ref 3.5–5.3)
SODIUM: 144 mmol/L (ref 135–146)
Total Bilirubin: 0.3 mg/dL (ref 0.2–1.2)
Total Protein: 7 g/dL (ref 6.1–8.1)

## 2018-05-28 LAB — CBC
HCT: 36.8 % (ref 35.0–45.0)
HEMOGLOBIN: 12 g/dL (ref 11.7–15.5)
MCH: 26.5 pg — AB (ref 27.0–33.0)
MCHC: 32.6 g/dL (ref 32.0–36.0)
MCV: 81.4 fL (ref 80.0–100.0)
MPV: 11 fL (ref 7.5–12.5)
PLATELETS: 195 10*3/uL (ref 140–400)
RBC: 4.52 10*6/uL (ref 3.80–5.10)
RDW: 13 % (ref 11.0–15.0)
WBC: 4.4 10*3/uL (ref 3.8–10.8)

## 2018-05-28 LAB — PROTIME-INR
INR: 1
Prothrombin Time: 10.1 s (ref 9.0–11.5)

## 2018-05-28 LAB — AFP TUMOR MARKER: AFP TUMOR MARKER: 6.4 ng/mL — AB

## 2018-05-28 MED ORDER — GADOBENATE DIMEGLUMINE 529 MG/ML IV SOLN
20.0000 mL | Freq: Once | INTRAVENOUS | Status: AC | PRN
  Administered 2018-05-28: 20 mL via INTRAVENOUS

## 2018-06-03 ENCOUNTER — Ambulatory Visit
Admission: RE | Admit: 2018-06-03 | Discharge: 2018-06-03 | Disposition: A | Discharge status: Home / Self Care | Payer: Medicare Other | Source: Ambulatory Visit | Attending: Interventional Radiology | Admitting: Interventional Radiology

## 2018-06-03 DIAGNOSIS — C220 Liver cell carcinoma: Secondary | ICD-10-CM

## 2018-06-03 NOTE — Progress Notes (Signed)
Chief Complaint: Patient was seen in follow up today for  Chief Complaint  Patient presents with  . Follow-up    2 yr follow up [name]   at the request of [place]  Referring Physician(s): Truitt Merle  History of Present Illness: Samantha [name] is a 67 y.o. 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  She underwent conventional lipiodol transarterial chemo embolization on 2/23 and then subsequent CT guided thermal ablation of this lesion on 03/21/2016.  She presents today for continued follow-up evaluation.  Her most recent MR imaging from 05/28/2018 demonstrates no evidence of residual or recurrent disease.  She is now greater than 2 years out and remains disease-free.  She is feeling well and has no complaints including abdominal pain, nausea, vomiting, diarrhea, chest pain or shortness of breath.  Past Medical History:  Diagnosis Date  . Allergy    seasonal  . Cancer ([place]) 2017   liver cancer  . Elevated glucose    no longer an issue 03-18-16  . GERD (gastroesophageal reflux disease)   . H/O seasonal allergies   . Hepatitis C    hepatitis c completed march 2017  . Hyperlipidemia 2011  . Hypertension   . LBP (low back pain)   . Obesity   . Osteoarthritis    B knees    Past Surgical History:  Procedure Laterality Date  . CHOLECYSTECTOMY    . COLONOSCOPY N/A 01/09/2015   Procedure: COLONOSCOPY;  Surgeon: Jerene Bears, MD;  Location: [facility];  Service: Gastroenterology;  Laterality: N/A;  . ESOPHAGOGASTRODUODENOSCOPY (EGD) WITH PROPOFOL N/A 06/10/2016   Procedure: ESOPHAGOGASTRODUODENOSCOPY (EGD) WITH PROPOFOL;  Surgeon: Jerene Bears, MD;  Location: [facility];  Service: Gastroenterology;  Laterality: N/A;  . IR GENERIC HISTORICAL  07/03/2016   IR RADIOLOGIST EVAL & MGMT 07/03/2016 [name], MD [facility] INTER[facility] RAD  . IR GENERIC HISTORICAL   11/18/2016   IR RADIOLOGIST EVAL & MGMT 11/18/2016 [name], MD [facility] INTERV RAD  . IR RADIOLOGIST EVAL & MGMT  04/09/2017  . IR RADIOLOGIST EVAL & MGMT  11/05/2017  . JOINT REPLACEMENT    . left shoulder arthroplasty    . right knee replacement  2007  . total knee replacement L  03-13-08  . TUBAL LIGATION      Allergies: Amlodipine; Benazepril hcl; and Codeine  Medications: Prior to Admission medications   Medication Sig Start Date End Date Taking? Authorizing Provider  aspirin EC 81 MG tablet Take 81 mg by mouth daily.    [provider]  BLACK COHOSH PO Take 1 tablet by mouth daily.    [provider]  Cholecalciferol (EQL VITAMIN D3) 1000 UNITS tablet Take 1,000 Units by mouth daily.     [provider]  cyclobenzaprine (FLEXERIL) 5 MG tablet Take 1 tablet (5 mg total) by mouth 3 (three) times daily as needed for muscle spasms. 08/29/17   Ashley Murrain, NP  furosemide (LASIX) 20 MG tablet Take 1-2 tablets (20-40 mg total) by mouth daily. 10/30/17   Plotnikov, Evie Lacks, MD  ibuprofen (ADVIL,MOTRIN) 600 MG tablet Take twice a day prn pain 02/12/18   Plotnikov, Evie Lacks, MD  loratadine (CLARITIN) 10 MG tablet Take 1 tablet (10 mg total) by mouth daily. 05/14/18   Plotnikov, Evie Lacks, MD  losartan (COZAAR) 100 MG tablet Take 1 tablet (100 mg total) by mouth daily. 05/14/18  Plotnikov, Evie Lacks, MD  meloxicam (MOBIC) 7.5 MG tablet TAKE 1 TO 2 TABLETS BY MOUTH ONCE DAILY AS NEEDED FOR PAIN 05/14/18   Plotnikov, Evie Lacks, MD  Multiple Vitamin (MULTIVITAMIN WITH MINERALS) TABS tablet Take 1 tablet by mouth daily.     [provider]  ondansetron (ZOFRAN) 8 MG tablet Take 8 mg by mouth every 8 (eight) hours as needed for nausea or vomiting. Reported on 07/03/2016 02/22/16   [provider]  pantoprazole (PROTONIX) 40 MG tablet Take 1 tablet (40 mg total) by mouth daily. 05/14/18   Plotnikov, Evie Lacks, MD  phentermine (ADIPEX-P) 37.5 MG tablet  TAKE ONE TABLET BY MOUTH ONCE DAILY BEFORE BREAKFAST 05/04/17   Plotnikov, Evie Lacks, MD  potassium chloride SA (K-DUR,KLOR-CON) 20 MEQ tablet Take 1 tablet (20 mEq total) by mouth daily. 01/30/17   Plotnikov, Evie Lacks, MD     Family History  Problem Relation Age of Onset  . Hypertension Mother   . Ovarian cancer Mother   . Hypertension Father   . Cancer Father 55       prostate cancer   . Diabetes Other   . Hypertension Other   . Colon cancer Neg Hx   . Esophageal cancer Neg Hx   . Rectal cancer Neg Hx   . Stomach cancer Neg Hx     Social History   Socioeconomic History  . Marital status: Single    Spouse name: Not on file  . Number of children: 4  . Years of education: Not on file  . Highest education level: Not on file  Occupational History  . Not on file  Social Needs  . Financial resource strain: Not hard at all  . Food insecurity:    Worry: Never true    Inability: Never true  . Transportation needs:    Medical: No    Non-medical: No  Tobacco Use  . Smoking status: Former Smoker    Packs/day: 0.25    Years: 20.00    Pack years: 5.00    Last attempt to quit: 12/30/2007    Years since quitting: 10.4  . Smokeless tobacco: Never Used  Substance and Sexual Activity  . 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 Drug use: No  . Sexual activity: Not Currently  Lifestyle  . Physical activity:    Days per week: 3 days    Minutes per session: 30 min  . Stress: Not at all  Relationships  . Social connections:    Talks on phone: More than three times a week    Gets together: More than three times a week    Attends religious service: More than 4 times per year    Active member of club or organization: Yes    Attends meetings of clubs or organizations: More than 4 times per year    Relationship status: Not on file  Other Topics Concern  . Not on file  Social History Narrative   Regular  exercise: walk a few times a week   Caffeine use: none   Single, raising her young great granddaughter and watches grandchildren after school   Has a room mate to help prn   Retired from nursing tech--worked at [employer]   Independent/drives    ECOG Status: 0 - Asymptomatic  Review of Systems: A 12 point ROS discussed and pertinent positives are indicated in  the HPI above.  All other systems are negative.  Review of Systems  Vital Signs: BP (!) 182/102   Pulse (!) 102   Temp 98.1 F (36.7 C) (Oral)   Resp 18   SpO2 100%   Physical Exam  Constitutional: She is oriented to person, place, and time. She appears well-developed and well-nourished. No distress.  HENT:  Head: Normocephalic and atraumatic.  Eyes: No scleral icterus.  Cardiovascular: Regular rhythm. Tachycardia present.  Pulmonary/Chest: Effort normal.  Abdominal: Soft. She exhibits no distension. There is no tenderness.  Neurological: She is alert and oriented to person, place, and time.  Skin: Skin is warm and dry.  Psychiatric: She has a normal mood and affect. Her behavior is normal.  Nursing note and vitals reviewed.    Imaging: Mr Abdomen Wwo Contrast  Result Date: 05/28/2018 CLINICAL DATA:  Followup thermal ablation of hepatocellular carcinoma in 2017. EXAM: MRI ABDOMEN WITHOUT AND WITH CONTRAST TECHNIQUE: Multiplanar multisequence MR imaging of the abdomen was performed both before and after the administration of intravenous contrast. CONTRAST:  96mL MULTIHANCE GADOBENATE DIMEGLUMINE 529 MG/ML IV SOLN COMPARISON:  Multiple prior MR examinations. The most recent are from November 2018 and April 2018. FINDINGS: Lower chest: The lung bases are clear. No worrisome pulmonary lesions. The heart is within normal limits in size. No pericardial effusion. Hepatobiliary: Stable thermal ablation defect involving segment 5 of the liver. Stable internal T1 signal abnormality likely old hemorrhage. No worrisome  enhancement. No perilesional enhancement. Lesion measures 3.2 x 2.4 cm and is unchanged. No new early arterial phase enhancing hepatic lesions to suggest dysplastic nodule or new HCC. Pancreas:  No mass, inflammation or ductal dilatation. Spleen:  Normal size.  No focal lesions. Adrenals/Urinary Tract:  The adrenal glands and kidneys are normal. Stomach/Bowel: Visualized portions within the abdomen are unremarkable. Vascular/Lymphatic: No pathologically enlarged lymph nodes identified. No abdominal aortic aneurysm demonstrated. Other:  No ascites or abdominal wall hernia. Musculoskeletal: No significant bony findings. IMPRESSION: 1. Stable thermal ablation defect of a prior [place]. No findings suspicious for recurrent tumor. 2. No new early arterial phase enhancing lesions to suggest dysplastic nodule or HCC. 3. No abdominal lymphadenopathy. Stable small scattered upper abdominal lymph nodes typically seen with cirrhosis. Electronically Signed   By: Marijo Sanes M.D.   On: 05/28/2018 16:16    Labs:  CBC: Recent Labs    10/16/17 1149 10/20/17 1422 05/14/18 1535 05/27/18 1531  WBC 4.3 4.7 4.0 4.4  HGB 12.3 12.7 12.2 12.0  HCT 37.3 40.4 38.5 36.8  PLT 200 184.0 199.0 195    COAGS: Recent Labs    10/20/17 1422 10/22/17 1527 10/22/17 1532 05/14/18 1535 05/27/18 1531  INR 14.7* 1.0  --  1.0 1.0  APTT  --   --  25  --   --     BMP: Recent Labs    10/16/17 1149 10/20/17 1422 05/14/18 1535 05/27/18 1531  NA 141 140 142 144  K 4.2 4.3 3.9 4.4  CL 108 106 109 111*  CO2 25 26 26 25   GLUCOSE 111 95 92 89  BUN 14 15 13 19   CALCIUM 9.0 9.4 9.3 9.2  CREATININE 0.97 0.81 0.80 0.85  GFRNONAA 60  --   --  70  GFRAA 70  --   --  82    LIVER FUNCTION TESTS: Recent Labs    10/16/17 1149 10/20/17 1422 05/14/18 1535 05/27/18 1531  BILITOT 0.3 0.4 0.5 0.3  AST 14 16 17  14  ALT 9 10 10 9   ALKPHOS  --  91 86  --   PROT 6.6 7.1 7.5 7.0  ALBUMIN  --  4.0 4.0  --     TUMOR  MARKERS: Recent Labs    10/16/17 1149 10/22/17 0804 05/27/18 1531  AFPTM 7.0* 7.3* 6.4*    Assessment and Plan:  Continuing to do exceptionally well 2 years and 3 months status post combined transarterial chemoembolization and percutaneous microwave ablation of 8 hepatocellular carcinoma.  She has no imaging evidence of residual or recurrent disease.  1.)  Repeat liver MRI and clinic visit in March 2020.   Electronically Signed: [name] 06/03/2018, 3:50 PM   I spent a total of  15 Minutes in face to face in clinical consultation, greater than 50% of which was counseling/coordinating care for hepatocellular cancer.

## 2018-06-06 ENCOUNTER — Other Ambulatory Visit: Payer: Self-pay | Admitting: Internal Medicine

## 2018-06-11 ENCOUNTER — Ambulatory Visit: Payer: Medicare Other

## 2018-06-11 ENCOUNTER — Ambulatory Visit
Admission: RE | Admit: 2018-06-11 | Discharge: 2018-06-11 | Disposition: A | Discharge status: Home / Self Care | Payer: Medicare Other | Source: Ambulatory Visit | Attending: Internal Medicine | Admitting: Internal Medicine

## 2018-06-11 DIAGNOSIS — Z1231 Encounter for screening mammogram for malignant neoplasm of breast: Secondary | ICD-10-CM | POA: Diagnosis not present

## 2018-07-12 ENCOUNTER — Ambulatory Visit (INDEPENDENT_AMBULATORY_CARE_PROVIDER_SITE_OTHER): Payer: Medicare Other

## 2018-07-12 DIAGNOSIS — Z111 Encounter for screening for respiratory tuberculosis: Secondary | ICD-10-CM

## 2018-07-14 ENCOUNTER — Encounter: Payer: Self-pay | Admitting: *Deleted

## 2018-07-14 LAB — TB SKIN TEST
INDURATION: 0 mm
TB SKIN TEST: NEGATIVE

## 2018-07-16 DIAGNOSIS — N950 Postmenopausal bleeding: Secondary | ICD-10-CM | POA: Diagnosis not present

## 2018-07-16 DIAGNOSIS — R9389 Abnormal findings on diagnostic imaging of other specified body structures: Secondary | ICD-10-CM | POA: Diagnosis not present

## 2018-08-05 DIAGNOSIS — C220 Liver cell carcinoma: Secondary | ICD-10-CM | POA: Diagnosis not present

## 2018-08-05 DIAGNOSIS — K7469 Other cirrhosis of liver: Secondary | ICD-10-CM | POA: Diagnosis not present

## 2018-08-13 DIAGNOSIS — N950 Postmenopausal bleeding: Secondary | ICD-10-CM | POA: Diagnosis not present

## 2018-08-13 DIAGNOSIS — N8502 Endometrial intraepithelial neoplasia [EIN]: Secondary | ICD-10-CM | POA: Diagnosis not present

## 2018-08-13 DIAGNOSIS — R9389 Abnormal findings on diagnostic imaging of other specified body structures: Secondary | ICD-10-CM | POA: Diagnosis not present

## 2018-08-20 ENCOUNTER — Encounter: Payer: Self-pay | Admitting: Internal Medicine

## 2018-08-20 ENCOUNTER — Ambulatory Visit (INDEPENDENT_AMBULATORY_CARE_PROVIDER_SITE_OTHER): Payer: Medicare Other | Admitting: Internal Medicine

## 2018-08-20 DIAGNOSIS — M5440 Lumbago with sciatica, unspecified side: Secondary | ICD-10-CM | POA: Diagnosis not present

## 2018-08-20 DIAGNOSIS — G8929 Other chronic pain: Secondary | ICD-10-CM

## 2018-08-20 DIAGNOSIS — C220 Liver cell carcinoma: Secondary | ICD-10-CM

## 2018-08-20 DIAGNOSIS — I10 Essential (primary) hypertension: Secondary | ICD-10-CM

## 2018-08-20 DIAGNOSIS — N939 Abnormal uterine and vaginal bleeding, unspecified: Secondary | ICD-10-CM

## 2018-08-20 NOTE — Assessment & Plan Note (Signed)
Wt Readings from Last 3 Encounters:  08/20/18 295 lb (133.8 kg)  05/14/18 294 lb (133.4 kg)  02/12/18 292 lb (132.5 kg)

## 2018-08-20 NOTE — Assessment & Plan Note (Signed)
Loose wt 

## 2018-08-20 NOTE — Assessment & Plan Note (Addendum)
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 Has an appt to see an oncologist

## 2018-08-20 NOTE — Assessment & Plan Note (Signed)
Under regular surveillance

## 2018-08-20 NOTE — Progress Notes (Signed)
Subjective:  Patient ID: Samantha [name], female    DOB: 07-04-1951  Age: 67 y.o. MRN: [id number]  CC: No chief complaint on file.   HPI Samantha [name] presents for liver cancer. 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 Has an appt to see an oncologist C/o blood in stool once - last colon 2016  Outpatient Medications Prior to Visit  Medication Sig Dispense Refill  . aspirin EC 81 MG tablet Take 81 mg by mouth daily.    Marland Kitchen BLACK COHOSH PO Take 1 tablet by mouth daily.    . Cholecalciferol (EQL VITAMIN D3) 1000 UNITS tablet Take 1,000 Units by mouth daily.     . cyclobenzaprine (FLEXERIL) 5 MG tablet Take 1 tablet (5 mg total) by mouth 3 (three) times daily as needed for muscle spasms. 30 tablet 0  . furosemide (LASIX) 20 MG tablet Take 1-2 tablets (20-40 mg total) by mouth daily. 180 tablet 3  . ibuprofen (ADVIL,MOTRIN) 600 MG tablet Take twice a day prn pain 60 tablet 3  . loratadine (CLARITIN) 10 MG tablet Take 1 tablet (10 mg total) by mouth daily. 90 tablet 3  . losartan (COZAAR) 100 MG tablet Take 1 tablet (100 mg total) by mouth daily. 90 tablet 3  . meloxicam (MOBIC) 7.5 MG tablet TAKE 1 TO 2 TABLETS BY MOUTH ONCE DAILY AS NEEDED FOR PAIN 60 tablet 1  . Multiple Vitamin (MULTIVITAMIN WITH MINERALS) TABS tablet Take 1 tablet by mouth daily.     . ondansetron (ZOFRAN) 8 MG tablet Take 8 mg by mouth every 8 (eight) hours as needed for nausea or vomiting. Reported on 07/03/2016    . pantoprazole (PROTONIX) 40 MG tablet Take 1 tablet (40 mg total) by mouth daily. 90 tablet 3  . phentermine (ADIPEX-P) 37.5 MG tablet TAKE ONE TABLET BY MOUTH ONCE DAILY BEFORE BREAKFAST 30 tablet 2  . potassium chloride SA (K-DUR,KLOR-CON) 20 MEQ tablet TAKE ONE TABLET BY MOUTH ONCE DAILY 90 tablet 3   No facility-administered medications prior to visit.     ROS: Review of Systems  Constitutional: Negative for activity change, appetite change, chills,  fatigue and unexpected weight change.  HENT: Negative for congestion, mouth sores and sinus pressure.   Eyes: Negative for visual disturbance.  Respiratory: Negative for cough and chest tightness.   Gastrointestinal: Negative for abdominal pain and nausea.  Genitourinary: Negative for difficulty urinating, frequency and vaginal pain.  Musculoskeletal: Positive for arthralgias and gait problem. Negative for back pain.  Skin: Negative for pallor and rash.  Neurological: Negative for dizziness, tremors, weakness, numbness and headaches.  Psychiatric/Behavioral: Negative for confusion, sleep disturbance and suicidal ideas.    Objective:  BP (!) 142/88 (BP Location: Left Arm, Patient Position: Sitting, Cuff Size: Large)   Pulse 84   Temp 98.4 F (36.9 C) (Oral)   Ht 5\' 3"  (1.6 m)   Wt 295 lb (133.8 kg)   BMI 52.26 kg/m   BP Readings from Last 3 Encounters:  08/20/18 (!) 142/88  06/03/18 (!) 182/102  05/14/18 132/82    Wt Readings from Last 3 Encounters:  08/20/18 295 lb (133.8 kg)  05/14/18 294 lb (133.4 kg)  02/12/18 292 lb (132.5 kg)    Physical Exam  Constitutional: She appears well-developed. No distress.  HENT:  Head: Normocephalic.  Right Ear: External ear normal.  Left Ear: External ear normal.  Nose: Nose normal.  Mouth/Throat: Oropharynx is clear and moist.  Eyes: Pupils are equal, round, and reactive to light. Conjunctivae are normal. Right eye exhibits no discharge. Left eye exhibits no discharge.  Neck: Normal range of motion. Neck supple. No JVD present. No tracheal deviation present. No thyromegaly present.  Cardiovascular: Normal rate, regular rhythm and normal heart sounds.  Pulmonary/Chest: No stridor. No respiratory distress. She has no wheezes.  Abdominal: Soft. Bowel sounds are normal. She exhibits no distension and no mass. There is no tenderness. There is no rebound and no guarding.  Musculoskeletal: She exhibits tenderness. She exhibits no edema.    Lymphadenopathy:    She has no cervical adenopathy.  Neurological: She displays normal reflexes. No cranial nerve deficit. She exhibits normal muscle tone. Coordination normal.  Skin: No rash noted. No erythema.  Psychiatric: She has a normal mood and affect. Her behavior is normal. Judgment and thought content normal.   Obese  Lab Results  Component Value Date   WBC 4.4 05/27/2018   HGB 12.0 05/27/2018   HCT 36.8 05/27/2018   PLT 195 05/27/2018   GLUCOSE 89 05/27/2018   CHOL 154 10/20/2017   TRIG 132.0 10/20/2017   HDL 44.80 10/20/2017   LDLCALC 82 10/20/2017   ALT 9 05/27/2018   AST 14 05/27/2018   NA 144 05/27/2018   K 4.4 05/27/2018   CL 111 (H) 05/27/2018   CREATININE 0.85 05/27/2018   BUN 19 05/27/2018   CO2 25 05/27/2018   TSH 1.91 10/20/2017   INR 1.0 05/27/2018   HGBA1C 5.8 08/14/2015    Mm 3d Screen Breast Bilateral  Result Date: 06/11/2018 CLINICAL DATA:  Screening. EXAM: DIGITAL SCREENING BILATERAL MAMMOGRAM WITH TOMO AND CAD COMPARISON:  Previous exam(s). ACR Breast Density Category a: The breast tissue is almost entirely fatty. FINDINGS: There are no findings suspicious for malignancy. Images were processed with CAD. IMPRESSION: No mammographic evidence of malignancy. A result letter of this screening mammogram will be mailed directly to the patient. RECOMMENDATION: Screening mammogram in one year. (Code:SM-B-01Y) BI-RADS CATEGORY  1: Negative. Electronically Signed   By: Lajean Manes M.D.   On: 06/11/2018 16:50    Assessment & Plan:   There are no diagnoses linked to this encounter.   No orders of the defined types were placed in this encounter.    Follow-up: No follow-ups on file.  Walker Kehr, MD

## 2018-08-20 NOTE — Assessment & Plan Note (Signed)
Losartan, Norvasc, Lasix 

## 2018-08-24 ENCOUNTER — Inpatient Hospital Stay: Payer: Medicare Other | Attending: Gynecology | Admitting: Gynecology

## 2018-08-24 ENCOUNTER — Encounter: Payer: Self-pay | Admitting: Gynecology

## 2018-08-24 VITALS — BP 149/86 | HR 84 | Temp 98.7°F | Resp 20 | Ht 61.0 in | Wt 294.2 lb

## 2018-08-24 DIAGNOSIS — Z87891 Personal history of nicotine dependence: Secondary | ICD-10-CM | POA: Diagnosis not present

## 2018-08-24 DIAGNOSIS — N8502 Endometrial intraepithelial neoplasia [EIN]: Secondary | ICD-10-CM | POA: Diagnosis not present

## 2018-08-24 NOTE — Patient Instructions (Signed)
Plan to have a dilation and curettage of the uterus with Dr. Pamala Hurry.  Once the results are back, we will formalize a plan with recommendations moving forward.  Please call Dr. Jerrilyn Cairo office tomorrow to arrange for an appointment.  Please call our office for any questions or concerns.   Dilation and Curettage or Vacuum Curettage  Dilation and curettage (D&C) and vacuum curettage are minor procedures. A D&C involves stretching (dilation) the cervix and scraping (curettage) the inside lining of the uterus (endometrium). During a D&C, tissue is gently scraped from the endometrium, starting from the top portion of the uterus down to the lowest part of the uterus (cervix). During a vacuum curettage, the lining and tissue in the uterus are removed with the use of gentle suction. Curettage may be performed to either diagnose or treat a problem. As a diagnostic procedure, curettage is performed to examine tissues from the uterus. A diagnostic curettage may be done if you have:  Irregular bleeding in the uterus.  Bleeding with the development of clots.  Spotting between menstrual periods.  Prolonged menstrual periods or other abnormal bleeding.  Bleeding after menopause.  No menstrual period (amenorrhea).  A change in size and shape of the uterus.  Abnormal endometrial cells discovered during a Pap test.  As a treatment procedure, curettage may be performed for the following reasons:  Removal of an IUD (intrauterine device).  Removal of retained placenta after giving birth.  Abortion.  Miscarriage.  Removal of endometrial polyps.  Removal of uncommon types of noncancerous lumps (fibroids).  Tell a health care provider about:  Any allergies you have, including allergies to prescribed medicine or latex.  All medicines you are taking, including vitamins, herbs, eye drops, creams, and over-the-counter medicines. This is especially important if you take any blood-thinning medicine.  Bring a list of all of your medicines to your appointment.  Any problems you or family members have had with anesthetic medicines.  Any blood disorders you have.  Any surgeries you have had.  Your medical history and any medical conditions you have.  Whether you are pregnant or may be pregnant.  Recent vaginal infections you have had.  Recent menstrual periods, bleeding problems you have had, and what form of birth control (contraception) you use. What are the risks? Generally, this is a safe procedure. However, problems may occur, including:  Infection.  Heavy vaginal bleeding.  Allergic reactions to medicines.  Damage to the cervix or other structures or organs.  Development of scar tissue (adhesions) inside the uterus, which can cause abnormal amounts of menstrual bleeding. This may make it harder to get pregnant in the future.  A hole (perforation) or puncture in the uterine wall. This is rare.  What happens before the procedure? Staying hydrated Follow instructions from your health care provider about hydration, which may include:  Up to 2 hours before the procedure - you may continue to drink clear liquids, such as water, clear fruit juice, black coffee, and plain tea.  Eating and drinking restrictions Follow instructions from your health care provider about eating and drinking, which may include:  8 hours before the procedure - stop eating heavy meals or foods such as meat, fried foods, or fatty foods.  6 hours before the procedure - stop eating light meals or foods, such as toast or cereal.  6 hours before the procedure - stop drinking milk or drinks that contain milk.  2 hours before the procedure - stop drinking clear liquids. If your health  care provider told you to take your medicine(s) on the day of your procedure, take them with only a sip of water.  Medicines  Ask your health care provider about: ? Changing or stopping your regular medicines. This is  especially important if you are taking diabetes medicines or blood thinners. ? Taking medicines such as aspirin and ibuprofen. These medicines can thin your blood. Do not take these medicines before your procedure if your health care provider instructs you not to.  You may be given antibiotic medicine to help prevent infection. General instructions  For 24 hours before your procedure, do not: ? Douche. ? Use tampons. ? Use medicines, creams, or suppositories in the vagina. ? Have sexual intercourse.  You may be given a pregnancy test on the day of the procedure.  Plan to have someone take you home from the hospital or clinic.  You may have a blood or urine sample taken.  If you will be going home right after the procedure, plan to have someone with you for 24 hours. What happens during the procedure?  To reduce your risk of infection: ? Your health care team will wash or sanitize their hands. ? Your skin will be washed with soap.  An IV tube will be inserted into one of your veins.  You will be given one of the following: ? A medicine that numbs the area in and around the cervix (local anesthetic). ? A medicine to make you fall asleep (general anesthetic).  You will lie down on your back, with your feet in foot rests (stirrups).  The size and position of your uterus will be checked.  A lubricated instrument (speculum or Sims retractor) will be inserted into the back side of your vagina. The speculum will be used to hold apart the walls of your vagina so your health care provider can see your cervix.  A tool (tenaculum) will be attached to the lip of the cervix to stabilize it.  Your cervix will be softened and dilated. This may be done by: ? Taking a medicine. ? Having tapered dilators or thin rods (laminaria) or gradual widening instruments (tapered dilators) inserted into your cervix.  A small, sharp, curved instrument (curette) will be used to scrape a small amount of  tissue or cells from the endometrium or cervical canal. In some cases, gentle suction is applied with the curette. The curette will then be removed. The cells will be taken to a lab for testing. The procedure may vary among health care providers and hospitals. What happens after the procedure?  You may have mild cramping, backache, pain, and light bleeding or spotting. You may pass small blood clots from your vagina.  You may have to wear compression stockings. These stockings help to prevent blood clots and reduce swelling in your legs.  Your blood pressure, heart rate, breathing rate, and blood oxygen level will be monitored until the medicines you were given have worn off. Summary  Dilation and curettage (D&C) involves stretching (dilation) the cervix and scraping (curettage) the inside lining of the uterus (endometrium).  After the procedure, you may have mild cramping, backache, pain, and light bleeding or spotting. You may pass small blood clots from your vagina.  Plan to have someone take you home from the hospital or clinic. This information is not intended to replace advice given to you by your health care provider. Make sure you discuss any questions you have with your health care provider. Document Released: 12/15/2005 Document Revised:  08/31/2016 Document Reviewed: 08/31/2016 Elsevier Interactive Patient Education  [employer].

## 2018-08-24 NOTE — Progress Notes (Signed)
Consult Note: Gyn-Onc   Samantha [name] 67 y.o. female  Chief Complaint  Patient presents with  . 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  Plan: I would recommend the patient undergo a formal dilatation and curettage to obtain more tissue as there may be an associated endometrial carcinoma present.  Pending that pathology report further management would be decided which might include hysterectomy or use of progestins.  The patient will contact Dr. Earl Many office to arrange to have a D&C performed.  HPI: 67 year old Afro-American female seen in consultation at the request of Dr. Valentino Saxon regarding management of a endometrial biopsy showing EIN.  The patient had 1 day of spotting and presented to Dr. Valentino Saxon.  Ultrasound of the uterus showed a 9.9 mm endometrial stripe and multiple small fibroids.  Patient has no other gynecologic history.  She had 4 vaginal deliveries.  Review of Systems:10 point review of systems is negative except as noted in interval history.   Vitals: Blood pressure (!) 149/86, pulse 84, temperature 98.7 F (37.1 C), temperature source Oral, resp. rate 20, height 5\' 1"  (1.549 m), weight 294 lb 3.2 oz (133.4 kg), SpO2 99 %.  Physical Exam: General : The patient is a healthy woman in no acute distress.  HEENT: normocephalic, extraoccular movements normal; neck is supple without thyromegally  Lynphnodes: Supraclavicular and inguinal nodes not enlarged  Abdomen: Soft, non-tender, no ascites, no organomegally, no masses, no hernias  Pelvic:  EGBUS: Normal female  Vagina: Normal, no lesions  Urethra and Bladder: Normal, non-tender  Cervix: Normal no lesions Uterus: Difficult to palpate secondary to the patient's habitus. Bi-manual examination: Non-tender; no adenxal masses or nodularity    Lower extremities: No edema or varicosities. Normal range of motion       Allergies  Allergen Reactions  . Amlodipine Swelling    Edema whole body, especially in feet  . Benazepril Hcl Cough  . Codeine Itching    Past Medical History:  Diagnosis Date  . Allergy    seasonal  . Cancer ([place]) 2017   liver cancer  . Elevated glucose    no longer an issue 03-18-16  . GERD (gastroesophageal reflux disease)   . H/O seasonal allergies   . Hepatitis C    hepatitis c completed march 2017  . Hyperlipidemia 2011  . Hypertension   . LBP (low back pain)   . Obesity   . Osteoarthritis    B knees    Past Surgical History:  Procedure Laterality Date  . CHOLECYSTECTOMY    . COLONOSCOPY N/A 01/09/2015   Procedure: COLONOSCOPY;  Surgeon: Jerene Bears, MD;  Location: [facility];  Service: Gastroenterology;  Laterality: N/A;  . ESOPHAGOGASTRODUODENOSCOPY (EGD) WITH PROPOFOL N/A 06/10/2016   Procedure: ESOPHAGOGASTRODUODENOSCOPY (EGD) WITH PROPOFOL;  Surgeon: Jerene Bears, MD;  Location: [facility];  Service: Gastroenterology;  Laterality: N/A;  . IR GENERIC HISTORICAL  07/03/2016   IR RADIOLOGIST EVAL & MGMT 07/03/2016 [name], MD [facility] INTERV RAD  . IR GENERIC HISTORICAL  11/18/2016   IR RADIOLOGIST EVAL & MGMT 11/18/2016 [name], MD [facility] INTERV RAD  . IR RADIOLOGIST EVAL & MGMT  04/09/2017  . IR RADIOLOGIST EVAL & MGMT  11/05/2017  . JOINT REPLACEMENT    . left shoulder arthroplasty    . right knee replacement  2007  . total knee replacement L  03-13-08  . TUBAL LIGATION      Current Outpatient  Medications  Medication Sig Dispense Refill  . aspirin EC 81 MG tablet Take 81 mg by mouth daily.    Marland Kitchen BLACK COHOSH PO Take 1 tablet by mouth daily.    . Cholecalciferol (EQL VITAMIN D3) 1000 UNITS tablet Take 1,000 Units by mouth daily.     . cyclobenzaprine (FLEXERIL) 5 MG tablet Take 1 tablet (5 mg total) by mouth 3 (three) times daily as needed for muscle spasms. 30 tablet 0  . furosemide (LASIX) 20 MG tablet Take 1-2 tablets (20-40 mg  total) by mouth daily. 180 tablet 3  . ibuprofen (ADVIL,MOTRIN) 600 MG tablet Take twice a day prn pain 60 tablet 3  . loratadine (CLARITIN) 10 MG tablet Take 1 tablet (10 mg total) by mouth daily. 90 tablet 3  . losartan (COZAAR) 100 MG tablet Take 1 tablet (100 mg total) by mouth daily. 90 tablet 3  . meloxicam (MOBIC) 7.5 MG tablet TAKE 1 TO 2 TABLETS BY MOUTH ONCE DAILY AS NEEDED FOR PAIN 60 tablet 1  . Multiple Vitamin (MULTIVITAMIN WITH MINERALS) TABS tablet Take 1 tablet by mouth daily.     . ondansetron (ZOFRAN) 8 MG tablet Take 8 mg by mouth every 8 (eight) hours as needed for nausea or vomiting. Reported on 07/03/2016    . pantoprazole (PROTONIX) 40 MG tablet Take 1 tablet (40 mg total) by mouth daily. 90 tablet 3  . phentermine (ADIPEX-P) 37.5 MG tablet TAKE ONE TABLET BY MOUTH ONCE DAILY BEFORE BREAKFAST 30 tablet 2  . potassium chloride SA (K-DUR,KLOR-CON) 20 MEQ tablet TAKE ONE TABLET BY MOUTH ONCE DAILY 90 tablet 3   No current facility-administered medications for this visit.     Social History   Socioeconomic History  . Marital status: Single    Spouse name: Not on file  . Number of children: 4  . Years of education: Not on file  . Highest education level: Not on file  Occupational History  . Not on file  Social Needs  . Financial resource strain: Not hard at all  . Food insecurity:    Worry: Never true    Inability: Never true  . Transportation needs:    Medical: No    Non-medical: No  Tobacco Use  . Smoking status: Former Smoker    Packs/day: 0.25    Years: 20.00    Pack years: 5.00    Last attempt to quit: 12/30/2007    Years since quitting: 10.6  . Smokeless tobacco: Never Used  Substance and Sexual Activity  . 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 Drug use: No  . Sexual activity: Not Currently  Lifestyle  . Physical activity:    Days per week: 3 days    Minutes per session:  30 min  . Stress: Not at all  Relationships  . Social connections:    Talks on phone: More than three times a week    Gets together: More than three times a week    Attends religious service: More than 4 times per year    Active member of club or organization: Yes    Attends meetings of clubs or organizations: More than 4 times per year    Relationship status: Not on file  . Intimate partner violence:    Fear of current or ex partner: No    Emotionally abused: No    Physically abused: No  Forced sexual activity: No  Other Topics Concern  . Not on file  Social History Narrative   Regular exercise: walk a few times a week   Caffeine use: none   Single, raising her young great granddaughter and watches grandchildren after school   Has a room mate to help prn   Retired from nursing tech--worked at [employer]   Independent/drives    Family History  Problem Relation Age of Onset  . Hypertension Mother   . Ovarian cancer Mother   . Hypertension Father   . Cancer Father 76       prostate cancer   . Diabetes Father   . Diabetes Other   . Hypertension Other   . Colon cancer Neg Hx   . Esophageal cancer Neg Hx   . Rectal cancer Neg Hx   . Stomach cancer Neg Hx   . Breast cancer Neg Hx       Marti Sleigh, MD 08/24/2018, 12:43 PM

## 2018-08-26 ENCOUNTER — Telehealth: Payer: Self-pay

## 2018-08-26 NOTE — Telephone Encounter (Signed)
Dr. Pamala Hurry left a message stating that she received the message from Dr. Fermin Schwab to obtain more tissue for diagnosis with a D&C and hysteroscopy. She is in the process of setting that up for the patient.  She will re consult once pathology is known.

## 2018-09-15 ENCOUNTER — Telehealth: Payer: Self-pay | Admitting: Internal Medicine

## 2018-09-15 NOTE — Telephone Encounter (Signed)
Form received and given to Dr. Alain Marion

## 2018-09-15 NOTE — Telephone Encounter (Signed)
Copied from [place] [id number]. Topic: Inquiry >> Sep 15, 2018  8:29 AM Margot Ables wrote: Reason for CRM: Surgical Clearance form was faxed over Friday 09/10/18. Pt is scheduled for surgery 10/15/18 for [facility] hysteroscopy. Please advise if received.

## 2018-09-17 ENCOUNTER — Ambulatory Visit: Payer: Medicare Other

## 2018-09-21 ENCOUNTER — Other Ambulatory Visit: Payer: Self-pay | Admitting: Obstetrics

## 2018-09-23 ENCOUNTER — Ambulatory Visit (INDEPENDENT_AMBULATORY_CARE_PROVIDER_SITE_OTHER): Payer: Medicare Other

## 2018-09-23 DIAGNOSIS — Z23 Encounter for immunization: Secondary | ICD-10-CM

## 2018-10-01 NOTE — Patient Instructions (Addendum)
Your procedure is scheduled on: Friday October 15, 2018 at 7:30 am  Enter through the Main Entrance of [facility] at: 6:00 am  Pick up the phone at the desk and dial [phone].  Call this number if you have problems the morning of surgery: [phone].  Remember: Do NOT eat food or drink any liquids after: Midnight on Thursday October 17  Take these medicines the morning of surgery with a SIP OF WATER: CLARITIN, PROTONIX. MOBIC IF NEEDED   HOLD ASPIRIN FOR 1 WEEK PRIOR TO SURGERY  HOLD LASIX DAY OF SURGERY  STOP ALL VITAMINS, SUPPLEMENTS, HERBAL MEDICATIONS NOW  DO NOT SMOKE DAY OF SURGERY  BRUSH YOUR TEETH DAY OF SURGERY  Do NOT wear jewelry (body piercing), metal hair clips/bobby pins, make-up, or nail polish. Do NOT wear lotions, powders, or perfumes.  You may wear deoderant. Do NOT shave for 48 hours prior to surgery. Do NOT bring valuables to the hospital. Contacts, dentures, or bridgework may not be worn into surgery.  Have a responsible adult drive you home and stay with you for 24 hours after your procedure

## 2018-10-04 ENCOUNTER — Other Ambulatory Visit: Payer: Self-pay

## 2018-10-04 ENCOUNTER — Encounter (HOSPITAL_COMMUNITY)
Admission: RE | Admit: 2018-10-04 | Discharge: 2018-10-04 | Disposition: A | Discharge status: Home / Self Care | Payer: Medicare Other | Source: Ambulatory Visit | Attending: Obstetrics | Admitting: Obstetrics

## 2018-10-04 ENCOUNTER — Encounter (HOSPITAL_COMMUNITY): Payer: Self-pay

## 2018-10-04 DIAGNOSIS — Z01812 Encounter for preprocedural laboratory examination: Secondary | ICD-10-CM | POA: Diagnosis not present

## 2018-10-04 DIAGNOSIS — Z01810 Encounter for preprocedural cardiovascular examination: Secondary | ICD-10-CM | POA: Diagnosis not present

## 2018-10-04 DIAGNOSIS — R9431 Abnormal electrocardiogram [ECG] [EKG]: Secondary | ICD-10-CM | POA: Diagnosis not present

## 2018-10-04 LAB — BASIC METABOLIC PANEL
Anion gap: 6 (ref 5–15)
BUN: 16 mg/dL (ref 8–23)
CHLORIDE: 109 mmol/L (ref 98–111)
CO2: 26 mmol/L (ref 22–32)
CREATININE: 0.81 mg/dL (ref 0.44–1.00)
Calcium: 9.1 mg/dL (ref 8.9–10.3)
GFR calc Af Amer: >60 mL/min (ref >60)
GFR calc non Af Amer: >60 mL/min (ref >60)
Glucose, Bld: 89 mg/dL (ref 70–99)
Potassium: 4.3 mmol/L (ref 3.5–5.1)
Sodium: 141 mmol/L (ref 135–145)

## 2018-10-04 LAB — ABO/RH: ABO/RH(D): O POS

## 2018-10-04 LAB — CBC
HCT: 38.5 % (ref 36.0–46.0)
Hemoglobin: 11.9 g/dL — ABNORMAL LOW (ref 12.0–15.0)
MCH: 26.3 pg (ref 26.0–34.0)
MCHC: 30.9 g/dL (ref 30.0–36.0)
MCV: 85 fL (ref 78.0–100.0)
Platelets: 185 10*3/uL (ref 150–400)
RBC: 4.53 MIL/uL (ref 3.87–5.11)
RDW: 14.5 % (ref 11.5–15.5)
WBC: 3.6 10*3/uL — ABNORMAL LOW (ref 4.0–10.5)

## 2018-10-04 LAB — TYPE AND SCREEN
ABO/RH(D): O POS
ANTIBODY SCREEN: NEGATIVE

## 2018-10-04 NOTE — Pre-Procedure Instructions (Signed)
Dr. Thomes Cake viewed EKG aware of history

## 2018-10-05 DIAGNOSIS — Z01812 Encounter for preprocedural laboratory examination: Secondary | ICD-10-CM | POA: Diagnosis not present

## 2018-10-05 DIAGNOSIS — Z01810 Encounter for preprocedural cardiovascular examination: Secondary | ICD-10-CM | POA: Diagnosis not present

## 2018-10-05 DIAGNOSIS — R9431 Abnormal electrocardiogram [ECG] [EKG]: Secondary | ICD-10-CM | POA: Diagnosis not present

## 2018-10-06 ENCOUNTER — Inpatient Hospital Stay (HOSPITAL_COMMUNITY): Admission: RE | Admit: 2018-10-06 | Payer: Medicare Other | Source: Ambulatory Visit

## 2018-10-15 ENCOUNTER — Encounter (HOSPITAL_COMMUNITY)
Admission: RE | Disposition: A | Discharge status: Home / Self Care | Payer: Self-pay | Source: Ambulatory Visit | Attending: Obstetrics

## 2018-10-15 ENCOUNTER — Encounter (HOSPITAL_COMMUNITY): Payer: Self-pay

## 2018-10-15 ENCOUNTER — Other Ambulatory Visit: Payer: Self-pay

## 2018-10-15 ENCOUNTER — Ambulatory Visit (HOSPITAL_COMMUNITY): Payer: Medicare Other | Admitting: Anesthesiology

## 2018-10-15 ENCOUNTER — Ambulatory Visit (HOSPITAL_COMMUNITY)
Admission: RE | Admit: 2018-10-15 | Discharge: 2018-10-15 | Disposition: A | Discharge status: Home / Self Care | Payer: Medicare Other | Source: Ambulatory Visit | Attending: Obstetrics | Admitting: Obstetrics

## 2018-10-15 DIAGNOSIS — I10 Essential (primary) hypertension: Secondary | ICD-10-CM | POA: Insufficient documentation

## 2018-10-15 DIAGNOSIS — Z780 Asymptomatic menopausal state: Secondary | ICD-10-CM | POA: Diagnosis not present

## 2018-10-15 DIAGNOSIS — N950 Postmenopausal bleeding: Secondary | ICD-10-CM | POA: Insufficient documentation

## 2018-10-15 DIAGNOSIS — Z87891 Personal history of nicotine dependence: Secondary | ICD-10-CM | POA: Diagnosis not present

## 2018-10-15 DIAGNOSIS — Z6841 Body Mass Index (BMI) 40.0 and over, adult: Secondary | ICD-10-CM | POA: Insufficient documentation

## 2018-10-15 DIAGNOSIS — Z96612 Presence of left artificial shoulder joint: Secondary | ICD-10-CM | POA: Insufficient documentation

## 2018-10-15 DIAGNOSIS — K219 Gastro-esophageal reflux disease without esophagitis: Secondary | ICD-10-CM | POA: Insufficient documentation

## 2018-10-15 DIAGNOSIS — Z885 Allergy status to narcotic agent status: Secondary | ICD-10-CM | POA: Insufficient documentation

## 2018-10-15 DIAGNOSIS — N840 Polyp of corpus uteri: Secondary | ICD-10-CM | POA: Insufficient documentation

## 2018-10-15 DIAGNOSIS — Z8619 Personal history of other infectious and parasitic diseases: Secondary | ICD-10-CM | POA: Insufficient documentation

## 2018-10-15 DIAGNOSIS — Z8505 Personal history of malignant neoplasm of liver: Secondary | ICD-10-CM | POA: Diagnosis not present

## 2018-10-15 DIAGNOSIS — Z96653 Presence of artificial knee joint, bilateral: Secondary | ICD-10-CM | POA: Insufficient documentation

## 2018-10-15 DIAGNOSIS — Z888 Allergy status to other drugs, medicaments and biological substances status: Secondary | ICD-10-CM | POA: Diagnosis not present

## 2018-10-15 DIAGNOSIS — N939 Abnormal uterine and vaginal bleeding, unspecified: Secondary | ICD-10-CM

## 2018-10-15 DIAGNOSIS — N8502 Endometrial intraepithelial neoplasia [EIN]: Secondary | ICD-10-CM | POA: Diagnosis present

## 2018-10-15 HISTORY — PX: HYSTEROSCOPY WITH D & C: SHX1775

## 2018-10-15 SURGERY — DILATATION AND CURETTAGE /HYSTEROSCOPY
Anesthesia: General

## 2018-10-15 MED ORDER — ONDANSETRON HCL 4 MG/2ML IJ SOLN
INTRAMUSCULAR | Status: AC
  Filled 2018-10-15: qty 2

## 2018-10-15 MED ORDER — BUPIVACAINE HCL (PF) 0.25 % IJ SOLN
INTRAMUSCULAR | Status: AC
  Filled 2018-10-15: qty 30

## 2018-10-15 MED ORDER — ONDANSETRON HCL 4 MG/2ML IJ SOLN
INTRAMUSCULAR | Status: DC | PRN
  Administered 2018-10-15: 4 mg via INTRAVENOUS

## 2018-10-15 MED ORDER — SODIUM CHLORIDE 0.9 % IJ SOLN
INTRAMUSCULAR | Status: AC
  Filled 2018-10-15: qty 100

## 2018-10-15 MED ORDER — DEXAMETHASONE SODIUM PHOSPHATE 4 MG/ML IJ SOLN
INTRAMUSCULAR | Status: AC
  Filled 2018-10-15: qty 1

## 2018-10-15 MED ORDER — LACTATED RINGERS IV SOLN
INTRAVENOUS | Status: DC
  Administered 2018-10-15: 125 mL/h via INTRAVENOUS

## 2018-10-15 MED ORDER — MIDAZOLAM HCL 2 MG/2ML IJ SOLN
INTRAMUSCULAR | Status: AC
  Filled 2018-10-15: qty 2

## 2018-10-15 MED ORDER — SILVER NITRATE-POT NITRATE 75-25 % EX MISC
CUTANEOUS | Status: AC
  Filled 2018-10-15: qty 1

## 2018-10-15 MED ORDER — LIDOCAINE HCL (CARDIAC) PF 100 MG/5ML IV SOSY
PREFILLED_SYRINGE | INTRAVENOUS | Status: AC
  Filled 2018-10-15: qty 5

## 2018-10-15 MED ORDER — PROPOFOL 10 MG/ML IV BOLUS
INTRAVENOUS | Status: DC | PRN
  Administered 2018-10-15: 180 mg via INTRAVENOUS

## 2018-10-15 MED ORDER — PROPOFOL 10 MG/ML IV BOLUS
INTRAVENOUS | Status: AC
  Filled 2018-10-15: qty 20

## 2018-10-15 MED ORDER — EPHEDRINE 5 MG/ML INJ
INTRAVENOUS | Status: AC
  Filled 2018-10-15: qty 10

## 2018-10-15 MED ORDER — FENTANYL CITRATE (PF) 100 MCG/2ML IJ SOLN: 25.0000 ug | INTRAMUSCULAR | Status: DC | PRN

## 2018-10-15 MED ORDER — PHENYLEPHRINE 40 MCG/ML (10ML) SYRINGE FOR IV PUSH (FOR BLOOD PRESSURE SUPPORT)
PREFILLED_SYRINGE | INTRAVENOUS | Status: AC
  Filled 2018-10-15: qty 10

## 2018-10-15 MED ORDER — ONDANSETRON HCL 4 MG/2ML IJ SOLN: 4.0000 mg | Freq: Once | INTRAMUSCULAR | Status: DC | PRN

## 2018-10-15 MED ORDER — LIDOCAINE HCL (CARDIAC) PF 100 MG/5ML IV SOSY
PREFILLED_SYRINGE | INTRAVENOUS | Status: DC | PRN
  Administered 2018-10-15: 100 mg via INTRAVENOUS

## 2018-10-15 MED ORDER — MIDAZOLAM HCL 2 MG/2ML IJ SOLN
INTRAMUSCULAR | Status: DC | PRN
  Administered 2018-10-15: 1 mg via INTRAVENOUS

## 2018-10-15 MED ORDER — OXYCODONE-ACETAMINOPHEN 5-325 MG PO TABS: 1.0000 | ORAL_TABLET | ORAL | 0 refills | Status: DC | PRN

## 2018-10-15 MED ORDER — VASOPRESSIN 20 UNIT/ML IV SOLN
INTRAVENOUS | Status: AC
  Filled 2018-10-15: qty 1

## 2018-10-15 MED ORDER — CHLOROPROCAINE HCL 1 % IJ SOLN
INTRAMUSCULAR | Status: AC
  Filled 2018-10-15: qty 30

## 2018-10-15 MED ORDER — PHENYLEPHRINE HCL 10 MG/ML IJ SOLN
INTRAMUSCULAR | Status: DC | PRN
  Administered 2018-10-15 (×3): 40 ug via INTRAVENOUS

## 2018-10-15 MED ORDER — FENTANYL CITRATE (PF) 100 MCG/2ML IJ SOLN
INTRAMUSCULAR | Status: AC
  Filled 2018-10-15: qty 2

## 2018-10-15 SURGICAL SUPPLY — 16 items
BIPOLAR CUTTING LOOP 21FR (ELECTRODE)
CANISTER SUCT 3000ML PPV (MISCELLANEOUS) ×3 IMPLANT
CATH ROBINSON RED A/P 16FR (CATHETERS) ×3 IMPLANT
ELECT REM PT RETURN 9FT ADLT (ELECTROSURGICAL)
ELECTRODE REM PT RTRN 9FT ADLT (ELECTROSURGICAL) IMPLANT
GLOVE BIO SURGEON STRL SZ 6.5 (GLOVE) ×2 IMPLANT
GLOVE BIO SURGEONS STRL SZ 6.5 (GLOVE) ×1
GLOVE BIOGEL PI IND STRL 7.0 (GLOVE) ×2 IMPLANT
GLOVE BIOGEL PI INDICATOR 7.0 (GLOVE) ×4
GOWN STRL REUS W/TWL LRG LVL3 (GOWN DISPOSABLE) ×6 IMPLANT
LOOP CUTTING BIPOLAR 21FR (ELECTRODE) IMPLANT
PACK VAGINAL MINOR WOMEN LF (CUSTOM PROCEDURE TRAY) ×3 IMPLANT
PAD OB MATERNITY 4.3X12.25 (PERSONAL CARE ITEMS) ×3 IMPLANT
TOWEL OR 17X24 6PK STRL BLUE (TOWEL DISPOSABLE) ×6 IMPLANT
TUBING AQUILEX INFLOW (TUBING) ×3 IMPLANT
TUBING AQUILEX OUTFLOW (TUBING) ×3 IMPLANT

## 2018-10-15 NOTE — Anesthesia Procedure Notes (Signed)
Procedure Name: LMA Insertion Date/Time: 10/15/2018 7:35 AM Performed by: Hewitt Blade, CRNA Pre-anesthesia Checklist: Patient identified, Emergency Drugs available, Suction available and Patient being monitored Patient Re-evaluated:Patient Re-evaluated prior to induction Oxygen Delivery Method: Circle system utilized Preoxygenation: Pre-oxygenation with 100% oxygen Induction Type: IV induction Ventilation: Mask ventilation without difficulty LMA: LMA inserted LMA Size: 4.0 Number of attempts: 1 Placement Confirmation: positive ETCO2 and breath sounds checked- equal and bilateral Tube secured with: Tape Dental Injury: Teeth and Oropharynx as per pre-operative assessment

## 2018-10-15 NOTE — Transfer of Care (Signed)
Immediate Anesthesia Transfer of Care Note  Patient: Samantha [name]  Procedure(s) Performed: DILATATION AND CURETTAGE /HYSTEROSCOPY (N/A )  Patient Location: PACU  Anesthesia Type:General  Level of Consciousness: awake, alert  and oriented  Airway & Oxygen Therapy: Patient Spontanous Breathing and Patient connected to nasal cannula oxygen  Post-op Assessment: Report given to RN, Post -op Vital signs reviewed and stable and Patient moving all extremities  Post vital signs: Reviewed and stable  Last Vitals:  Vitals Value Taken Time  BP    Temp    Pulse    Resp    SpO2      Last Pain:  Vitals:   10/15/18 0636  TempSrc: Oral  PainSc: 4       Patients Stated Pain Goal: 3 (36/68/15 9470)  Complications: No apparent anesthesia complications

## 2018-10-15 NOTE — Op Note (Signed)
10/15/2018  8:34 AM  PATIENT:  Samantha [name]  67 y.o. female  PRE-OPERATIVE DIAGNOSIS:  Endometrial Intraepithelial Neoplasia  POST-OPERATIVE DIAGNOSIS:  Endometrial Intraepithelial Neoplasia  PROCEDURE:  Procedure(s): DILATATION AND CURETTAGE /HYSTEROSCOPY (N/A)  Operative hysteroscopy with directed biopsy  SURGEON:  Surgeon(s) and Role:    * Aloha Gell, MD - Primary  PHYSICIAN ASSISTANT: none  ASSISTANTS: none   ANESTHESIA:   general  EBL:  10 mL   BLOOD ADMINISTERED:none  DRAINS: none   LOCAL MEDICATIONS USED:  NONE  SPECIMEN:  Source of Specimen:  Endometrial curettings  DISPOSITION OF SPECIMEN:  PATHOLOGY  COUNTS:  YES  TOURNIQUET:  * No tourniquets in log *  DICTATION: .Note written in EPIC  PLAN OF CARE: Discharge to home after PACU  PATIENT DISPOSITION:  PACU - hemodynamically stable.   Delay start of Pharmacological VTE agent (>24hrs) due to surgical blood loss or risk of bleeding: yes  Findings: Both ostia were seen.  Endometrium appeared smooth with with the exception of a 3 mm nodule on the posterior wall.  Minimal vascularity was associated with this and it had the consistency of a fibroid.    Indications: This is a 67 year old menopausal G4, P4 with postmenopausal vaginal bleeding found to have 9 mm endometrial stripe on ultrasound and endometrial biopsy with small focus of endometrial intraepithelial neoplasia.   After informed consent including discussion of risks of bleeding, infection, damage to surrounding organs and complications from anesthesia the patient was taken to the operating room where general anesthesia was initiated without difficulty. She was prepped and draped in normal sterile fashion in the dorsal supine lithotomy position. A straight catheterization was done for 20 cc of clear urine   A bimanual examination was done to assess the size and position of the uterus. A speculum was placed in the vagina, a single-tooth  tenaculum was used to grasp the anterior lip of the cervix and the cervix was serially dilated to 21 Pratt .  A  small hysteroscope was inserted into the uterus.  Photos were taken.  Findings as above.  Suction irrigation was carried out.  Both ostia were seen.  Endometrium appeared smooth with with the exception of a 3 mm nodule on the posterior wall.  Minimal vascularity was associated with this and it had the consistency of a fibroid.  Given the preop pathology decision was made for a directed biopsy of the site.  The hysteroscopic scissors were passed through the scope and the base of this lesion was transected.  Of note minimal vascularity was noted.  The hysteroscopic grasping forceps were then placed through the operating channel and used to grasp and remove a small piece of this tissue.  Given the inability to fully grasp this lesion the hysteroscope was removed and polyp forceps were placed into the uterus and directed toward the lesion.  Tissue was removed.  Sharp curettage was then carried out throughout the entire uterine wall.  Adequate specimen was obtained.  Repeat hysteroscopy showed no active bleeding.  No evidence of perforation.  The fibroid on the posterior wall was still partially present.  The endometrium now showed effect of the curetting.    The hysteroscope was then removed. Tenaculum was removed. The tenaculum site was hemostatic and the case was terminated.  Sponge needle lap and needle counts were correct x3 patient was awoken and taken to the recovery room in a stable condition  Ala Dach 10/15/2018 8:43 AM

## 2018-10-15 NOTE — H&P (Signed)
CC: endometrial intraepithelial neoplasia  HPI: 59 you menopausal G4P4 with vaginal spotting in 5/19, ebx in 8/19 with EIN, s/p gyn onc consult who recommended further sampling. No continued bleeding.  Past Medical History:  Diagnosis Date  . Allergy    seasonal  . Cancer ([place]) 2017   liver cancer IN REMISSION. WHEN IDAGNONED SIZE OF GRAPEFRUIT   . Elevated glucose    no longer an issue 03-18-16  . GERD (gastroesophageal reflux disease)   . H/O seasonal allergies   . Hepatitis C    hepatitis c completed march 2017  . Hyperlipidemia 2011  . Hypertension   . LBP (low back pain)   . Obesity   . Osteoarthritis    B knees    Past Surgical History:  Procedure Laterality Date  . CHOLECYSTECTOMY    . COLONOSCOPY N/A 01/09/2015   Procedure: COLONOSCOPY;  Surgeon: Jerene Bears, MD;  Location: [facility];  Service: Gastroenterology;  Laterality: N/A;  . ESOPHAGOGASTRODUODENOSCOPY (EGD) WITH PROPOFOL N/A 06/10/2016   Procedure: ESOPHAGOGASTRODUODENOSCOPY (EGD) WITH PROPOFOL;  Surgeon: Jerene Bears, MD;  Location: [facility];  Service: Gastroenterology;  Laterality: N/A;  . IR GENERIC HISTORICAL  07/03/2016   IR RADIOLOGIST EVAL & MGMT 07/03/2016 [name], MD [facility]  . IR GENERIC HISTORICAL  11/18/2016   IR RADIOLOGIST EVAL & MGMT 11/18/2016 [name], MD [facility] INTER[facility] RAD  . IR RADIOLOGIST EVAL & MGMT  04/09/2017  . IR RADIOLOGIST EVAL & MGMT  11/05/2017  . JOINT REPLACEMENT     BILATERAL KNEES  . left shoulder arthroplasty    . right knee replacement  2007  . total knee replacement L  03-13-08  . TUBAL LIGATION      FH: mom with ovarian CA  All: codeine  PE: Vitals:   10/15/18 0636  BP: (!) 156/97  Pulse: 89  Resp: 16  Temp: 97.7 F (36.5 C)  TempSrc: Oral   Gen: pleasant, obese, no distress Abd: soft, obese, NT GU: def to OR  CBC    Component Value Date/Time   WBC 3.6 (L) 10/04/2018 1130   RBC 4.53 10/04/2018 1130   HGB 11.9 (L) 10/04/2018  1130   HCT 38.5 10/04/2018 1130   PLT 185 10/04/2018 1130   MCV 85.0 10/04/2018 1130   MCH 26.3 10/04/2018 1130   MCHC 30.9 10/04/2018 1130   RDW 14.5 10/04/2018 1130   LYMPHSABS 1.4 05/14/2018 1535   MONOABS 0.5 05/14/2018 1535   EOSABS 0.1 05/14/2018 1535   BASOSABS 0.0 05/14/2018 1535    A/P: Morbid obesity, h/o PMB with ebx with EIN for further sampling by D&C, hysteroscopy. R/B d/w pt and daughter.  Ala Dach 10/15/2018 7:22 AM

## 2018-10-15 NOTE — Brief Op Note (Signed)
10/15/2018  8:34 AM  PATIENT:  Samantha [name]  67 y.o. female  PRE-OPERATIVE DIAGNOSIS:  Endometrial Intraepithelial Neoplasia  POST-OPERATIVE DIAGNOSIS:  Endometrial Intraepithelial Neoplasia  PROCEDURE:  Procedure(s): DILATATION AND CURETTAGE /HYSTEROSCOPY (N/A)  Operative hysteroscopy with directed biopsy  SURGEON:  Surgeon(s) and Role:    * Aloha Gell, MD - Primary  PHYSICIAN ASSISTANT: none  ASSISTANTS: none   ANESTHESIA:   general  EBL:  10 mL   BLOOD ADMINISTERED:none  DRAINS: none   LOCAL MEDICATIONS USED:  NONE  SPECIMEN:  Source of Specimen:  Endometrial curettings  DISPOSITION OF SPECIMEN:  PATHOLOGY  COUNTS:  YES  TOURNIQUET:  * No tourniquets in log *  DICTATION: .Note written in EPIC  PLAN OF CARE: Discharge to home after PACU  PATIENT DISPOSITION:  PACU - hemodynamically stable.   Delay start of Pharmacological VTE agent (>24hrs) due to surgical blood loss or risk of bleeding: yes

## 2018-10-15 NOTE — Discharge Instructions (Signed)
DISCHARGE INSTRUCTIONS: D&C The following instructions have been prepared to help you care for yourself upon your return home.   Personal hygiene:  Use sanitary pads for vaginal drainage, not tampons.  Shower the day after your procedure.  NO tub baths, pools or Jacuzzis for 2-3 weeks.  Wipe front to back after using the bathroom.  Activity and limitations:  Do NOT drive or operate any equipment for 24 hours. The effects of anesthesia are still present and drowsiness may result.  Do NOT rest in bed all day.  Walking is encouraged.  Walk up and down stairs slowly.  You may resume your normal activity in one to two days or as indicated by your physician.  Sexual activity: NO intercourse for at least 2 weeks after the procedure, or as indicated by your physician.  Diet: Eat a light meal as desired this evening. You may resume your usual diet tomorrow.  Return to work: You may resume your work activities in one to two days or as indicated by your doctor.  What to expect after your surgery: Expect to have vaginal bleeding/discharge for 2-3 days and spotting for up to 10 days. It is not unusual to have soreness for up to 1-2 weeks. You may have a slight burning sensation when you urinate for the first day. Mild cramps may continue for a couple of days. You may have a regular period in 2-6 weeks.  Call your doctor for any of the following:  Excessive vaginal bleeding, saturating and changing one pad every hour.  Inability to urinate 6 hours after discharge from hospital.  Pain not relieved by pain medication.  Fever of 100.4 F or greater.  Unusual vaginal discharge or odor.   Post Anesthesia Home Care Instructions  Activity: Get plenty of rest for the remainder of the day. A responsible individual must stay with you for 24 hours following the procedure.  For the next 24 hours, DO NOT: -Drive a car -Operate machinery -Drink alcoholic beverages -Take any medication unless instructed  by your physician -Make any legal decisions or sign important papers.  Meals: Start with liquid foods such as gelatin or soup. Progress to regular foods as tolerated. Avoid greasy, spicy, heavy foods. If nausea and/or vomiting occur, drink only clear liquids until the nausea and/or vomiting subsides. Call your physician if vomiting continues.  Special Instructions/Symptoms: Your throat may feel dry or sore from the anesthesia or the breathing tube placed in your throat during surgery. If this causes discomfort, gargle with warm salt water. The discomfort should disappear within 24 hours.         

## 2018-10-15 NOTE — Anesthesia Postprocedure Evaluation (Signed)
Anesthesia Post Note  Patient: Samantha [name]  Procedure(s) Performed: DILATATION AND CURETTAGE /HYSTEROSCOPY (N/A )     Patient location during evaluation: PACU Anesthesia Type: General Level of consciousness: awake and alert Pain management: pain level controlled Vital Signs Assessment: post-procedure vital signs reviewed and stable Respiratory status: spontaneous breathing, nonlabored ventilation, respiratory function stable and patient connected to nasal cannula oxygen Cardiovascular status: blood pressure returned to baseline and stable Postop Assessment: no apparent nausea or vomiting Anesthetic complications: no    Last Vitals:  Vitals:   10/15/18 0907 10/15/18 0940  BP: (!) 142/96 (!) 137/97  Pulse: 82 79  Resp: 18 18  Temp:    SpO2: 100% 100%    Last Pain:  Vitals:   10/15/18 0907  TempSrc:   PainSc: 0-No pain   Pain Goal: Patients Stated Pain Goal: 3 (10/15/18 0818)               Tiajuana Amass

## 2018-10-15 NOTE — Anesthesia Preprocedure Evaluation (Signed)
Anesthesia Evaluation  Patient identified by MRN, date of birth, ID band Patient awake    Reviewed: Allergy & Precautions, NPO status , Patient's Chart, lab work & pertinent test results  Airway Mallampati: III  TM Distance: >3 FB Neck ROM: Full    Dental  (+) Dental Advisory Given   Pulmonary former smoker,    breath sounds clear to auscultation       Cardiovascular hypertension, Pt. on medications  Rhythm:Regular Rate:Normal     Neuro/Psych negative neurological ROS     GI/Hepatic GERD  ,(+) Hepatitis -, C  Endo/Other  negative endocrine ROS  Renal/GU negative Renal ROS     Musculoskeletal  (+) Arthritis ,   Abdominal (+) + obese,   Peds  Hematology negative hematology ROS (+)   Anesthesia Other Findings   Reproductive/Obstetrics                             Lab Results  Component Value Date   WBC 3.6 (L) 10/04/2018   HGB 11.9 (L) 10/04/2018   HCT 38.5 10/04/2018   MCV 85.0 10/04/2018   PLT 185 10/04/2018   Lab Results  Component Value Date   CREATININE 0.81 10/04/2018   BUN 16 10/04/2018   NA 141 10/04/2018   K 4.3 10/04/2018   CL 109 10/04/2018   CO2 26 10/04/2018    Anesthesia Physical Anesthesia Plan  ASA: III  Anesthesia Plan: General   Post-op Pain Management:    Induction: Intravenous  PONV Risk Score and Plan: 3 and Ondansetron, Dexamethasone and Treatment may vary due to age or medical condition  Airway Management Planned: LMA and Oral ETT  Additional Equipment:   Intra-op Plan:   Post-operative Plan: Extubation in OR  Informed Consent: I have reviewed the patients History and Physical, chart, labs and discussed the procedure including the risks, benefits and alternatives for the proposed anesthesia with the patient or authorized representative who has indicated his/her understanding and acceptance.   Dental advisory given  Plan Discussed with:  CRNA  Anesthesia Plan Comments:         Anesthesia Quick Evaluation

## 2018-10-16 ENCOUNTER — Encounter (HOSPITAL_COMMUNITY): Payer: Self-pay | Admitting: Obstetrics

## 2018-11-21 ENCOUNTER — Other Ambulatory Visit: Payer: Self-pay | Admitting: Internal Medicine

## 2018-11-24 ENCOUNTER — Encounter: Payer: Self-pay | Admitting: Internal Medicine

## 2018-11-24 ENCOUNTER — Ambulatory Visit (INDEPENDENT_AMBULATORY_CARE_PROVIDER_SITE_OTHER): Payer: Medicare Other | Admitting: Internal Medicine

## 2018-11-24 DIAGNOSIS — I10 Essential (primary) hypertension: Secondary | ICD-10-CM

## 2018-11-24 DIAGNOSIS — C220 Liver cell carcinoma: Secondary | ICD-10-CM

## 2018-11-24 DIAGNOSIS — G8929 Other chronic pain: Secondary | ICD-10-CM | POA: Diagnosis not present

## 2018-11-24 DIAGNOSIS — M5440 Lumbago with sciatica, unspecified side: Secondary | ICD-10-CM | POA: Diagnosis not present

## 2018-11-24 MED ORDER — FUROSEMIDE 20 MG PO TABS: 20.0000 mg | ORAL_TABLET | Freq: Every day | ORAL | 3 refills | Status: DC

## 2018-11-24 MED ORDER — CIPROFLOXACIN HCL 250 MG PO TABS: 250.0000 mg | ORAL_TABLET | Freq: Two times a day (BID) | ORAL | 0 refills | Status: DC

## 2018-11-24 MED ORDER — SACCHAROMYCES BOULARDII 250 MG PO CAPS: 250.0000 mg | ORAL_CAPSULE | Freq: Two times a day (BID) | ORAL | 0 refills | Status: DC

## 2018-11-24 MED ORDER — MELOXICAM 7.5 MG PO TABS: ORAL_TABLET | ORAL | 1 refills | Status: DC

## 2018-11-24 MED ORDER — POTASSIUM CHLORIDE CRYS ER 20 MEQ PO TBCR: 20.0000 meq | EXTENDED_RELEASE_TABLET | Freq: Every day | ORAL | 3 refills | Status: DC

## 2018-11-24 MED ORDER — PANTOPRAZOLE SODIUM 40 MG PO TBEC: 40.0000 mg | DELAYED_RELEASE_TABLET | Freq: Every day | ORAL | 3 refills | Status: DC

## 2018-11-24 MED ORDER — LOSARTAN POTASSIUM 100 MG PO TABS: 100.0000 mg | ORAL_TABLET | Freq: Every day | ORAL | 3 refills | Status: DC

## 2018-11-24 NOTE — Assessment & Plan Note (Signed)
F/u w/IR

## 2018-11-24 NOTE — Assessment & Plan Note (Signed)
On diet  Wt Readings from Last 3 Encounters:  11/24/18 298 lb (135.2 kg)  10/04/18 299 lb 6 oz (135.8 kg)  08/24/18 294 lb 3.2 oz (133.4 kg)

## 2018-11-24 NOTE — Assessment & Plan Note (Signed)
Losartan, Norvasc, Lasix Nl BP at home

## 2018-11-24 NOTE — Assessment & Plan Note (Signed)
Doing better.   

## 2018-11-24 NOTE — Patient Instructions (Signed)
Probiotic x 2 weeks

## 2018-11-24 NOTE — Progress Notes (Signed)
Subjective:  Patient ID: Samantha [name], female    DOB: 27-May-1951  Age: 67 y.o. MRN: [id number]  CC: No chief complaint on file.   HPI EVIA GOLDSMITH presents for diarrhea x1 mo and exposure to E coli F/u HTN, OA, liver ca  Outpatient Medications Prior to Visit  Medication Sig Dispense Refill  . aspirin EC 81 MG tablet Take 81 mg by mouth daily.    . Cholecalciferol (EQL VITAMIN D3) 1000 UNITS tablet Take 1,000 Units by mouth daily.     . furosemide (LASIX) 20 MG tablet Take 1-2 tablets (20-40 mg total) by mouth daily. (Patient taking differently: Take 20 mg by mouth daily. ) 180 tablet 3  . glucosamine-chondroitin 500-400 MG tablet Take 1 tablet by mouth daily.    Marland Kitchen ibuprofen (ADVIL,MOTRIN) 600 MG tablet Take twice a day prn pain (Patient taking differently: Take 600 mg by mouth 2 (two) times daily as needed for headache or moderate pain. ) 60 tablet 3  . losartan (COZAAR) 100 MG tablet Take 1 tablet (100 mg total) by mouth daily. 90 tablet 3  . meloxicam (MOBIC) 7.5 MG tablet TAKE 1 TO 2 TABLETS BY MOUTH ONCE DAILY AS NEEDED FOR PAIN 60 tablet 1  . Multiple Vitamin (MULTIVITAMIN WITH MINERALS) TABS tablet Take 1 tablet by mouth daily.     . naproxen sodium (ALEVE) 220 MG tablet Take 220 mg by mouth 2 (two) times daily as needed (for pain or headache).    . oxyCODONE-acetaminophen (PERCOCET/ROXICET) 5-325 MG tablet Take 1-2 tablets by mouth every 4 (four) hours as needed for severe pain. 20 tablet 0  . pantoprazole (PROTONIX) 40 MG tablet Take 1 tablet (40 mg total) by mouth daily. 90 tablet 3  . potassium chloride SA (K-DUR,KLOR-CON) 20 MEQ tablet TAKE ONE TABLET BY MOUTH ONCE DAILY (Patient taking differently: Take 20 mEq by mouth daily. ) 90 tablet 3  . vitamin C (ASCORBIC ACID) 500 MG tablet Take 500 mg by mouth daily.    . meloxicam (MOBIC) 7.5 MG tablet TAKE 1 TO 2 TABLETS BY MOUTH ONCE DAILY AS NEEDED FOR PAIN (Patient taking differently: Take 7.5 mg by mouth daily as needed  for pain. ) 60 tablet 1   No facility-administered medications prior to visit.     ROS: Review of Systems  Constitutional: Negative for activity change, appetite change, chills, fatigue and unexpected weight change.  HENT: Negative for congestion, mouth sores and sinus pressure.   Eyes: Negative for visual disturbance.  Respiratory: Negative for cough and chest tightness.   Gastrointestinal: Positive for diarrhea. Negative for abdominal pain and nausea.  Genitourinary: Negative for difficulty urinating, frequency and vaginal pain.  Musculoskeletal: Negative for back pain and gait problem.  Skin: Negative for pallor and rash.  Neurological: Negative for dizziness, tremors, weakness, numbness and headaches.  Psychiatric/Behavioral: Negative for confusion, sleep disturbance and suicidal ideas.    Objective:  BP (!) 144/88 (BP Location: Left Arm, Patient Position: Sitting, Cuff Size: Large)   Pulse 62   Temp 98.3 F (36.8 C) (Oral)   Ht 5' 1.5" (1.562 m)   Wt 298 lb (135.2 kg)   SpO2 99%   BMI 55.39 kg/m   BP Readings from Last 3 Encounters:  11/24/18 (!) 144/88  10/15/18 (!) 137/97  10/04/18 (!) 148/97    Wt Readings from Last 3 Encounters:  11/24/18 298 lb (135.2 kg)  10/04/18 299 lb 6 oz (135.8 kg)  08/24/18 294 lb 3.2 oz (133.4 kg)  Physical Exam  Constitutional: She appears well-developed. No distress.  HENT:  Head: Normocephalic.  Right Ear: External ear normal.  Left Ear: External ear normal.  Nose: Nose normal.  Mouth/Throat: Oropharynx is clear and moist.  Eyes: Pupils are equal, round, and reactive to light. Conjunctivae are normal. Right eye exhibits no discharge. Left eye exhibits no discharge.  Neck: Normal range of motion. Neck supple. No JVD present. No tracheal deviation present. No thyromegaly present.  Cardiovascular: Normal rate, regular rhythm and normal heart sounds.  Pulmonary/Chest: No stridor. No respiratory distress. She has no wheezes.    Abdominal: Soft. Bowel sounds are normal. She exhibits no distension and no mass. There is no tenderness. There is no rebound and no guarding.  Musculoskeletal: She exhibits no edema or tenderness.  Lymphadenopathy:    She has no cervical adenopathy.  Neurological: She displays normal reflexes. No cranial nerve deficit. She exhibits normal muscle tone. Coordination normal.  Skin: No rash noted. No erythema.  Psychiatric: She has a normal mood and affect. Her behavior is normal. Judgment and thought content normal.   Obese  Lab Results  Component Value Date   WBC 3.6 (L) 10/04/2018   HGB 11.9 (L) 10/04/2018   HCT 38.5 10/04/2018   PLT 185 10/04/2018   GLUCOSE 89 10/04/2018   CHOL 154 10/20/2017   TRIG 132.0 10/20/2017   HDL 44.80 10/20/2017   LDLCALC 82 10/20/2017   ALT 9 05/27/2018   AST 14 05/27/2018   NA 141 10/04/2018   K 4.3 10/04/2018   CL 109 10/04/2018   CREATININE 0.81 10/04/2018   BUN 16 10/04/2018   CO2 26 10/04/2018   TSH 1.91 10/20/2017   INR 1.0 05/27/2018   HGBA1C 5.8 08/14/2015    No results found.  Assessment & Plan:   There are no diagnoses linked to this encounter.   No orders of the defined types were placed in this encounter.    Follow-up: No follow-ups on file.  Walker Kehr, MD

## 2018-11-29 ENCOUNTER — Other Ambulatory Visit: Payer: Self-pay | Admitting: Nurse Practitioner

## 2018-11-29 DIAGNOSIS — D751 Secondary polycythemia: Secondary | ICD-10-CM

## 2018-11-29 DIAGNOSIS — C220 Liver cell carcinoma: Secondary | ICD-10-CM

## 2018-11-29 DIAGNOSIS — K7469 Other cirrhosis of liver: Secondary | ICD-10-CM

## 2018-12-23 ENCOUNTER — Other Ambulatory Visit: Payer: Self-pay | Admitting: Internal Medicine

## 2019-02-03 DIAGNOSIS — C220 Liver cell carcinoma: Secondary | ICD-10-CM | POA: Diagnosis not present

## 2019-02-03 DIAGNOSIS — K7469 Other cirrhosis of liver: Secondary | ICD-10-CM | POA: Diagnosis not present

## 2019-02-04 DIAGNOSIS — C220 Liver cell carcinoma: Secondary | ICD-10-CM | POA: Diagnosis not present

## 2019-02-04 DIAGNOSIS — K7469 Other cirrhosis of liver: Secondary | ICD-10-CM | POA: Diagnosis not present

## 2019-02-17 ENCOUNTER — Other Ambulatory Visit (HOSPITAL_COMMUNITY): Payer: Self-pay | Admitting: Interventional Radiology

## 2019-02-17 DIAGNOSIS — C220 Liver cell carcinoma: Secondary | ICD-10-CM

## 2019-02-28 ENCOUNTER — Ambulatory Visit
Admission: RE | Admit: 2019-02-28 | Discharge: 2019-02-28 | Disposition: A | Discharge status: Home / Self Care | Payer: Medicare Other | Source: Ambulatory Visit | Attending: Nurse Practitioner | Admitting: Nurse Practitioner

## 2019-02-28 DIAGNOSIS — C220 Liver cell carcinoma: Secondary | ICD-10-CM

## 2019-02-28 DIAGNOSIS — D751 Secondary polycythemia: Secondary | ICD-10-CM

## 2019-02-28 DIAGNOSIS — K7469 Other cirrhosis of liver: Secondary | ICD-10-CM

## 2019-02-28 MED ORDER — GADOXETATE DISODIUM 0.25 MMOL/ML IV SOLN
10.0000 mL | Freq: Once | INTRAVENOUS | Status: AC | PRN
  Administered 2019-02-28: 10 mL via INTRAVENOUS

## 2019-03-15 ENCOUNTER — Telehealth: Payer: Self-pay | Admitting: Interventional Radiology

## 2019-03-15 ENCOUNTER — Other Ambulatory Visit: Payer: Medicare Other

## 2019-03-15 DIAGNOSIS — K7581 Nonalcoholic steatohepatitis (NASH): Secondary | ICD-10-CM | POA: Diagnosis not present

## 2019-03-15 DIAGNOSIS — C220 Liver cell carcinoma: Secondary | ICD-10-CM | POA: Diagnosis not present

## 2019-03-15 NOTE — Telephone Encounter (Signed)
Chief Complaint: Patient was evaluated via telephone today for  Chief Complaint  Patient presents with   Hepatic Cancer    Referring Physician(s): Dr. Truitt Merle  History of Present Illness: Samantha [name] is a 68 y.o. 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  She underwent conventional lipiodol transarterial chemo embolization on 2/23 and then subsequent CT guided thermal ablation of this lesion on 03/21/2016.  She presents today for continued follow-up evaluation.  Her most recent MR imaging from 02/28/2019 demonstrates no evidence of residual or recurrent disease.  She is now greater than 3 years out and remains disease-free.  She is feeling well and has no complaints including abdominal pain, nausea, vomiting, diarrhea, chest pain or shortness of breath.  Past Medical History:  Diagnosis Date   Allergy    seasonal   Cancer ([place]) 2017   liver cancer IN REMISSION. WHEN IDAGNONED SIZE OF GRAPEFRUIT    Elevated glucose    no longer an issue 03-18-16   GERD (gastroesophageal reflux disease)    H/O seasonal allergies    Hepatitis C    hepatitis c completed march 2017   Hyperlipidemia 2011   Hypertension    LBP (low back pain)    Obesity    Osteoarthritis    B knees    Past Surgical History:  Procedure Laterality Date   CHOLECYSTECTOMY     COLONOSCOPY N/A 01/09/2015   Procedure: COLONOSCOPY;  Surgeon: Jerene Bears, MD;  Location: [facility];  Service: Gastroenterology;  Laterality: N/A;   ESOPHAGOGASTRODUODENOSCOPY (EGD) WITH PROPOFOL N/A 06/10/2016   Procedure: ESOPHAGOGASTRODUODENOSCOPY (EGD) WITH PROPOFOL;  Surgeon: Jerene Bears, MD;  Location: [facility];  Service: Gastroenterology;  Laterality: N/A;   HYSTEROSCOPY W/D&C N/A 10/15/2018   Procedure: DILATATION AND CURETTAGE /HYSTEROSCOPY;  Surgeon: Aloha Gell, MD;  Location: [facility];   Service: Gynecology;  Laterality: N/A;   IR GENERIC HISTORICAL  07/03/2016   IR RADIOLOGIST EVAL & MGMT 07/03/2016 [name], MD [facility]   IR GENERIC HISTORICAL  11/18/2016   IR RADIOLOGIST EVAL & MGMT 11/18/2016 [name], MD [facility]   IR RADIOLOGIST EVAL & MGMT  04/09/2017   IR RADIOLOGIST EVAL & MGMT  11/05/2017   JOINT REPLACEMENT     BILATERAL KNEES   left shoulder arthroplasty     right knee replacement  2007   total knee replacement L  03-13-08   TUBAL LIGATION      Allergies: Amlodipine; Benazepril hcl; and Codeine  Medications: Prior to Admission medications   Medication Sig Start Date End Date Taking? Authorizing Provider  aspirin EC 81 MG tablet Take 81 mg by mouth daily.    [provider]  Cholecalciferol (EQL VITAMIN D3) 1000 UNITS tablet Take 1,000 Units by mouth daily.     [provider]  ciprofloxacin (CIPRO) 250 MG tablet Take 1 tablet (250 mg total) by mouth 2 (two) times daily. 11/24/18   Plotnikov, Evie Lacks, MD  furosemide (LASIX) 20 MG tablet Take 1-2 tablets (20-40 mg total) by mouth daily. 11/24/18   Plotnikov, Evie Lacks, MD  glucosamine-chondroitin 500-400 MG tablet Take 1 tablet by mouth daily.    [provider]  losartan (COZAAR) 100 MG tablet Take 1 tablet (100 mg total) by mouth daily. 11/24/18   Plotnikov, Evie Lacks, MD  meloxicam (MOBIC) 7.5 MG tablet TAKE 1 TO 2 TABLETS BY MOUTH ONCE  DAILY AS NEEDED FOR PAIN 11/24/18   Plotnikov, Evie Lacks, MD  Multiple Vitamin (MULTIVITAMIN WITH MINERALS) TABS tablet Take 1 tablet by mouth daily.     [provider]  oxyCODONE-acetaminophen (PERCOCET/ROXICET) 5-325 MG tablet Take 1-2 tablets by mouth every 4 (four) hours as needed for severe pain. 10/15/18   Aloha Gell, MD  pantoprazole (PROTONIX) 40 MG tablet Take 1 tablet (40 mg total) by mouth daily. 11/24/18   Plotnikov, Evie Lacks, MD  potassium chloride SA (K-DUR,KLOR-CON) 20 MEQ tablet  Take 1 tablet (20 mEq total) by mouth daily. 11/24/18   Plotnikov, Evie Lacks, MD  saccharomyces boulardii (FLORASTOR) 250 MG capsule Take 1 capsule (250 mg total) by mouth 2 (two) times daily. 11/24/18   Plotnikov, Evie Lacks, MD  vitamin C (ASCORBIC ACID) 500 MG tablet Take 500 mg by mouth daily.    [provider]     Family History  Problem Relation Age of Onset   Hypertension Mother    Ovarian cancer Mother    Hypertension Father    Cancer Father 20       prostate cancer    Diabetes Father    Diabetes Other    Hypertension Other    Colon cancer Neg Hx    Esophageal cancer Neg Hx    Rectal cancer Neg Hx    Stomach cancer Neg Hx    Breast cancer Neg Hx     Social History   Socioeconomic History   Marital status: Single    Spouse name: Not on file   Number of children: 4   Years of education: Not on file   Highest education level: Not on file  Occupational History   Not on file  Social Needs   Financial resource strain: Not hard at all   Food insecurity:    Worry: Never true    Inability: Never true   Transportation needs:    Medical: No    Non-medical: No  Tobacco Use   Smoking status: Former Smoker    Packs/day: 0.25    Years: 20.00    Pack years: 5.00    Last attempt to quit: 12/30/2007    Years since quitting: 11.2   Smokeless tobacco: Never Used  Substance and Sexual Activity   Alcohol use: No    Comment: 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, quit drinking alcohol in 10/2015    Drug use: No   Sexual activity: Not Currently  Lifestyle   Physical activity:    Days per week: 3 days    Minutes per session: 30 min   Stress: Not at all  Relationships   Social connections:    Talks on phone: More than three times a week    Gets together: More than three times a week    Attends religious service: More than 4 times per year    Active member of club or organization: Yes    Attends meetings of clubs or  organizations: More than 4 times per year    Relationship status: Not on file  Other Topics Concern   Not on file  Social History Narrative   Regular exercise: walk a few times a week   Caffeine use: none   Single, raising her young great granddaughter and watches grandchildren after school   Has a room mate to help prn   Retired from nursing tech--worked at [employer]   Independent/drives    ECOG Status: 0 - Asymptomatic  Review of Systems: A 12 point ROS discussed and pertinent positives are indicated in the HPI above.  All other systems are negative.   Imaging: Mr Abdomen Wwo Contrast  Result Date: 02/28/2019 CLINICAL DATA:  Follow-up of hepatocellular carcinoma. Prior thermal ablation in 2017. EXAM: MRI ABDOMEN WITHOUT AND WITH CONTRAST TECHNIQUE: Multiplanar multisequence MR imaging of the abdomen was performed both before and after the administration of intravenous contrast. CONTRAST:  40mL EOVIST GADOXETATE DISODIUM 0.25 MOL/L IV SOLN Creatinine was obtained on site at [facility] at [address] Results: Creatinine 0.8 mg/dL. COMPARISON:  05/28/2018 FINDINGS: Lower chest: Mild cardiomegaly, without pericardial or pleural effusion. Hepatobiliary: Again identified is the ablation defect within segment 8. This measures 2.9 x 2.2 cm on image 37/10. Compare 3.3 x 2.4 cm on the prior. Precontrast heterogeneous T1 signal, without solid enhancing components. No new liver lesions identified. Cholecystectomy, without biliary ductal dilatation. Pancreas:  Normal, without mass or ductal dilatation. Spleen:  Normal in size, without focal abnormality. Adrenals/Urinary Tract: Normal adrenal glands. Normal kidneys, without hydronephrosis. Stomach/Bowel: Normal stomach and abdominal bowel loops. Vascular/Lymphatic: Normal caliber of the aorta and branch vessels. Patent portal and hepatic veins. No abdominal adenopathy. Other: No ascites. Incidental note is made of heterogeneous  soft tissue fullness within the uterus, likely due to fibroids. Example image 17/3. Musculoskeletal: No acute osseous abnormality. IMPRESSION: 1. Similar appearance of right hepatic lobe ablation defect. No evidence of locally recurrent or metachronous hepatocellular carcinoma. 2.  No acute abdominal process. Electronically Signed   By: Abigail Miyamoto M.D.   On: 02/28/2019 10:32    Labs:  CBC: Recent Labs    05/14/18 1535 05/27/18 1531 10/04/18 1130  WBC 4.0 4.4 3.6*  HGB 12.2 12.0 11.9*  HCT 38.5 36.8 38.5  PLT 199.0 195 185    COAGS: Recent Labs    05/14/18 1535 05/27/18 1531  INR 1.0 1.0    BMP: Recent Labs    05/14/18 1535 05/27/18 1531 10/04/18 1130  NA 142 144 141  K 3.9 4.4 4.3  CL 109 111* 109  CO2 26 25 26   GLUCOSE 92 89 89  BUN 13 19 16   CALCIUM 9.3 9.2 9.1  CREATININE 0.80 0.85 0.81  GFRNONAA  --  70 >60  GFRAA  --  82 >60    LIVER FUNCTION TESTS: Recent Labs    05/14/18 1535 05/27/18 1531  BILITOT 0.5 0.3  AST 17 14  ALT 10 9  ALKPHOS 86  --   PROT 7.5 7.0  ALBUMIN 4.0  --     TUMOR MARKERS: Recent Labs    05/27/18 1531  AFPTM 6.4*    Assessment and Plan:  Continuing to do well clinically.  Follow-up MRI demonstrates no evidence of recurrent disease and no evidence of new hepatocellular carcinoma.  1.)  Repeat MRI and follow-up clinic visit in 1 year.  Thank you for this interesting consult.  I greatly enjoyed meeting Samantha [name] and look forward to participating in their care.  A copy of this report was sent to the requesting provider on this date.  Electronically Signed: [name] 03/15/2019, 5:30 PM   I spent a total of 15 Minutes in face to face in clinical consultation, greater than 50% of which was counseling/coordinating care for hepatocellular cancer.

## 2019-03-17 DIAGNOSIS — H2513 Age-related nuclear cataract, bilateral: Secondary | ICD-10-CM | POA: Diagnosis not present

## 2019-03-29 ENCOUNTER — Ambulatory Visit: Payer: Medicare Other | Admitting: Internal Medicine

## 2019-06-13 ENCOUNTER — Ambulatory Visit (INDEPENDENT_AMBULATORY_CARE_PROVIDER_SITE_OTHER): Payer: Medicare Other | Admitting: Internal Medicine

## 2019-06-13 ENCOUNTER — Encounter: Payer: Self-pay | Admitting: Internal Medicine

## 2019-06-13 ENCOUNTER — Other Ambulatory Visit: Payer: Self-pay | Admitting: Internal Medicine

## 2019-06-13 ENCOUNTER — Other Ambulatory Visit: Payer: Self-pay

## 2019-06-13 DIAGNOSIS — G8929 Other chronic pain: Secondary | ICD-10-CM

## 2019-06-13 DIAGNOSIS — N3090 Cystitis, unspecified without hematuria: Secondary | ICD-10-CM

## 2019-06-13 DIAGNOSIS — K760 Fatty (change of) liver, not elsewhere classified: Secondary | ICD-10-CM | POA: Diagnosis not present

## 2019-06-13 DIAGNOSIS — R600 Localized edema: Secondary | ICD-10-CM

## 2019-06-13 DIAGNOSIS — C220 Liver cell carcinoma: Secondary | ICD-10-CM

## 2019-06-13 DIAGNOSIS — M5440 Lumbago with sciatica, unspecified side: Secondary | ICD-10-CM

## 2019-06-13 DIAGNOSIS — Z1231 Encounter for screening mammogram for malignant neoplasm of breast: Secondary | ICD-10-CM

## 2019-06-13 DIAGNOSIS — K219 Gastro-esophageal reflux disease without esophagitis: Secondary | ICD-10-CM

## 2019-06-13 DIAGNOSIS — I10 Essential (primary) hypertension: Secondary | ICD-10-CM

## 2019-06-13 MED ORDER — LOSARTAN POTASSIUM 100 MG PO TABS: 100.0000 mg | ORAL_TABLET | Freq: Every day | ORAL | 3 refills | Status: DC

## 2019-06-13 MED ORDER — POTASSIUM CHLORIDE CRYS ER 20 MEQ PO TBCR: 20.0000 meq | EXTENDED_RELEASE_TABLET | Freq: Every day | ORAL | 3 refills | Status: DC

## 2019-06-13 MED ORDER — FUROSEMIDE 20 MG PO TABS: 20.0000 mg | ORAL_TABLET | Freq: Every day | ORAL | 3 refills | Status: DC

## 2019-06-13 MED ORDER — MELOXICAM 7.5 MG PO TABS: ORAL_TABLET | ORAL | 1 refills | Status: DC

## 2019-06-13 MED ORDER — CIPROFLOXACIN HCL 250 MG PO TABS: 250.0000 mg | ORAL_TABLET | Freq: Two times a day (BID) | ORAL | 0 refills | Status: DC

## 2019-06-13 MED ORDER — PANTOPRAZOLE SODIUM 40 MG PO TBEC: 40.0000 mg | DELAYED_RELEASE_TABLET | Freq: Every day | ORAL | 3 refills | Status: DC

## 2019-06-13 NOTE — Assessment & Plan Note (Signed)
F/u w/Dr Laurence Ferrari (IR)

## 2019-06-13 NOTE — Assessment & Plan Note (Signed)
Wt Readings from Last 3 Encounters:  06/13/19 294 lb (133.4 kg)  11/24/18 298 lb (135.2 kg)  10/04/18 299 lb 6 oz (135.8 kg)

## 2019-06-13 NOTE — Assessment & Plan Note (Signed)
Lasix

## 2019-06-13 NOTE — Assessment & Plan Note (Signed)
Meloxicam, Oxycodone prn

## 2019-06-13 NOTE — Assessment & Plan Note (Signed)
Cystitis

## 2019-06-13 NOTE — Assessment & Plan Note (Signed)
Losartan, Norvasc, Lasix 

## 2019-06-13 NOTE — Assessment & Plan Note (Signed)
Pt lost wt 

## 2019-06-13 NOTE — Assessment & Plan Note (Signed)
protonix

## 2019-06-13 NOTE — Progress Notes (Signed)
Subjective:  Patient ID: Samantha [name], female    DOB: 1951-09-14  Age: 68 y.o. MRN: [id number]  CC: No chief complaint on file.   HPI Samantha [name] presents for liver ca, GERD, edema f/u. Had labs at [facility]  Outpatient Medications Prior to Visit  Medication Sig Dispense Refill  . aspirin EC 81 MG tablet Take 81 mg by mouth daily.    . Cholecalciferol (EQL VITAMIN D3) 1000 UNITS tablet Take 1,000 Units by mouth daily.     . ciprofloxacin (CIPRO) 250 MG tablet Take 1 tablet (250 mg total) by mouth 2 (two) times daily. 10 tablet 0  . furosemide (LASIX) 20 MG tablet Take 1-2 tablets (20-40 mg total) by mouth daily. 180 tablet 3  . glucosamine-chondroitin 500-400 MG tablet Take 1 tablet by mouth daily.    Marland Kitchen losartan (COZAAR) 100 MG tablet Take 1 tablet (100 mg total) by mouth daily. 90 tablet 3  . meloxicam (MOBIC) 7.5 MG tablet TAKE 1 TO 2 TABLETS BY MOUTH ONCE DAILY AS NEEDED FOR PAIN 60 tablet 1  . Multiple Vitamin (MULTIVITAMIN WITH MINERALS) TABS tablet Take 1 tablet by mouth daily.     Marland Kitchen oxyCODONE-acetaminophen (PERCOCET/ROXICET) 5-325 MG tablet Take 1-2 tablets by mouth every 4 (four) hours as needed for severe pain. 20 tablet 0  . pantoprazole (PROTONIX) 40 MG tablet Take 1 tablet (40 mg total) by mouth daily. 90 tablet 3  . potassium chloride SA (K-DUR,KLOR-CON) 20 MEQ tablet Take 1 tablet (20 mEq total) by mouth daily. 90 tablet 3  . saccharomyces boulardii (FLORASTOR) 250 MG capsule Take 1 capsule (250 mg total) by mouth 2 (two) times daily. 30 capsule 0  . vitamin C (ASCORBIC ACID) 500 MG tablet Take 500 mg by mouth daily.     No facility-administered medications prior to visit.     ROS: Review of Systems  Constitutional: Negative for activity change, appetite change, chills, fatigue and unexpected weight change.  HENT: Negative for congestion, mouth sores and sinus pressure.   Eyes: Negative for visual disturbance.  Respiratory: Negative for cough and chest  tightness.   Gastrointestinal: Negative for abdominal pain and nausea.  Genitourinary: Negative for difficulty urinating, frequency and vaginal pain.  Musculoskeletal: Negative for back pain and gait problem.  Skin: Negative for pallor and rash.  Neurological: Negative for dizziness, tremors, weakness, numbness and headaches.  Psychiatric/Behavioral: Negative for confusion and sleep disturbance.    Objective:  BP 136/84 (BP Location: Left Arm, Patient Position: Sitting, Cuff Size: Large)   Pulse 98   Temp 98.5 F (36.9 C) (Oral)   Ht 5' 1.5" (1.562 m)   Wt 294 lb (133.4 kg)   SpO2 99%   BMI 54.65 kg/m   BP Readings from Last 3 Encounters:  06/13/19 136/84  11/24/18 (!) 144/88  10/15/18 (!) 137/97    Wt Readings from Last 3 Encounters:  06/13/19 294 lb (133.4 kg)  11/24/18 298 lb (135.2 kg)  10/04/18 299 lb 6 oz (135.8 kg)    Physical Exam Constitutional:      General: She is not in acute distress.    Appearance: She is well-developed.  HENT:     Head: Normocephalic.     Right Ear: External ear normal.     Left Ear: External ear normal.     Nose: Nose normal.  Eyes:     General:        Right eye: No discharge.  Left eye: No discharge.     Conjunctiva/sclera: Conjunctivae normal.     Pupils: Pupils are equal, round, and reactive to light.  Neck:     Musculoskeletal: Normal range of motion and neck supple.     Thyroid: No thyromegaly.     Vascular: No JVD.     Trachea: No tracheal deviation.  Cardiovascular:     Rate and Rhythm: Normal rate and regular rhythm.     Heart sounds: Normal heart sounds.  Pulmonary:     Effort: No respiratory distress.     Breath sounds: No stridor. No wheezing.  Abdominal:     General: Bowel sounds are normal. There is no distension.     Palpations: Abdomen is soft. There is no mass.     Tenderness: There is no abdominal tenderness. There is no guarding or rebound.  Musculoskeletal:        General: No tenderness.   Lymphadenopathy:     Cervical: No cervical adenopathy.  Skin:    Findings: No erythema or rash.  Neurological:     Cranial Nerves: No cranial nerve deficit.     Motor: No abnormal muscle tone.     Coordination: Coordination normal.     Deep Tendon Reflexes: Reflexes normal.  Psychiatric:        Behavior: Behavior normal.        Thought Content: Thought content normal.        Judgment: Judgment normal.   Obese  Lab Results  Component Value Date   WBC 3.6 (L) 10/04/2018   HGB 11.9 (L) 10/04/2018   HCT 38.5 10/04/2018   PLT 185 10/04/2018   GLUCOSE 89 10/04/2018   CHOL 154 10/20/2017   TRIG 132.0 10/20/2017   HDL 44.80 10/20/2017   LDLCALC 82 10/20/2017   ALT 9 05/27/2018   AST 14 05/27/2018   NA 141 10/04/2018   K 4.3 10/04/2018   CL 109 10/04/2018   CREATININE 0.81 10/04/2018   BUN 16 10/04/2018   CO2 26 10/04/2018   TSH 1.91 10/20/2017   INR 1.0 05/27/2018   HGBA1C 5.8 08/14/2015    Mr Abdomen Wwo Contrast  Result Date: 02/28/2019 CLINICAL DATA:  Follow-up of hepatocellular carcinoma. Prior thermal ablation in 2017. EXAM: MRI ABDOMEN WITHOUT AND WITH CONTRAST TECHNIQUE: Multiplanar multisequence MR imaging of the abdomen was performed both before and after the administration of intravenous contrast. CONTRAST:  26mL EOVIST GADOXETATE DISODIUM 0.25 MOL/L IV SOLN Creatinine was obtained on site at [facility] at [address] Results: Creatinine 0.8 mg/dL. COMPARISON:  05/28/2018 FINDINGS: Lower chest: Mild cardiomegaly, without pericardial or pleural effusion. Hepatobiliary: Again identified is the ablation defect within segment 8. This measures 2.9 x 2.2 cm on image 37/10. Compare 3.3 x 2.4 cm on the prior. Precontrast heterogeneous T1 signal, without solid enhancing components. No new liver lesions identified. Cholecystectomy, without biliary ductal dilatation. Pancreas:  Normal, without mass or ductal dilatation. Spleen:  Normal in size, without focal  abnormality. Adrenals/Urinary Tract: Normal adrenal glands. Normal kidneys, without hydronephrosis. Stomach/Bowel: Normal stomach and abdominal bowel loops. Vascular/Lymphatic: Normal caliber of the aorta and branch vessels. Patent portal and hepatic veins. No abdominal adenopathy. Other: No ascites. Incidental note is made of heterogeneous soft tissue fullness within the uterus, likely due to fibroids. Example image 17/3. Musculoskeletal: No acute osseous abnormality. IMPRESSION: 1. Similar appearance of right hepatic lobe ablation defect. No evidence of locally recurrent or metachronous hepatocellular carcinoma. 2.  No acute abdominal process. Electronically Signed  By: Abigail Miyamoto M.D.   On: 02/28/2019 10:32    Assessment & Plan:   There are no diagnoses linked to this encounter.   No orders of the defined types were placed in this encounter.    Follow-up: No follow-ups on file.  Walker Kehr, MD

## 2019-06-15 ENCOUNTER — Other Ambulatory Visit: Payer: Self-pay

## 2019-06-15 ENCOUNTER — Ambulatory Visit
Admission: RE | Admit: 2019-06-15 | Discharge: 2019-06-15 | Disposition: A | Discharge status: Home / Self Care | Payer: Medicare Other | Source: Ambulatory Visit | Attending: Internal Medicine | Admitting: Internal Medicine

## 2019-06-15 ENCOUNTER — Encounter: Payer: Self-pay | Admitting: Internal Medicine

## 2019-06-15 DIAGNOSIS — Z1231 Encounter for screening mammogram for malignant neoplasm of breast: Secondary | ICD-10-CM | POA: Diagnosis not present

## 2019-06-28 ENCOUNTER — Other Ambulatory Visit: Payer: Self-pay

## 2019-06-28 ENCOUNTER — Telehealth: Payer: Self-pay | Admitting: Internal Medicine

## 2019-06-28 DIAGNOSIS — K7460 Unspecified cirrhosis of liver: Secondary | ICD-10-CM

## 2019-06-28 DIAGNOSIS — H2513 Age-related nuclear cataract, bilateral: Secondary | ICD-10-CM | POA: Diagnosis not present

## 2019-06-28 DIAGNOSIS — H524 Presbyopia: Secondary | ICD-10-CM | POA: Diagnosis not present

## 2019-06-28 NOTE — Telephone Encounter (Signed)
Offered pt an appt for EGD at [facility] 07/04/19. Pt states that is to soon, she would like to wait for next hospital date.

## 2019-06-28 NOTE — Telephone Encounter (Signed)
Patient called to schedule endo

## 2019-06-28 NOTE — Telephone Encounter (Signed)
Pt called back and decided to proceed with EGD on Monday 07/04/19. Pt scheduled for covid testing Thursday 06/30/19, EGD scheduled at [facility] 07/04/19@10 :15am. Pt aware that she needs to quarantine after the testing. Pt to be NPO after midnight. Pt aware of all instructions and appts. Amb referral in epic.

## 2019-06-30 ENCOUNTER — Other Ambulatory Visit: Payer: Self-pay

## 2019-06-30 ENCOUNTER — Other Ambulatory Visit (HOSPITAL_COMMUNITY)
Admission: RE | Admit: 2019-06-30 | Discharge: 2019-06-30 | Disposition: A | Discharge status: Home / Self Care | Payer: Medicare Other | Source: Ambulatory Visit | Attending: Internal Medicine | Admitting: Internal Medicine

## 2019-06-30 ENCOUNTER — Encounter (HOSPITAL_COMMUNITY): Payer: Self-pay

## 2019-06-30 DIAGNOSIS — Z1159 Encounter for screening for other viral diseases: Secondary | ICD-10-CM | POA: Insufficient documentation

## 2019-06-30 LAB — SARS CORONAVIRUS 2 (TAT 6-24 HRS): SARS Coronavirus 2: NEGATIVE

## 2019-06-30 NOTE — Progress Notes (Signed)
SPOKE W/  Samantha [name]     SCREENING SYMPTOMS OF COVID 19:   COUGH--NO  RUNNY NOSE--- NO  SORE THROAT---NO  NASAL CONGESTION----NO  SNEEZING----NO  SHORTNESS OF BREATH---NO  DIFFICULTY BREATHING---NO  TEMP >100.0 -----NO  UNEXPLAINED BODY ACHES------NO  CHILLS -------- NO  HEADACHES ---------NO  LOSS OF SMELL/ TASTE --------NO    HAVE YOU OR ANY FAMILY MEMBER TRAVELLED PAST 14 DAYS OUT OF THE   COUNTY---NO STATE----NO COUNTRY----NO  HAVE YOU OR ANY FAMILY MEMBER BEEN EXPOSED TO ANYONE WITH COVID 19? NO

## 2019-07-04 ENCOUNTER — Ambulatory Visit (HOSPITAL_COMMUNITY)
Admission: RE | Admit: 2019-07-04 | Discharge: 2019-07-04 | Disposition: A | Discharge status: Home / Self Care | Payer: Medicare Other | Attending: Internal Medicine | Admitting: Internal Medicine

## 2019-07-04 ENCOUNTER — Encounter (HOSPITAL_COMMUNITY): Payer: Self-pay

## 2019-07-04 ENCOUNTER — Ambulatory Visit (HOSPITAL_COMMUNITY): Payer: Medicare Other | Admitting: Certified Registered Nurse Anesthetist

## 2019-07-04 ENCOUNTER — Other Ambulatory Visit: Payer: Self-pay

## 2019-07-04 ENCOUNTER — Encounter (HOSPITAL_COMMUNITY)
Admission: RE | Disposition: A | Discharge status: Home / Self Care | Payer: Self-pay | Source: Home / Self Care | Attending: Internal Medicine

## 2019-07-04 DIAGNOSIS — K319 Disease of stomach and duodenum, unspecified: Secondary | ICD-10-CM | POA: Diagnosis not present

## 2019-07-04 DIAGNOSIS — Z87891 Personal history of nicotine dependence: Secondary | ICD-10-CM | POA: Insufficient documentation

## 2019-07-04 DIAGNOSIS — Z96653 Presence of artificial knee joint, bilateral: Secondary | ICD-10-CM | POA: Diagnosis not present

## 2019-07-04 DIAGNOSIS — K7460 Unspecified cirrhosis of liver: Secondary | ICD-10-CM | POA: Diagnosis not present

## 2019-07-04 DIAGNOSIS — K7469 Other cirrhosis of liver: Secondary | ICD-10-CM | POA: Diagnosis not present

## 2019-07-04 DIAGNOSIS — K2950 Unspecified chronic gastritis without bleeding: Secondary | ICD-10-CM | POA: Insufficient documentation

## 2019-07-04 DIAGNOSIS — I10 Essential (primary) hypertension: Secondary | ICD-10-CM | POA: Insufficient documentation

## 2019-07-04 DIAGNOSIS — Z888 Allergy status to other drugs, medicaments and biological substances status: Secondary | ICD-10-CM | POA: Insufficient documentation

## 2019-07-04 DIAGNOSIS — K2990 Gastroduodenitis, unspecified, without bleeding: Secondary | ICD-10-CM | POA: Diagnosis not present

## 2019-07-04 DIAGNOSIS — K219 Gastro-esophageal reflux disease without esophagitis: Secondary | ICD-10-CM | POA: Insufficient documentation

## 2019-07-04 DIAGNOSIS — Z96612 Presence of left artificial shoulder joint: Secondary | ICD-10-CM | POA: Diagnosis not present

## 2019-07-04 DIAGNOSIS — Z6841 Body Mass Index (BMI) 40.0 and over, adult: Secondary | ICD-10-CM | POA: Diagnosis not present

## 2019-07-04 DIAGNOSIS — Z885 Allergy status to narcotic agent status: Secondary | ICD-10-CM | POA: Diagnosis not present

## 2019-07-04 DIAGNOSIS — K2970 Gastritis, unspecified, without bleeding: Secondary | ICD-10-CM

## 2019-07-04 DIAGNOSIS — K228 Other specified diseases of esophagus: Secondary | ICD-10-CM

## 2019-07-04 DIAGNOSIS — Z8505 Personal history of malignant neoplasm of liver: Secondary | ICD-10-CM | POA: Insufficient documentation

## 2019-07-04 DIAGNOSIS — K229 Disease of esophagus, unspecified: Secondary | ICD-10-CM

## 2019-07-04 HISTORY — DX: Diverticulosis of intestine, part unspecified, without perforation or abscess without bleeding: K57.90

## 2019-07-04 HISTORY — PX: BIOPSY: SHX5522

## 2019-07-04 HISTORY — PX: ESOPHAGOGASTRODUODENOSCOPY (EGD) WITH PROPOFOL: SHX5813

## 2019-07-04 HISTORY — DX: Morbid (severe) obesity due to excess calories: E66.01

## 2019-07-04 HISTORY — DX: Solitary pulmonary nodule: R91.1

## 2019-07-04 HISTORY — DX: Presence of spectacles and contact lenses: Z97.3

## 2019-07-04 HISTORY — DX: Edema, unspecified: R60.9

## 2019-07-04 HISTORY — DX: Leiomyoma of uterus, unspecified: D25.9

## 2019-07-04 SURGERY — ESOPHAGOGASTRODUODENOSCOPY (EGD) WITH PROPOFOL
Anesthesia: Monitor Anesthesia Care

## 2019-07-04 MED ORDER — ONDANSETRON HCL 4 MG/2ML IJ SOLN
INTRAMUSCULAR | Status: DC | PRN
  Administered 2019-07-04: 4 mg via INTRAVENOUS

## 2019-07-04 MED ORDER — LACTATED RINGERS IV SOLN
INTRAVENOUS | Status: DC
  Administered 2019-07-04: 09:00:00 via INTRAVENOUS

## 2019-07-04 MED ORDER — LABETALOL HCL 5 MG/ML IV SOLN
10.0000 mg | Freq: Once | INTRAVENOUS | Status: AC
  Administered 2019-07-04: 10:00:00 10 mg via INTRAVENOUS

## 2019-07-04 MED ORDER — PROPOFOL 500 MG/50ML IV EMUL
INTRAVENOUS | Status: DC | PRN
  Administered 2019-07-04: 140 ug/kg/min via INTRAVENOUS

## 2019-07-04 MED ORDER — FENTANYL CITRATE (PF) 100 MCG/2ML IJ SOLN: 25.0000 ug | INTRAMUSCULAR | Status: DC | PRN

## 2019-07-04 MED ORDER — LIDOCAINE 2% (20 MG/ML) 5 ML SYRINGE
INTRAMUSCULAR | Status: DC | PRN
  Administered 2019-07-04: 60 mg via INTRAVENOUS

## 2019-07-04 MED ORDER — LABETALOL HCL 5 MG/ML IV SOLN
INTRAVENOUS | Status: AC
  Filled 2019-07-04: qty 4

## 2019-07-04 MED ORDER — PROPOFOL 10 MG/ML IV BOLUS
INTRAVENOUS | Status: DC | PRN
  Administered 2019-07-04: 20 mg via INTRAVENOUS

## 2019-07-04 MED ORDER — ONDANSETRON HCL 4 MG/2ML IJ SOLN: 4.0000 mg | Freq: Once | INTRAMUSCULAR | Status: DC | PRN

## 2019-07-04 SURGICAL SUPPLY — 15 items

## 2019-07-04 NOTE — Anesthesia Preprocedure Evaluation (Signed)
Anesthesia Evaluation  Patient identified by MRN, date of birth, ID band Patient awake    Reviewed: Allergy & Precautions, NPO status , Patient's Chart, lab work & pertinent test results  Airway Mallampati: III  TM Distance: >3 FB Neck ROM: Full    Dental  (+) Dental Advisory Given, Partial Upper, Partial Lower Permanent upper and lower partials:   Pulmonary former smoker,    Pulmonary exam normal breath sounds clear to auscultation       Cardiovascular hypertension, Pt. on medications Normal cardiovascular exam Rhythm:Regular Rate:Normal     Neuro/Psych negative neurological ROS     GI/Hepatic GERD  Medicated and Controlled,(+) Hepatitis -, CLiver cancer    Endo/Other  Morbid obesity  Renal/GU negative Renal ROS     Musculoskeletal negative musculoskeletal ROS (+)   Abdominal   Peds  Hematology negative hematology ROS (+)   Anesthesia Other Findings Day of surgery medications reviewed with the patient.  Reproductive/Obstetrics                             Anesthesia Physical Anesthesia Plan  ASA: III  Anesthesia Plan: MAC   Post-op Pain Management:    Induction: Intravenous  PONV Risk Score and Plan: 2 and Propofol infusion and Treatment may vary due to age or medical condition  Airway Management Planned: Nasal Cannula and Natural Airway  Additional Equipment:   Intra-op Plan:   Post-operative Plan:   Informed Consent: I have reviewed the patients History and Physical, chart, labs and discussed the procedure including the risks, benefits and alternatives for the proposed anesthesia with the patient or authorized representative who has indicated his/her understanding and acceptance.     Dental advisory given  Plan Discussed with: CRNA and Anesthesiologist  Anesthesia Plan Comments:         Anesthesia Quick Evaluation

## 2019-07-04 NOTE — H&P (Signed)
HPI: Samantha [name] is a 68 year old female with a history of hepatitis C status post treatment with SVR, history of [place] in December 2016 status post TACE and microwave ablation in March 2017, history of tubular adenoma of the colon in 2016, hyperlipidemia, hypertension, obesity who presents for outpatient screening endoscopy.  She is followed by [facility] and was last seen there in February of this year.  No GI complaint today.  No issue with jaundice, itching, increasing abdominal girth, new or worsening lower extremity edema, blood in stool or melena.  No dysphagia or odynophagia.  No issues with frequent heartburn, nausea or vomiting.  Her last upper endoscopy was performed 3 years ago in June 2017.  No varices at that time.  Past Medical History:  Diagnosis Date  . Allergy    seasonal  . Cirrhosis ([place]) 2018  . Diverticulosis   . Edema   . Elevated glucose    no longer an issue 03-18-16  . Fatty liver 2017  . GERD (gastroesophageal reflux disease)   . H/O seasonal allergies   . Hepatitis C    hepatitis c completed march 2017  . Hyperlipidemia 2011  . Hypertension   . LBP (low back pain)   . Liver cancer ([place]) 2017   IN REMISSION. WHEN IDAGNONED SIZE OF GRAPEFRUIT   . Morbid obesity ([place])   . Osteoarthritis    B knees  . Right middle lobe pulmonary nodule    resolved 2018  . Uterine fibroid   . Wears glasses     Past Surgical History:  Procedure Laterality Date  . CHOLECYSTECTOMY    . COLONOSCOPY N/A 01/09/2015   Procedure: COLONOSCOPY;  Surgeon: Jerene Bears, MD;  Location: [facility];  Service: Gastroenterology;  Laterality: N/A;  . ESOPHAGOGASTRODUODENOSCOPY (EGD) WITH PROPOFOL N/A 06/10/2016   Procedure: ESOPHAGOGASTRODUODENOSCOPY (EGD) WITH PROPOFOL;  Surgeon: Jerene Bears, MD;  Location: [facility];  Service: Gastroenterology;  Laterality: N/A;  . HYSTEROSCOPY W/D&C N/A 10/15/2018   Procedure: DILATATION AND CURETTAGE /HYSTEROSCOPY;  Surgeon: Aloha Gell, MD;  Location: [facility];  Service: Gynecology;  Laterality: N/A;  . IR GENERIC HISTORICAL  07/03/2016   IR RADIOLOGIST EVAL & MGMT 07/03/2016 [name], MD [facility]  . IR GENERIC HISTORICAL  11/18/2016   IR RADIOLOGIST EVAL & MGMT 11/18/2016 [name], MD [facility] INTERV RAD  . IR RADIOLOGIST EVAL & MGMT  04/09/2017  . IR RADIOLOGIST EVAL & MGMT  11/05/2017  . JOINT REPLACEMENT     BILATERAL KNEES  . left shoulder arthroplasty    . right knee replacement  2007  . total knee replacement L  03-13-08  . TUBAL LIGATION      (Not in an outpatient encounter)   Allergies  Allergen Reactions  . Amlodipine Swelling and Other (See Comments)    Edema whole body, especially in feet  . Benazepril Hcl Cough  . Codeine Itching    Family History  Problem Relation Age of Onset  . Hypertension Mother   . Ovarian cancer Mother   . Hypertension Father   . Cancer Father 3       prostate cancer   . Diabetes Father   . Diabetes Other   . Hypertension Other   . Colon cancer Neg Hx   . Esophageal cancer Neg Hx   . Rectal cancer Neg Hx   . Stomach cancer Neg Hx   . Breast cancer Neg Hx     Social History   Tobacco  Use  . Smoking status: Former Smoker    Packs/day: 0.25    Years: 20.00    Pack years: 5.00    Quit date: 12/30/2007    Years since quitting: 11.5  . Smokeless tobacco: Never Used  Substance Use Topics  . 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 Drug use: No    ROS: As per history of present illness, otherwise negative  BP (!) 175/110   Pulse 100   Temp 98.6 F (37 C) (Oral)   Resp 18   Ht 5\' 1"  (1.549 m)   Wt 131.1 kg   SpO2 100%   BMI 54.61 kg/m  Gen: awake, alert, NAD HEENT: anicteric, op clear CV: RRR, no mrg Pulm: CTA b/l Abd: soft, NT/ND, +BS throughout Ext: no c/c/e Neuro: nonfocal   RELEVANT LABS AND IMAGING: CBC    Component Value Date/Time   WBC  3.6 (L) 10/04/2018 1130   RBC 4.53 10/04/2018 1130   HGB 11.9 (L) 10/04/2018 1130   HCT 38.5 10/04/2018 1130   PLT 185 10/04/2018 1130   MCV 85.0 10/04/2018 1130   MCH 26.3 10/04/2018 1130   MCHC 30.9 10/04/2018 1130   RDW 14.5 10/04/2018 1130   LYMPHSABS 1.4 05/14/2018 1535   MONOABS 0.5 05/14/2018 1535   EOSABS 0.1 05/14/2018 1535   BASOSABS 0.0 05/14/2018 1535    CMP     Component Value Date/Time   NA 141 10/04/2018 1130   K 4.3 10/04/2018 1130   CL 109 10/04/2018 1130   CO2 26 10/04/2018 1130   GLUCOSE 89 10/04/2018 1130   BUN 16 10/04/2018 1130   CREATININE 0.81 10/04/2018 1130   CREATININE 0.85 05/27/2018 1531   CALCIUM 9.1 10/04/2018 1130   PROT 7.0 05/27/2018 1531   ALBUMIN 4.0 05/14/2018 1535   AST 14 05/27/2018 1531   ALT 9 05/27/2018 1531   ALKPHOS 86 05/14/2018 1535   BILITOT 0.3 05/27/2018 1531   GFRNONAA >60 10/04/2018 1130   GFRNONAA 70 05/27/2018 1531   GFRAA >60 10/04/2018 1130   GFRAA 82 05/27/2018 1531    ASSESSMENT/PLAN: 68 year old female with a history of hepatitis C status post treatment with SVR, history of [place] in December 2016 status post TACE and microwave ablation in March 2017, history of tubular adenoma of the colon in 2016, hyperlipidemia, hypertension, obesity who presents for outpatient screening endoscopy.  1. Cirrhosis --due to now treated hepatitis C.  History of [place] status post ablation.  She has very well compensated disease.  Well-preserved synthetic function. --EGD to exclude esophageal and gastric varices. --We discussed the risk, benefits and alternatives and she is agreeable and wishes to proceed

## 2019-07-04 NOTE — Anesthesia Procedure Notes (Signed)
Procedure Name: MAC Date/Time: 07/04/2019 10:14 AM Performed by: Maxwell Caul, CRNA Pre-anesthesia Checklist: Patient identified, Emergency Drugs available, Suction available, Patient being monitored and Timeout performed Oxygen Delivery Method: Nasal cannula

## 2019-07-04 NOTE — Discharge Instructions (Signed)

## 2019-07-04 NOTE — Op Note (Signed)
[facility] Patient Name: Samantha [name] Procedure Date: 07/04/2019 MRN: [id number] Attending MD: Jerene Bears , MD Date of Birth: November 12, 1951 CSN: [id number] Age: 68 Admit Type: Outpatient Procedure:                Upper GI endoscopy Indications:              Cirrhosis rule out esophageal varices, history of                            treated and cured viral hepatitis C, history of [place]                            s/p TACE, last EGD 05/2016 Providers:                Lajuan Lines. Hilarie Fredrickson, MD, Cleda Daub, RN, William Dalton, Technician Referring MD:             Evie Lacks. Plotnikov, MD Medicines:                Monitored Anesthesia Care Complications:            No immediate complications. Estimated Blood Loss:     Estimated blood loss was minimal. Procedure:                Pre-Anesthesia Assessment:                           - Prior to the procedure, a History and Physical                            was performed, and patient medications and                            allergies were reviewed. The patient's tolerance of                            previous anesthesia was also reviewed. The risks                            and benefits of the procedure and the sedation                            options and risks were discussed with the patient.                            All questions were answered, and informed consent                            was obtained. Prior Anticoagulants: The patient has                            taken no previous anticoagulant or antiplatelet  agents. ASA Grade Assessment: III - A patient with                            severe systemic disease. After reviewing the risks                            and benefits, the patient was deemed in                            satisfactory condition to undergo the procedure.                           After obtaining informed consent, the endoscope was                   passed under direct vision. Throughout the                            procedure, the patient's blood pressure, pulse, and                            oxygen saturations were monitored continuously. The                            GIF-H190 ([id number]) Olympus gastroscope was                            introduced through the mouth, and advanced to the                            second part of duodenum. The upper GI endoscopy was                            accomplished without difficulty. The patient                            tolerated the procedure well. Scope In: Scope Out: Findings:      Islands of salmon-colored mucosa were present at 38 cm (just above       Z-line at GE junction). No other visible abnormalities were present.       Biopsies were taken with a cold forceps for histology.      The exam of the esophagus was otherwise normal. No evidence of       esophageal varices.      The cardia and gastric fundus were normal on retroflexion.      Localized mild inflammation characterized by erythema was found in the       gastric antrum. Biopsies were taken with a cold forceps for histology       and Helicobacter pylori testing.      The examined duodenum was normal. Impression:               - Island of salmon-colored mucosa just proximal to                            GE junction. Biopsied to rule out Barrett's  esophagus.                           - No evidence of esophageal or gastric varices.                           - Mild gastritis. Biopsied to exclude H. Pylori                            infection.                           - Normal examined duodenum. Moderate Sedation:      N/A Recommendation:           - Patient has a contact number available for                            emergencies. The signs and symptoms of potential                            delayed complications were discussed with the                            patient. Return  to normal activities tomorrow.                            Written discharge instructions were provided to the                            patient.                           - Resume previous diet.                           - Continue present medications.                           - Await pathology results. Procedure Code(s):        --- Professional ---                           [phone], Esophagogastroduodenoscopy, flexible,                            transoral; with biopsy, single or multiple Diagnosis Code(s):        --- Professional ---                           K22.8, Other specified diseases of esophagus                           K29.70, Gastritis, unspecified, without bleeding                           K74.60, Unspecified cirrhosis of liver CPT copyright 2019 American Medical Association. All rights reserved. The codes documented in  this report are preliminary and upon coder review may  be revised to meet current compliance requirements. Jerene Bears, MD 07/04/2019 10:38:30 AM This report has been signed electronically. Number of Addenda: 0

## 2019-07-04 NOTE — Transfer of Care (Signed)
Immediate Anesthesia Transfer of Care Note  Patient: Samantha [name]  Procedure(s) Performed: ESOPHAGOGASTRODUODENOSCOPY (EGD) WITH PROPOFOL (N/A ) BIOPSY  Patient Location: PACU and Endoscopy Unit  Anesthesia Type:MAC  Level of Consciousness: awake, alert  and oriented  Airway & Oxygen Therapy: Patient Spontanous Breathing and Patient connected to nasal cannula oxygen  Post-op Assessment: Report given to RN and Post -op Vital signs reviewed and stable  Post vital signs: Reviewed and stable  Last Vitals:  Vitals Value Taken Time  BP    Temp    Pulse    Resp    SpO2      Last Pain:  Vitals:   07/04/19 0902  TempSrc: Oral  PainSc: 0-No pain         Complications: No apparent anesthesia complications

## 2019-07-04 NOTE — Anesthesia Postprocedure Evaluation (Signed)
Anesthesia Post Note  Patient: Aubriel Khanna  Procedure(s) Performed: ESOPHAGOGASTRODUODENOSCOPY (EGD) WITH PROPOFOL (N/A ) BIOPSY     Patient location during evaluation: Endoscopy Anesthesia Type: MAC Level of consciousness: awake and alert Pain management: pain level controlled Vital Signs Assessment: post-procedure vital signs reviewed and stable Respiratory status: spontaneous breathing, nonlabored ventilation, respiratory function stable and patient connected to nasal cannula oxygen Cardiovascular status: stable and blood pressure returned to baseline Postop Assessment: no apparent nausea or vomiting Anesthetic complications: no    Last Vitals:  Vitals:   07/04/19 1052 07/04/19 1102  BP: (!) 172/107 (!) 170/111  Pulse:  72  Resp: 17 13  Temp:    SpO2: 95% 100%    Last Pain:  Vitals:   07/04/19 1102  TempSrc:   PainSc: 0-No pain                 Catalina Gravel

## 2019-07-05 ENCOUNTER — Encounter: Payer: Self-pay | Admitting: Internal Medicine

## 2019-07-05 ENCOUNTER — Encounter (HOSPITAL_COMMUNITY): Payer: Self-pay | Admitting: Internal Medicine

## 2019-07-13 ENCOUNTER — Other Ambulatory Visit: Payer: Self-pay

## 2019-07-13 ENCOUNTER — Ambulatory Visit (INDEPENDENT_AMBULATORY_CARE_PROVIDER_SITE_OTHER): Payer: Medicare Other | Admitting: *Deleted

## 2019-07-13 DIAGNOSIS — Z23 Encounter for immunization: Secondary | ICD-10-CM | POA: Diagnosis not present

## 2019-07-15 ENCOUNTER — Telehealth: Payer: Self-pay | Admitting: *Deleted

## 2019-07-15 ENCOUNTER — Encounter: Payer: Self-pay | Admitting: *Deleted

## 2019-07-15 ENCOUNTER — Other Ambulatory Visit: Payer: Self-pay | Admitting: *Deleted

## 2019-07-15 LAB — TB SKIN TEST
Induration: 0 mm
TB Skin Test: NEGATIVE

## 2019-07-15 NOTE — Telephone Encounter (Signed)
Pt came back in to have TB Skin test read, which was NEGATIVE. Requesting letter for job. Created and gave to pt.Marland KitchenJohny Chess

## 2019-08-04 DIAGNOSIS — K7469 Other cirrhosis of liver: Secondary | ICD-10-CM | POA: Diagnosis not present

## 2019-08-04 DIAGNOSIS — C220 Liver cell carcinoma: Secondary | ICD-10-CM | POA: Diagnosis not present

## 2019-08-05 ENCOUNTER — Other Ambulatory Visit: Payer: Self-pay

## 2019-08-05 ENCOUNTER — Encounter: Payer: Self-pay | Admitting: Internal Medicine

## 2019-08-05 ENCOUNTER — Ambulatory Visit (INDEPENDENT_AMBULATORY_CARE_PROVIDER_SITE_OTHER): Payer: Medicare Other | Admitting: Internal Medicine

## 2019-08-05 VITALS — BP 148/76 | HR 125 | Temp 101.2°F | Resp 18 | Ht 61.0 in | Wt 297.0 lb

## 2019-08-05 DIAGNOSIS — L03311 Cellulitis of abdominal wall: Secondary | ICD-10-CM | POA: Diagnosis not present

## 2019-08-05 DIAGNOSIS — L089 Local infection of the skin and subcutaneous tissue, unspecified: Secondary | ICD-10-CM

## 2019-08-05 MED ORDER — CEFTRIAXONE SODIUM 1 G IJ SOLR
1.0000 g | Freq: Once | INTRAMUSCULAR | Status: AC
  Administered 2019-08-05: 1 g via INTRAMUSCULAR

## 2019-08-05 MED ORDER — DOXYCYCLINE HYCLATE 100 MG PO TABS: 100.0000 mg | ORAL_TABLET | Freq: Two times a day (BID) | ORAL | 0 refills | Status: DC

## 2019-08-05 MED ORDER — HYDROCODONE-ACETAMINOPHEN 5-325 MG PO TABS: 1.0000 | ORAL_TABLET | Freq: Four times a day (QID) | ORAL | 0 refills | Status: DC | PRN

## 2019-08-05 MED ORDER — AMOXICILLIN-POT CLAVULANATE 875-125 MG PO TABS: 1.0000 | ORAL_TABLET | Freq: Two times a day (BID) | ORAL | 0 refills | Status: DC

## 2019-08-05 NOTE — Progress Notes (Signed)
Subjective:    Patient ID: Samantha [name], female    DOB: 20-Feb-1951, 68 y.o.   MRN: [id number]  HPI The patient is here for an acute visit.  4 days ago she noticed a boil on her lower abdomen.  She states it broke open Monday night and has been draining pus since then.  It is red around the area and extremely painful.  She has a fever here today, but denies any known fever or chills at home.  She has not had any cold symptoms and feels she is low risk for COVID.  She has had some diarrhea, but feels it is related to the infection.  She has had some headaches.  She is never had a boil before.   Medications and allergies reviewed with patient and updated if appropriate.  Patient Active Problem List   Diagnosis Date Noted  . Irregular Z line of esophagus   . Vagina bleeding 05/14/2018  . Gastritis   . Edema 04/14/2016  . Hepatic cirrhosis ([place]) 03/03/2016  . Hepatocellular carcinoma ([place]) 12/20/2015  . Chronic hepatitis C without hepatic coma ([place]) 12/05/2015  . Liver lesion, right lobe 12/05/2015  . Elevated LFTs 08/15/2015  . Screening for colon cancer   . History of colonic polyps   . Benign neoplasm of transverse colon   . Cystitis 08/04/2013  . Well adult exam 11/30/2012  . Fatty liver 03/23/2012  . Special screening for malignant neoplasms, colon 12/03/2011  . Benign neoplasm of colon 12/03/2011  . Diverticulosis of colon (without mention of hemorrhage) 12/03/2011  . 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 Bronchitis 11/25/2011  . Neck pain, chronic 08/22/2011  . COUGH 10/23/2010  . NONSPEC ELEVATION OF LEVELS OF TRANSAMINASE/LDH 09/23/2010  . WART, VIRAL 09/20/2010  . OSTEOARTHRITIS 08/07/2009  . GERD 11/14/2008  . Low back pain 11/14/2008  . SINUSITIS, ACUTE 09/28/2008  . NEOPLASM OF UNCERTAIN BEHAVIOR OF SKIN 07/14/2008  . OBESITY, MORBID 05/04/2008  . KNEE PAIN 05/04/2008  . Hyperglycemia 05/04/2008  . VARICOSE VEINS, LOWER EXTREMITIES 04/04/2008  . Essential  hypertension 03/31/2008  . MUSCLE CRAMPS 03/31/2008  . URINARY FREQUENCY 03/31/2008    Current Outpatient Medications on File Prior to Visit  Medication Sig Dispense Refill  . aspirin EC 81 MG tablet Take 81 mg by mouth daily.    Marland Kitchen BLACK COHOSH EXTRACT PO Take 1 tablet by mouth daily.    . Cholecalciferol (EQL VITAMIN D3) 1000 UNITS tablet Take 1,000 Units by mouth daily.     . furosemide (LASIX) 20 MG tablet Take 1-2 tablets (20-40 mg total) by mouth daily. (Patient taking differently: Take 20 mg by mouth at bedtime. ) 180 tablet 3  . glucosamine-chondroitin 500-400 MG tablet Take 1 tablet by mouth daily.    Marland Kitchen losartan (COZAAR) 100 MG tablet Take 1 tablet (100 mg total) by mouth daily. 90 tablet 3  . meloxicam (MOBIC) 7.5 MG tablet TAKE 1 TO 2 TABLETS BY MOUTH ONCE DAILY AS NEEDED FOR PAIN (Patient taking differently: Take 7.5-15 mg by mouth daily as needed (pain.). ) 60 tablet 1  . Multiple Vitamin (MULTIVITAMIN WITH MINERALS) TABS tablet Take 1 tablet by mouth daily.     . naproxen sodium (ALEVE) 220 MG tablet Take 220 mg by mouth daily as needed (for pain.).    Marland Kitchen pantoprazole (PROTONIX) 40 MG tablet Take 1 tablet (40 mg total) by mouth daily. 90 tablet 3  . potassium chloride SA (K-DUR) 20 MEQ tablet Take 1 tablet (20  mEq total) by mouth daily. 90 tablet 3  . saccharomyces boulardii (FLORASTOR) 250 MG capsule Take 1 capsule (250 mg total) by mouth 2 (two) times daily. 30 capsule 0  . vitamin C (ASCORBIC ACID) 500 MG tablet Take 500 mg by mouth daily.     No current facility-administered medications on file prior to visit.     Past Medical History:  Diagnosis Date  . Allergy    seasonal  . Cirrhosis ([place]) 2018  . Diverticulosis   . Edema   . Elevated glucose    no longer an issue 03-18-16  . Fatty liver 2017  . GERD (gastroesophageal reflux disease)   . H/O seasonal allergies   . Hepatitis C    hepatitis c completed march 2017  . Hyperlipidemia 2011  . Hypertension   . LBP  (low back pain)   . Liver cancer ([place]) 2017   IN REMISSION. WHEN IDAGNONED SIZE OF GRAPEFRUIT   . Morbid obesity ([place])   . Osteoarthritis    B knees  . Right middle lobe pulmonary nodule    resolved 2018  . Uterine fibroid   . Wears glasses     Past Surgical History:  Procedure Laterality Date  . BIOPSY  07/04/2019   Procedure: BIOPSY;  Surgeon: Jerene Bears, MD;  Location: [facility];  Service: Gastroenterology;;  . CHOLECYSTECTOMY    . COLONOSCOPY N/A 01/09/2015   Procedure: COLONOSCOPY;  Surgeon: Jerene Bears, MD;  [facility];  Service: Gastroenterology;  Laterality: N/A;  . ESOPHAGOGASTRODUODENOSCOPY (EGD) WITH PROPOFOL N/A 06/10/2016   Procedure: ESOPHAGOGASTRODUODENOSCOPY (EGD) WITH PROPOFOL;  Surgeon: Jerene Bears, MD;  [facility];  Service: Gastroenterology;  Laterality: N/A;  . ESOPHAGOGASTRODUODENOSCOPY (EGD) WITH PROPOFOL N/A 07/04/2019   Procedure: ESOPHAGOGASTRODUODENOSCOPY (EGD) WITH PROPOFOL;  Surgeon: Jerene Bears, MD;  [facility];  Service: Gastroenterology;  Laterality: N/A;  . HYSTEROSCOPY W/D&C N/A 10/15/2018   Procedure: DILATATION AND CURETTAGE /HYSTEROSCOPY;  Surgeon: Aloha Gell, MD;  Location: [facility];  Service: Gynecology;  Laterality: N/A;  . IR GENERIC HISTORICAL  07/03/2016   IR RADIOLOGIST EVAL & MGMT 07/03/2016 [name], MD [facility] INTER[facility]  . IR GENERIC HISTORICAL  11/18/2016   IR RADIOLOGIST EVAL & MGMT 11/18/2016 [name], MD [facility]  . IR RADIOLOGIST EVAL & MGMT  04/09/2017  . IR RADIOLOGIST EVAL & MGMT  11/05/2017  . JOINT REPLACEMENT     BILATERAL KNEES  . left shoulder arthroplasty    . right knee replacement  2007  . total knee replacement L  03-13-08  . TUBAL LIGATION      Social History   Socioeconomic History  . Marital status: Single    Spouse name: Not on file  . Number of children: 4  . Years of education: Not on file  . Highest education level: Not on file   Occupational History  . Not on file  Social Needs  . Financial resource strain: Not hard at all  . Food insecurity    Worry: Never true    Inability: Never true  . Transportation needs    Medical: No    Non-medical: No  Tobacco Use  . Smoking status: Former Smoker    Packs/day: 0.25    Years: 20.00    Pack years: 5.00    Quit date: 12/30/2007    Years since quitting: 11.6  . Smokeless tobacco: Never Used  Substance and Sexual Activity  . 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 Drug use: No  . Sexual activity: Not Currently    Birth control/protection: Post-menopausal  Lifestyle  . Physical activity    Days per week: 3 days    Minutes per session: 30 min  . Stress: Not at all  Relationships  . Social connections    Talks on phone: More than three times a week    Gets together: More than three times a week    Attends religious service: More than 4 times per year    Active member of club or organization: Yes    Attends meetings of clubs or organizations: More than 4 times per year    Relationship status: Not on file  Other Topics Concern  . Not on file  Social History Narrative   Regular exercise: walk a few times a week   Caffeine use: none   Single, raising her young great granddaughter and watches grandchildren after school   Has a room mate to help prn   Retired from nursing tech--worked at [employer]   Independent/drives    Family History  Problem Relation Age of Onset  . Hypertension Mother   . Ovarian cancer Mother   . Hypertension Father   . Cancer Father 5       prostate cancer   . Diabetes Father   . Diabetes Other   . Hypertension Other   . Colon cancer Neg Hx   . Esophageal cancer Neg Hx   . Rectal cancer Neg Hx   . Stomach cancer Neg Hx   . Breast cancer Neg Hx     Review of Systems  Constitutional: Negative for chills and fever.  Respiratory: Negative for  cough, shortness of breath and wheezing.   Gastrointestinal: Positive for diarrhea. Negative for abdominal pain.  Neurological: Positive for light-headedness. Negative for headaches.       Objective:   Vitals:   08/05/19 1435  BP: (!) 148/76  Pulse: (!) 125  Resp: 18  Temp: (!) 101.2 F (38.4 C)  SpO2: 95%   BP Readings from Last 3 Encounters:  08/05/19 (!) 148/76  07/04/19 (!) 170/111  06/13/19 136/84   Wt Readings from Last 3 Encounters:  08/05/19 297 lb (134.7 kg)  07/04/19 289 lb (131.1 kg)  06/13/19 294 lb (133.4 kg)   Body mass index is 56.12 kg/m.   Physical Exam Constitutional:      General: She is in acute distress (Mild distress related to pain from the boil).     Appearance: Normal appearance. She is not ill-appearing.  HENT:     Head: Normocephalic and atraumatic.  Skin:    Findings: Erythema present.     Comments: Open ulcer/boil right lower abdomen with minimal pus drainage, immediate area around the boil is very painful and indurated.  The surrounding erythema covering 50% of her abdomen-the size of a medium sized watermelon-warm to touch and mildly tender.  Neurological:     Mental Status: She is alert.            Assessment & Plan:    See Problem List for Assessment and Plan of chronic medical problems.

## 2019-08-05 NOTE — Patient Instructions (Signed)
You received an antibiotic by injection today.   Take the two antibiotics as prescribed and complete them both. Start them tonight.  Take the pain medication as needed.     If your symptoms are not getting better or get any worse over the next 24 hours you need to go to the ED for IV antibiotics.

## 2019-08-05 NOTE — Assessment & Plan Note (Signed)
Significant cellulitis of abdominal wall related to infected boil right lower abdomen She is febrile here and in significant pain Discussed that she does have a significant infection and that we will treat as an outpatient, but she needs to have a low threshold for going to the emergency room over the weekend if her symptoms worsen or not improving Ceftriaxone 2 g IM x1 Vicodin as needed for pain Doxycycline 100 mg twice daily x10 days plus Augmentin twice daily x10 days Advised to call with any questions or concerns over the weekend or next week

## 2019-08-08 ENCOUNTER — Other Ambulatory Visit: Payer: Self-pay | Admitting: Internal Medicine

## 2019-08-09 ENCOUNTER — Other Ambulatory Visit: Payer: Self-pay

## 2019-08-09 ENCOUNTER — Encounter: Payer: Self-pay | Admitting: Internal Medicine

## 2019-08-09 ENCOUNTER — Ambulatory Visit (INDEPENDENT_AMBULATORY_CARE_PROVIDER_SITE_OTHER): Payer: Medicare Other | Admitting: Internal Medicine

## 2019-08-09 ENCOUNTER — Other Ambulatory Visit: Payer: Medicare Other

## 2019-08-09 VITALS — BP 118/70 | HR 94 | Temp 98.4°F | Resp 16 | Ht 61.0 in | Wt 296.0 lb

## 2019-08-09 DIAGNOSIS — L02211 Cutaneous abscess of abdominal wall: Secondary | ICD-10-CM

## 2019-08-09 MED ORDER — RIFAMPIN 150 MG PO CAPS: 150.0000 mg | ORAL_CAPSULE | Freq: Two times a day (BID) | ORAL | 0 refills | Status: DC

## 2019-08-09 MED ORDER — PROMETHAZINE HCL 12.5 MG PO TABS: 12.5000 mg | ORAL_TABLET | Freq: Four times a day (QID) | ORAL | 0 refills | Status: DC | PRN

## 2019-08-09 MED ORDER — OXYCODONE HCL 5 MG PO TABS: 5.0000 mg | ORAL_TABLET | Freq: Four times a day (QID) | ORAL | 0 refills | Status: AC | PRN

## 2019-08-09 MED ORDER — SULFAMETHOXAZOLE-TRIMETHOPRIM 800-160 MG PO TABS: 1.0000 | ORAL_TABLET | Freq: Two times a day (BID) | ORAL | 0 refills | Status: DC

## 2019-08-09 NOTE — Patient Instructions (Signed)
Incision and Drainage Incision and drainage is a surgical procedure to open and drain a fluid-filled sac. The sac may be filled with pus, mucus, or blood. Examples of fluid-filled sacs that may need surgical drainage include cysts, skin infections (abscesses), and red lumps that develop from a ruptured cyst or a small abscess (boils). You may need this procedure if the affected area is large, painful, infected, or not healing well. Tell a health care provider about:  Any allergies you have.  All medicines you are taking, including vitamins, herbs, eye drops, creams, and over-the-counter medicines.  Any problems you or family members have had with anesthetic medicines.  Any blood disorders you have or have had.  Any surgeries you have had.  Any medical conditions you have or have had.  Whether you are pregnant or may be pregnant. What are the risks? Generally, this is a safe procedure. However, problems may occur, including:  Infection.  Bleeding.  Allergic reactions to medicines.  Scarring.  The cyst or abscess returns.  Damage to nerves or vessels. What happens before the procedure? Medicine Ask your health care provider about:  Changing or stopping your regular medicines. This is especially important if you are taking diabetes medicines or blood thinners.  Taking medicines such as aspirin and ibuprofen. These medicines can thin your blood. Do not take these medicines unless your health care provider tells you to take them.  Taking over-the-counter medicines, vitamins, herbs, and supplements. Tests You may have an exam or testing. These may include:  Ultrasound or other imaging tests to see how large or deep the fluid-filled sac is.  Blood tests to check for infection. General instructions  Follow instructions from your health care provider about eating or drinking restrictions.  Plan to have someone take you home from the hospital or clinic.  Ask your health care  provider whether a responsible adult should care for you for at least 24 hours after you leave the hospital or clinic. This is important.  You may get a tetanus shot.  Ask your health care provider: ? How your surgery site will be marked or identified. ? What steps will be taken to help prevent infection. These may include:  Removing hair at the surgery site.  Washing skin with a germ-killing soap.  Receiving antibiotic medicine. What happens during the procedure?   An IV may be inserted into one of your veins.  You will be given one or more of the following: ? A medicine to help you relax (sedative). ? A medicine to numb the area (local anesthetic). ? A medicine to make you fall asleep (general anesthetic).  An incision will be made in the top of the fluid-filled sac.  Pus, blood, and mucus will be squeezed out, and a syringe or tube (drain) may be used to empty more fluid from the sac.  Your health care provider will do one of the following. He or she may: ? Leave the drain in place for several weeks to drain more fluid. ? Stitch open the edges of the incision to make a long-term opening for drainage (marsupialization).  The inside of the sac may be washed out (irrigated) with a sterile solution and packed with gauze before it is covered with a bandage (dressing).  Your health care provider do a culture test of the drainage fluid. The procedure may vary among health care providers and hospitals. What happens after the procedure?  Your blood pressure, heart rate, breathing rate, and blood oxygen level   will be monitored often until you leave the hospital or clinic.  Do not drive for 24 hours if you were given a sedative during your procedure. Summary  Incision and drainage is a surgical procedure to open and drain a fluid-filled sac. The sac may be filled with pus, mucus, or blood.  Before the procedure, you may be given antibiotic medicine to treat or help prevent  infection.  During the procedure, an incision will be made in the top of the fluid-filled sac. Pus, blood, and mucus is squeezed out, and a syringe or tube (drain) may be used to empty more fluid from the sac.  The inside of the sac may be washed out (irrigated) with a sterile solution and packed with gauze before it is covered with a bandage (dressing). This information is not intended to replace advice given to you by your health care provider. Make sure you discuss any questions you have with your health care provider. Document Released: 06/10/2001 Document Revised: 11/15/2018 Document Reviewed: 11/15/2018 Elsevier Patient Education  2020 [employer]  

## 2019-08-09 NOTE — Progress Notes (Signed)
Subjective:  Patient ID: Samantha [name], female    DOB: 20-Mar-1951  Age: 68 y.o. MRN: [id number]  CC: Abscess   HPI Samantha [name] presents for concerns about a boil on her right lower abdomen that developed about a week ago.  She was seen by another provider about 4 days ago and was started on Augmentin and doxycycline.  There has been no incision and drainage.  She tells me the area is worsening with pain, redness, swelling, and purulent discharge.  Outpatient Medications Prior to Visit  Medication Sig Dispense Refill  . aspirin EC 81 MG tablet Take 81 mg by mouth daily.    Marland Kitchen BLACK COHOSH EXTRACT PO Take 1 tablet by mouth daily.    . Cholecalciferol (EQL VITAMIN D3) 1000 UNITS tablet Take 1,000 Units by mouth daily.     . furosemide (LASIX) 20 MG tablet Take 1-2 tablets (20-40 mg total) by mouth daily. (Patient taking differently: Take 20 mg by mouth at bedtime. ) 180 tablet 3  . glucosamine-chondroitin 500-400 MG tablet Take 1 tablet by mouth daily.    Marland Kitchen loratadine (CLARITIN) 10 MG tablet Take 1 tablet by mouth once daily 90 tablet 1  . losartan (COZAAR) 100 MG tablet Take 1 tablet (100 mg total) by mouth daily. 90 tablet 3  . meloxicam (MOBIC) 7.5 MG tablet TAKE 1 TO 2 TABLETS BY MOUTH ONCE DAILY AS NEEDED FOR PAIN (Patient taking differently: Take 7.5-15 mg by mouth daily as needed (pain.). ) 60 tablet 1  . Multiple Vitamin (MULTIVITAMIN WITH MINERALS) TABS tablet Take 1 tablet by mouth daily.     . naproxen sodium (ALEVE) 220 MG tablet Take 220 mg by mouth daily as needed (for pain.).    Marland Kitchen pantoprazole (PROTONIX) 40 MG tablet Take 1 tablet (40 mg total) by mouth daily. 90 tablet 3  . potassium chloride SA (K-DUR) 20 MEQ tablet Take 1 tablet (20 mEq total) by mouth daily. 90 tablet 3  . saccharomyces boulardii (FLORASTOR) 250 MG capsule Take 1 capsule (250 mg total) by mouth 2 (two) times daily. 30 capsule 0  . vitamin C (ASCORBIC ACID) 500 MG tablet Take 500 mg by  mouth daily.    Marland Kitchen amoxicillin-clavulanate (AUGMENTIN) 875-125 MG tablet Take 1 tablet by mouth 2 (two) times daily. 20 tablet 0  . doxycycline (VIBRA-TABS) 100 MG tablet Take 1 tablet (100 mg total) by mouth 2 (two) times daily. 20 tablet 0  . HYDROcodone-acetaminophen (NORCO/VICODIN) 5-325 MG tablet Take 1-2 tablets by mouth every 6 (six) hours as needed for moderate pain. 30 tablet 0   No facility-administered medications prior to visit.     ROS Review of Systems  Constitutional: Negative for chills, fatigue and fever.  HENT: Negative.   Eyes: Negative.   Respiratory: Negative for cough, shortness of breath and wheezing.   Cardiovascular: Negative for chest pain, palpitations and leg swelling.  Gastrointestinal: Negative for abdominal pain, diarrhea and nausea.  Endocrine: Negative.   Genitourinary: Negative.  Negative for difficulty urinating.  Musculoskeletal: Negative.   Skin: Positive for color change.  Neurological: Negative for dizziness.  Hematological: Negative for adenopathy. Does not bruise/bleed easily.  Psychiatric/Behavioral: Negative.     Objective:  BP 118/70 (BP Location: Left Arm, Patient Position: Sitting, Cuff Size: Large)   Pulse 94   Temp 98.4 F (36.9 C) (Oral)   Resp 16   Ht 5\' 1"  (1.549 m)   Wt 296 lb (134.3 kg)   SpO2 96%  BMI 55.93 kg/m   BP Readings from Last 3 Encounters:  08/11/19 128/70  08/09/19 118/70  08/05/19 (!) 148/76    Wt Readings from Last 3 Encounters:  08/11/19 297 lb (134.7 kg)  08/09/19 296 lb (134.3 kg)  08/05/19 297 lb (134.7 kg)    Physical Exam Vitals signs reviewed.  Constitutional:      General: She is not in acute distress.    Appearance: She is obese. She is not ill-appearing, toxic-appearing or diaphoretic.  HENT:     Mouth/Throat:     Mouth: Mucous membranes are moist.  Eyes:     General: No scleral icterus.    Conjunctiva/sclera: Conjunctivae normal.  Neck:     Musculoskeletal: Normal range of  motion.  Cardiovascular:     Rate and Rhythm: Normal rate and regular rhythm.  Pulmonary:     Effort: Pulmonary effort is normal. No respiratory distress.     Breath sounds: No wheezing or rales.  Abdominal:     General: Abdomen is protuberant. Bowel sounds are normal.     Palpations: There is no hepatomegaly or splenomegaly.    Skin:    General: Skin is warm.     Findings: No lesion.  Neurological:     General: No focal deficit present.     Mental Status: She is alert.  Psychiatric:        Mood and Affect: Mood normal.        Behavior: Behavior normal.     Lab Results  Component Value Date   WBC 3.6 (L) 10/04/2018   HGB 11.9 (L) 10/04/2018   HCT 38.5 10/04/2018   PLT 185 10/04/2018   GLUCOSE 89 10/04/2018   CHOL 154 10/20/2017   TRIG 132.0 10/20/2017   HDL 44.80 10/20/2017   LDLCALC 82 10/20/2017   ALT 9 05/27/2018   AST 14 05/27/2018   NA 141 10/04/2018   K 4.3 10/04/2018   CL 109 10/04/2018   CREATININE 0.81 10/04/2018   BUN 16 10/04/2018   CO2 26 10/04/2018   TSH 1.91 10/20/2017   INR 1.0 05/27/2018   HGBA1C 5.8 08/14/2015    After informed verbal consent was obtained, using Betadine for cleansing and 1% Lidocaine with epinephrine for anesthetic, 8 cc's were used. With sterile technique a 6 mm punch biopsy was used to open the area.  The cavity was explored with hydrogen peroxide and Q-tips.  Several areas of loculations and tracking were identified.  Some extended up to 4 cm.  The cavity was then packed with iodoform.  Hemostasis was obtained by pressure dressing. Wound care instructions provided. Be alert for any signs of cutaneous infection. Clx was sent. The procedure was well tolerated without complications.  Assessment & Plan:   Samantha [name] was seen today for abscess.  Diagnoses and all orders for this visit:  Abscess of skin of abdomen- Incision and drainage has been completed.  She has not responded well to Augmentin and doxycycline.  I am concerned this  is MRSA.  I have asked her to change to Bactrim and rifampin.  She has cirrhosis so I have adjusted the doses for hepatic impairment.  Will control the pain with oxycodone.  In the event that she develops nausea and vomiting I recommended promethazine as needed.  I have asked her to leave the iodoform packing in place and to return in 48 to 72 hours for packing removal and reevaluation. -     WOUND CULTURE; Future -  sulfamethoxazole-trimethoprim (BACTRIM DS) 800-160 MG tablet; Take 1 tablet by mouth 2 (two) times daily for 7 days. -     rifampin (RIFADIN) 150 MG capsule; Take 1 capsule (150 mg total) by mouth 2 (two) times daily for 7 days. -     oxyCODONE (OXY IR/ROXICODONE) 5 MG immediate release tablet; Take 1 tablet (5 mg total) by mouth every 6 (six) hours as needed for up to 5 days for severe pain. -     promethazine (PHENERGAN) 12.5 MG tablet; Take 1 tablet (12.5 mg total) by mouth every 6 (six) hours as needed for nausea or vomiting.   I have discontinued Rowyn Mustapha. Tibbs's doxycycline, amoxicillin-clavulanate, and HYDROcodone-acetaminophen. I am also having her start on sulfamethoxazole-trimethoprim, rifampin, oxyCODONE, and promethazine. Additionally, I am having her maintain her Cholecalciferol, multivitamin with minerals, aspirin EC, glucosamine-chondroitin, vitamin C, saccharomyces boulardii, furosemide, losartan, pantoprazole, potassium chloride SA, meloxicam, BLACK COHOSH EXTRACT PO, naproxen sodium, and loratadine.  Meds ordered this encounter  Medications  . sulfamethoxazole-trimethoprim (BACTRIM DS) 800-160 MG tablet    Sig: Take 1 tablet by mouth 2 (two) times daily for 7 days.    Dispense:  14 tablet    Refill:  0  . rifampin (RIFADIN) 150 MG capsule    Sig: Take 1 capsule (150 mg total) by mouth 2 (two) times daily for 7 days.    Dispense:  14 capsule    Refill:  0  . oxyCODONE (OXY IR/ROXICODONE) 5 MG immediate release tablet    Sig: Take 1 tablet (5 mg total) by mouth  every 6 (six) hours as needed for up to 5 days for severe pain.    Dispense:  20 tablet    Refill:  0  . promethazine (PHENERGAN) 12.5 MG tablet    Sig: Take 1 tablet (12.5 mg total) by mouth every 6 (six) hours as needed for nausea or vomiting.    Dispense:  30 tablet    Refill:  0     Follow-up: Return in about 2 days (around 08/11/2019).  Scarlette Calico, MD

## 2019-08-10 ENCOUNTER — Other Ambulatory Visit: Payer: Self-pay | Admitting: Nurse Practitioner

## 2019-08-10 DIAGNOSIS — C220 Liver cell carcinoma: Secondary | ICD-10-CM

## 2019-08-11 ENCOUNTER — Encounter: Payer: Self-pay | Admitting: Internal Medicine

## 2019-08-11 ENCOUNTER — Other Ambulatory Visit: Payer: Self-pay

## 2019-08-11 ENCOUNTER — Ambulatory Visit (INDEPENDENT_AMBULATORY_CARE_PROVIDER_SITE_OTHER): Payer: Medicare Other | Admitting: Internal Medicine

## 2019-08-11 VITALS — BP 128/70 | HR 101 | Temp 98.6°F | Resp 16 | Ht 61.0 in | Wt 297.0 lb

## 2019-08-11 DIAGNOSIS — L02211 Cutaneous abscess of abdominal wall: Secondary | ICD-10-CM

## 2019-08-11 NOTE — Patient Instructions (Signed)
Skin Abscess  A skin abscess is an infected area on or under your skin that contains a collection of pus and other material. An abscess may also be called a furuncle, carbuncle, or boil. An abscess can occur in or on almost any part of your body. Some abscesses break open (rupture) on their own. Most continue to get worse unless they are treated. The infection can spread deeper into the body and eventually into your blood, which can make you feel ill. Treatment usually involves draining the abscess. What are the causes? An abscess occurs when germs, like bacteria, pass through your skin and cause an infection. This may be caused by:  A scrape or cut on your skin.  A puncture wound through your skin, including a needle injection or insect bite.  Blocked oil or sweat glands.  Blocked and infected hair follicles.  A cyst that forms beneath your skin (sebaceous cyst) and becomes infected. What increases the risk? This condition is more likely to develop in people who:  Have a weak body defense system (immune system).  Have diabetes.  Have dry and irritated skin.  Get frequent injections or use illegal IV drugs.  Have a foreign body in a wound, such as a splinter.  Have problems with their lymph system or veins. What are the signs or symptoms? Symptoms of this condition include:  A painful, firm bump under the skin.  A bump with pus at the top. This may break through the skin and drain. Other symptoms include:  Redness surrounding the abscess site.  Warmth.  Swelling of the lymph nodes (glands) near the abscess.  Tenderness.  A sore on the skin. How is this diagnosed? This condition may be diagnosed based on:  A physical exam.  Your medical history.  A sample of pus. This may be used to find out what is causing the infection.  Blood tests.  Imaging tests, such as an ultrasound, CT scan, or MRI. How is this treated? A small abscess that drains on its own may not  need treatment. Treatment for larger abscesses may include:  Moist heat or heat pack applied to the area several times a day.  A procedure to drain the abscess (incision and drainage).  Antibiotic medicines. For a severe abscess, you may first get antibiotics through an IV and then change to antibiotics by mouth. Follow these instructions at home: Medicines   Take over-the-counter and prescription medicines only as told by your health care provider.  If you were prescribed an antibiotic medicine, take it as told by your health care provider. Do not stop taking the antibiotic even if you start to feel better. Abscess care   If you have an abscess that has not drained, apply heat to the affected area. Use the heat source that your health care provider recommends, such as a moist heat pack or a heating pad. ? Place a towel between your skin and the heat source. ? Leave the heat on for 20-30 minutes. ? Remove the heat if your skin turns bright red. This is especially important if you are unable to feel pain, heat, or cold. You may have a greater risk of getting burned.  Follow instructions from your health care provider about how to take care of your abscess. Make sure you: ? Cover the abscess with a bandage (dressing). ? Change your dressing or gauze as told by your health care provider. ? Wash your hands with soap and water before you change the   dressing or gauze. If soap and water are not available, use hand sanitizer.  Check your abscess every day for signs of a worsening infection. Check for: ? More redness, swelling, or pain. ? More fluid or blood. ? Warmth. ? More pus or a bad smell. General instructions  To avoid spreading the infection: ? Do not share personal care items, towels, or hot tubs with others. ? Avoid making skin contact with other people.  Keep all follow-up visits as told by your health care provider. This is important. Contact a health care provider if you  have:  More redness, swelling, or pain around your abscess.  More fluid or blood coming from your abscess.  Warm skin around your abscess.  More pus or a bad smell coming from your abscess.  A fever.  Muscle aches.  Chills or a general ill feeling. Get help right away if you:  Have severe pain.  See red streaks on your skin spreading away from the abscess. Summary  A skin abscess is an infected area on or under your skin that contains a collection of pus and other material.  A small abscess that drains on its own may not need treatment.  Treatment for larger abscesses may include having a procedure to drain the abscess and taking an antibiotic. This information is not intended to replace advice given to you by your health care provider. Make sure you discuss any questions you have with your health care provider. Document Released: 09/24/2005 Document Revised: 04/07/2019 Document Reviewed: 01/28/2018 Elsevier Patient Education  2020 [employer]  

## 2019-08-11 NOTE — Progress Notes (Signed)
Subjective:  Patient ID: Samantha [name], female    DOB: 1951/05/18  Age: 68 y.o. MRN: [id number]  CC: Recurrent Skin Infections   HPI Samantha [name] presents for f/up - She is 2 days status post incision and drainage of an abscess on her right lower quadrant.  She reports about a 50% improvement in pain, redness, and swelling.  She is tolerating the antibiotics well with no rash, nausea, or vomiting.  She denies fever or chills.  Outpatient Medications Prior to Visit  Medication Sig Dispense Refill  . aspirin EC 81 MG tablet Take 81 mg by mouth daily.    Marland Kitchen BLACK COHOSH EXTRACT PO Take 1 tablet by mouth daily.    . Cholecalciferol (EQL VITAMIN D3) 1000 UNITS tablet Take 1,000 Units by mouth daily.     . furosemide (LASIX) 20 MG tablet Take 1-2 tablets (20-40 mg total) by mouth daily. (Patient taking differently: Take 20 mg by mouth at bedtime. ) 180 tablet 3  . glucosamine-chondroitin 500-400 MG tablet Take 1 tablet by mouth daily.    Marland Kitchen loratadine (CLARITIN) 10 MG tablet Take 1 tablet by mouth once daily 90 tablet 1  . losartan (COZAAR) 100 MG tablet Take 1 tablet (100 mg total) by mouth daily. 90 tablet 3  . meloxicam (MOBIC) 7.5 MG tablet TAKE 1 TO 2 TABLETS BY MOUTH ONCE DAILY AS NEEDED FOR PAIN (Patient taking differently: Take 7.5-15 mg by mouth daily as needed (pain.). ) 60 tablet 1  . Multiple Vitamin (MULTIVITAMIN WITH MINERALS) TABS tablet Take 1 tablet by mouth daily.     . naproxen sodium (ALEVE) 220 MG tablet Take 220 mg by mouth daily as needed (for pain.).    Marland Kitchen oxyCODONE (OXY IR/ROXICODONE) 5 MG immediate release tablet Take 1 tablet (5 mg total) by mouth every 6 (six) hours as needed for up to 5 days for severe pain. 20 tablet 0  . pantoprazole (PROTONIX) 40 MG tablet Take 1 tablet (40 mg total) by mouth daily. 90 tablet 3  . potassium chloride SA (K-DUR) 20 MEQ tablet Take 1 tablet (20 mEq total) by mouth daily. 90 tablet 3  . promethazine (PHENERGAN) 12.5 MG  tablet Take 1 tablet (12.5 mg total) by mouth every 6 (six) hours as needed for nausea or vomiting. 30 tablet 0  . rifampin (RIFADIN) 150 MG capsule Take 1 capsule (150 mg total) by mouth 2 (two) times daily for 7 days. 14 capsule 0  . saccharomyces boulardii (FLORASTOR) 250 MG capsule Take 1 capsule (250 mg total) by mouth 2 (two) times daily. 30 capsule 0  . vitamin C (ASCORBIC ACID) 500 MG tablet Take 500 mg by mouth daily.    Marland Kitchen sulfamethoxazole-trimethoprim (BACTRIM DS) 800-160 MG tablet Take 1 tablet by mouth 2 (two) times daily for 7 days. 14 tablet 0   No facility-administered medications prior to visit.     ROS Review of Systems  Constitutional: Negative.  Negative for chills and fever.  HENT: Negative.   Eyes: Negative for visual disturbance.  Respiratory: Negative for cough, chest tightness, shortness of breath and wheezing.   Cardiovascular: Negative for leg swelling.  Gastrointestinal: Negative for abdominal pain, diarrhea, nausea and vomiting.  Endocrine: Negative.   Genitourinary: Negative.  Negative for difficulty urinating, dysuria and hematuria.  Musculoskeletal: Negative.   Skin: Positive for color change and wound. Negative for pallor and rash.  Neurological: Negative.  Negative for dizziness and light-headedness.  Hematological: Negative for adenopathy. Does  not bruise/bleed easily.  Psychiatric/Behavioral: Negative.     Objective:  BP 128/70 (BP Location: Left Arm, Patient Position: Sitting, Cuff Size: Large)   Pulse (!) 101   Temp 98.6 F (37 C) (Oral)   Resp 16   Ht 5\' 1"  (1.549 m)   Wt 297 lb (134.7 kg)   SpO2 96%   BMI 56.12 kg/m   BP Readings from Last 3 Encounters:  08/11/19 128/70  08/09/19 118/70  08/05/19 (!) 148/76    Wt Readings from Last 3 Encounters:  08/11/19 297 lb (134.7 kg)  08/09/19 296 lb (134.3 kg)  08/05/19 297 lb (134.7 kg)    Physical Exam Vitals signs reviewed.  Constitutional:      General: She is not in acute  distress.    Appearance: She is not ill-appearing, toxic-appearing or diaphoretic.  HENT:     Nose: Nose normal.  Eyes:     General: No scleral icterus.    Conjunctiva/sclera: Conjunctivae normal.  Neck:     Musculoskeletal: Normal range of motion. No neck rigidity.  Cardiovascular:     Rate and Rhythm: Normal rate and regular rhythm.     Heart sounds: No murmur.  Pulmonary:     Effort: Pulmonary effort is normal. No respiratory distress.     Breath sounds: No stridor. No wheezing, rhonchi or rales.  Abdominal:     General: Abdomen is protuberant. Bowel sounds are normal. There is no distension.     Palpations: Abdomen is soft.       Comments: There is additional exudate expressed today.  I want the wound to stay open for another 3 to 5 days to allow ongoing drainage of the infection.  I therefore repacked the abscess cavity with iodoform.  Musculoskeletal: Normal range of motion.     Right lower leg: No edema.     Left lower leg: No edema.  Skin:    Findings: Erythema present.     Lab Results  Component Value Date   WBC 3.6 (L) 10/04/2018   HGB 11.9 (L) 10/04/2018   HCT 38.5 10/04/2018   PLT 185 10/04/2018   GLUCOSE 89 10/04/2018   CHOL 154 10/20/2017   TRIG 132.0 10/20/2017   HDL 44.80 10/20/2017   LDLCALC 82 10/20/2017   ALT 9 05/27/2018   AST 14 05/27/2018   NA 141 10/04/2018   K 4.3 10/04/2018   CL 109 10/04/2018   CREATININE 0.81 10/04/2018   BUN 16 10/04/2018   CO2 26 10/04/2018   TSH 1.91 10/20/2017   INR 1.0 05/27/2018   HGBA1C 5.8 08/14/2015    No results found.  Assessment & Plan:   Annalise was seen today for recurrent skin infections.  Diagnoses and all orders for this visit:  Abscess of skin of abdomen- There has been some improvement.  The culture and sensitivity returns and this is a MRSA infection that is resistant to sulfamethoxazole trimethoprim.  I have asked her to stop taking this and to upgrade to clindamycin which it is sensitive to.   I have asked her to stay on Rifadin.  I have asked her to remove the packing in 48 to 72 hours.  She will let me know during this period how she is doing.  I would like to recheck her in the office in about 5 days.  She will let me know if she needs to be seen sooner. -     clindamycin (CLEOCIN) 150 MG capsule; Take 1 capsule (150 mg total)  by mouth 3 (three) times daily for 7 days.   I have discontinued [name]. Coward's sulfamethoxazole-trimethoprim. I am also having her start on clindamycin. Additionally, I am having her maintain her Cholecalciferol, multivitamin with minerals, aspirin EC, glucosamine-chondroitin, vitamin C, saccharomyces boulardii, furosemide, losartan, pantoprazole, potassium chloride SA, meloxicam, BLACK COHOSH EXTRACT PO, naproxen sodium, loratadine, rifampin, oxyCODONE, and promethazine.  Meds ordered this encounter  Medications  . clindamycin (CLEOCIN) 150 MG capsule    Sig: Take 1 capsule (150 mg total) by mouth 3 (three) times daily for 7 days.    Dispense:  21 capsule    Refill:  0     Follow-up: Return in about 5 days (around 08/16/2019).  Scarlette Calico, MD

## 2019-08-12 ENCOUNTER — Encounter: Payer: Self-pay | Admitting: Internal Medicine

## 2019-08-12 LAB — WOUND CULTURE
MICRO NUMBER:: 758809
SPECIMEN QUALITY:: ADEQUATE

## 2019-08-12 MED ORDER — CLINDAMYCIN HCL 150 MG PO CAPS: 150.0000 mg | ORAL_CAPSULE | Freq: Three times a day (TID) | ORAL | 0 refills | Status: DC

## 2019-08-16 ENCOUNTER — Inpatient Hospital Stay (HOSPITAL_COMMUNITY)
Admission: EM | Admit: 2019-08-16 | Discharge: 2019-08-19 | DRG: 603 | Disposition: A | Discharge status: Home / Self Care | Payer: Medicare Other | Attending: Internal Medicine | Admitting: Internal Medicine

## 2019-08-16 ENCOUNTER — Inpatient Hospital Stay (HOSPITAL_COMMUNITY): Payer: Medicare Other

## 2019-08-16 ENCOUNTER — Other Ambulatory Visit: Payer: Self-pay

## 2019-08-16 ENCOUNTER — Encounter (HOSPITAL_COMMUNITY): Payer: Self-pay | Admitting: Emergency Medicine

## 2019-08-16 DIAGNOSIS — Z888 Allergy status to other drugs, medicaments and biological substances status: Secondary | ICD-10-CM

## 2019-08-16 DIAGNOSIS — Z791 Long term (current) use of non-steroidal anti-inflammatories (NSAID): Secondary | ICD-10-CM | POA: Diagnosis not present

## 2019-08-16 DIAGNOSIS — Z96612 Presence of left artificial shoulder joint: Secondary | ICD-10-CM | POA: Diagnosis not present

## 2019-08-16 DIAGNOSIS — L0390 Cellulitis, unspecified: Secondary | ICD-10-CM | POA: Diagnosis not present

## 2019-08-16 DIAGNOSIS — Z6841 Body Mass Index (BMI) 40.0 and over, adult: Secondary | ICD-10-CM

## 2019-08-16 DIAGNOSIS — L02219 Cutaneous abscess of trunk, unspecified: Secondary | ICD-10-CM | POA: Diagnosis not present

## 2019-08-16 DIAGNOSIS — Z833 Family history of diabetes mellitus: Secondary | ICD-10-CM | POA: Diagnosis not present

## 2019-08-16 DIAGNOSIS — B9562 Methicillin resistant Staphylococcus aureus infection as the cause of diseases classified elsewhere: Secondary | ICD-10-CM | POA: Diagnosis present

## 2019-08-16 DIAGNOSIS — L03311 Cellulitis of abdominal wall: Secondary | ICD-10-CM | POA: Diagnosis not present

## 2019-08-16 DIAGNOSIS — E785 Hyperlipidemia, unspecified: Secondary | ICD-10-CM | POA: Diagnosis present

## 2019-08-16 DIAGNOSIS — Z8619 Personal history of other infectious and parasitic diseases: Secondary | ICD-10-CM

## 2019-08-16 DIAGNOSIS — Z03818 Encounter for observation for suspected exposure to other biological agents ruled out: Secondary | ICD-10-CM | POA: Diagnosis not present

## 2019-08-16 DIAGNOSIS — K219 Gastro-esophageal reflux disease without esophagitis: Secondary | ICD-10-CM | POA: Diagnosis present

## 2019-08-16 DIAGNOSIS — L03319 Cellulitis of trunk, unspecified: Secondary | ICD-10-CM | POA: Diagnosis not present

## 2019-08-16 DIAGNOSIS — Z96653 Presence of artificial knee joint, bilateral: Secondary | ICD-10-CM | POA: Diagnosis present

## 2019-08-16 DIAGNOSIS — Z7982 Long term (current) use of aspirin: Secondary | ICD-10-CM | POA: Diagnosis not present

## 2019-08-16 DIAGNOSIS — Z8249 Family history of ischemic heart disease and other diseases of the circulatory system: Secondary | ICD-10-CM

## 2019-08-16 DIAGNOSIS — A4902 Methicillin resistant Staphylococcus aureus infection, unspecified site: Secondary | ICD-10-CM | POA: Diagnosis not present

## 2019-08-16 DIAGNOSIS — L02211 Cutaneous abscess of abdominal wall: Secondary | ICD-10-CM | POA: Diagnosis not present

## 2019-08-16 DIAGNOSIS — I10 Essential (primary) hypertension: Secondary | ICD-10-CM | POA: Diagnosis not present

## 2019-08-16 DIAGNOSIS — Z8041 Family history of malignant neoplasm of ovary: Secondary | ICD-10-CM

## 2019-08-16 DIAGNOSIS — K7460 Unspecified cirrhosis of liver: Secondary | ICD-10-CM | POA: Diagnosis not present

## 2019-08-16 DIAGNOSIS — Z885 Allergy status to narcotic agent status: Secondary | ICD-10-CM

## 2019-08-16 DIAGNOSIS — Z87891 Personal history of nicotine dependence: Secondary | ICD-10-CM | POA: Diagnosis not present

## 2019-08-16 DIAGNOSIS — Z8505 Personal history of malignant neoplasm of liver: Secondary | ICD-10-CM | POA: Diagnosis not present

## 2019-08-16 DIAGNOSIS — K7030 Alcoholic cirrhosis of liver without ascites: Secondary | ICD-10-CM | POA: Diagnosis not present

## 2019-08-16 DIAGNOSIS — Z20828 Contact with and (suspected) exposure to other viral communicable diseases: Secondary | ICD-10-CM | POA: Diagnosis present

## 2019-08-16 DIAGNOSIS — K5730 Diverticulosis of large intestine without perforation or abscess without bleeding: Secondary | ICD-10-CM | POA: Diagnosis not present

## 2019-08-16 DIAGNOSIS — R739 Hyperglycemia, unspecified: Secondary | ICD-10-CM | POA: Diagnosis present

## 2019-08-16 DIAGNOSIS — D649 Anemia, unspecified: Secondary | ICD-10-CM | POA: Diagnosis present

## 2019-08-16 LAB — LACTIC ACID, PLASMA
Lactic Acid, Venous: 1.5 mmol/L (ref 0.5–1.9)
Lactic Acid, Venous: 1.5 mmol/L (ref 0.5–1.9)

## 2019-08-16 LAB — CBC WITH DIFFERENTIAL/PLATELET
Abs Immature Granulocytes: 0.04 10*3/uL (ref 0.00–0.07)
Basophils Absolute: 0 10*3/uL (ref 0.0–0.1)
Basophils Relative: 1 %
Eosinophils Absolute: 0.2 10*3/uL (ref 0.0–0.5)
Eosinophils Relative: 4 %
HCT: 38.5 % (ref 36.0–46.0)
Hemoglobin: 11.5 g/dL — ABNORMAL LOW (ref 12.0–15.0)
Immature Granulocytes: 1 %
Lymphocytes Relative: 33 %
Lymphs Abs: 1.7 10*3/uL (ref 0.7–4.0)
MCH: 26.1 pg (ref 26.0–34.0)
MCHC: 29.9 g/dL — ABNORMAL LOW (ref 30.0–36.0)
MCV: 87.3 fL (ref 80.0–100.0)
Monocytes Absolute: 0.3 10*3/uL (ref 0.1–1.0)
Monocytes Relative: 6 %
Neutro Abs: 2.9 10*3/uL (ref 1.7–7.7)
Neutrophils Relative %: 55 %
Platelets: 260 10*3/uL (ref 150–400)
RBC: 4.41 MIL/uL (ref 3.87–5.11)
RDW: 14.4 % (ref 11.5–15.5)
WBC: 5.2 10*3/uL (ref 4.0–10.5)
nRBC: 0 % (ref 0.0–0.2)

## 2019-08-16 LAB — SARS CORONAVIRUS 2 (TAT 6-24 HRS): SARS Coronavirus 2: NEGATIVE

## 2019-08-16 LAB — COMPREHENSIVE METABOLIC PANEL
ALT: 17 U/L (ref 0–44)
AST: 22 U/L (ref 15–41)
Albumin: 3.5 g/dL (ref 3.5–5.0)
Alkaline Phosphatase: 102 U/L (ref 38–126)
Anion gap: 10 (ref 5–15)
BUN: 17 mg/dL (ref 8–23)
CO2: 22 mmol/L (ref 22–32)
Calcium: 8.8 mg/dL — ABNORMAL LOW (ref 8.9–10.3)
Chloride: 107 mmol/L (ref 98–111)
Creatinine, Ser: 0.88 mg/dL (ref 0.44–1.00)
GFR calc Af Amer: >60 mL/min (ref >60)
GFR calc non Af Amer: >60 mL/min (ref >60)
Glucose, Bld: 126 mg/dL — ABNORMAL HIGH (ref 70–99)
Potassium: 3.7 mmol/L (ref 3.5–5.1)
Sodium: 139 mmol/L (ref 135–145)
Total Bilirubin: 0.5 mg/dL (ref 0.3–1.2)
Total Protein: 7.8 g/dL (ref 6.5–8.1)

## 2019-08-16 MED ORDER — ONDANSETRON HCL 4 MG/2ML IJ SOLN: 4.0000 mg | Freq: Four times a day (QID) | INTRAMUSCULAR | Status: DC | PRN

## 2019-08-16 MED ORDER — ENOXAPARIN SODIUM 40 MG/0.4ML ~~LOC~~ SOLN: 40.0000 mg | SUBCUTANEOUS | Status: DC

## 2019-08-16 MED ORDER — ASPIRIN EC 81 MG PO TBEC
81.0000 mg | DELAYED_RELEASE_TABLET | Freq: Every day | ORAL | Status: DC
  Administered 2019-08-16 – 2019-08-19 (×4): 81 mg via ORAL
  Filled 2019-08-16 (×4): qty 1

## 2019-08-16 MED ORDER — CLINDAMYCIN PHOSPHATE 900 MG/50ML IV SOLN: 900.0000 mg | Freq: Once | INTRAVENOUS | Status: DC

## 2019-08-16 MED ORDER — LORATADINE 10 MG PO TABS
10.0000 mg | ORAL_TABLET | Freq: Every day | ORAL | Status: DC
  Administered 2019-08-16 – 2019-08-19 (×4): 10 mg via ORAL
  Filled 2019-08-16 (×4): qty 1

## 2019-08-16 MED ORDER — HYDROCODONE-ACETAMINOPHEN 5-325 MG PO TABS
1.0000 | ORAL_TABLET | Freq: Four times a day (QID) | ORAL | Status: DC | PRN
  Administered 2019-08-16 – 2019-08-19 (×8): 1 via ORAL
  Filled 2019-08-16 (×8): qty 1

## 2019-08-16 MED ORDER — IOHEXOL 300 MG/ML  SOLN
30.0000 mL | Freq: Once | INTRAMUSCULAR | Status: AC | PRN
  Administered 2019-08-16: 30 mL via ORAL

## 2019-08-16 MED ORDER — BACID PO TABS
2.0000 | ORAL_TABLET | Freq: Three times a day (TID) | ORAL | Status: DC
  Filled 2019-08-16 (×2): qty 2

## 2019-08-16 MED ORDER — SACCHAROMYCES BOULARDII 250 MG PO CAPS
250.0000 mg | ORAL_CAPSULE | Freq: Two times a day (BID) | ORAL | Status: DC
  Administered 2019-08-16 – 2019-08-19 (×4): 250 mg via ORAL
  Filled 2019-08-16 (×6): qty 1

## 2019-08-16 MED ORDER — VITAMIN C 500 MG PO TABS
500.0000 mg | ORAL_TABLET | Freq: Every day | ORAL | Status: DC
  Administered 2019-08-16 – 2019-08-19 (×4): 500 mg via ORAL
  Filled 2019-08-16 (×4): qty 1

## 2019-08-16 MED ORDER — FUROSEMIDE 20 MG PO TABS
20.0000 mg | ORAL_TABLET | Freq: Every day | ORAL | Status: DC
  Administered 2019-08-16 – 2019-08-18 (×3): 20 mg via ORAL
  Filled 2019-08-16 (×3): qty 1

## 2019-08-16 MED ORDER — ACETAMINOPHEN 650 MG RE SUPP: 650.0000 mg | Freq: Four times a day (QID) | RECTAL | Status: DC | PRN

## 2019-08-16 MED ORDER — VANCOMYCIN HCL 10 G IV SOLR
1500.0000 mg | INTRAVENOUS | Status: DC
  Administered 2019-08-17 – 2019-08-19 (×3): 1500 mg via INTRAVENOUS
  Filled 2019-08-16 (×3): qty 1500

## 2019-08-16 MED ORDER — ONDANSETRON HCL 4 MG PO TABS: 4.0000 mg | ORAL_TABLET | Freq: Four times a day (QID) | ORAL | Status: DC | PRN

## 2019-08-16 MED ORDER — VANCOMYCIN HCL 10 G IV SOLR
2000.0000 mg | Freq: Once | INTRAVENOUS | Status: AC
  Administered 2019-08-16: 2000 mg via INTRAVENOUS
  Filled 2019-08-16: qty 2000

## 2019-08-16 MED ORDER — LOSARTAN POTASSIUM 50 MG PO TABS
100.0000 mg | ORAL_TABLET | Freq: Every day | ORAL | Status: DC
  Administered 2019-08-16 – 2019-08-19 (×4): 100 mg via ORAL
  Filled 2019-08-16 (×4): qty 2

## 2019-08-16 MED ORDER — ACETAMINOPHEN 325 MG PO TABS: 650.0000 mg | ORAL_TABLET | Freq: Four times a day (QID) | ORAL | Status: DC | PRN

## 2019-08-16 MED ORDER — RIFAMPIN 150 MG PO CAPS
150.0000 mg | ORAL_CAPSULE | Freq: Two times a day (BID) | ORAL | Status: DC
  Filled 2019-08-16: qty 1

## 2019-08-16 MED ORDER — VANCOMYCIN HCL IN DEXTROSE 1-5 GM/200ML-% IV SOLN: 1000.0000 mg | Freq: Once | INTRAVENOUS | Status: DC

## 2019-08-16 MED ORDER — PANTOPRAZOLE SODIUM 40 MG PO TBEC
40.0000 mg | DELAYED_RELEASE_TABLET | Freq: Every day | ORAL | Status: DC
  Administered 2019-08-16: 40 mg via ORAL
  Filled 2019-08-16: qty 1

## 2019-08-16 NOTE — Consult Note (Addendum)
[facility] Nurse wound consult note Consultation was completed by review of records, images and assistance from the bedside nurse/clinical staff.    Reason for Consult: cellulitis Wound type: full thickness draining ulceration on pannus  Pressure Injury POA: NA Drainage (amount, consistency, odor) yellow, brown noted in images  Periwound: induration and erythema described by MD and noted in images.   Dressing procedure/placement/frequency: 1/4" iodoform packing to wick draining ulcerations.  Consider work up for abdominal abscess/fluid collections.  [place], [place], [place]

## 2019-08-16 NOTE — Consult Note (Addendum)
Samantha [name] [birth year]  [id number].    Requesting MD: Dr. Skipper Cliche Chief Complaint/Reason for Consult: abdominal wall cellulitis/abscess  HPI:  This is a 68 year old morbidly obese black female with a history of liver cancer status post chemoembolization by interventional radiology as well as CT-guided tissue ablation currently in remission, cirrhosis, hepatitis C, hypertension, morbid obesity who developed a boil on her abdominal wall about 2 weeks ago.  She saw her primary care doctor for this and was placed on antibiotics.  It then began to spontaneously drain a couple days later but due to persistent drainage she called her primary care back.  They placed her on a different antibiotic.  Due to continued issues and draining, she saw Dr. Ronnald Ramp last Wednesday who then performed a small incision and drainage of this area.  He once again changed her antibiotics to something different.  She then had a culture which revealed MRSA.  She was supposed to follow-up with him this week but due to worsening abdominal pain and continued drainage, she came to the [facility] emergency department last night for further evaluation.  She denies any fevers at home.  She denies any chest pain, shortness of breath, nausea, vomiting, diarrhea.  We have been asked to evaluate her today for further recommendations.  ROS: ROS: Please see HPI otherwise all other systems have been reviewed and are currently negative  Family History  Problem Relation Age of Onset  . Hypertension Mother   . Ovarian cancer Mother   . Hypertension Father   . Cancer Father 68       prostate cancer   . Diabetes Father   . Diabetes Other   . Hypertension Other   . Colon cancer Neg Hx   . Esophageal cancer Neg Hx   . Rectal cancer Neg Hx   . Stomach cancer Neg Hx   . Breast cancer Neg Hx     Past Medical History:  Diagnosis Date  . Allergy    seasonal  . Cirrhosis ([place]) 2018  . Diverticulosis   . Edema   .  Elevated glucose    no longer an issue 03-18-16  . Fatty liver 2017  . GERD (gastroesophageal reflux disease)   . H/O seasonal allergies   . Hepatitis C    hepatitis c completed march 2017  . Hyperlipidemia 2011  . Hypertension   . LBP (low back pain)   . Liver cancer ([place]) 2017   IN REMISSION. WHEN IDAGNONED SIZE OF GRAPEFRUIT   . Morbid obesity ([place])   . Osteoarthritis    B knees  . Right middle lobe pulmonary nodule    resolved 2018  . Uterine fibroid   . Wears glasses     Past Surgical History:  Procedure Laterality Date  . BIOPSY  07/04/2019   Procedure: BIOPSY;  Surgeon: Jerene Bears, MD;  Location: [facility];  Service: Gastroenterology;;  . CHOLECYSTECTOMY    . COLONOSCOPY N/A 01/09/2015   Procedure: COLONOSCOPY;  Surgeon: Jerene Bears, MD;  Location: [facility];  Service: Gastroenterology;  Laterality: N/A;  . ESOPHAGOGASTRODUODENOSCOPY (EGD) WITH PROPOFOL N/A 06/10/2016   Procedure: ESOPHAGOGASTRODUODENOSCOPY (EGD) WITH PROPOFOL;  Surgeon: Jerene Bears, MD;  Location: [facility];  Service: Gastroenterology;  Laterality: N/A;  . ESOPHAGOGASTRODUODENOSCOPY (EGD) WITH PROPOFOL N/A 07/04/2019   Procedure: ESOPHAGOGASTRODUODENOSCOPY (EGD) WITH PROPOFOL;  Surgeon: Jerene Bears, MD;  Location: [facility];  Service: Gastroenterology;  Laterality: N/A;  . HYSTEROSCOPY W/D&C N/A 10/15/2018  Procedure: DILATATION AND CURETTAGE /HYSTEROSCOPY;  Surgeon: Aloha Gell, MD;  Location: [facility];  Service: Gynecology;  Laterality: N/A;  . IR GENERIC HISTORICAL  07/03/2016   IR RADIOLOGIST EVAL & MGMT 07/03/2016 [name], MD [facility] INTERV RAD  . IR GENERIC HISTORICAL  11/18/2016   IR RADIOLOGIST EVAL & MGMT 11/18/2016 [name], MD [facility] INTERV RAD  . IR RADIOLOGIST EVAL & MGMT  04/09/2017  . IR RADIOLOGIST EVAL & MGMT  11/05/2017  . JOINT REPLACEMENT     BILATERAL KNEES  . left shoulder arthroplasty    . right knee replacement  2007  . total knee replacement L   03-13-08  . TUBAL LIGATION      Social History:  reports that she quit smoking about 11 years ago. She has a 5.00 pack-year smoking history. She has never used smokeless tobacco. She reports that she does not drink alcohol or use drugs.  Allergies:  Allergies  Allergen Reactions  . Amlodipine Swelling and Other (See Comments)    Edema whole body, especially in feet  . Benazepril Hcl Cough  . Codeine Itching    Medications Prior to Admission  Medication Sig Dispense Refill  . aspirin EC 81 MG tablet Take 81 mg by mouth daily.    Marland Kitchen BLACK COHOSH EXTRACT PO Take 1 tablet by mouth daily.    . Cholecalciferol (EQL VITAMIN D3) 1000 UNITS tablet Take 1,000 Units by mouth daily.     . clindamycin (CLEOCIN) 150 MG capsule Take 1 capsule (150 mg total) by mouth 3 (three) times daily for 7 days. 21 capsule 0  . furosemide (LASIX) 20 MG tablet Take 1-2 tablets (20-40 mg total) by mouth daily. (Patient taking differently: Take 20 mg by mouth at bedtime. ) 180 tablet 3  . glucosamine-chondroitin 500-400 MG tablet Take 1 tablet by mouth daily.    Marland Kitchen loratadine (CLARITIN) 10 MG tablet Take 1 tablet by mouth once daily 90 tablet 1  . losartan (COZAAR) 100 MG tablet Take 1 tablet (100 mg total) by mouth daily. 90 tablet 3  . meloxicam (MOBIC) 7.5 MG tablet TAKE 1 TO 2 TABLETS BY MOUTH ONCE DAILY AS NEEDED FOR PAIN (Patient taking differently: Take 7.5-15 mg by mouth daily as needed (pain.). ) 60 tablet 1  . Multiple Vitamin (MULTIVITAMIN WITH MINERALS) TABS tablet Take 1 tablet by mouth daily.     . naproxen sodium (ALEVE) 220 MG tablet Take 220 mg by mouth daily as needed (for pain.).    Marland Kitchen pantoprazole (PROTONIX) 40 MG tablet Take 1 tablet (40 mg total) by mouth daily. 90 tablet 3  . potassium chloride SA (K-DUR) 20 MEQ tablet Take 1 tablet (20 mEq total) by mouth daily. 90 tablet 3  . promethazine (PHENERGAN) 12.5 MG tablet Take 1 tablet (12.5 mg total) by mouth every 6 (six) hours as needed for nausea or  vomiting. 30 tablet 0  . rifampin (RIFADIN) 150 MG capsule Take 1 capsule (150 mg total) by mouth 2 (two) times daily for 7 days. 14 capsule 0  . saccharomyces boulardii (FLORASTOR) 250 MG capsule Take 1 capsule (250 mg total) by mouth 2 (two) times daily. 30 capsule 0  . vitamin C (ASCORBIC ACID) 500 MG tablet Take 500 mg by mouth daily.       Physical Exam: Blood pressure 119/79, pulse 83, temperature 98.4 F (36.9 C), temperature source Oral, resp. rate 20, height 5\' 1"  (1.549 m), weight 131.5 kg, SpO2 100 %. General: pleasant, morbidly obese  black female who is laying in bed in NAD HEENT: head is normocephalic, atraumatic.  Sclera are noninjected.  PERRL.  Ears and nose without any masses or lesions.  Mouth is pink and moist Heart: regular, rate, and rhythm.  Normal s1,s2. No obvious murmurs, gallops, or rubs noted.  Palpable radial and pedal pulses bilaterally Lungs: CTAB, no wheezes, rhonchi, or rales noted.  Respiratory effort nonlabored Abd: soft, very tender across her lower abdomen where she has a small 1 x 1 cm opening just right, lateral, inferior to her umbilicus.  She has about a 10 x 15 cm area of induration surrounding this opening.  This all is exquisitely tender to touch.  Severely obese, +BS, no masses, hernias, or organomegaly MS: all 4 extremities are symmetrical with no cyanosis, clubbing.  Trace edema to bilateral lower extremities. Skin: warm and dry with no masses, lesions, or rashes Psych: A&Ox3 with an appropriate affect.   Results for orders placed or performed during the hospital encounter of 08/16/19 (from the past 48 hour(s))  Lactic acid, plasma     Status: None   Collection Time: 08/16/19  1:26 AM  Result Value Ref Range   Lactic Acid, Venous 1.5 0.5 - 1.9 mmol/L    Comment: Performed at [facility], [place] [address] [place], [place] [address]  Comprehensive metabolic panel     Status: Abnormal   Collection Time: 08/16/19  1:26 AM   Result Value Ref Range   Sodium 139 135 - 145 mmol/L   Potassium 3.7 3.5 - 5.1 mmol/L   Chloride 107 98 - 111 mmol/L   CO2 22 22 - 32 mmol/L   Glucose, Bld 126 (H) 70 - 99 mg/dL   BUN 17 8 - 23 mg/dL   Creatinine, Ser 0.88 0.44 - 1.00 mg/dL   Calcium 8.8 (L) 8.9 - 10.3 mg/dL   Total Protein 7.8 6.5 - 8.1 g/dL   Albumin 3.5 3.5 - 5.0 g/dL   AST 22 15 - 41 U/L   ALT 17 0 - 44 U/L   Alkaline Phosphatase 102 38 - 126 U/L   Total Bilirubin 0.5 0.3 - 1.2 mg/dL   GFR calc non Af Amer >60 >60 mL/min   GFR calc Af Amer >60 >60 mL/min   Anion gap 10 5 - 15    Comment: Performed at [facility], [place] [address], [place], [place] [address]  CBC with Differential     Status: Abnormal   Collection Time: 08/16/19  1:26 AM  Result Value Ref Range   WBC 5.2 4.0 - 10.5 K/uL   RBC 4.41 3.87 - 5.11 MIL/uL   Hemoglobin 11.5 (L) 12.0 - 15.0 g/dL   HCT 38.5 36.0 - 46.0 %   MCV 87.3 80.0 - 100.0 fL   MCH 26.1 26.0 - 34.0 pg   MCHC 29.9 (L) 30.0 - 36.0 g/dL   RDW 14.4 11.5 - 15.5 %   Platelets 260 150 - 400 K/uL   nRBC 0.0 0.0 - 0.2 %   Neutrophils Relative % 55 %   Neutro Abs 2.9 1.7 - 7.7 K/uL   Lymphocytes Relative 33 %   Lymphs Abs 1.7 0.7 - 4.0 K/uL   Monocytes Relative 6 %   Monocytes Absolute 0.3 0.1 - 1.0 K/uL   Eosinophils Relative 4 %   Eosinophils Absolute 0.2 0.0 - 0.5 K/uL   Basophils Relative 1 %   Basophils Absolute 0.0 0.0 - 0.1 K/uL   Immature Granulocytes  1 %   Abs Immature Granulocytes 0.04 0.00 - 0.07 K/uL    Comment: Performed at [facility], [place] [address], [place], [place] [address]  Lactic acid, plasma     Status: None   Collection Time: 08/16/19  6:01 AM  Result Value Ref Range   Lactic Acid, Venous 1.5 0.5 - 1.9 mmol/L    Comment: Performed at [facility], [place] [address] [place] [address] [place], [place] [address]  SARS CORONAVIRUS 2 Nasal Swab Aptima Multi Swab     Status: None   Collection Time: 08/16/19  6:04 AM    Specimen: Aptima Multi Swab; Nasal Swab  Result Value Ref Range   SARS Coronavirus 2 NEGATIVE NEGATIVE    Comment: (NOTE) SARS-CoV-2 target nucleic acids are NOT DETECTED. The SARS-CoV-2 RNA is generally detectable in upper and lower respiratory specimens during the acute phase of infection. Negative results do not preclude SARS-CoV-2 infection, do not rule out co-infections with other pathogens, and should not be used as the sole basis for treatment or other patient management decisions. Negative results must be combined with clinical observations, patient history, and epidemiological information. The expected result is Negative. Fact Sheet for Patients: SugarRoll.be Fact Sheet for Healthcare Providers: https://www.woods-mathews.com/ This test is not yet approved or cleared by the [place] FDA and  has been authorized for detection and/or diagnosis of SARS-CoV-2 by FDA under an Emergency Use Authorization (EUA). This EUA will remain  in effect (meaning this test can be used) for the duration of the COVID-19 declaration under Section 56 4(b)(1) of the Act, 21 U.S.C. section 360bbb-3(b)(1), unless the authorization is terminated or revoked sooner. Performed at [facility], [place] [address] [place], [place] [address]    No results found.    Assessment/Plan Severely obese (BMI 54.8) History of hepatocellular carcinoma, status post intervention now in remission  Med oncology - Truitt Merle (she is referred to in Dr. Katrinka Blazing note, but I cannot find a note by her in Epic) Cirrhosis Hepatitis C Hypertension  Abdominal wall abscess (MRSA)  The patient has been dealing with this area for the last 2 weeks.  She has had multiple outpatient antibiotics as well as an incision and drainage by her primary care office.  Patient continues to have induration, pain, and drainage.    A CT scan has been ordered by the primary service.  It  appears based off of her physical exam that she will likely need to go to the operating room so that we can do a larger debridement of the soft tissue in this area.    She will be n.p.o. after midnight with plans for surgical intervention tomorrow.  Agree with vancomycin therapy.  We will continue to follow the patient along with you.  FEN -regular diet, n.p.o. after midnight VTE -okay for chemical prophylaxis from our standpoint ID -vancomycin  Henreitta Cea, [facility] 08/16/2019, 4:08 PM Pager: [phone]  Agree with above.  MRSA abscess/cellulitis of the lower abdomen/pannus.  Would be best served with I&D in the OR under general anesthesia.  Will aim for surgery tomorrow. Dr. Dema Severin will be our surgeon tomorrow.  Alphonsa Overall, MD, [facility] Surgery Pager: [phone] Office phone:  [phone]

## 2019-08-16 NOTE — Progress Notes (Signed)
PROGRESS NOTE    Patient: Samantha [name]                            PCP: Samantha Anger, MD                    DOB: August 31, 1951            DOA: 08/16/2019 ZDG:[id number]             DOS: 08/16/2019, 3:08 PM   LOS: 0 days   Date of Service: The patient was seen and examined on 08/16/2019  Subjective:   The patient was seen and examined, still complaining of a lower abdominal pain.  With a small opening, reporting to wound cellulitis has been going on for past 3 weeks has been on multiple antibiotics with no improvement.  Patient reporting has been draining pus she has been changing dressing at home.  Brief Narrative:    Samantha [name] is a 68 y.o. female with medical history significant of seasonal allergy, liver cirrhosis, diverticulosis, edema, hyperglycemia, GERD, hepatitis C, hyperlipidemia, hypertension, morbid obesity, osteoarthritis, history of pulmonary nodule, uterine fibroid who is coming to the emergency department due to progressively worse right lower abdomen wound due to MRSA that has not improved on oral clindamycin.  She denies fever, but complains of fatigue, chills and profuse night sweats.  No rhinorrhea, sore throat, wheezing, hemoptysis, chest pain, worsening dyspnea, palpitations,, but occasional dizziness and frequent lower extremities pitting edema.  She has had some nausea since taking the clindamycin, but denies emesis, abdominal pain, diarrhea, constipation, melena or hematochezia.  No dysuria, frequency or hematuria.  Denies polyuria, polydipsia, polyphagia or blurred vision.  ED Course: She will vital signs temperature 98.3 F, pulse 96, respirations 20, blood pressure 150/97 mmHg and O2 sat 99% on room air.  CBC was 5.2, hemoglobin 11.5 g/dL and platelets 260.  CMP shows normal values, except for a glucose of 126 and a calcium of 8.8 mg/dL (normal when corrected to albumin).  Lactic acid was normal.  Dr. Lorna Dibble headache was wondering if you guys can  take a look at this lady for me [name], BR E MDA date of birth 05/02/1951 she is a 68 year old obese female nondiabetic coming in for non-   Assessment & Plan:   Principal Problem:   Cellulitis of abdominal wall Active Problems:   OBESITY, MORBID   Essential hypertension   GERD   Hyperglycemia   Hepatic cirrhosis (HCC)   Hyperlipidemia   Anemia    Acute on chronic cellulitis of abdominal wall -Per patient has been going on over 3 4 weeks on multiple antibiotics -Per patient no improvement -Outside cultures were obtained, susceptibility as below -History of MRSA, patient has been given a dose of clindamycin, currently on IV vancomycin -CT of abdomen pelvis will be obtained to evaluate extensive cellulitis/abscess for possible I&D -General surgery consulted -appreciate input -Drainage will be recultured, continue vancomycin per pharmacy dosing -Continue pain management    Essential hypertension -Stable continue home medication: Continue furosemide 20 mg p.o. bedtime. Continue losartan 100 mg p.o. daily. -Monitor electrolytes and kidney function    GERD -Stable  -We will withhold Protonix to reduce the possibility of C. difficile with chronic antibiotics      Hepatic cirrhosis (HCC) History of hep C and hepatocellular CA. No signs of decompensation at this time. Monitor liver function tests.    Hyperlipidemia No  medical treatment due to hepatic condition. Continue lifestyle modifications.    Anemia -Anemia work-up including  -Monitoring H&H     Hyperglycemia Carbohydrate modified diet. CBG q. CHS,     Cultures; Wound cultures 08/16/2019 >>  Antimicrobials: Clindamycin IV eight 1820 x 1 dose. Vancomycin IV 08/16/2019 ::>  DVT prophylaxis: SCD/Compression stockings Code Status:   Code Status: Full Code Family Communication: No family member present at bedside- attempt will be made to update daily The above findings and plan of care has  been discussed with patient and family in detail,  they expressed understanding and agreement of above. Disposition Plan:  >3 days Admission status:  NPATIEN -   Consultants: General surgery     08/09/2019 wound culture  Susceptibility         Methicillin resistant staphylococcus aureus    AEROBIC CULT, GRAM STAIN POSITIVE 1    CIPROFLOXACIN >=8  Resistant    CLINDAMYCIN <=0.25  Sensitive    ERYTHROMYCIN >=8  Resistant    GENTAMICIN <=0.5  Sensitive    LEVOFLOXACIN 4  Resistant    OXACILLIN NR  Resistant1    TETRACYCLINE <=1  Sensitive    TRIMETH/SULFA >=320  Resistant2    VANCOMYCIN 1  Sensitive    Radiological Exams on Admission:     Procedures:   No admission procedures for hospital encounter.   Antimicrobials:  Anti-infectives (From admission, onward)   Start     Dose/Rate Route Frequency Ordered Stop   08/17/19 0800  vancomycin (VANCOCIN) 1,500 mg in sodium chloride 0.9 % 500 mL IVPB     1,500 mg 250 mL/hr over 120 Minutes Intravenous Every 24 hours 08/16/19 0550     08/16/19 1000  rifampin (RIFADIN) capsule 150 mg  Status:  Discontinued     150 mg Oral 2 times daily 08/16/19 0804 08/16/19 1016   08/16/19 0545  vancomycin (VANCOCIN) IVPB 1000 mg/200 mL premix  Status:  Discontinued     1,000 mg 200 mL/hr over 60 Minutes Intravenous  Once 08/16/19 0534 08/16/19 0536   08/16/19 0545  vancomycin (VANCOCIN) 2,000 mg in sodium chloride 0.9 % 500 mL IVPB     2,000 mg 250 mL/hr over 120 Minutes Intravenous  Once 08/16/19 0537 08/16/19 0846   08/16/19 0530  clindamycin (CLEOCIN) IVPB 900 mg  Status:  Discontinued     900 mg 100 mL/hr over 30 Minutes Intravenous  Once 08/16/19 0522 08/16/19 0555       Medication:  . aspirin EC  81 mg Oral Daily  . furosemide  20 mg Oral QHS  . loratadine  10 mg Oral Daily  . losartan  100 mg Oral Daily  . pantoprazole  40 mg Oral Daily  . saccharomyces boulardii  250 mg Oral BID  . vitamin C  500  mg Oral Daily    acetaminophen **OR** acetaminophen, HYDROcodone-acetaminophen, ondansetron **OR** ondansetron (ZOFRAN) IV   Objective:   Vitals:   08/16/19 0739 08/16/19 0811 08/16/19 0812 08/16/19 1429  BP: (!) 158/108 (!) 171/102 (!) 153/109 119/79  Pulse: 80 83 87 83  Resp: 17 20 20 20   Temp:  97.8 F (36.6 C) (!) 97.3 F (36.3 C) 98.4 F (36.9 C)  TempSrc:  Oral  Oral  SpO2: 100% (!) 75% 96% 100%  Weight:      Height:       No intake or output data in the 24 hours ending 08/16/19 1508 Filed Weights   08/16/19 0117  Weight: 131.5 kg  Examination:   Physical Exam  Constitution:  Alert, cooperative, no distress,  Appears calm and comfortable  Psychiatric: Normal and stable mood and affect, cognition intact,   HEENT: Normocephalic, PERRL, otherwise with in Normal limits  Chest:Chest symmetric Cardio vascular:  S1/S2, RRR, No murmure, No Rubs or Gallops  pulmonary: Clear to auscultation bilaterally, respirations unlabored, negative wheezes / crackles Abdomen: Soft, tender below umbilical with a small opening and drainage, non-distended, bowel sounds,no masses, no organomegaly Muscular skeletal: Limited exam - in bed, able to move all 4 extremities, Normal strength,  Neuro: CNII-XII intact. , normal motor and sensation, reflexes intact  Extremities: No pitting edema lower extremities, +2 pulses  Skin: Dry, warm to touch, Below umbilical large tender erythematous with a small opening and drainage, negative for any other open wounds are rashes.         LABs:  CBC Latest Ref Rng & Units 08/16/2019 10/04/2018 05/27/2018  WBC 4.0 - 10.5 K/uL 5.2 3.6(L) 4.4  Hemoglobin 12.0 - 15.0 g/dL 11.5(L) 11.9(L) 12.0  Hematocrit 36.0 - 46.0 % 38.5 38.5 36.8  Platelets 150 - 400 K/uL 260 185 195   CMP Latest Ref Rng & Units 08/16/2019 10/04/2018 05/27/2018  Glucose 70 - 99 mg/dL 126(H) 89 89  BUN 8 - 23 mg/dL 17 16 19   Creatinine 0.44 - 1.00 mg/dL 0.88 0.81 0.85  Sodium  135 - 145 mmol/L 139 141 144  Potassium 3.5 - 5.1 mmol/L 3.7 4.3 4.4  Chloride 98 - 111 mmol/L 107 109 111(H)  CO2 22 - 32 mmol/L 22 26 25   Calcium 8.9 - 10.3 mg/dL 8.8(L) 9.1 9.2  Total Protein 6.5 - 8.1 g/dL 7.8 - 7.0  Total Bilirubin 0.3 - 1.2 mg/dL 0.5 - 0.3  Alkaline Phos 38 - 126 U/L 102 - -  AST 15 - 41 U/L 22 - 14  ALT 0 - 44 U/L 17 - 9        SIGNED: Deatra James, MD, FACP, FHM. [facility],  Pager [phone]  If 7PM-7AM, please contact night-coverage [email or web address] 08/16/2019, 3:08 PM

## 2019-08-16 NOTE — ED Notes (Addendum)
ED TO INPATIENT HANDOFF REPORT  ED Nurse Name and Phone #: Lannette Avellino,RN [phone]  S Name/Age/Gender Samantha [name] 68 y.o. female Room/Bed: WA14/WA14  Code Status   Code Status: Full Code  Home/SNF/Other Home Patient oriented to: self, place, time and situation Is this baseline? Yes   Triage Complete: Triage complete  Chief Complaint Dizziness; Abscess; Abdominal Pain  Triage Note Patient here from home with complaints of abscess to right lower abd. Hx of same. Antibiotics with no relief. Reports area is hard and redden. Painful to area 10/10. Also states that she is under the treatment for MRSA to the area.    Allergies Allergies  Allergen Reactions  . Amlodipine Swelling and Other (See Comments)    Edema whole body, especially in feet  . Benazepril Hcl Cough  . Codeine Itching    Level of Care/Admitting Diagnosis ED Disposition    ED Disposition Condition [facility] Area: [place] [[id number]]  Level of Care: Med-Surg [16]  Covid Evaluation: Asymptomatic Screening Protocol (No Symptoms)  Diagnosis: Cellulitis of abdominal wall [510258]  Admitting Physician: Reubin Milan [[id number]]  Attending Physician: Reubin Milan [[id number]]  Estimated length of stay: past midnight tomorrow  Certification:: I certify this patient will need inpatient services for at least 2 midnights  PT Class (Do Not Modify): Inpatient [101]  PT Acc Code (Do Not Modify): Private [1]       B Medical/Surgery History Past Medical History:  Diagnosis Date  . Allergy    seasonal  . Cirrhosis ([place]) 2018  . Diverticulosis   . Edema   . Elevated glucose    no longer an issue 03-18-16  . Fatty liver 2017  . GERD (gastroesophageal reflux disease)   . H/O seasonal allergies   . Hepatitis C    hepatitis c completed march 2017  . Hyperlipidemia 2011  . Hypertension   . LBP (low back pain)   . Liver cancer ([place]) 2017   IN REMISSION. WHEN  IDAGNONED SIZE OF GRAPEFRUIT   . Morbid obesity ([place])   . Osteoarthritis    B knees  . Right middle lobe pulmonary nodule    resolved 2018  . Uterine fibroid   . Wears glasses    Past Surgical History:  Procedure Laterality Date  . BIOPSY  07/04/2019   Procedure: BIOPSY;  Surgeon: Jerene Bears, MD;  Location: [facility];  Service: Gastroenterology;;  . CHOLECYSTECTOMY    . COLONOSCOPY N/A 01/09/2015   Procedure: COLONOSCOPY;  Surgeon: Jerene Bears, MD;  Location: [facility];  Service: Gastroenterology;  Laterality: N/A;  . ESOPHAGOGASTRODUODENOSCOPY (EGD) WITH PROPOFOL N/A 06/10/2016   Procedure: ESOPHAGOGASTRODUODENOSCOPY (EGD) WITH PROPOFOL;  Surgeon: Jerene Bears, MD;  Location: [facility];  Service: Gastroenterology;  Laterality: N/A;  . ESOPHAGOGASTRODUODENOSCOPY (EGD) WITH PROPOFOL N/A 07/04/2019   Procedure: ESOPHAGOGASTRODUODENOSCOPY (EGD) WITH PROPOFOL;  Surgeon: Jerene Bears, MD;  Location: [facility];  Service: Gastroenterology;  Laterality: N/A;  . HYSTEROSCOPY W/D&C N/A 10/15/2018   Procedure: DILATATION AND CURETTAGE /HYSTEROSCOPY;  Surgeon: Aloha Gell, MD;  Location: [facility];  Service: Gynecology;  Laterality: N/A;  . IR GENERIC HISTORICAL  07/03/2016   IR RADIOLOGIST EVAL & MGMT 07/03/2016 [name], MD [facility] INTER[facility] RAD  . IR GENERIC HISTORICAL  11/18/2016   IR RADIOLOGIST EVAL & MGMT 11/18/2016 [name], MD [facility] INTERV RAD  . IR RADIOLOGIST EVAL & MGMT  04/09/2017  . IR RADIOLOGIST EVAL & MGMT  11/05/2017  .  JOINT REPLACEMENT     BILATERAL KNEES  . left shoulder arthroplasty    . right knee replacement  2007  . total knee replacement L  03-13-08  . TUBAL LIGATION       A IV Location/Drains/Wounds Patient Lines/Drains/Airways Status   Active Line/Drains/Airways    Name:   Placement date:   Placement time:   Site:   Days:   Peripheral IV 06/10/16 Left;Anterior Forearm   06/10/16    0932    Forearm   1162   Peripheral IV 07/04/19 Right  Antecubital   07/04/19    0903    Antecubital   43   Peripheral IV 08/16/19 Right Arm   08/16/19    0645    Arm   less than 1   Incision (Closed) 10/15/18 Vagina Other (Comment)   10/15/18    0742     305   Incision - 1 Port Abdomen  Right;Upper   03/21/16    1025     1243          Intake/Output Last 24 hours No intake or output data in the 24 hours ending 08/16/19 0740  Labs/Imaging Results for orders placed or performed during the hospital encounter of 08/16/19 (from the past 48 hour(s))  Lactic acid, plasma     Status: None   Collection Time: 08/16/19  1:26 AM  Result Value Ref Range   Lactic Acid, Venous 1.5 0.5 - 1.9 mmol/L    Comment: Performed at [facility], [place] [address] [place], [place] [address]  Comprehensive metabolic panel     Status: Abnormal   Collection Time: 08/16/19  1:26 AM  Result Value Ref Range   Sodium 139 135 - 145 mmol/L   Potassium 3.7 3.5 - 5.1 mmol/L   Chloride 107 98 - 111 mmol/L   CO2 22 22 - 32 mmol/L   Glucose, Bld 126 (H) 70 - 99 mg/dL   BUN 17 8 - 23 mg/dL   Creatinine, Ser 0.88 0.44 - 1.00 mg/dL   Calcium 8.8 (L) 8.9 - 10.3 mg/dL   Total Protein 7.8 6.5 - 8.1 g/dL   Albumin 3.5 3.5 - 5.0 g/dL   AST 22 15 - 41 U/L   ALT 17 0 - 44 U/L   Alkaline Phosphatase 102 38 - 126 U/L   Total Bilirubin 0.5 0.3 - 1.2 mg/dL   GFR calc non Af Amer >60 >60 mL/min   GFR calc Af Amer >60 >60 mL/min   Anion gap 10 5 - 15    Comment: Performed at [facility], [place] [address] [place], [place] [address]  CBC with Differential     Status: Abnormal   Collection Time: 08/16/19  1:26 AM  Result Value Ref Range   WBC 5.2 4.0 - 10.5 K/uL   RBC 4.41 3.87 - 5.11 MIL/uL   Hemoglobin 11.5 (L) 12.0 - 15.0 g/dL   HCT 38.5 36.0 - 46.0 %   MCV 87.3 80.0 - 100.0 fL   MCH 26.1 26.0 - 34.0 pg   MCHC 29.9 (L) 30.0 - 36.0 g/dL   RDW 14.4 11.5 - 15.5 %   Platelets 260 150 - 400 K/uL   nRBC 0.0 0.0 - 0.2 %   Neutrophils Relative %  55 %   Neutro Abs 2.9 1.7 - 7.7 K/uL   Lymphocytes Relative 33 %   Lymphs Abs 1.7 0.7 - 4.0 K/uL   Monocytes Relative 6 %  Monocytes Absolute 0.3 0.1 - 1.0 K/uL   Eosinophils Relative 4 %   Eosinophils Absolute 0.2 0.0 - 0.5 K/uL   Basophils Relative 1 %   Basophils Absolute 0.0 0.0 - 0.1 K/uL   Immature Granulocytes 1 %   Abs Immature Granulocytes 0.04 0.00 - 0.07 K/uL    Comment: Performed at [facility], [place] [address] [place], [place] [address]  Lactic acid, plasma     Status: None   Collection Time: 08/16/19  6:01 AM  Result Value Ref Range   Lactic Acid, Venous 1.5 0.5 - 1.9 mmol/L    Comment: Performed at [facility], [place] [address], [place], [place] [address]   No results found.  Pending Labs Unresulted Labs (From admission, onward)    Start     Ordered   08/17/19 0500  HIV antibody (Routine Testing)  Tomorrow morning,   R     08/16/19 0557   08/17/19 0500  Comprehensive metabolic panel  Tomorrow morning,   R     08/16/19 0557   08/17/19 0500  CBC  Tomorrow morning,   R     08/16/19 0557   08/17/19 0500  Vitamin B12  (Anemia Panel (PNL))  Tomorrow morning,   R     08/16/19 0627   08/17/19 0500  Folate  (Anemia Panel (PNL))  Tomorrow morning,   R     08/16/19 0627   08/17/19 0500  Iron and TIBC  (Anemia Panel (PNL))  Tomorrow morning,   R     08/16/19 0627   08/17/19 0500  Ferritin  (Anemia Panel (PNL))  Tomorrow morning,   R     08/16/19 0627   08/17/19 0500  Reticulocytes  (Anemia Panel (PNL))  Tomorrow morning,   R     08/16/19 0627   08/16/19 0523  Blood culture (routine x 2)  BLOOD CULTURE X 2,   STAT     08/16/19 0522   08/16/19 0523  SARS CORONAVIRUS 2 Nasal Swab Aptima Multi Swab  (Asymptomatic/Tier 2 Patients Labs)  Once,   STAT    Question Answer Comment  Is this test for diagnosis or screening Screening   Symptomatic for COVID-19 as defined by CDC No   Hospitalized for COVID-19 No   Admitted to ICU for COVID-19  No   Previously tested for COVID-19 Yes   Resident in a congregate (group) care setting No   Employed in healthcare setting No   Pregnant No      08/16/19 0522          Vitals/Pain Today's Vitals   08/16/19 0116 08/16/19 0117 08/16/19 0315 08/16/19 0739  BP:  (!) 150/97 (!) 146/111 (!) 158/108  Pulse:  96 77 80  Resp:  20 17 17   Temp:  98.3 F (36.8 C)    TempSrc:  Oral    SpO2:  99% 100% 100%  Weight:  131.5 kg    Height:  5\' 1"  (1.549 m)    PainSc: 10-Worst pain ever       Isolation Precautions Contact precautions  Medications Medications  vancomycin (VANCOCIN) 2,000 mg in sodium chloride 0.9 % 500 mL IVPB (2,000 mg Intravenous New Bag/Given 08/16/19 0646)  vancomycin (VANCOCIN) 1,500 mg in sodium chloride 0.9 % 500 mL IVPB (has no administration in time range)  acetaminophen (TYLENOL) tablet 650 mg (has no administration in time range)    Or  acetaminophen (TYLENOL) suppository 650 mg (has no administration in time  range)  ondansetron (ZOFRAN) tablet 4 mg (has no administration in time range)    Or  ondansetron (ZOFRAN) injection 4 mg (has no administration in time range)    Mobility walks with person assist Low fall risk   Focused Assessments skin  R Recommendations: See Admitting Provider Note  Report given to: Lindon Romp, RN 5E  Additional Notes: Full Code

## 2019-08-16 NOTE — H&P (Signed)
History and Physical    Island Dohmen TFT:[id number] DOB: Jan 19, 1951 DOA: 08/16/2019  PCP: Cassandria Anger, MD   Patient coming from: Home.  I have personally briefly reviewed patient's old medical records in [place]  Chief Complaint: Abscess of abdomen.  HPI: Samantha [name] is a 68 y.o. female with medical history significant of seasonal allergy, liver cirrhosis, diverticulosis, edema, hyperglycemia, GERD, hepatitis C, hyperlipidemia, hypertension, morbid obesity, osteoarthritis, history of pulmonary nodule, uterine fibroid who is coming to the emergency department due to progressively worse right lower abdomen wound due to MRSA that has not improved on oral clindamycin.  She denies fever, but complains of fatigue, chills and profuse night sweats.  No rhinorrhea, sore throat, wheezing, hemoptysis, chest pain, worsening dyspnea, palpitations,, but occasional dizziness and frequent lower extremities pitting edema.  She has had some nausea since taking the clindamycin, but denies emesis, abdominal pain, diarrhea, constipation, melena or hematochezia.  No dysuria, frequency or hematuria.  Denies polyuria, polydipsia, polyphagia or blurred vision.  ED Course: She will vital signs temperature 98.3 F, pulse 96, respirations 20, blood pressure 150/97 mmHg and O2 sat 99% on room air.  CBC was 5.2, hemoglobin 11.5 g/dL and platelets 260.  CMP shows normal values, except for a glucose of 126 and a calcium of 8.8 mg/dL (normal when corrected to albumin).  Lactic acid was normal.  Review of Systems: As per HPI otherwise 10 point review of systems negative.   Past Medical History:  Diagnosis Date  . Allergy    seasonal  . Cirrhosis ([place]) 2018  . Diverticulosis   . Edema   . Elevated glucose    no longer an issue 03-18-16  . Fatty liver 2017  . GERD (gastroesophageal reflux disease)   . H/O seasonal allergies   . Hepatitis C    hepatitis c completed march 2017  .  Hyperlipidemia 2011  . Hypertension   . LBP (low back pain)   . Liver cancer ([place]) 2017   IN REMISSION. WHEN IDAGNONED SIZE OF GRAPEFRUIT   . Morbid obesity ([place])   . Osteoarthritis    B knees  . Right middle lobe pulmonary nodule    resolved 2018  . Uterine fibroid   . Wears glasses     Past Surgical History:  Procedure Laterality Date  . BIOPSY  07/04/2019   Procedure: BIOPSY;  Surgeon: Jerene Bears, MD;  Location: [facility];  Service: Gastroenterology;;  . CHOLECYSTECTOMY    . COLONOSCOPY N/A 01/09/2015   Procedure: COLONOSCOPY;  Surgeon: Jerene Bears, MD;  Location: [facility];  Service: Gastroenterology;  Laterality: N/A;  . ESOPHAGOGASTRODUODENOSCOPY (EGD) WITH PROPOFOL N/A 06/10/2016   Procedure: ESOPHAGOGASTRODUODENOSCOPY (EGD) WITH PROPOFOL;  Surgeon: Jerene Bears, MD;  Location: [facility];  Service: Gastroenterology;  Laterality: N/A;  . ESOPHAGOGASTRODUODENOSCOPY (EGD) WITH PROPOFOL N/A 07/04/2019   Procedure: ESOPHAGOGASTRODUODENOSCOPY (EGD) WITH PROPOFOL;  Surgeon: Jerene Bears, MD;  Location: [facility];  Service: Gastroenterology;  Laterality: N/A;  . HYSTEROSCOPY W/D&C N/A 10/15/2018   Procedure: DILATATION AND CURETTAGE /HYSTEROSCOPY;  Surgeon: Aloha Gell, MD;  Location: [facility];  Service: Gynecology;  Laterality: N/A;  . IR GENERIC HISTORICAL  07/03/2016   IR RADIOLOGIST EVAL & MGMT 07/03/2016 [name], MD [facility]  . IR GENERIC HISTORICAL  11/18/2016   IR RADIOLOGIST EVAL & MGMT 11/18/2016 [name], MD [facility]  . IR RADIOLOGIST EVAL & MGMT  04/09/2017  . IR RADIOLOGIST EVAL & MGMT  11/05/2017  . JOINT REPLACEMENT     BILATERAL KNEES  . left shoulder arthroplasty    . right knee replacement  2007  . total knee replacement L  03-13-08  . TUBAL LIGATION       reports that she quit smoking about 11 years ago. She has a 5.00 pack-year smoking history. She has never used smokeless tobacco. She reports that she does not drink  alcohol or use drugs.  Allergies  Allergen Reactions  . Amlodipine Swelling and Other (See Comments)    Edema whole body, especially in feet  . Benazepril Hcl Cough  . Codeine Itching    Family History  Problem Relation Age of Onset  . Hypertension Mother   . Ovarian cancer Mother   . Hypertension Father   . Cancer Father 42       prostate cancer   . Diabetes Father   . Diabetes Other   . Hypertension Other   . Colon cancer Neg Hx   . Esophageal cancer Neg Hx   . Rectal cancer Neg Hx   . Stomach cancer Neg Hx   . Breast cancer Neg Hx    Prior to Admission medications   Medication Sig Start Date End Date Taking? Authorizing Provider  aspirin EC 81 MG tablet Take 81 mg by mouth daily.   Yes [provider]  BLACK COHOSH EXTRACT PO Take 1 tablet by mouth daily.   Yes [provider]  Cholecalciferol (EQL VITAMIN D3) 1000 UNITS tablet Take 1,000 Units by mouth daily.    Yes [provider]  clindamycin (CLEOCIN) 150 MG capsule Take 1 capsule (150 mg total) by mouth 3 (three) times daily for 7 days. 08/12/19 08/19/19 Yes Janith Lima, MD  furosemide (LASIX) 20 MG tablet Take 1-2 tablets (20-40 mg total) by mouth daily. Patient taking differently: Take 20 mg by mouth at bedtime.  06/13/19  Yes Plotnikov, Evie Lacks, MD  glucosamine-chondroitin 500-400 MG tablet Take 1 tablet by mouth daily.   Yes [provider]  loratadine (CLARITIN) 10 MG tablet Take 1 tablet by mouth once daily 08/08/19  Yes Plotnikov, Evie Lacks, MD  losartan (COZAAR) 100 MG tablet Take 1 tablet (100 mg total) by mouth daily. 06/13/19  Yes Plotnikov, Evie Lacks, MD  meloxicam (MOBIC) 7.5 MG tablet TAKE 1 TO 2 TABLETS BY MOUTH ONCE DAILY AS NEEDED FOR PAIN Patient taking differently: Take 7.5-15 mg by mouth daily as needed (pain.).  06/13/19  Yes Plotnikov, Evie Lacks, MD  Multiple Vitamin (MULTIVITAMIN WITH MINERALS) TABS tablet Take 1 tablet by mouth daily.    Yes [provider]  naproxen sodium (ALEVE) 220 MG tablet Take 220 mg by mouth daily as needed (for pain.).   Yes [provider]  pantoprazole (PROTONIX) 40 MG tablet Take 1 tablet (40 mg total) by mouth daily. 06/13/19  Yes Plotnikov, Evie Lacks, MD  potassium chloride SA (K-DUR) 20 MEQ tablet Take 1 tablet (20 mEq total) by mouth daily. 06/13/19  Yes Plotnikov, Evie Lacks, MD  promethazine (PHENERGAN) 12.5 MG tablet Take 1 tablet (12.5 mg total) by mouth every 6 (six) hours as needed for nausea or vomiting. 08/09/19  Yes Janith Lima, MD  rifampin (RIFADIN) 150 MG capsule Take 1 capsule (150 mg total) by mouth 2 (two) times daily for 7 days. 08/09/19 08/16/19 Yes Janith Lima, MD  saccharomyces boulardii (FLORASTOR) 250 MG capsule Take 1 capsule (250 mg total) by mouth 2 (two) times daily.  11/24/18  Yes Plotnikov, Evie Lacks, MD  vitamin C (ASCORBIC ACID) 500 MG tablet Take 500 mg by mouth daily.   Yes [provider]    Physical Exam: Vitals:   08/16/19 0117 08/16/19 0315  BP: (!) 150/97 (!) 146/111  Pulse: 96 77  Resp: 20 17  Temp: 98.3 F (36.8 C)   TempSrc: Oral   SpO2: 99% 100%  Weight: 131.5 kg   Height: 5\' 1"  (1.549 m)     Constitutional: NAD, calm, comfortable Eyes: PERRL, lids and conjunctivae normal ENMT: Mucous membranes are moist. Posterior pharynx clear of any exudate or lesions. Neck: Normal, supple, no masses, no thyromegaly Respiratory: Decreased breath sounds in bases, otherwise clear to auscultation bilaterally, no wheezing, no crackles. Normal respiratory effort. No accessory muscle use.  Cardiovascular: Regular rate and rhythm, no murmurs / rubs / gallops.  Stage I lymphedema.  1+ lower extremity pitting edema. 2+ pedal pulses. No carotid bruits.  Abdomen: Nondistended. Bowel sounds positive. Soft, positive right lower quadrant tenderness, no guarding or rebound, no masses palpated. No hepatosplenomegaly. Musculoskeletal: no clubbing / cyanosis. .  Good ROM, no contractures. Normal muscle tone.  Skin: Draining furuncle with surrounding erythema, edema, calor, TTP and multiple areas of induration.  Please see pictures below. Neurologic: CN 2-12 grossly intact. Sensation intact, DTR normal. Strength 5/5 in all 4.  Psychiatric: Normal judgment and insight. Alert and oriented x 3. Normal mood.         Labs on Admission: I have personally reviewed following labs and imaging studies  CBC: Recent Labs  Lab 08/16/19 0126  WBC 5.2  NEUTROABS 2.9  HGB 11.5*  HCT 38.5  MCV 87.3  PLT 790   Basic Metabolic Panel: Recent Labs  Lab 08/16/19 0126  NA 139  K 3.7  CL 107  CO2 22  GLUCOSE 126*  BUN 17  CREATININE 0.88  CALCIUM 8.8*   GFR: Estimated Creatinine Clearance: 77.4 mL/min (by C-G formula based on SCr of 0.88 mg/dL). Liver Function Tests: Recent Labs  Lab 08/16/19 0126  AST 22  ALT 17  ALKPHOS 102  BILITOT 0.5  PROT 7.8  ALBUMIN 3.5   No results for input(s): LIPASE, AMYLASE in the last 168 hours. No results for input(s): AMMONIA in the last 168 hours. Coagulation Profile: No results for input(s): INR, PROTIME in the last 168 hours. Cardiac Enzymes: No results for input(s): CKTOTAL, CKMB, CKMBINDEX, TROPONINI in the last 168 hours. BNP (last 3 results) No results for input(s): PROBNP in the last 8760 hours. HbA1C: No results for input(s): HGBA1C in the last 72 hours. CBG: No results for input(s): GLUCAP in the last 168 hours. Lipid Profile: No results for input(s): CHOL, HDL, LDLCALC, TRIG, CHOLHDL, LDLDIRECT in the last 72 hours. Thyroid Function Tests: No results for input(s): TSH, T4TOTAL, FREET4, T3FREE, THYROIDAB in the last 72 hours. Anemia Panel: No results for input(s): VITAMINB12, FOLATE, FERRITIN, TIBC, IRON, RETICCTPCT in the last 72 hours. Urine analysis:    Component Value Date/Time   COLORURINE YELLOW 05/14/2018 [place] 05/14/2018 1535   LABSPEC >=1.030 (A)  05/14/2018 1535   PHURINE 6.0 05/14/2018 1535   GLUCOSEU NEGATIVE 05/14/2018 1535   HGBUR NEGATIVE 05/14/2018 1535   BILIRUBINUR NEGATIVE 05/14/2018 1535   KETONESUR NEGATIVE 05/14/2018 1535   PROTEINUR NEGATIVE 03/07/2008 1344   UROBILINOGEN 0.2 05/14/2018 1535   NITRITE NEGATIVE 05/14/2018 1535   LEUKOCYTESUR SMALL (A) 05/14/2018 1535   08/09/2019 wound culture  Susceptibility  Methicillin resistant staphylococcus aureus    AEROBIC CULT, GRAM STAIN POSITIVE 1    CIPROFLOXACIN >=8  Resistant    CLINDAMYCIN <=0.25  Sensitive    ERYTHROMYCIN >=8  Resistant    GENTAMICIN <=0.5  Sensitive    LEVOFLOXACIN 4  Resistant    OXACILLIN NR  Resistant1    TETRACYCLINE <=1  Sensitive    TRIMETH/SULFA >=320  Resistant2    VANCOMYCIN 1  Sensitive    Radiological Exams on Admission: No results found.  EKG: Independently reviewed.  Assessment/Plan Principal Problem:   Cellulitis of abdominal wall History of MRSA. Admit to MedSurg/inpatient. Continue local care. Analgesics as needed. Vancomycin per pharmacy.  Active Problems:   Essential hypertension Continue furosemide 20 mg p.o. bedtime. Continue losartan 100 mg p.o. daily. Monitor BP, renal function electrolytes.    GERD Protonix 40 mg p.o. daily.    Hepatic cirrhosis (HCC) History of hep C and hepatocellular CA. No signs of decompensation at this time. Monitor liver function tests.    Hyperlipidemia No medical treatment due to hepatic condition. Continue lifestyle modifications.    Anemia Monitor H&H. Check anemia panel.    Hyperglycemia Carbohydrate modified diet. CBG monitoring before meals and bedtime.    DVT prophylaxis: SCDs. Code Status: Full code. Family Communication: Disposition Plan: Admit for IV antibiotics for 2 to 3 days. Consults called: Admission status: Inpatient/MedSurg.   Reubin Milan MD [facility]  If 7PM-7AM, please contact  night-coverage [email or web address]  08/16/2019, 5:37 AM   This document was prepared using Dragon voice recognition software and may contain some unintended transcription errors.

## 2019-08-16 NOTE — ED Triage Notes (Addendum)
Patient here from home with complaints of abscess to right lower abd. Hx of same. Antibiotics with no relief. Reports area is hard and redden. Painful to area 10/10. Also states that she is under the treatment for MRSA to the area.

## 2019-08-16 NOTE — Progress Notes (Signed)
Pharmacy Antibiotic Note  Samantha [name] is a 68 y.o. female admitted on 08/16/2019 with cellulitis.  Pharmacy has been consulted for vancomycin dosing.  Plan: Vancomycin 2 Gm x1 then 1500 mg IV q24h for est AUC = 540 Use scr = 0.88, Vd= 0.5 Goal AUC = 400-550 F/u scr/cultures/levels  Height: 5\' 1"  (154.9 cm) Weight: 290 lb (131.5 kg) IBW/kg (Calculated) : 47.8  Temp (24hrs), Avg:98.3 F (36.8 C), Min:98.3 F (36.8 C), Max:98.3 F (36.8 C)  Recent Labs  Lab 08/16/19 0126  WBC 5.2  CREATININE 0.88  LATICACIDVEN 1.5    Estimated Creatinine Clearance: 77.4 mL/min (by C-G formula based on SCr of 0.88 mg/dL).    Allergies  Allergen Reactions  . Amlodipine Swelling and Other (See Comments)    Edema whole body, especially in feet  . Benazepril Hcl Cough  . Codeine Itching    Antimicrobials this admission: 8/18 vancomycin >>    >>   Dose adjustments this admission:   Microbiology results:  BCx:   UCx:    Sputum:    MRSA PCR:   Thank you for allowing pharmacy to be a part of this patient's care.  Dorrene German 08/16/2019 5:45 AM

## 2019-08-16 NOTE — ED Provider Notes (Signed)
[facility] DEPT Provider Note   CSN: [id number] Arrival date & time: 08/16/19  0043    History   Chief Complaint Chief Complaint  Patient presents with  . Abscess    HPI Chenel Wernli is a 68 y.o. female.     68 yo F with a chief complaints of cellulitis and abscess to her abdomen.  Patient has been seen multiple times by her family doctor over the course of the past 2 to 3 weeks.  She states that she has been on multiple rounds of antibiotics without any improvement.  Had fevers at the onset but no recent fever.  No nausea or vomiting.  She feels that the area of redness has spread somewhat significantly over the past few days.  Is currently taking clindamycin.  Has had some loose stools.    The history is provided by the patient.  Abscess Location:  Torso Torso abscess location:  Abd RLQ Abscess quality: induration, painful, redness and weeping   Red streaking: no   Duration:  3 weeks Progression:  Worsening Pain details:    Quality:  Aching and burning   Severity:  Mild   Duration:  3 weeks   Timing:  Constant   Progression:  Worsening Chronicity:  New Relieved by:  Nothing Worsened by:  Nothing Ineffective treatments:  None tried Associated symptoms: no fever, no headaches, no nausea and no vomiting     Past Medical History:  Diagnosis Date  . Allergy    seasonal  . Cirrhosis ([place]) 2018  . Diverticulosis   . Edema   . Elevated glucose    no longer an issue 03-18-16  . Fatty liver 2017  . GERD (gastroesophageal reflux disease)   . H/O seasonal allergies   . Hepatitis C    hepatitis c completed march 2017  . Hyperlipidemia 2011  . Hypertension   . LBP (low back pain)   . Liver cancer ([place]) 2017   IN REMISSION. WHEN IDAGNONED SIZE OF GRAPEFRUIT   . Morbid obesity ([place])   . Osteoarthritis    B knees  . Right middle lobe pulmonary nodule    resolved 2018  . Uterine fibroid   . Wears glasses     Patient Active  Problem List   Diagnosis Date Noted  . Abscess of skin of abdomen 08/09/2019  . Cellulitis of abdominal wall 08/05/2019  . Irregular Z line of esophagus   . Vagina bleeding 05/14/2018  . Gastritis   . Edema 04/14/2016  . Hepatic cirrhosis ([place]) 03/03/2016  . Hepatocellular carcinoma ([place]) 12/20/2015  . Chronic hepatitis C without hepatic coma ([place]) 12/05/2015  . Liver lesion, right lobe 12/05/2015  . Elevated LFTs 08/15/2015  . Screening for colon cancer   . History of colonic polyps   . Benign neoplasm of transverse colon   . Cystitis 08/04/2013  . Well adult exam 11/30/2012  . Fatty liver 03/23/2012  . Special screening for malignant neoplasms, colon 12/03/2011  . Benign neoplasm of colon 12/03/2011  . Diverticulosis of colon (without mention of hemorrhage) 12/03/2011  . 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 Bronchitis 11/25/2011  . Neck pain, chronic 08/22/2011  . COUGH 10/23/2010  . NONSPEC ELEVATION OF LEVELS OF TRANSAMINASE/LDH 09/23/2010  . WART, VIRAL 09/20/2010  . Hyperlipidemia 2011  . Anemia 2011  . OSTEOARTHRITIS 08/07/2009  . GERD 11/14/2008  . Low back pain 11/14/2008  . SINUSITIS, ACUTE 09/28/2008  . NEOPLASM OF UNCERTAIN BEHAVIOR OF SKIN 07/14/2008  .  OBESITY, MORBID 05/04/2008  . KNEE PAIN 05/04/2008  . Hyperglycemia 05/04/2008  . VARICOSE VEINS, LOWER EXTREMITIES 04/04/2008  . Essential hypertension 03/31/2008  . MUSCLE CRAMPS 03/31/2008  . URINARY FREQUENCY 03/31/2008    Past Surgical History:  Procedure Laterality Date  . BIOPSY  07/04/2019   Procedure: BIOPSY;  Surgeon: Jerene Bears, MD;  Location: [facility];  Service: Gastroenterology;;  . CHOLECYSTECTOMY    . COLONOSCOPY N/A 01/09/2015   Procedure: COLONOSCOPY;  Surgeon: Jerene Bears, MD;  Location: [facility];  Service: Gastroenterology;  Laterality: N/A;  . ESOPHAGOGASTRODUODENOSCOPY (EGD) WITH PROPOFOL N/A 06/10/2016   Procedure: ESOPHAGOGASTRODUODENOSCOPY (EGD) WITH PROPOFOL;  Surgeon: Jerene Bears, MD;  Location: [facility];  Service: Gastroenterology;  Laterality: N/A;  . ESOPHAGOGASTRODUODENOSCOPY (EGD) WITH PROPOFOL N/A 07/04/2019   Procedure: ESOPHAGOGASTRODUODENOSCOPY (EGD) WITH PROPOFOL;  Surgeon: Jerene Bears, MD;  Location: [facility];  Service: Gastroenterology;  Laterality: N/A;  . HYSTEROSCOPY W/D&C N/A 10/15/2018   Procedure: DILATATION AND CURETTAGE /HYSTEROSCOPY;  Surgeon: Aloha Gell, MD;  Location: [facility];  Service: Gynecology;  Laterality: N/A;  . IR GENERIC HISTORICAL  07/03/2016   IR RADIOLOGIST EVAL & MGMT 07/03/2016 [name], MD [facility]  . IR GENERIC HISTORICAL  11/18/2016   IR RADIOLOGIST EVAL & MGMT 11/18/2016 [name], MD [facility]  . IR RADIOLOGIST EVAL & MGMT  04/09/2017  . IR RADIOLOGIST EVAL & MGMT  11/05/2017  . JOINT REPLACEMENT     BILATERAL KNEES  . left shoulder arthroplasty    . right knee replacement  2007  . total knee replacement L  03-13-08  . TUBAL LIGATION       OB History   No obstetric history on file.      Home Medications    Prior to Admission medications   Medication Sig Start Date End Date Taking? Authorizing Provider  aspirin EC 81 MG tablet Take 81 mg by mouth daily.   Yes [provider]  BLACK COHOSH EXTRACT PO Take 1 tablet by mouth daily.   Yes [provider]  Cholecalciferol (EQL VITAMIN D3) 1000 UNITS tablet Take 1,000 Units by mouth daily.    Yes [provider]  clindamycin (CLEOCIN) 150 MG capsule Take 1 capsule (150 mg total) by mouth 3 (three) times daily for 7 days. 08/12/19 08/19/19 Yes Janith Lima, MD  furosemide (LASIX) 20 MG tablet Take 1-2 tablets (20-40 mg total) by mouth daily. Patient taking differently: Take 20 mg by mouth at bedtime.  06/13/19  Yes Plotnikov, Evie Lacks, MD  glucosamine-chondroitin 500-400 MG tablet Take 1 tablet by mouth daily.   Yes [provider]  loratadine (CLARITIN) 10 MG tablet Take 1 tablet by mouth  once daily 08/08/19  Yes Plotnikov, Evie Lacks, MD  losartan (COZAAR) 100 MG tablet Take 1 tablet (100 mg total) by mouth daily. 06/13/19  Yes Plotnikov, Evie Lacks, MD  meloxicam (MOBIC) 7.5 MG tablet TAKE 1 TO 2 TABLETS BY MOUTH ONCE DAILY AS NEEDED FOR PAIN Patient taking differently: Take 7.5-15 mg by mouth daily as needed (pain.).  06/13/19  Yes Plotnikov, Evie Lacks, MD  Multiple Vitamin (MULTIVITAMIN WITH MINERALS) TABS tablet Take 1 tablet by mouth daily.    Yes [provider]  naproxen sodium (ALEVE) 220 MG tablet Take 220 mg by mouth daily as needed (for pain.).   Yes [provider]  pantoprazole (PROTONIX) 40 MG tablet Take 1 tablet (40 mg total) by mouth daily. 06/13/19  Yes Plotnikov, Evie Lacks, MD  potassium chloride SA (K-DUR) 20 MEQ tablet Take 1 tablet (20 mEq total) by mouth daily. 06/13/19  Yes Plotnikov, Evie Lacks, MD  promethazine (PHENERGAN) 12.5 MG tablet Take 1 tablet (12.5 mg total) by mouth every 6 (six) hours as needed for nausea or vomiting. 08/09/19  Yes Janith Lima, MD  rifampin (RIFADIN) 150 MG capsule Take 1 capsule (150 mg total) by mouth 2 (two) times daily for 7 days. 08/09/19 08/16/19 Yes Janith Lima, MD  saccharomyces boulardii (FLORASTOR) 250 MG capsule Take 1 capsule (250 mg total) by mouth 2 (two) times daily. 11/24/18  Yes Plotnikov, Evie Lacks, MD  vitamin C (ASCORBIC ACID) 500 MG tablet Take 500 mg by mouth daily.   Yes [provider]    Family History Family History  Problem Relation Age of Onset  . Hypertension Mother   . Ovarian cancer Mother   . Hypertension Father   . Cancer Father 73       prostate cancer   . Diabetes Father   . Diabetes Other   . Hypertension Other   . Colon cancer Neg Hx   . Esophageal cancer Neg Hx   . Rectal cancer Neg Hx   . Stomach cancer Neg Hx   . Breast cancer Neg Hx     Social History Social History   Tobacco Use  . Smoking status: Former Smoker    Packs/day: 0.25    Years:  20.00    Pack years: 5.00    Quit date: 12/30/2007    Years since quitting: 11.6  . Smokeless tobacco: Never Used  Substance Use Topics  . 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 Drug use: No     Allergies   Amlodipine, Benazepril hcl, and Codeine   Review of Systems Review of Systems  Constitutional: Negative for chills and fever.  HENT: Negative for congestion and rhinorrhea.   Eyes: Negative for redness and visual disturbance.  Respiratory: Negative for shortness of breath and wheezing.   Cardiovascular: Negative for chest pain and palpitations.  Gastrointestinal: Negative for nausea and vomiting.  Genitourinary: Negative for dysuria and urgency.  Musculoskeletal: Negative for arthralgias and myalgias.  Skin: Positive for color change and wound. Negative for pallor.  Neurological: Negative for dizziness and headaches.     Physical Exam Updated Vital Signs BP (!) 146/111 (BP Location: Left Arm)   Pulse 77   Temp 98.3 F (36.8 C) (Oral)   Resp 17   Ht 5\' 1"  (1.549 m)   Wt 131.5 kg   SpO2 100%   BMI 54.80 kg/m   Physical Exam Vitals signs and nursing note reviewed.  Constitutional:      General: She is not in acute distress.    Appearance: She is well-developed. She is not diaphoretic.  HENT:     Head: Normocephalic and atraumatic.  Eyes:     Pupils: Pupils are equal, round, and reactive to light.  Neck:     Musculoskeletal: Normal range of motion and neck supple.  Cardiovascular:     Rate and Rhythm: Normal rate and regular rhythm.     Heart sounds: No murmur. No friction rub. No gallop.   Pulmonary:     Effort: Pulmonary effort is normal.     Breath sounds: No wheezing or rales.  Abdominal:     General: There is no distension.  Palpations: Abdomen is soft.     Tenderness: There is no abdominal tenderness.     Comments: Large amounts of induration and erythema to the right  lower portion of the abdomen.  There is a open ulceration.  Draining serous appearing fluid.  No apparent areas of fluctuance.  No overt abdominal pain around the induration.  Musculoskeletal:        General: No tenderness.  Skin:    General: Skin is warm and dry.  Neurological:     Mental Status: She is alert and oriented to person, place, and time.  Psychiatric:        Behavior: Behavior normal.      ED Treatments / Results  Labs (all labs ordered are listed, but only abnormal results are displayed) Labs Reviewed  COMPREHENSIVE METABOLIC PANEL - Abnormal; Notable for the following components:      Result Value   Glucose, Bld 126 (*)    Calcium 8.8 (*)    All other components within normal limits  CBC WITH DIFFERENTIAL/PLATELET - Abnormal; Notable for the following components:   Hemoglobin 11.5 (*)    MCHC 29.9 (*)    All other components within normal limits  CULTURE, BLOOD (ROUTINE X 2)  CULTURE, BLOOD (ROUTINE X 2)  SARS CORONAVIRUS 2  LACTIC ACID, PLASMA  LACTIC ACID, PLASMA    EKG None  Radiology No results found.  Procedures Procedures (including critical care time)  Medications Ordered in ED Medications  vancomycin (VANCOCIN) 2,000 mg in sodium chloride 0.9 % 500 mL IVPB (has no administration in time range)  vancomycin (VANCOCIN) 1,500 mg in sodium chloride 0.9 % 500 mL IVPB (has no administration in time range)  enoxaparin (LOVENOX) injection 40 mg (has no administration in time range)  acetaminophen (TYLENOL) tablet 650 mg (has no administration in time range)    Or  acetaminophen (TYLENOL) suppository 650 mg (has no administration in time range)  ondansetron (ZOFRAN) tablet 4 mg (has no administration in time range)    Or  ondansetron (ZOFRAN) injection 4 mg (has no administration in time range)     Initial Impression / Assessment and Plan / ED Course  I have reviewed the triage vital signs and the nursing notes.  Pertinent labs & imaging results  that were available during my care of the patient were reviewed by me and considered in my medical decision making (see chart for details).        68 yo F with a chief complaints of a abscess and cellulitis to her abdominal wall.  She has been seen multiple times by her PCP for this.  Was found to be MRSA on the most recent culture.  She was switched from Bactrim to clindamycin.  Since then has had worsening of the erythema.  I will treat her as a failure of outpatient therapy.  Started on IV clindamycin.  Will discuss with hospitalist.  No leukocytosis no fevers.   The patients results and plan were reviewed and discussed.   Any x-rays performed were independently reviewed by myself.   Differential diagnosis were considered with the presenting HPI.  Medications  vancomycin (VANCOCIN) 2,000 mg in sodium chloride 0.9 % 500 mL IVPB (has no administration in time range)  vancomycin (VANCOCIN) 1,500 mg in sodium chloride 0.9 % 500 mL IVPB (has no administration in time range)  enoxaparin (LOVENOX) injection 40 mg (has no administration in time range)  acetaminophen (TYLENOL) tablet 650 mg (has no administration in time range)  Or  acetaminophen (TYLENOL) suppository 650 mg (has no administration in time range)  ondansetron (ZOFRAN) tablet 4 mg (has no administration in time range)    Or  ondansetron (ZOFRAN) injection 4 mg (has no administration in time range)    Vitals:   08/16/19 0117 08/16/19 0315  BP: (!) 150/97 (!) 146/111  Pulse: 96 77  Resp: 20 17  Temp: 98.3 F (36.8 C)   TempSrc: Oral   SpO2: 99% 100%  Weight: 131.5 kg   Height: 5\' 1"  (1.549 m)     Final diagnoses:  Cellulitis and abscess of trunk    Admission/ observation were discussed with the admitting physician, patient and/or family and they are comfortable with the plan.    Final Clinical Impressions(s) / ED Diagnoses   Final diagnoses:  Cellulitis and abscess of trunk    ED Discharge Orders    None        Deno Etienne, DO 08/16/19 2883

## 2019-08-17 DIAGNOSIS — L0390 Cellulitis, unspecified: Secondary | ICD-10-CM

## 2019-08-17 DIAGNOSIS — B9562 Methicillin resistant Staphylococcus aureus infection as the cause of diseases classified elsewhere: Secondary | ICD-10-CM

## 2019-08-17 DIAGNOSIS — K7030 Alcoholic cirrhosis of liver without ascites: Secondary | ICD-10-CM

## 2019-08-17 LAB — COMPREHENSIVE METABOLIC PANEL
ALT: 15 U/L (ref 0–44)
AST: 19 U/L (ref 15–41)
Albumin: 3.2 g/dL — ABNORMAL LOW (ref 3.5–5.0)
Alkaline Phosphatase: 86 U/L (ref 38–126)
Anion gap: 6 (ref 5–15)
BUN: 17 mg/dL (ref 8–23)
CO2: 24 mmol/L (ref 22–32)
Calcium: 8.6 mg/dL — ABNORMAL LOW (ref 8.9–10.3)
Chloride: 108 mmol/L (ref 98–111)
Creatinine, Ser: 0.82 mg/dL (ref 0.44–1.00)
GFR calc Af Amer: >60 mL/min (ref >60)
GFR calc non Af Amer: >60 mL/min (ref >60)
Glucose, Bld: 102 mg/dL — ABNORMAL HIGH (ref 70–99)
Potassium: 4.2 mmol/L (ref 3.5–5.1)
Sodium: 138 mmol/L (ref 135–145)
Total Bilirubin: 0.3 mg/dL (ref 0.3–1.2)
Total Protein: 6.8 g/dL (ref 6.5–8.1)

## 2019-08-17 LAB — CBC
HCT: 36.3 % (ref 36.0–46.0)
Hemoglobin: 10.8 g/dL — ABNORMAL LOW (ref 12.0–15.0)
MCH: 26.2 pg (ref 26.0–34.0)
MCHC: 29.8 g/dL — ABNORMAL LOW (ref 30.0–36.0)
MCV: 88.1 fL (ref 80.0–100.0)
Platelets: 275 10*3/uL (ref 150–400)
RBC: 4.12 MIL/uL (ref 3.87–5.11)
RDW: 14.5 % (ref 11.5–15.5)
WBC: 3.7 10*3/uL — ABNORMAL LOW (ref 4.0–10.5)
nRBC: 0 % (ref 0.0–0.2)

## 2019-08-17 LAB — HIV ANTIBODY (ROUTINE TESTING W REFLEX): HIV Screen 4th Generation wRfx: NONREACTIVE

## 2019-08-17 LAB — VITAMIN B12: Vitamin B-12: 1665 pg/mL — ABNORMAL HIGH (ref 180–914)

## 2019-08-17 LAB — RETICULOCYTES
Immature Retic Fract: 14.3 % (ref 2.3–15.9)
RBC.: 4.12 MIL/uL (ref 3.87–5.11)
Retic Count, Absolute: 48.2 10*3/uL (ref 19.0–186.0)
Retic Ct Pct: 1.2 % (ref 0.4–3.1)

## 2019-08-17 LAB — IRON AND TIBC
Iron: 54 ug/dL (ref 28–170)
Saturation Ratios: 19 % (ref 10.4–31.8)
TIBC: 286 ug/dL (ref 250–450)
UIBC: 232 ug/dL

## 2019-08-17 LAB — FOLATE: Folate: 23.1 ng/mL (ref >5.9)

## 2019-08-17 LAB — FERRITIN: Ferritin: 86 ng/mL (ref 11–307)

## 2019-08-17 NOTE — Progress Notes (Signed)
Subjective No acute events. Reports pain much improved since IV Vancomycin was initiated. Known hx of MRSA as well. She overall feels much better as well  Objective: Vital signs in last 24 hours: Temp:  [98.3 F (36.8 C)-98.4 F (36.9 C)] 98.3 F (36.8 C) (08/19 0542) Pulse Rate:  [76-83] 76 (08/19 0542) Resp:  [16-20] 20 (08/19 0542) BP: (108-130)/(70-82) 130/82 (08/19 0542) SpO2:  [96 %-100 %] 96 % (08/19 0542) Last BM Date: 08/15/19  Intake/Output from previous day: 08/18 0701 - 08/19 0700 In: 360 [P.O.:360] Out: -  Intake/Output this shift: No intake/output data recorded.  Gen: NAD, comfortable CV: RRR Pulm: Normal work of breathing Abd: Obese, soft, induration/woody edema on low abdominal skin - no erythema - purple marking noted. No active purulent drainage at this time. No fluctuance. Ext: SCDs in place  Lab Results: CBC  Recent Labs    08/16/19 0126 08/17/19 0531  WBC 5.2 3.7*  HGB 11.5* 10.8*  HCT 38.5 36.3  PLT 260 275   BMET Recent Labs    08/16/19 0126 08/17/19 0531  NA 139 138  K 3.7 4.2  CL 107 108  CO2 22 24  GLUCOSE 126* 102*  BUN 17 17  CREATININE 0.88 0.82  CALCIUM 8.8* 8.6*   PT/INR No results for input(s): LABPROT, INR in the last 72 hours. ABG No results for input(s): PHART, HCO3 in the last 72 hours.  Invalid input(s): PCO2, PO2  Studies/Results:  Anti-infectives: Anti-infectives (From admission, onward)   Start     Dose/Rate Route Frequency Ordered Stop   08/17/19 0800  vancomycin (VANCOCIN) 1,500 mg in sodium chloride 0.9 % 500 mL IVPB     1,500 mg 250 mL/hr over 120 Minutes Intravenous Every 24 hours 08/16/19 0550     08/16/19 1000  rifampin (RIFADIN) capsule 150 mg  Status:  Discontinued     150 mg Oral 2 times daily 08/16/19 0804 08/16/19 1016   08/16/19 0545  vancomycin (VANCOCIN) IVPB 1000 mg/200 mL premix  Status:  Discontinued     1,000 mg 200 mL/hr over 60 Minutes Intravenous  Once 08/16/19 0534 08/16/19 0536   08/16/19 0545  vancomycin (VANCOCIN) 2,000 mg in sodium chloride 0.9 % 500 mL IVPB     2,000 mg 250 mL/hr over 120 Minutes Intravenous  Once 08/16/19 0537 08/16/19 0846   08/16/19 0530  clindamycin (CLEOCIN) IVPB 900 mg  Status:  Discontinued     900 mg 100 mL/hr over 30 Minutes Intravenous  Once 08/16/19 0522 08/16/19 0555       Assessment/Plan: Patient Active Problem List   Diagnosis Date Noted  . Abscess of skin of abdomen 08/09/2019  . Cellulitis of abdominal wall 08/05/2019  . Irregular Z line of esophagus   . Vagina bleeding 05/14/2018  . Gastritis   . Edema 04/14/2016  . Hepatic cirrhosis ([place]) 03/03/2016  . Hepatocellular carcinoma ([place]) 12/20/2015  . Chronic hepatitis C without hepatic coma ([place]) 12/05/2015  . Liver lesion, right lobe 12/05/2015  . Elevated LFTs 08/15/2015  . Screening for colon cancer   . History of colonic polyps   . Benign neoplasm of transverse colon   . Cystitis 08/04/2013  . Well adult exam 11/30/2012  . Fatty liver 03/23/2012  . Special screening for malignant neoplasms, colon 12/03/2011  . Benign neoplasm of colon 12/03/2011  . Diverticulosis of colon (without mention of hemorrhage) 12/03/2011  . 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 Bronchitis 11/25/2011  . Neck pain, chronic 08/22/2011  . COUGH  10/23/2010  . NONSPEC ELEVATION OF LEVELS OF TRANSAMINASE/LDH 09/23/2010  . WART, VIRAL 09/20/2010  . Hyperlipidemia 2011  . Anemia 2011  . OSTEOARTHRITIS 08/07/2009  . GERD 11/14/2008  . Low back pain 11/14/2008  . SINUSITIS, ACUTE 09/28/2008  . NEOPLASM OF UNCERTAIN BEHAVIOR OF SKIN 07/14/2008  . OBESITY, MORBID 05/04/2008  . KNEE PAIN 05/04/2008  . Hyperglycemia 05/04/2008  . VARICOSE VEINS, LOWER EXTREMITIES 04/04/2008  . Essential hypertension 03/31/2008  . MUSCLE CRAMPS 03/31/2008  . URINARY FREQUENCY 03/31/2008   Samantha [name] is a 60yoF with hx of cirrhosis, HCV, HTN, MRSA admitted with cellulitis of abdominal wall  -CT scan reviewed -  she has no clear abscess noted; changes consistent with cellulitis too. She does have hx of MRSA which would explain the skin changes and cellulitis she has. She is feeling much better and her pain is much improved on IV vanc. She likely has underlying MRSA cellulitis. Given her significant improvement on IV abx alone, this seems reasonable to continue and observe over next couple days - probability of a significant drainable collection that OR debridement would open up is low and would leave her with a large wound. She expressed understanding of all the above and agreement with plan moving forward.   LOS: 1 day   [name], M.D. [facility]

## 2019-08-17 NOTE — Progress Notes (Signed)
PROGRESS NOTE  Samantha [name] YKZ:[id number] DOB: 1951/10/17 DOA: 08/16/2019 PCP: Cassandria Anger, MD  Brief summary: Samantha [name] Lindsayis a 68 y.o.femalewith medical history significant ofseasonal allergy, liver cirrhosis, diverticulosis, edema, hyperglycemia, GERD, hepatitis C, hyperlipidemia, hypertension, morbid obesity, osteoarthritis, history of pulmonary nodule, uterine fibroid who is coming to the emergency department due to progressively worse right lower abdomen wound due to MRSA that has notimproved on oral clindamycin.She denies fever, but complains of fatigue, chills and profuse night sweats. No rhinorrhea, sore throat, wheezing, hemoptysis, chest pain, worsening dyspnea, palpitations,, but occasional dizziness and frequent lower extremities pitting edema. She has had some nausea since taking the clindamycin, but denies emesis, abdominal pain, diarrhea, constipation, melena or hematochezia. No dysuria, frequency or hematuria. Denies polyuria, polydipsia, polyphagia or blurred vision.  ED Course:She will vital signs temperature 98.3 F, pulse 96, respirations 20, blood pressure 150/97 mmHg and O2 sat 99% on room air.  CBC was 5.2, hemoglobin 11.5 g/dL and platelets 260. CMP shows normal values, except for a glucose of 126 and a calcium of 8.8 mg/dL (normal when corrected to albumin). Lactic acid was normal.     HPI/Recap of past 24 hours:  Abdominal wound is improving on iv vanc  she is getting dressing changed once a day  Assessment/Plan: Principal Problem:   Cellulitis of abdominal wall Active Problems:   OBESITY, MORBID   Essential hypertension   GERD   Hyperglycemia   Hepatic cirrhosis (HCC)   Hyperlipidemia   Anemia  MRSA cellulitis/draining abdominal wound Failed several oral meds and I/D in pcp 's office Improving on iv vanc General surgery following, input appreciated   08/09/2019 wound culture  Susceptibility          Methicillin resistant staphylococcus aureus    AEROBIC CULT, GRAM STAIN POSITIVE 1    CIPROFLOXACIN >=8  Resistant    CLINDAMYCIN <=0.25  Sensitive    ERYTHROMYCIN >=8  Resistant    GENTAMICIN <=0.5  Sensitive    LEVOFLOXACIN 4  Resistant    OXACILLIN NR  Resistant1    TETRACYCLINE <=1  Sensitive    TRIMETH/SULFA >=320  Resistant2    VANCOMYCIN 1  Sensitive      Hepatic cirrhosis (HCC) History of hep C and hepatocellular CA. No signs of decompensation at this time. Monitor liver function tests.  Hyperlipidemia No medical treatment due to hepatic condition. Continue lifestyle modifications.    Essential hypertension -Stable continue home medication: Continue furosemide 20 mg p.o. bedtime. Continue losartan 100 mg p.o. daily.   Class III obesity Body mass index is 54.8 kg/m.   DVT Prophylaxis: scd's   Code Status: full  Family Communication: patient   Disposition Plan: getting iv vanc, needs general surgery clearance   Consultants:  Wound care  General surgery  Procedures:  none  Antibiotics: Clindamycin IV eight 1820 x 1 dose. Vancomycin IV 08/16/2019 to current ongoing   Objective: BP 122/75 (BP Location: Left Arm)   Pulse 83   Temp 98.2 F (36.8 C) (Oral)   Resp 20   Ht 5\' 1"  (1.549 m)   Wt 131.5 kg   SpO2 100%   BMI 54.80 kg/m   Intake/Output Summary (Last 24 hours) at 08/17/2019 1617 Last data filed at 08/17/2019 1541 Gross per 24 hour  Intake 620 ml  Output -  Net 620 ml   Filed Weights   08/16/19 0117  Weight: 131.5 kg    Exam: Patient is examined daily including today on 08/17/2019, exams remain  the same as of yesterday except that has changed    General:  NAD, pleasant  Cardiovascular: RRR  Respiratory: CTABL  Abdomen: abdominal wound, erythema is receeding, draining wound with packing in, +positive BS  Musculoskeletal: No Edema  Neuro: alert, oriented   Data Reviewed: Basic Metabolic  Panel: Recent Labs  Lab 08/16/19 0126 08/17/19 0531  NA 139 138  K 3.7 4.2  CL 107 108  CO2 22 24  GLUCOSE 126* 102*  BUN 17 17  CREATININE 0.88 0.82  CALCIUM 8.8* 8.6*   Liver Function Tests: Recent Labs  Lab 08/16/19 0126 08/17/19 0531  AST 22 19  ALT 17 15  ALKPHOS 102 86  BILITOT 0.5 0.3  PROT 7.8 6.8  ALBUMIN 3.5 3.2*   No results for input(s): LIPASE, AMYLASE in the last 168 hours. No results for input(s): AMMONIA in the last 168 hours. CBC: Recent Labs  Lab 08/16/19 0126 08/17/19 0531  WBC 5.2 3.7*  NEUTROABS 2.9  --   HGB 11.5* 10.8*  HCT 38.5 36.3  MCV 87.3 88.1  PLT 260 275   Cardiac Enzymes:   No results for input(s): CKTOTAL, CKMB, CKMBINDEX, TROPONINI in the last 168 hours. BNP (last 3 results) No results for input(s): BNP in the last 8760 hours.  ProBNP (last 3 results) No results for input(s): PROBNP in the last 8760 hours.  CBG: No results for input(s): GLUCAP in the last 168 hours.  Recent Results (from the past 240 hour(s))  WOUND CULTURE     Status: Abnormal   Collection Time: 08/09/19  3:48 PM   Specimen: Wound  Result Value Ref Range Status   MICRO NUMBER: [id number]  Final   SPECIMEN QUALITY: Adequate  Final   SOURCE: RLQ ABD WALL ABSCESS  Final   STATUS: FINAL  Final   GRAM STAIN:   Final    Moderate White blood cells seen No epithelial cells seen No organisms seen   ISOLATE 1: methicillin resistant Staphylococcus aureus (A)  Final    Comment: Moderate growth of Methicillin resistant Staphylococcus aureus (MRSA) Negative for inducible clindamycin resistance.      Susceptibility   Methicillin resistant staphylococcus aureus - AEROBIC CULT, GRAM STAIN POSITIVE 1    VANCOMYCIN 1 Sensitive     CIPROFLOXACIN >=8 Resistant     CLINDAMYCIN <=0.25 Sensitive     LEVOFLOXACIN 4 Resistant     ERYTHROMYCIN >=8 Resistant     GENTAMICIN <=0.5 Sensitive     OXACILLIN* NR Resistant      * Oxacillin-resistant staphylococci are resistant  toall currently available beta-lactam antimicrobialagents including penicillins, beta lactam/beta-lactamase inhibitor combinations, and cephems withstaphylococcal indications, including Cefazolin.    TETRACYCLINE <=1 Sensitive     TRIMETH/SULFA* >=320 Resistant      * Oxacillin-resistant staphylococci are resistant toall currently available beta-lactam antimicrobialagents including penicillins, beta lactam/beta-lactamase inhibitor combinations, and cephems withstaphylococcal indications, including Cefazolin.Legend:S = Susceptible  I = IntermediateR = Resistant  NS = Not susceptible* = Not tested  NR = Not reported**NN = See antimicrobic comments  Blood culture (routine x 2)     Status: None (Preliminary result)   Collection Time: 08/16/19  6:00 AM   Specimen: BLOOD  Result Value Ref Range Status   Specimen Description   Final    BLOOD RIGHT ARM Performed at [facility] [address], [place],  [address]    Special Requests   Final    BOTTLES DRAWN AEROBIC AND ANAEROBIC Blood Culture adequate volume Performed  at [facility], [place] [address] [place], [place] [address]    Culture   Final    NO GROWTH < 24 HOURS Performed at [facility] [address] [place], [place] [address]    Report Status PENDING  Incomplete  SARS CORONAVIRUS 2 Nasal Swab Aptima Multi Swab     Status: None   Collection Time: 08/16/19  6:04 AM   Specimen: Aptima Multi Swab; Nasal Swab  Result Value Ref Range Status   SARS Coronavirus 2 NEGATIVE NEGATIVE Final    Comment: (NOTE) SARS-CoV-2 target nucleic acids are NOT DETECTED. The SARS-CoV-2 RNA is generally detectable in upper and lower respiratory specimens during the acute phase of infection. Negative results do not preclude SARS-CoV-2 infection, do not rule out co-infections with other pathogens, and should not be used as the sole basis for treatment or other patient management decisions. Negative results  must be combined with clinical observations, patient history, and epidemiological information. The expected result is Negative. Fact Sheet for Patients: SugarRoll.be Fact Sheet for Healthcare Providers: https://www.woods-mathews.com/ This test is not yet approved or cleared by the [place] FDA and  has been authorized for detection and/or diagnosis of SARS-CoV-2 by FDA under an Emergency Use Authorization (EUA). This EUA will remain  in effect (meaning this test can be used) for the duration of the COVID-19 declaration under Section 56 4(b)(1) of the Act, 21 U.S.C. section 360bbb-3(b)(1), unless the authorization is terminated or revoked sooner. Performed at [facility], [place] [address], [place], [place] [address]   Aerobic/Anaerobic Culture (surgical/deep wound)     Status: None (Preliminary result)   Collection Time: 08/16/19  2:09 PM   Specimen: Abdomen; Abscess  Result Value Ref Range Status   Specimen Description   Final    ABDOMEN Performed at [facility] [address], [place], [place] [address]    Special Requests   Final    NONE Performed at [facility], [place] [address], [place], [place] [address]    Gram Stain   Final    RARE WBC PRESENT, PREDOMINANTLY PMN NO ORGANISMS SEEN    Culture   Final    NO GROWTH < 24 HOURS Performed at [facility] [address] [place], [place] [address]    Report Status PENDING  Incomplete     Studies: Ct Abdomen Pelvis Wo Contrast  Result Date: 08/16/2019 CLINICAL DATA:  Abdominal rigidity.  Concern for abdominal abscess. EXAM: CT ABDOMEN AND PELVIS WITHOUT CONTRAST TECHNIQUE: Multidetector CT imaging of the abdomen and pelvis was performed following the standard protocol without IV contrast. COMPARISON:  MRI dated February 28, 2019 FINDINGS: Lower chest: The lung bases are clear. The heart size is normal. Hepatobiliary: There is residual  lipiodol the segment 8 tumor currently measuring approximately 2.7 cm. Detection of new tumors is limited by lack of IV contrast. The liver contour somewhat nodular concerning for underlying cirrhosis. The gallbladder is not reliably visualized and may be surgically absent. Pancreas: Normal contours without ductal dilatation. No peripancreatic fluid collection. Spleen: No splenic laceration or hematoma. Adrenals/Urinary Tract: --Adrenal glands: No adrenal hemorrhage. --Right kidney/ureter: No hydronephrosis or perinephric hematoma. --Left kidney/ureter: No hydronephrosis or perinephric hematoma. --Urinary bladder: Unremarkable. Stomach/Bowel: --Stomach/Duodenum: No hiatal hernia or other gastric abnormality. Normal duodenal course and caliber. --Small bowel: No dilatation or inflammation. --Colon: There is severe sigmoid diverticulosis without CT evidence for diverticulitis. --Appendix: Normal. Vascular/Lymphatic: Atherosclerotic calcification is present within the  non-aneurysmal abdominal aorta, without hemodynamically significant stenosis. --No retroperitoneal lymphadenopathy. --No mesenteric lymphadenopathy. --No pelvic or inguinal lymphadenopathy. Reproductive: There is a fibroid uterus. Other: No ascites or free air. There is subcutaneous fat stranding and skin thickening involving the low anterior abdominal wall. There is no fluid collection. Musculoskeletal. There are degenerative changes throughout the visualized lumbar spine. IMPRESSION: 1. There is subcutaneous fat stranding with overlying skin thickening involving the low anterior abdominal wall. This is concerning for cellulitis in the appropriate clinical setting. There is no drainable fluid collection or abscess. 2. No other acute abnormality detected. 3. Additional chronic findings as above. Electronically Signed   By: Constance Holster M.D.   On: 08/16/2019 19:11    Scheduled Meds: . aspirin EC  81 mg Oral Daily  . furosemide  20 mg Oral QHS   . loratadine  10 mg Oral Daily  . losartan  100 mg Oral Daily  . saccharomyces boulardii  250 mg Oral BID  . vitamin C  500 mg Oral Daily    Continuous Infusions: . vancomycin Stopped (08/17/19 1056)     Time spent: 64mins I have personally reviewed and interpreted on  08/17/2019 daily labs, imagings as discussed above under date review session and assessment and plans.  I reviewed all nursing notes, pharmacy notes, consultant notes,  vitals, pertinent old records  I have discussed plan of care as described above with RN , patient  on 08/17/2019   Florencia Reasons MD, PhD, FACP  [facility] Pager [phone]. If 7PM-7AM, please contact night-coverage at [email or web address], password [facility] 08/17/2019, 4:17 PM  LOS: 1 day

## 2019-08-18 LAB — CBC WITH DIFFERENTIAL/PLATELET
Abs Immature Granulocytes: 0.02 10*3/uL (ref 0.00–0.07)
Basophils Absolute: 0 10*3/uL (ref 0.0–0.1)
Basophils Relative: 1 %
Eosinophils Absolute: 0.1 10*3/uL (ref 0.0–0.5)
Eosinophils Relative: 4 %
HCT: 38.7 % (ref 36.0–46.0)
Hemoglobin: 11.4 g/dL — ABNORMAL LOW (ref 12.0–15.0)
Immature Granulocytes: 1 %
Lymphocytes Relative: 34 %
Lymphs Abs: 1.1 10*3/uL (ref 0.7–4.0)
MCH: 25.9 pg — ABNORMAL LOW (ref 26.0–34.0)
MCHC: 29.5 g/dL — ABNORMAL LOW (ref 30.0–36.0)
MCV: 87.8 fL (ref 80.0–100.0)
Monocytes Absolute: 0.4 10*3/uL (ref 0.1–1.0)
Monocytes Relative: 11 %
Neutro Abs: 1.6 10*3/uL — ABNORMAL LOW (ref 1.7–7.7)
Neutrophils Relative %: 49 %
Platelets: 266 10*3/uL (ref 150–400)
RBC: 4.41 MIL/uL (ref 3.87–5.11)
RDW: 14.5 % (ref 11.5–15.5)
WBC: 3.3 10*3/uL — ABNORMAL LOW (ref 4.0–10.5)
nRBC: 0 % (ref 0.0–0.2)

## 2019-08-18 LAB — CREATININE, SERUM
Creatinine, Ser: 0.86 mg/dL (ref 0.44–1.00)
GFR calc Af Amer: >60 mL/min (ref >60)
GFR calc non Af Amer: >60 mL/min (ref >60)

## 2019-08-18 LAB — BASIC METABOLIC PANEL
Anion gap: 6 (ref 5–15)
BUN: 16 mg/dL (ref 8–23)
CO2: 24 mmol/L (ref 22–32)
Calcium: 8.7 mg/dL — ABNORMAL LOW (ref 8.9–10.3)
Chloride: 107 mmol/L (ref 98–111)
Creatinine, Ser: 0.83 mg/dL (ref 0.44–1.00)
GFR calc Af Amer: >60 mL/min (ref >60)
GFR calc non Af Amer: >60 mL/min (ref >60)
Glucose, Bld: 107 mg/dL — ABNORMAL HIGH (ref 70–99)
Potassium: 4 mmol/L (ref 3.5–5.1)
Sodium: 137 mmol/L (ref 135–145)

## 2019-08-18 LAB — HEMOGLOBIN A1C
Hgb A1c MFr Bld: 6 % — ABNORMAL HIGH (ref 4.8–5.6)
Mean Plasma Glucose: 125.5 mg/dL

## 2019-08-18 NOTE — Progress Notes (Signed)
Patient ID: Samantha [name], female   DOB: Dec 29, 1951, 68 y.o.   MRN: [id number]       Subjective: Feels much better.  Objective: Vital signs in last 24 hours: Temp:  [98.2 F (36.8 C)-98.4 F (36.9 C)] 98.2 F (36.8 C) (08/20 0600) Pulse Rate:  [77-83] 77 (08/20 0600) Resp:  [16-20] 16 (08/20 0600) BP: (122-130)/(72-77) 130/77 (08/20 0600) SpO2:  [96 %-100 %] 96 % (08/20 0600) Last BM Date: 08/15/19  Intake/Output from previous day: 08/19 0701 - 08/20 0700 In: 860 [P.O.:360; IV Piggyback:500] Out: -  Intake/Output this shift: No intake/output data recorded.  PE: Abd: soft, obese, site around wound is still indurated as expected, but significantly less tender, wound is clean with minimal drainage.  No cellulitis  Lab Results:  Recent Labs    08/17/19 0531 08/18/19 0537  WBC 3.7* 3.3*  HGB 10.8* 11.4*  HCT 36.3 38.7  PLT 275 266   BMET Recent Labs    08/17/19 0531 08/18/19 0010 08/18/19 0537  NA 138  --  137  K 4.2  --  4.0  CL 108  --  107  CO2 24  --  24  GLUCOSE 102*  --  107*  BUN 17  --  16  CREATININE 0.82 0.86 0.83  CALCIUM 8.6*  --  8.7*   PT/INR No results for input(s): LABPROT, INR in the last 72 hours. CMP     Component Value Date/Time   NA 137 08/18/2019 0537   K 4.0 08/18/2019 0537   CL 107 08/18/2019 0537   CO2 24 08/18/2019 0537   GLUCOSE 107 (H) 08/18/2019 0537   BUN 16 08/18/2019 0537   CREATININE 0.83 08/18/2019 0537   CREATININE 0.85 05/27/2018 1531   CALCIUM 8.7 (L) 08/18/2019 0537   PROT 6.8 08/17/2019 0531   ALBUMIN 3.2 (L) 08/17/2019 0531   AST 19 08/17/2019 0531   ALT 15 08/17/2019 0531   ALKPHOS 86 08/17/2019 0531   BILITOT 0.3 08/17/2019 0531   GFRNONAA >60 08/18/2019 0537   GFRNONAA 70 05/27/2018 1531   GFRAA >60 08/18/2019 0537   GFRAA 82 05/27/2018 1531   Lipase  No results found for: LIPASE     Studies/Results: Ct Abdomen Pelvis Wo Contrast  Result Date: 08/16/2019 CLINICAL DATA:  Abdominal  rigidity.  Concern for abdominal abscess. EXAM: CT ABDOMEN AND PELVIS WITHOUT CONTRAST TECHNIQUE: Multidetector CT imaging of the abdomen and pelvis was performed following the standard protocol without IV contrast. COMPARISON:  MRI dated February 28, 2019 FINDINGS: Lower chest: The lung bases are clear. The heart size is normal. 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 Detection of new tumors is limited by lack of IV contrast. The liver contour somewhat nodular concerning for underlying cirrhosis. The gallbladder is not reliably visualized and may be surgically absent. Pancreas: Normal contours without ductal dilatation. No peripancreatic fluid collection. Spleen: No splenic laceration or hematoma. Adrenals/Urinary Tract: --Adrenal glands: No adrenal hemorrhage. --Right kidney/ureter: No hydronephrosis or perinephric hematoma. --Left kidney/ureter: No hydronephrosis or perinephric hematoma. --Urinary bladder: Unremarkable. Stomach/Bowel: --Stomach/Duodenum: No hiatal hernia or other gastric abnormality. Normal duodenal course and caliber. --Small bowel: No dilatation or inflammation. --Colon: There is severe sigmoid diverticulosis without CT evidence for diverticulitis. --Appendix: Normal. Vascular/Lymphatic: Atherosclerotic calcification is present within the non-aneurysmal abdominal aorta, without hemodynamically significant stenosis. --No retroperitoneal lymphadenopathy. --No mesenteric lymphadenopathy. --No pelvic or inguinal lymphadenopathy. Reproductive: There is a fibroid uterus. Other: No ascites or free  air. There is subcutaneous fat stranding and skin thickening involving the low anterior abdominal wall. There is no fluid collection. Musculoskeletal. There are degenerative changes throughout the visualized lumbar spine. IMPRESSION: 1. There is subcutaneous fat stranding with overlying skin thickening involving the low anterior abdominal wall. This  is concerning for cellulitis in the appropriate clinical setting. There is no drainable fluid collection or abscess. 2. No other acute abnormality detected. 3. Additional chronic findings as above. Electronically Signed   By: Constance Holster M.D.   On: 08/16/2019 19:11    Anti-infectives: Anti-infectives (From admission, onward)   Start     Dose/Rate Route Frequency Ordered Stop   08/17/19 0800  vancomycin (VANCOCIN) 1,500 mg in sodium chloride 0.9 % 500 mL IVPB     1,500 mg 250 mL/hr over 120 Minutes Intravenous Every 24 hours 08/16/19 0550     08/16/19 1000  rifampin (RIFADIN) capsule 150 mg  Status:  Discontinued     150 mg Oral 2 times daily 08/16/19 0804 08/16/19 1016   08/16/19 0545  vancomycin (VANCOCIN) IVPB 1000 mg/200 mL premix  Status:  Discontinued     1,000 mg 200 mL/hr over 60 Minutes Intravenous  Once 08/16/19 0534 08/16/19 0536   08/16/19 0545  vancomycin (VANCOCIN) 2,000 mg in sodium chloride 0.9 % 500 mL IVPB     2,000 mg 250 mL/hr over 120 Minutes Intravenous  Once 08/16/19 0537 08/16/19 0846   08/16/19 0530  clindamycin (CLEOCIN) IVPB 900 mg  Status:  Discontinued     900 mg 100 mL/hr over 30 Minutes Intravenous  Once 08/16/19 0522 08/16/19 0555       Assessment/Plan Severely obese (BMI 54.8) History of hepatocellular carcinoma, status post intervention now in remission Cirrhosis Hepatitis C Hypertension  Abdominal wall abscess (MRSA) -patient improving.  No plans for surgical intervention -will defer further care to medicine at this time regarding abx therapy. -discussed with patient that MRSA typically presents like this with induration and that can take weeks to a month or so for that hardness to completely go away.  She understands.  FEN -regular diet VTE -okay for chemical prophylaxis from our standpoint ID -vancomycin  We will sign off.  Call as needed.  May follow up with her PCP   LOS: 2 days    Henreitta Cea , [facility]  Surgery 08/18/2019, 9:50 AM Pager: [phone]

## 2019-08-18 NOTE — Progress Notes (Signed)
PROGRESS NOTE  Samantha [name] YJE:[id number] DOB: Aug 26, 1951 DOA: 08/16/2019 PCP: Cassandria Anger, MD  Brief summary: Samantha Phillis Lindsayis a 68 y.o.femalewith medical history significant ofseasonal allergy, liver cirrhosis, diverticulosis, edema, hyperglycemia, GERD, hepatitis C, hyperlipidemia, hypertension, morbid obesity, osteoarthritis, history of pulmonary nodule, uterine fibroid who is coming to the emergency department due to progressively worse right lower abdomen wound due to MRSA that has notimproved on oral clindamycin.She denies fever, but complains of fatigue, chills and profuse night sweats. No rhinorrhea, sore throat, wheezing, hemoptysis, chest pain, worsening dyspnea, palpitations,, but occasional dizziness and frequent lower extremities pitting edema. She has had some nausea since taking the clindamycin, but denies emesis, abdominal pain, diarrhea, constipation, melena or hematochezia. No dysuria, frequency or hematuria. Denies polyuria, polydipsia, polyphagia or blurred vision.  ED Course:She will vital signs temperature 98.3 F, pulse 96, respirations 20, blood pressure 150/97 mmHg and O2 sat 99% on room air.  CBC was 5.2, hemoglobin 11.5 g/dL and platelets 260. CMP shows normal values, except for a glucose of 126 and a calcium of 8.8 mg/dL (normal when corrected to albumin). Lactic acid was normal.     HPI/Recap of past 24 hours:  Abdominal wound is improving on iv vanc she is getting dressing changed once a day  No fever, less pain, less drainage  Assessment/Plan: Principal Problem:   Cellulitis of abdominal wall Active Problems:   OBESITY, MORBID   Essential hypertension   GERD   Hyperglycemia   Hepatic cirrhosis (HCC)   Hyperlipidemia   Anemia   MRSA cellulitis  MRSA cellulitis/draining abdominal wound Failed several oral meds and I/D in pcp 's office Improving on iv vanc General surgery following, input  appreciated   08/09/2019 wound culture  Susceptibility         Methicillin resistant staphylococcus aureus    AEROBIC CULT, GRAM STAIN POSITIVE 1    CIPROFLOXACIN >=8  Resistant    CLINDAMYCIN <=0.25  Sensitive    ERYTHROMYCIN >=8  Resistant    GENTAMICIN <=0.5  Sensitive    LEVOFLOXACIN 4  Resistant    OXACILLIN NR  Resistant1    TETRACYCLINE <=1  Sensitive    TRIMETH/SULFA >=320  Resistant2    VANCOMYCIN 1  Sensitive      Hepatic cirrhosis (HCC) History of hep C and hepatocellular CA. No signs of decompensation at this time. Monitor liver function tests.  Hyperlipidemia No medical treatment due to hepatic condition. Continue lifestyle modifications.    Essential hypertension -Stable continue home medication: Continue furosemide 20 mg p.o. bedtime. Continue losartan 100 mg p.o. daily.   Class III obesity Body mass index is 54.8 kg/m.   DVT Prophylaxis: scd's   Code Status: full  Family Communication: patient   Disposition Plan: getting iv vanc, needs general surgery clearance   Consultants:  Wound care  General surgery  Procedures:  none  Antibiotics: Clindamycin IV eight 1820 x 1 dose. Vancomycin IV 08/16/2019 to current ongoing   Objective: BP 130/77 (BP Location: Left Arm)   Pulse 77   Temp 98.2 F (36.8 C) (Oral)   Resp 16   Ht 5\' 1"  (1.549 m)   Wt 131.5 kg   SpO2 96%   BMI 54.80 kg/m   Intake/Output Summary (Last 24 hours) at 08/18/2019 1320 Last data filed at 08/17/2019 1541 Gross per 24 hour  Intake 620 ml  Output -  Net 620 ml   Filed Weights   08/16/19 0117  Weight: 131.5 kg    Exam:  Patient is examined daily including today on 08/18/2019, exams remain the same as of yesterday except that has changed    General:  NAD, pleasant  Cardiovascular: RRR  Respiratory: CTABL  Abdomen: abdominal wound, erythema is receeding, draining wound with packing in, +positive  BS  Musculoskeletal: No Edema  Neuro: alert, oriented   Data Reviewed: Basic Metabolic Panel: Recent Labs  Lab 08/16/19 0126 08/17/19 0531 08/18/19 0010 08/18/19 0537  NA 139 138  --  137  K 3.7 4.2  --  4.0  CL 107 108  --  107  CO2 22 24  --  24  GLUCOSE 126* 102*  --  107*  BUN 17 17  --  16  CREATININE 0.88 0.82 0.86 0.83  CALCIUM 8.8* 8.6*  --  8.7*   Liver Function Tests: Recent Labs  Lab 08/16/19 0126 08/17/19 0531  AST 22 19  ALT 17 15  ALKPHOS 102 86  BILITOT 0.5 0.3  PROT 7.8 6.8  ALBUMIN 3.5 3.2*   No results for input(s): LIPASE, AMYLASE in the last 168 hours. No results for input(s): AMMONIA in the last 168 hours. CBC: Recent Labs  Lab 08/16/19 0126 08/17/19 0531 08/18/19 0537  WBC 5.2 3.7* 3.3*  NEUTROABS 2.9  --  1.6*  HGB 11.5* 10.8* 11.4*  HCT 38.5 36.3 38.7  MCV 87.3 88.1 87.8  PLT 260 275 266   Cardiac Enzymes:   No results for input(s): CKTOTAL, CKMB, CKMBINDEX, TROPONINI in the last 168 hours. BNP (last 3 results) No results for input(s): BNP in the last 8760 hours.  ProBNP (last 3 results) No results for input(s): PROBNP in the last 8760 hours.  CBG: No results for input(s): GLUCAP in the last 168 hours.  Recent Results (from the past 240 hour(s))  WOUND CULTURE     Status: Abnormal   Collection Time: 08/09/19  3:48 PM   Specimen: Wound  Result Value Ref Range Status   MICRO NUMBER: [id number]  Final   SPECIMEN QUALITY: Adequate  Final   SOURCE: RLQ ABD WALL ABSCESS  Final   STATUS: FINAL  Final   GRAM STAIN:   Final    Moderate White blood cells seen No epithelial cells seen No organisms seen   ISOLATE 1: methicillin resistant Staphylococcus aureus (A)  Final    Comment: Moderate growth of Methicillin resistant Staphylococcus aureus (MRSA) Negative for inducible clindamycin resistance.      Susceptibility   Methicillin resistant staphylococcus aureus - AEROBIC CULT, GRAM STAIN POSITIVE 1    VANCOMYCIN 1 Sensitive      CIPROFLOXACIN >=8 Resistant     CLINDAMYCIN <=0.25 Sensitive     LEVOFLOXACIN 4 Resistant     ERYTHROMYCIN >=8 Resistant     GENTAMICIN <=0.5 Sensitive     OXACILLIN* NR Resistant      * Oxacillin-resistant staphylococci are resistant toall currently available beta-lactam antimicrobialagents including penicillins, beta lactam/beta-lactamase inhibitor combinations, and cephems withstaphylococcal indications, including Cefazolin.    TETRACYCLINE <=1 Sensitive     TRIMETH/SULFA* >=320 Resistant      * Oxacillin-resistant staphylococci are resistant toall currently available beta-lactam antimicrobialagents including penicillins, beta lactam/beta-lactamase inhibitor combinations, and cephems withstaphylococcal indications, including Cefazolin.Legend:S = Susceptible  I = IntermediateR = Resistant  NS = Not susceptible* = Not tested  NR = Not reported**NN = See antimicrobic comments  Blood culture (routine x 2)     Status: None (Preliminary result)   Collection Time: 08/16/19  6:00 AM   Specimen: BLOOD  Result Value Ref Range Status   Specimen Description   Final    BLOOD RIGHT ARM Performed at [facility] [address], [place], [place] [address]    Special Requests   Final    BOTTLES DRAWN AEROBIC AND ANAEROBIC Blood Culture adequate volume Performed at [facility] [address] [place], [place] [address]    Culture   Final    NO GROWTH 2 DAYS Performed at [facility] [address], [place], [place] [address]    Report Status PENDING  Incomplete  SARS CORONAVIRUS 2 Nasal Swab Aptima Multi Swab     Status: None   Collection Time: 08/16/19  6:04 AM   Specimen: Aptima Multi Swab; Nasal Swab  Result Value Ref Range Status   SARS Coronavirus 2 NEGATIVE NEGATIVE Final    Comment: (NOTE) SARS-CoV-2 target nucleic acids are NOT DETECTED. The SARS-CoV-2 RNA is generally detectable in upper and lower respiratory specimens during the acute  phase of infection. Negative results do not preclude SARS-CoV-2 infection, do not rule out co-infections with other pathogens, and should not be used as the sole basis for treatment or other patient management decisions. Negative results must be combined with clinical observations, patient history, and epidemiological information. The expected result is Negative. Fact Sheet for Patients: SugarRoll.be Fact Sheet for Healthcare Providers: https://www.woods-mathews.com/ This test is not yet approved or cleared by the [place] FDA and  has been authorized for detection and/or diagnosis of SARS-CoV-2 by FDA under an Emergency Use Authorization (EUA). This EUA will remain  in effect (meaning this test can be used) for the duration of the COVID-19 declaration under Section 56 4(b)(1) of the Act, 21 U.S.C. section 360bbb-3(b)(1), unless the authorization is terminated or revoked sooner. Performed at [facility], [place] [address] [place], [place] [address]   Aerobic/Anaerobic Culture (surgical/deep wound)     Status: None (Preliminary result)   Collection Time: 08/16/19  2:09 PM   Specimen: Abdomen; Abscess  Result Value Ref Range Status   Specimen Description   Final    ABDOMEN Performed at [facility] [address] [place] [address] [place], [place] [address]    Special Requests   Final    NONE Performed at [facility], [place] [address], [place], [place] [address]    Gram Stain   Final    RARE WBC PRESENT, PREDOMINANTLY PMN NO ORGANISMS SEEN Performed at [facility], [place] [address], [place], [place] [address]    Culture   Final    CULTURE REINCUBATED FOR BETTER GROWTH NO ANAEROBES ISOLATED; CULTURE IN PROGRESS FOR 5 DAYS    Report Status PENDING  Incomplete     Studies: No results found.  Scheduled Meds: . aspirin EC  81 mg Oral Daily  . furosemide  20 mg Oral QHS  . loratadine  10 mg Oral  Daily  . losartan  100 mg Oral Daily  . saccharomyces boulardii  250 mg Oral BID  . vitamin C  500 mg Oral Daily    Continuous Infusions: . vancomycin 1,500 mg (08/18/19 0934)     Time spent: 35mins I have personally reviewed and interpreted on  08/18/2019 daily labs, imagings as discussed above under date review session and assessment and plans.  I reviewed all nursing notes, pharmacy notes, consultant notes,  vitals, pertinent old records  I have discussed plan of care as described above with RN , patient  on 08/18/2019  Florencia Reasons MD, PhD, [place]  [facility] Pager [phone]. If 7PM-7AM, please contact night-coverage at [email or web address], password [facility] 08/18/2019, 1:20 PM  LOS: 2 days

## 2019-08-19 ENCOUNTER — Telehealth: Payer: Self-pay | Admitting: *Deleted

## 2019-08-19 ENCOUNTER — Telehealth: Payer: Self-pay

## 2019-08-19 LAB — MAGNESIUM: Magnesium: 1.9 mg/dL (ref 1.7–2.4)

## 2019-08-19 LAB — CBC WITH DIFFERENTIAL/PLATELET
Abs Immature Granulocytes: 0.01 10*3/uL (ref 0.00–0.07)
Basophils Absolute: 0 10*3/uL (ref 0.0–0.1)
Basophils Relative: 1 %
Eosinophils Absolute: 0.2 10*3/uL (ref 0.0–0.5)
Eosinophils Relative: 3 %
HCT: 37.9 % (ref 36.0–46.0)
Hemoglobin: 11.4 g/dL — ABNORMAL LOW (ref 12.0–15.0)
Immature Granulocytes: 0 %
Lymphocytes Relative: 26 %
Lymphs Abs: 1.2 10*3/uL (ref 0.7–4.0)
MCH: 26.1 pg (ref 26.0–34.0)
MCHC: 30.1 g/dL (ref 30.0–36.0)
MCV: 86.9 fL (ref 80.0–100.0)
Monocytes Absolute: 0.4 10*3/uL (ref 0.1–1.0)
Monocytes Relative: 9 %
Neutro Abs: 2.9 10*3/uL (ref 1.7–7.7)
Neutrophils Relative %: 61 %
Platelets: 276 10*3/uL (ref 150–400)
RBC: 4.36 MIL/uL (ref 3.87–5.11)
RDW: 14.4 % (ref 11.5–15.5)
WBC: 4.8 10*3/uL (ref 4.0–10.5)
nRBC: 0 % (ref 0.0–0.2)

## 2019-08-19 LAB — BASIC METABOLIC PANEL
Anion gap: 9 (ref 5–15)
BUN: 20 mg/dL (ref 8–23)
CO2: 25 mmol/L (ref 22–32)
Calcium: 9.4 mg/dL (ref 8.9–10.3)
Chloride: 105 mmol/L (ref 98–111)
Creatinine, Ser: 0.82 mg/dL (ref 0.44–1.00)
GFR calc Af Amer: >60 mL/min (ref >60)
GFR calc non Af Amer: >60 mL/min (ref >60)
Glucose, Bld: 106 mg/dL — ABNORMAL HIGH (ref 70–99)
Potassium: 3.9 mmol/L (ref 3.5–5.1)
Sodium: 139 mmol/L (ref 135–145)

## 2019-08-19 MED ORDER — HYDROCODONE-ACETAMINOPHEN 5-325 MG PO TABS: 1.0000 | ORAL_TABLET | Freq: Four times a day (QID) | ORAL | 0 refills | Status: AC | PRN

## 2019-08-19 MED ORDER — DOXYCYCLINE MONOHYDRATE 100 MG PO TABS: 100.0000 mg | ORAL_TABLET | Freq: Two times a day (BID) | ORAL | 0 refills | Status: AC

## 2019-08-19 NOTE — Progress Notes (Signed)
Patient has discharged to home on 08/19/2019. Discharge instruction including medication and appointment was given to patient. Patient has no question at this time. RN gave patient material for dressing change at home as MD ordered at bedside

## 2019-08-19 NOTE — Discharge Summary (Signed)
Discharge Summary  Samantha [name] [id number] DOB: [birth year]  PCP: Cassandria Anger, MD  Admit date: 08/16/2019 Discharge date: 08/19/2019  Time spent: 43mins  Recommendations for Outpatient Follow-up:  1. F/u with PCP within a week  for hospital discharge follow up, repeat cbc/bmp at follow up.  Discharge Diagnoses:  Active Hospital Problems   Diagnosis Date Noted  . Cellulitis of abdominal wall 08/05/2019  . MRSA cellulitis   . Hepatic cirrhosis ([facility]) 03/03/2016  . Hyperlipidemia 2011  . Anemia 2011  . GERD 11/14/2008  . OBESITY, MORBID 05/04/2008  . Hyperglycemia 05/04/2008  . Essential hypertension 03/31/2008    Resolved Hospital Problems  No resolved problems to display.    Discharge Condition: stable  Diet recommendation: heart healthy  Filed Weights   08/16/19 0117  Weight: 131.5 kg    History of present illness: (per admitting MD Dr Olevia Bowens) PCP: Cassandria Anger, MD   Patient coming from: Home.  I have personally briefly reviewed patient's old medical records in [facility]  Chief Complaint: Abscess of abdomen.  HPI: Samantha [name] is a 68 y.o. female with medical history significant of seasonal allergy, liver cirrhosis, diverticulosis, edema, hyperglycemia, GERD, hepatitis C, hyperlipidemia, hypertension, morbid obesity, osteoarthritis, history of pulmonary nodule, uterine fibroid who is coming to the emergency department due to progressively worse right lower abdomen wound due to MRSA that has not improved on oral clindamycin.  She denies fever, but complains of fatigue, chills and profuse night sweats.  No rhinorrhea, sore throat, wheezing, hemoptysis, chest pain, worsening dyspnea, palpitations,, but occasional dizziness and frequent lower extremities pitting edema.  She has had some nausea since taking the clindamycin, but denies emesis, abdominal pain, diarrhea, constipation, melena or hematochezia.  No dysuria, frequency or  hematuria.  Denies polyuria, polydipsia, polyphagia or blurred vision.  ED Course: She will vital signs temperature 98.3 F, pulse 96, respirations 20, blood pressure 150/97 mmHg and O2 sat 99% on room air.  CBC was 5.2, hemoglobin 11.5 g/dL and platelets 260.  CMP shows normal values, except for a glucose of 126 and a calcium of 8.8 mg/dL (normal when corrected to albumin).  Lactic acid was normal.  Hospital Course:  Principal Problem:   Cellulitis of abdominal wall Active Problems:   OBESITY, MORBID   Essential hypertension   GERD   Hyperglycemia   Hepatic cirrhosis (HCC)   Hyperlipidemia   Anemia   MRSA cellulitis   MRSA cellulitis/draining abdominal wound -Failed several oral meds and I/D in pcp 's office -Improving on iv vanc -discharge on oral doxycycline for 10 days to finish total 14days treatment, f/u with pcp.   08/09/2019 wound culture  Susceptibility         Methicillin resistant staphylococcus aureus    AEROBIC CULT, GRAM STAIN POSITIVE 1    CIPROFLOXACIN >=8  Resistant    CLINDAMYCIN <=0.25  Sensitive    ERYTHROMYCIN >=8  Resistant    GENTAMICIN <=0.5  Sensitive    LEVOFLOXACIN 4  Resistant    OXACILLIN NR  Resistant1    TETRACYCLINE <=1  Sensitive    TRIMETH/SULFA >=320  Resistant2    VANCOMYCIN 1  Sensitive      Hepatic cirrhosis (HCC) History of hep C and hepatocellular CA. No signs of decompensation at this time. LFT unremarkable G/u with pcp  Hyperlipidemia No medical treatment due to hepatic condition. Continue lifestyle modifications.    Essential hypertension -Stable continue home medication: Continue furosemide 20 mg p.o. bedtime. Continue losartan  100 mg p.o. daily.   Class III obesity Body mass index is 54.8 kg/m.     Code Status: full  Family Communication: patient   Disposition Plan: home with general surgery clearance    Consultants:  Wound care  General  surgery  Procedures:  none  Antibiotics: Clindamycin IV eight 1820 x 1 dose. Vancomycin IV 8/18/2020to 8/21   Discharge Exam: BP 111/79 (BP Location: Left Arm)   Pulse 78   Temp 98 F (36.7 C)   Resp 17   Ht 5\' 1"  (1.549 m)   Wt 131.5 kg   SpO2 97%   BMI 54.80 kg/m    General:  NAD, pleasant  Cardiovascular: RRR  Respiratory: CTABL  Abdomen: abdominal wound, erythema completely resolved, draining wound with packing in, no odor, no active drainage, +positive BS  Musculoskeletal: No Edema  Neuro: alert, oriented    Discharge Instructions You were cared for by a hospitalist during your hospital stay. If you have any questions about your discharge medications or the care you received while you were in the hospital after you are discharged, you can call the unit and asked to speak with the hospitalist on call if the hospitalist that took care of you is not available. Once you are discharged, your primary care physician will handle any further medical issues. Please note that NO REFILLS for any discharge medications will be authorized once you are discharged, as it is imperative that you return to your primary care physician (or establish a relationship with a primary care physician if you do not have one) for your aftercare needs so that they can reassess your need for medications and monitor your lab values.  Discharge Instructions    Diet - low sodium heart healthy   Complete by: As directed    Low fat diet   Increase activity slowly   Complete by: As directed      Allergies as of 08/19/2019      Reactions   Amlodipine Swelling, Other (See Comments)   Edema whole body, especially in feet   Benazepril Hcl Cough   Codeine Itching      Medication List    STOP taking these medications   clindamycin 150 MG capsule Commonly known as: CLEOCIN   naproxen sodium 220 MG tablet Commonly known as: ALEVE   rifampin 150 MG capsule Commonly known as: RIFADIN      TAKE these medications   aspirin EC 81 MG tablet Take 81 mg by mouth daily. Notes to patient: 08/20/2019   BLACK COHOSH EXTRACT PO Take 1 tablet by mouth daily. Notes to patient: 08/20/2019   doxycycline 100 MG tablet Commonly known as: ADOXA Take 1 tablet (100 mg total) by mouth 2 (two) times daily for 10 days.   EQL Vitamin D3 25 MCG (1000 UT) tablet Generic drug: Cholecalciferol Take 1,000 Units by mouth daily.   furosemide 20 MG tablet Commonly known as: LASIX Take 1-2 tablets (20-40 mg total) by mouth daily. What changed:   how much to take  when to take this   glucosamine-chondroitin 500-400 MG tablet Take 1 tablet by mouth daily.   HYDROcodone-acetaminophen 5-325 MG tablet Commonly known as: NORCO/VICODIN Take 1 tablet by mouth every 6 (six) hours as needed for up to 5 days for moderate pain.   loratadine 10 MG tablet Commonly known as: CLARITIN Take 1 tablet by mouth once daily   losartan 100 MG tablet Commonly known as: COZAAR Take 1 tablet (100 mg total) by  mouth daily.   meloxicam 7.5 MG tablet Commonly known as: MOBIC TAKE 1 TO 2 TABLETS BY MOUTH ONCE DAILY AS NEEDED FOR PAIN What changed:   how much to take  how to take this  when to take this  reasons to take this  additional instructions   multivitamin with minerals Tabs tablet Take 1 tablet by mouth daily.   pantoprazole 40 MG tablet Commonly known as: PROTONIX Take 1 tablet (40 mg total) by mouth daily.   potassium chloride SA 20 MEQ tablet Commonly known as: K-DUR Take 1 tablet (20 mEq total) by mouth daily.   promethazine 12.5 MG tablet Commonly known as: PHENERGAN Take 1 tablet (12.5 mg total) by mouth every 6 (six) hours as needed for nausea or vomiting.   saccharomyces boulardii 250 MG capsule Commonly known as: Florastor Take 1 capsule (250 mg total) by mouth 2 (two) times daily.   vitamin C 500 MG tablet Commonly known as: ASCORBIC ACID Take 500 mg by mouth  daily.      Allergies  Allergen Reactions  . Amlodipine Swelling and Other (See Comments)    Edema whole body, especially in feet  . Benazepril Hcl Cough  . Codeine Itching   Follow-up Information    Plotnikov, Evie Lacks, MD Follow up in 1 week(s).   Specialty: Internal Medicine Why: hospital discharge follow up, repeat cbc/bmp at hospital discharge Contact information: [address] [place] [address] [phone]            The results of significant diagnostics from this hospitalization (including imaging, microbiology, ancillary and laboratory) are listed below for reference.    Significant Diagnostic Studies: Ct Abdomen Pelvis Wo Contrast  Result Date: 08/16/2019 CLINICAL DATA:  Abdominal rigidity.  Concern for abdominal abscess. EXAM: CT ABDOMEN AND PELVIS WITHOUT CONTRAST TECHNIQUE: Multidetector CT imaging of the abdomen and pelvis was performed following the standard protocol without IV contrast. COMPARISON:  MRI dated February 28, 2019 FINDINGS: Lower chest: The lung bases are clear. The heart size is normal. 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 Detection of new tumors is limited by lack of IV contrast. The liver contour somewhat nodular concerning for underlying cirrhosis. The gallbladder is not reliably visualized and may be surgically absent. Pancreas: Normal contours without ductal dilatation. No peripancreatic fluid collection. Spleen: No splenic laceration or hematoma. Adrenals/Urinary Tract: --Adrenal glands: No adrenal hemorrhage. --Right kidney/ureter: No hydronephrosis or perinephric hematoma. --Left kidney/ureter: No hydronephrosis or perinephric hematoma. --Urinary bladder: Unremarkable. Stomach/Bowel: --Stomach/Duodenum: No hiatal hernia or other gastric abnormality. Normal duodenal course and caliber. --Small bowel: No dilatation or inflammation. --Colon: There is severe sigmoid diverticulosis  without CT evidence for diverticulitis. --Appendix: Normal. Vascular/Lymphatic: Atherosclerotic calcification is present within the non-aneurysmal abdominal aorta, without hemodynamically significant stenosis. --No retroperitoneal lymphadenopathy. --No mesenteric lymphadenopathy. --No pelvic or inguinal lymphadenopathy. Reproductive: There is a fibroid uterus. Other: No ascites or free air. There is subcutaneous fat stranding and skin thickening involving the low anterior abdominal wall. There is no fluid collection. Musculoskeletal. There are degenerative changes throughout the visualized lumbar spine. IMPRESSION: 1. There is subcutaneous fat stranding with overlying skin thickening involving the low anterior abdominal wall. This is concerning for cellulitis in the appropriate clinical setting. There is no drainable fluid collection or abscess. 2. No other acute abnormality detected. 3. Additional chronic findings as above. Electronically Signed   By: Constance Holster M.D.   On: 08/16/2019 19:11    Microbiology:  Recent Results (from the past 240 hour(s))  WOUND CULTURE     Status: Abnormal   Collection Time: 08/09/19  3:48 PM   Specimen: Wound  Result Value Ref Range Status   MICRO NUMBER: [id number]  Final   SPECIMEN QUALITY: Adequate  Final   SOURCE: RLQ ABD WALL ABSCESS  Final   STATUS: FINAL  Final   GRAM STAIN:   Final    Moderate White blood cells seen No epithelial cells seen No organisms seen   ISOLATE 1: methicillin resistant Staphylococcus aureus (A)  Final    Comment: Moderate growth of Methicillin resistant Staphylococcus aureus (MRSA) Negative for inducible clindamycin resistance.      Susceptibility   Methicillin resistant staphylococcus aureus - AEROBIC CULT, GRAM STAIN POSITIVE 1    VANCOMYCIN 1 Sensitive     CIPROFLOXACIN >=8 Resistant     CLINDAMYCIN <=0.25 Sensitive     LEVOFLOXACIN 4 Resistant     ERYTHROMYCIN >=8 Resistant     GENTAMICIN <=0.5 Sensitive     OXACILLIN*  NR Resistant      * Oxacillin-resistant staphylococci are resistant toall currently available beta-lactam antimicrobialagents including penicillins, beta lactam/beta-lactamase inhibitor combinations, and cephems withstaphylococcal indications, including Cefazolin.    TETRACYCLINE <=1 Sensitive     TRIMETH/SULFA* >=320 Resistant      * Oxacillin-resistant staphylococci are resistant toall currently available beta-lactam antimicrobialagents including penicillins, beta lactam/beta-lactamase inhibitor combinations, and cephems withstaphylococcal indications, including Cefazolin.Legend:S = Susceptible  I = IntermediateR = Resistant  NS = Not susceptible* = Not tested  NR = Not reported**NN = See antimicrobic comments  Blood culture (routine x 2)     Status: None (Preliminary result)   Collection Time: 08/16/19  6:00 AM   Specimen: BLOOD  Result Value Ref Range Status   Specimen Description   Final    BLOOD RIGHT ARM Performed at [facility] [address], [place], [place] [address]    Special Requests   Final    BOTTLES DRAWN AEROBIC AND ANAEROBIC Blood Culture adequate volume Performed at [facility] [address] [place], [place] [address]    Culture   Final    NO GROWTH 3 DAYS Performed at [facility], [place] [address] [place], [place] [address]    Report Status PENDING  Incomplete  SARS CORONAVIRUS 2 Nasal Swab Aptima Multi Swab     Status: None   Collection Time: 08/16/19  6:04 AM   Specimen: Aptima Multi Swab; Nasal Swab  Result Value Ref Range Status   SARS Coronavirus 2 NEGATIVE NEGATIVE Final    Comment: (NOTE) SARS-CoV-2 target nucleic acids are NOT DETECTED. The SARS-CoV-2 RNA is generally detectable in upper and lower respiratory specimens during the acute phase of infection. Negative results do not preclude SARS-CoV-2 infection, do not rule out co-infections with other pathogens, and should not be used as the sole basis for  treatment or other patient management decisions. Negative results must be combined with clinical observations, patient history, and epidemiological information. The expected result is Negative. Fact Sheet for Patients: SugarRoll.be Fact Sheet for Healthcare Providers: https://www.woods-mathews.com/ This test is not yet approved or cleared by the [place] FDA and  has been authorized for detection and/or diagnosis of SARS-CoV-2 by FDA under an Emergency Use Authorization (EUA). This EUA will remain  in effect (meaning this test can be used) for the duration of the COVID-19 declaration under Section 56 4(b)(1) of the Act, 21 U.S.C. section 360bbb-3(b)(1), unless the  authorization is terminated or revoked sooner. Performed at [facility], [place] [address], [place], [place] [address]   Aerobic/Anaerobic Culture (surgical/deep wound)     Status: None (Preliminary result)   Collection Time: 08/16/19  2:09 PM   Specimen: Abdomen; Abscess  Result Value Ref Range Status   Specimen Description   Final    ABDOMEN Performed at [facility] [address], [place], [place] [address]    Special Requests   Final    NONE Performed at [facility], [place] [address], [place], [place] [address]    Gram Stain   Final    RARE WBC PRESENT, PREDOMINANTLY PMN NO ORGANISMS SEEN Performed at [facility], [place] [address], [place], [place] [address]    Culture   Final    RARE NORMAL SKIN FLORA NO ANAEROBES ISOLATED; CULTURE IN PROGRESS FOR 5 DAYS    Report Status PENDING  Incomplete     Labs: Basic Metabolic Panel: Recent Labs  Lab 08/16/19 0126 08/17/19 0531 08/18/19 0010 08/18/19 0537 08/19/19 0612  NA 139 138  --  137 139  K 3.7 4.2  --  4.0 3.9  CL 107 108  --  107 105  CO2 22 24  --  24 25  GLUCOSE 126* 102*  --  107* 106*  BUN 17 17  --  16 20  CREATININE 0.88 0.82 0.86 0.83 0.82  CALCIUM  8.8* 8.6*  --  8.7* 9.4  MG  --   --   --   --  1.9   Liver Function Tests: Recent Labs  Lab 08/16/19 0126 08/17/19 0531  AST 22 19  ALT 17 15  ALKPHOS 102 86  BILITOT 0.5 0.3  PROT 7.8 6.8  ALBUMIN 3.5 3.2*   No results for input(s): LIPASE, AMYLASE in the last 168 hours. No results for input(s): AMMONIA in the last 168 hours. CBC: Recent Labs  Lab 08/16/19 0126 08/17/19 0531 08/18/19 0537 08/19/19 0519  WBC 5.2 3.7* 3.3* 4.8  NEUTROABS 2.9  --  1.6* 2.9  HGB 11.5* 10.8* 11.4* 11.4*  HCT 38.5 36.3 38.7 37.9  MCV 87.3 88.1 87.8 86.9  PLT 260 275 266 276   Cardiac Enzymes: No results for input(s): CKTOTAL, CKMB, CKMBINDEX, TROPONINI in the last 168 hours. BNP: BNP (last 3 results) No results for input(s): BNP in the last 8760 hours.  ProBNP (last 3 results) No results for input(s): PROBNP in the last 8760 hours.  CBG: No results for input(s): GLUCAP in the last 168 hours.     Signed:  Florencia Reasons MD, PhD, FACP  [facility] 08/19/2019, 12:36 PM

## 2019-08-19 NOTE — Telephone Encounter (Signed)
Transition Care Management Follow-up Telephone Call  How have you been since you were released from the hospital? I am feeling better.   Do you understand why you were in the hospital? yes   Do you understand the discharge instrcutions? yes  Items Reviewed:  Medications reviewed: yes  Allergies reviewed: yes  Dietary changes reviewed: yes  Referrals reviewed: yes   Functional Questionnaire:   Activities of Daily Living (ADLs):   She states they are independent in the following: ambulation, bathing and hygiene, feeding, continence, grooming, toileting and dressing States they require assistance with the following:none   Any transportation issues/concerns?: yes   Any patient concerns? yes   Confirmed importance and date/time of follow-up visits scheduled: yes, 08/23/19 @2 :40   Confirmed with patient if condition begins to worsen call PCP or go to the ER.  Patient was given the Call-a-Nurse line [phone]: yes

## 2019-08-19 NOTE — Telephone Encounter (Signed)
Transition Care Management Follow-up Telephone Call   Date discharged? 08/16/2019   How have you been since you were released from the hospital? Feeling much better   Do you understand why you were in the hospital? Yes   Do you understand the discharge instructions? Yes   Where were you discharged to? Yes   Items Reviewed:  Medications reviewed: yes  Allergies reviewed: yes  Dietary changes reviewed: yes  Referrals reviewed: n/a   Functional Questionnaire:   Activities of Daily Living (ADLs):   States they are independent in the following: all adl's States they require assistance with the following: na/   Any transportation issues/concerns?: no   Any patient concerns? no   Confirmed importance and date/time of follow-up visits scheduled Yes  Provider Appointment booked with PCP on 08/23/2019  Confirmed with patient if condition begins to worsen call PCP or go to the ER.  Patient was given the office number and encouraged to call back with question or concerns. Yes

## 2019-08-19 NOTE — Care Management Important Message (Signed)
Important Message  Patient Details IM Letter given to Nancy Marus RN to present to the Patient Name: Samantha [name] MRN: [id number] Date of Birth: 1950/12/30   Medicare Important Message Given:  Yes     Kerin Salen 08/19/2019, 10:30 AM

## 2019-08-21 LAB — CULTURE, BLOOD (ROUTINE X 2)
Culture: NO GROWTH
Special Requests: ADEQUATE

## 2019-08-23 ENCOUNTER — Encounter: Payer: Self-pay | Admitting: Internal Medicine

## 2019-08-23 ENCOUNTER — Other Ambulatory Visit: Payer: Self-pay

## 2019-08-23 ENCOUNTER — Ambulatory Visit (INDEPENDENT_AMBULATORY_CARE_PROVIDER_SITE_OTHER): Payer: Medicare Other | Admitting: Internal Medicine

## 2019-08-23 VITALS — BP 136/82 | HR 81 | Temp 98.3°F | Wt 295.0 lb

## 2019-08-23 DIAGNOSIS — L03311 Cellulitis of abdominal wall: Secondary | ICD-10-CM

## 2019-08-23 DIAGNOSIS — Z23 Encounter for immunization: Secondary | ICD-10-CM

## 2019-08-23 DIAGNOSIS — L0390 Cellulitis, unspecified: Secondary | ICD-10-CM | POA: Diagnosis not present

## 2019-08-23 DIAGNOSIS — B9562 Methicillin resistant Staphylococcus aureus infection as the cause of diseases classified elsewhere: Secondary | ICD-10-CM

## 2019-08-23 DIAGNOSIS — G8929 Other chronic pain: Secondary | ICD-10-CM

## 2019-08-23 DIAGNOSIS — M5440 Lumbago with sciatica, unspecified side: Secondary | ICD-10-CM

## 2019-08-23 DIAGNOSIS — R739 Hyperglycemia, unspecified: Secondary | ICD-10-CM

## 2019-08-23 LAB — AEROBIC/ANAEROBIC CULTURE W GRAM STAIN (SURGICAL/DEEP WOUND)

## 2019-08-23 NOTE — Assessment & Plan Note (Signed)
Diet rec given

## 2019-08-23 NOTE — Progress Notes (Signed)
Subjective:  Patient ID: Samantha [name], female    DOB: 02-23-1951  Age: 68 y.o. MRN: [id number]  CC: No chief complaint on file.   HPI Samantha [name] presents for MRSA abd wall cellulitis - d/c'd on 8/21 after a week of hosp stay and IV abx treatment; s/p abd wall abscess I&D by Dr Ronnald Ramp in the office.  As per d/c summary:   "Recommendations for Outpatient Follow-up:  1. F/u with PCP within a week  for hospital discharge follow up, repeat cbc/bmp at follow up.  Discharge Diagnoses:      Active Hospital Problems   Diagnosis Date Noted   Cellulitis of abdominal wall 08/05/2019   MRSA cellulitis    Hepatic cirrhosis ([place]) 03/03/2016   Hyperlipidemia 2011   Anemia 2011   GERD 11/14/2008   OBESITY, MORBID 05/04/2008   Hyperglycemia 05/04/2008   Essential hypertension 03/31/2008    Resolved Hospital Problems  No resolved problems to display.    Discharge Condition: stable  Diet recommendation: heart healthy     Filed Weights   08/16/19 0117  Weight: 131.5 kg    History of present illness: (per admitting MD Dr Olevia Bowens) [id number], Evie Lacks, MD  Patient coming from:Home.  I have personally briefly reviewed patient's old medical records in [facility]  Chief Complaint:Abscess of abdomen.  [id number] Samantha Lindsayis a 68 y.o.femalewith medical history significant ofseasonal allergy, liver cirrhosis, diverticulosis, edema, hyperglycemia, GERD, hepatitis C, hyperlipidemia, hypertension, morbid obesity, osteoarthritis, history of pulmonary nodule, uterine fibroid who is coming to the emergency department due to progressively worse right lower abdomen wound due to MRSA that has notimproved on oral clindamycin.She denies fever, but complains of fatigue, chills and profuse night sweats. No rhinorrhea, sore throat, wheezing, hemoptysis, chest pain, worsening dyspnea, palpitations,, but occasional dizziness and frequent lower  extremities pitting edema. She has had some nausea since taking the clindamycin, but denies emesis, abdominal pain, diarrhea, constipation, melena or hematochezia. No dysuria, frequency or hematuria. Denies polyuria, polydipsia, polyphagia or blurred vision.  ED Course:She will vital signs temperature 98.3 F, pulse 96, respirations 20, blood pressure 150/97 mmHg and O2 sat 99% on room air.  CBC was 5.2, hemoglobin 11.5 g/dL and platelets 260. CMP shows normal values, except for a glucose of 126 and a calcium of 8.8 mg/dL (normal when corrected to albumin). Lactic acid was normal.  Hospital Course:  Principal Problem:   Cellulitis of abdominal wall Active Problems:   OBESITY, MORBID   Essential hypertension   GERD   Hyperglycemia   Hepatic cirrhosis (HCC)   Hyperlipidemia   Anemia   MRSA cellulitis   MRSA cellulitis/draining abdominal wound -Failed several oral meds and I/D in pcp 's office -Improving on iv vanc -discharge on oral doxycycline for 10 days to finish total 14days treatment, f/u with pcp.   08/09/2019 wound culture  Susceptibility         Methicillin resistant staphylococcus aureus    AEROBIC CULT, GRAM STAIN POSITIVE 1    CIPROFLOXACIN >=8  Resistant    CLINDAMYCIN <=0.25  Sensitive    ERYTHROMYCIN >=8  Resistant    GENTAMICIN <=0.5  Sensitive    LEVOFLOXACIN 4  Resistant    OXACILLIN NR  Resistant1    TETRACYCLINE <=1  Sensitive    TRIMETH/SULFA >=320  Resistant2    VANCOMYCIN 1  Sensitive      Hepatic cirrhosis (HCC) History of hep C and hepatocellular CA. No signs of decompensation at this time. LFT unremarkable  G/u with pcp  Hyperlipidemia No medical treatment due to hepatic condition. Continue lifestyle modifications.    Essential hypertension -Stable continue home medication: Continue furosemide 20 mg p.o. bedtime. Continue losartan 100 mg p.o. daily.   Class III obesity Body mass  index is 54.8 kg/m."     Outpatient Medications Prior to Visit  Medication Sig Dispense Refill   aspirin EC 81 MG tablet Take 81 mg by mouth daily.     BLACK COHOSH EXTRACT PO Take 1 tablet by mouth daily.     Cholecalciferol (EQL VITAMIN D3) 1000 UNITS tablet Take 1,000 Units by mouth daily.      doxycycline (ADOXA) 100 MG tablet Take 1 tablet (100 mg total) by mouth 2 (two) times daily for 10 days. 20 tablet 0   furosemide (LASIX) 20 MG tablet Take 1-2 tablets (20-40 mg total) by mouth daily. (Patient taking differently: Take 20 mg by mouth at bedtime. ) 180 tablet 3   glucosamine-chondroitin 500-400 MG tablet Take 1 tablet by mouth daily.     HYDROcodone-acetaminophen (NORCO/VICODIN) 5-325 MG tablet Take 1 tablet by mouth every 6 (six) hours as needed for up to 5 days for moderate pain. 5 tablet 0   loratadine (CLARITIN) 10 MG tablet Take 1 tablet by mouth once daily 90 tablet 1   losartan (COZAAR) 100 MG tablet Take 1 tablet (100 mg total) by mouth daily. 90 tablet 3   meloxicam (MOBIC) 7.5 MG tablet TAKE 1 TO 2 TABLETS BY MOUTH ONCE DAILY AS NEEDED FOR PAIN (Patient taking differently: Take 7.5-15 mg by mouth daily as needed (pain.). ) 60 tablet 1   Multiple Vitamin (MULTIVITAMIN WITH MINERALS) TABS tablet Take 1 tablet by mouth daily.      pantoprazole (PROTONIX) 40 MG tablet Take 1 tablet (40 mg total) by mouth daily. 90 tablet 3   potassium chloride SA (K-DUR) 20 MEQ tablet Take 1 tablet (20 mEq total) by mouth daily. 90 tablet 3   promethazine (PHENERGAN) 12.5 MG tablet Take 1 tablet (12.5 mg total) by mouth every 6 (six) hours as needed for nausea or vomiting. 30 tablet 0   saccharomyces boulardii (FLORASTOR) 250 MG capsule Take 1 capsule (250 mg total) by mouth 2 (two) times daily. 30 capsule 0   vitamin C (ASCORBIC ACID) 500 MG tablet Take 500 mg by mouth daily.     No facility-administered medications prior to visit.     ROS: Review of Systems    Constitutional: Positive for unexpected weight change. Negative for activity change, appetite change, chills and fatigue.  HENT: Negative for congestion, mouth sores and sinus pressure.   Eyes: Negative for visual disturbance.  Respiratory: Negative for cough and chest tightness.   Gastrointestinal: Negative for abdominal pain and nausea.  Genitourinary: Negative for difficulty urinating, frequency and vaginal pain.  Musculoskeletal: Negative for back pain and gait problem.  Skin: Positive for wound. Negative for pallor and rash.  Neurological: Negative for dizziness, tremors, weakness, numbness and headaches.  Psychiatric/Behavioral: Negative for confusion, sleep disturbance and suicidal ideas.    Objective:  BP 136/82    Pulse 81    Temp 98.3 F (36.8 C)    Wt 295 lb (133.8 kg)    SpO2 96%    BMI 55.74 kg/m   BP Readings from Last 3 Encounters:  08/23/19 136/82  08/19/19 111/79  08/11/19 128/70    Wt Readings from Last 3 Encounters:  08/23/19 295 lb (133.8 kg)  08/16/19 290 lb (131.5 kg)  08/11/19 297 lb (134.7 kg)    Physical Exam Constitutional:      General: She is not in acute distress.    Appearance: She is well-developed. She is obese.  HENT:     Head: Normocephalic.     Right Ear: External ear normal.     Left Ear: External ear normal.     Nose: Nose normal.  Eyes:     General:        Right eye: No discharge.        Left eye: No discharge.     Conjunctiva/sclera: Conjunctivae normal.     Pupils: Pupils are equal, round, and reactive to light.  Neck:     Musculoskeletal: Normal range of motion and neck supple.     Thyroid: No thyromegaly.     Vascular: No JVD.     Trachea: No tracheal deviation.  Cardiovascular:     Rate and Rhythm: Normal rate and regular rhythm.     Heart sounds: Normal heart sounds.  Pulmonary:     Effort: No respiratory distress.     Breath sounds: No stridor. No wheezing.  Abdominal:     General: Bowel sounds are normal. There is  no distension.     Palpations: Abdomen is soft. There is no mass.     Tenderness: There is no abdominal tenderness. There is no guarding or rebound.  Musculoskeletal:        General: No tenderness.  Lymphadenopathy:     Cervical: No cervical adenopathy.  Skin:    Findings: No erythema or rash.  Neurological:     Mental Status: She is oriented to person, place, and time.     Cranial Nerves: No cranial nerve deficit.     Motor: No abnormal muscle tone.     Coordination: Coordination normal.     Deep Tendon Reflexes: Reflexes normal.  Psychiatric:        Behavior: Behavior normal.        Thought Content: Thought content normal.        Judgment: Judgment normal.   wound - dressed  Lab Results  Component Value Date   WBC 4.8 08/19/2019   HGB 11.4 (L) 08/19/2019   HCT 37.9 08/19/2019   PLT 276 08/19/2019   GLUCOSE 106 (H) 08/19/2019   CHOL 154 10/20/2017   TRIG 132.0 10/20/2017   HDL 44.80 10/20/2017   LDLCALC 82 10/20/2017   ALT 15 08/17/2019   AST 19 08/17/2019   NA 139 08/19/2019   K 3.9 08/19/2019   CL 105 08/19/2019   CREATININE 0.82 08/19/2019   BUN 20 08/19/2019   CO2 25 08/19/2019   TSH 1.91 10/20/2017   INR 1.0 05/27/2018   HGBA1C 6.0 (H) 08/18/2019    Ct Abdomen Pelvis Wo Contrast  Result Date: 08/16/2019 CLINICAL DATA:  Abdominal rigidity.  Concern for abdominal abscess. EXAM: CT ABDOMEN AND PELVIS WITHOUT CONTRAST TECHNIQUE: Multidetector CT imaging of the abdomen and pelvis was performed following the standard protocol without IV contrast. COMPARISON:  MRI dated February 28, 2019 FINDINGS: Lower chest: The lung bases are clear. The heart size is normal. 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 Detection of new tumors is limited by lack of IV contrast. The liver contour somewhat nodular concerning for underlying cirrhosis. The gallbladder is not reliably visualized and may be surgically absent. Pancreas:  Normal contours without ductal dilatation. No peripancreatic fluid collection. Spleen: No splenic laceration or hematoma. Adrenals/Urinary  Tract: --Adrenal glands: No adrenal hemorrhage. --Right kidney/ureter: No hydronephrosis or perinephric hematoma. --Left kidney/ureter: No hydronephrosis or perinephric hematoma. --Urinary bladder: Unremarkable. Stomach/Bowel: --Stomach/Duodenum: No hiatal hernia or other gastric abnormality. Normal duodenal course and caliber. --Small bowel: No dilatation or inflammation. --Colon: There is severe sigmoid diverticulosis without CT evidence for diverticulitis. --Appendix: Normal. Vascular/Lymphatic: Atherosclerotic calcification is present within the non-aneurysmal abdominal aorta, without hemodynamically significant stenosis. --No retroperitoneal lymphadenopathy. --No mesenteric lymphadenopathy. --No pelvic or inguinal lymphadenopathy. Reproductive: There is a fibroid uterus. Other: No ascites or free air. There is subcutaneous fat stranding and skin thickening involving the low anterior abdominal wall. There is no fluid collection. Musculoskeletal. There are degenerative changes throughout the visualized lumbar spine. IMPRESSION: 1. There is subcutaneous fat stranding with overlying skin thickening involving the low anterior abdominal wall. This is concerning for cellulitis in the appropriate clinical setting. There is no drainable fluid collection or abscess. 2. No other acute abnormality detected. 3. Additional chronic findings as above. Electronically Signed   By: Constance Holster M.D.   On: 08/16/2019 19:11    Assessment & Plan:   There are no diagnoses linked to this encounter.   No orders of the defined types were placed in this encounter.    Follow-up: No follow-ups on file.  Walker Kehr, MD

## 2019-08-23 NOTE — Assessment & Plan Note (Signed)
Labs

## 2019-08-23 NOTE — Patient Instructions (Signed)

## 2019-08-23 NOTE — Assessment & Plan Note (Signed)
Meloxicam

## 2019-08-23 NOTE — Assessment & Plan Note (Signed)
MRSA abd wall cellulitis - d/c'd on 8/21 after a week of hosp stay and s/p abd wall abscess I&D by Dr Ronnald Ramp in the office.

## 2019-08-23 NOTE — Assessment & Plan Note (Signed)
MRSA abd wall cellulitis - d/c'd on 8/21 after a week of hosp stay and s/p abd wall abscess I&D by Dr Ronnald Ramp in the office. Pt finished abx Abd CT report w/cellulitis

## 2019-09-12 ENCOUNTER — Ambulatory Visit
Admission: RE | Admit: 2019-09-12 | Discharge: 2019-09-12 | Disposition: A | Discharge status: Home / Self Care | Payer: Medicare Other | Source: Ambulatory Visit | Attending: Nurse Practitioner | Admitting: Nurse Practitioner

## 2019-09-12 ENCOUNTER — Other Ambulatory Visit: Payer: Self-pay

## 2019-09-12 DIAGNOSIS — C220 Liver cell carcinoma: Secondary | ICD-10-CM

## 2019-09-12 DIAGNOSIS — K5730 Diverticulosis of large intestine without perforation or abscess without bleeding: Secondary | ICD-10-CM | POA: Diagnosis not present

## 2019-09-12 MED ORDER — GADOXETATE DISODIUM 0.25 MMOL/ML IV SOLN
10.0000 mL | Freq: Once | INTRAVENOUS | Status: AC | PRN
  Administered 2019-09-12: 10 mL via INTRAVENOUS

## 2019-09-14 ENCOUNTER — Ambulatory Visit: Payer: Medicare Other | Admitting: Internal Medicine

## 2019-11-23 ENCOUNTER — Encounter: Payer: Self-pay | Admitting: Internal Medicine

## 2019-11-23 ENCOUNTER — Other Ambulatory Visit (INDEPENDENT_AMBULATORY_CARE_PROVIDER_SITE_OTHER): Payer: Medicare Other

## 2019-11-23 ENCOUNTER — Ambulatory Visit (INDEPENDENT_AMBULATORY_CARE_PROVIDER_SITE_OTHER): Payer: Medicare Other | Admitting: Internal Medicine

## 2019-11-23 ENCOUNTER — Other Ambulatory Visit: Payer: Self-pay

## 2019-11-23 DIAGNOSIS — R350 Frequency of micturition: Secondary | ICD-10-CM | POA: Diagnosis not present

## 2019-11-23 DIAGNOSIS — C220 Liver cell carcinoma: Secondary | ICD-10-CM | POA: Diagnosis not present

## 2019-11-23 DIAGNOSIS — I10 Essential (primary) hypertension: Secondary | ICD-10-CM | POA: Diagnosis not present

## 2019-11-23 LAB — URINALYSIS
Bilirubin Urine: NEGATIVE
Hgb urine dipstick: NEGATIVE
Ketones, ur: NEGATIVE
Leukocytes,Ua: NEGATIVE
Nitrite: NEGATIVE
Specific Gravity, Urine: 1.025 (ref 1.000–1.030)
Total Protein, Urine: NEGATIVE
Urine Glucose: NEGATIVE
Urobilinogen, UA: 0.2 (ref 0.0–1.0)
pH: 6 (ref 5.0–8.0)

## 2019-11-23 MED ORDER — MELOXICAM 7.5 MG PO TABS: ORAL_TABLET | ORAL | 1 refills | Status: DC

## 2019-11-23 MED ORDER — FLUCONAZOLE 150 MG PO TABS: 150.0000 mg | ORAL_TABLET | Freq: Once | ORAL | 1 refills | Status: AC

## 2019-11-23 MED ORDER — CIPROFLOXACIN HCL 250 MG PO TABS: 250.0000 mg | ORAL_TABLET | Freq: Two times a day (BID) | ORAL | 1 refills | Status: DC

## 2019-11-23 NOTE — Assessment & Plan Note (Signed)
Treat UTI 

## 2019-11-23 NOTE — Progress Notes (Signed)
Subjective:  Patient ID: Samantha [name], female    DOB: 07/22/1951  Age: 68 y.o. MRN: [id number]  CC: No chief complaint on file.   HPI Lashaya Novinger presents for dysuria since Sat F/u liver cancer, OA/LBP   Outpatient Medications Prior to Visit  Medication Sig Dispense Refill  . aspirin EC 81 MG tablet Take 81 mg by mouth daily.    Marland Kitchen BLACK COHOSH EXTRACT PO Take 1 tablet by mouth daily.    . Cholecalciferol (EQL VITAMIN D3) 1000 UNITS tablet Take 1,000 Units by mouth daily.     . furosemide (LASIX) 20 MG tablet Take 1-2 tablets (20-40 mg total) by mouth daily. (Patient taking differently: Take 20 mg by mouth at bedtime. ) 180 tablet 3  . glucosamine-chondroitin 500-400 MG tablet Take 1 tablet by mouth daily.    Marland Kitchen loratadine (CLARITIN) 10 MG tablet Take 1 tablet by mouth once daily 90 tablet 1  . losartan (COZAAR) 100 MG tablet Take 1 tablet (100 mg total) by mouth daily. 90 tablet 3  . meloxicam (MOBIC) 7.5 MG tablet TAKE 1 TO 2 TABLETS BY MOUTH ONCE DAILY AS NEEDED FOR PAIN (Patient taking differently: Take 7.5-15 mg by mouth daily as needed (pain.). ) 60 tablet 1  . Multiple Vitamin (MULTIVITAMIN WITH MINERALS) TABS tablet Take 1 tablet by mouth daily.     . pantoprazole (PROTONIX) 40 MG tablet Take 1 tablet (40 mg total) by mouth daily. 90 tablet 3  . potassium chloride SA (K-DUR) 20 MEQ tablet Take 1 tablet (20 mEq total) by mouth daily. 90 tablet 3  . promethazine (PHENERGAN) 12.5 MG tablet Take 1 tablet (12.5 mg total) by mouth every 6 (six) hours as needed for nausea or vomiting. 30 tablet 0  . saccharomyces boulardii (FLORASTOR) 250 MG capsule Take 1 capsule (250 mg total) by mouth 2 (two) times daily. 30 capsule 0  . vitamin C (ASCORBIC ACID) 500 MG tablet Take 500 mg by mouth daily.     No facility-administered medications prior to visit.     ROS: Review of Systems  Constitutional: Negative for activity change, appetite change, chills, fatigue and unexpected  weight change.  HENT: Negative for congestion, mouth sores and sinus pressure.   Eyes: Negative for visual disturbance.  Respiratory: Negative for cough and chest tightness.   Gastrointestinal: Negative for abdominal pain and nausea.  Genitourinary: Positive for dysuria, frequency and urgency. Negative for difficulty urinating and vaginal pain.  Musculoskeletal: Positive for back pain. Negative for gait problem.  Skin: Negative for pallor and rash.  Neurological: Negative for dizziness, tremors, weakness, numbness and headaches.  Psychiatric/Behavioral: Negative for confusion and sleep disturbance.    Objective:  BP 128/82 (BP Location: Right Arm, Patient Position: Sitting, Cuff Size: Large)   Pulse 100   Temp 98.1 F (36.7 C) (Oral)   Ht 5\' 1"  (1.549 m)   Wt 300 lb (136.1 kg)   SpO2 99%   BMI 56.68 kg/m   BP Readings from Last 3 Encounters:  11/23/19 128/82  08/23/19 136/82  08/19/19 111/79    Wt Readings from Last 3 Encounters:  11/23/19 300 lb (136.1 kg)  08/23/19 295 lb (133.8 kg)  08/16/19 290 lb (131.5 kg)    Physical Exam Constitutional:      General: She is not in acute distress.    Appearance: She is well-developed. She is obese.  HENT:     Head: Normocephalic.     Right Ear: External ear normal.  Left Ear: External ear normal.     Nose: Nose normal.  Eyes:     General:        Right eye: No discharge.        Left eye: No discharge.     Conjunctiva/sclera: Conjunctivae normal.     Pupils: Pupils are equal, round, and reactive to light.  Neck:     Musculoskeletal: Normal range of motion and neck supple.     Thyroid: No thyromegaly.     Vascular: No JVD.     Trachea: No tracheal deviation.  Cardiovascular:     Rate and Rhythm: Normal rate and regular rhythm.     Heart sounds: Normal heart sounds.  Pulmonary:     Effort: No respiratory distress.     Breath sounds: No stridor. No wheezing.  Abdominal:     General: Bowel sounds are normal. There is  no distension.     Palpations: Abdomen is soft. There is no mass.     Tenderness: There is no abdominal tenderness. There is no guarding or rebound.  Musculoskeletal:        General: No tenderness.  Lymphadenopathy:     Cervical: No cervical adenopathy.  Skin:    Findings: No erythema or rash.  Neurological:     Mental Status: She is oriented to person, place, and time.     Cranial Nerves: No cranial nerve deficit.     Motor: No abnormal muscle tone.     Coordination: Coordination normal.     Deep Tendon Reflexes: Reflexes normal.  Psychiatric:        Behavior: Behavior normal.        Thought Content: Thought content normal.        Judgment: Judgment normal.     Lab Results  Component Value Date   WBC 4.8 08/19/2019   HGB 11.4 (L) 08/19/2019   HCT 37.9 08/19/2019   PLT 276 08/19/2019   GLUCOSE 106 (H) 08/19/2019   CHOL 154 10/20/2017   TRIG 132.0 10/20/2017   HDL 44.80 10/20/2017   LDLCALC 82 10/20/2017   ALT 15 08/17/2019   AST 19 08/17/2019   NA 139 08/19/2019   K 3.9 08/19/2019   CL 105 08/19/2019   CREATININE 0.82 08/19/2019   BUN 20 08/19/2019   CO2 25 08/19/2019   TSH 1.91 10/20/2017   INR 1.0 05/27/2018   HGBA1C 6.0 (H) 08/18/2019    Mr Abdomen Wwo Contrast  Result Date: 09/12/2019 CLINICAL DATA:  Hepatocellular carcinoma, ablation in 2017. EXAM: MRI ABDOMEN WITHOUT AND WITH CONTRAST TECHNIQUE: Multiplanar multisequence MR imaging of the abdomen was performed both before and after the administration of intravenous contrast. CONTRAST:  55mL EOVIST GADOXETATE DISODIUM 0.25 MOL/L IV SOLN COMPARISON:  Multiple exams, including 02/28/2019 and CT scan from 08/16/2019 FINDINGS: Lower chest: Unremarkable Hepatobiliary: The segment 8 lesion measuring 3.1 by 2.4 cm on image 50/8 has heterogeneous precontrast T1 signal characteristics but no significant internal enhancement. No new or worrisome enhancing liver lesion is identified. The gallbladder is absent. Mild  lobularity of the liver margin is specially in the left hepatic lobe suggesting cirrhosis. Pancreas:  Unremarkable Spleen:  Unremarkable Adrenals/Urinary Tract:  Unremarkable Stomach/Bowel: Descending colon diverticulosis. Vascular/Lymphatic:  Unremarkable Other:  No supplemental non-categorized findings. Musculoskeletal: Unremarkable IMPRESSION: 1. Similar appearance of the treated segment 8 lesion which demonstrates heterogeneous precontrast T1 signal but no internal enhancement to suggest residual/recurrent malignancy. 2. Mild lobularity of the liver margin compatible with hepatic cirrhosis. 3. Descending colon  diverticulosis. Electronically Signed   By: Van Clines M.D.   On: 09/12/2019 14:19    Assessment & Plan:   There are no diagnoses linked to this encounter.   No orders of the defined types were placed in this encounter.    Follow-up: No follow-ups on file.  Walker Kehr, MD

## 2019-11-23 NOTE — Assessment & Plan Note (Signed)
Losartan, Norvasc, Lasix 

## 2019-11-23 NOTE — Assessment & Plan Note (Signed)
Wt Readings from Last 3 Encounters:  11/23/19 300 lb (136.1 kg)  08/23/19 295 lb (133.8 kg)  08/16/19 290 lb (131.5 kg)

## 2019-11-23 NOTE — Assessment & Plan Note (Addendum)
Yearly f/u  MRI (9/20) IMPRESSION: 1. Similar appearance of the treated segment 8 lesion which demonstrates heterogeneous precontrast T1 signal but no internal enhancement to suggest residual/recurrent malignancy. 2. Mild lobularity of the liver margin compatible with hepatic cirrhosis. 3. Descending colon diverticulosis.

## 2019-12-28 ENCOUNTER — Other Ambulatory Visit: Payer: Self-pay | Admitting: Internal Medicine

## 2020-01-31 DIAGNOSIS — H2513 Age-related nuclear cataract, bilateral: Secondary | ICD-10-CM | POA: Diagnosis not present

## 2020-01-31 DIAGNOSIS — H25013 Cortical age-related cataract, bilateral: Secondary | ICD-10-CM | POA: Diagnosis not present

## 2020-02-09 DIAGNOSIS — C220 Liver cell carcinoma: Secondary | ICD-10-CM | POA: Diagnosis not present

## 2020-02-09 DIAGNOSIS — K7469 Other cirrhosis of liver: Secondary | ICD-10-CM | POA: Diagnosis not present

## 2020-02-13 ENCOUNTER — Other Ambulatory Visit: Payer: Self-pay | Admitting: Internal Medicine

## 2020-02-20 ENCOUNTER — Other Ambulatory Visit: Payer: Self-pay | Admitting: Internal Medicine

## 2020-02-21 ENCOUNTER — Other Ambulatory Visit: Payer: Self-pay | Admitting: Interventional Radiology

## 2020-02-21 DIAGNOSIS — C220 Liver cell carcinoma: Secondary | ICD-10-CM

## 2020-02-22 ENCOUNTER — Other Ambulatory Visit: Payer: Self-pay | Admitting: Interventional Radiology

## 2020-02-22 DIAGNOSIS — C220 Liver cell carcinoma: Secondary | ICD-10-CM

## 2020-02-23 DIAGNOSIS — C220 Liver cell carcinoma: Secondary | ICD-10-CM | POA: Diagnosis not present

## 2020-02-23 DIAGNOSIS — K7469 Other cirrhosis of liver: Secondary | ICD-10-CM | POA: Diagnosis not present

## 2020-03-10 ENCOUNTER — Other Ambulatory Visit: Payer: Self-pay

## 2020-03-10 ENCOUNTER — Ambulatory Visit (HOSPITAL_COMMUNITY)
Admission: RE | Admit: 2020-03-10 | Discharge: 2020-03-10 | Disposition: A | Discharge status: Home / Self Care | Payer: Medicare Other | Source: Ambulatory Visit | Attending: Interventional Radiology | Admitting: Interventional Radiology

## 2020-03-10 DIAGNOSIS — K7460 Unspecified cirrhosis of liver: Secondary | ICD-10-CM | POA: Diagnosis not present

## 2020-03-10 DIAGNOSIS — C220 Liver cell carcinoma: Secondary | ICD-10-CM | POA: Insufficient documentation

## 2020-03-10 MED ORDER — GADOXETATE DISODIUM 0.25 MMOL/ML IV SOLN
10.0000 mL | Freq: Once | INTRAVENOUS | Status: AC | PRN
  Administered 2020-03-10: 10 mL via INTRAVENOUS

## 2020-03-13 ENCOUNTER — Encounter: Payer: Self-pay | Admitting: *Deleted

## 2020-03-13 ENCOUNTER — Ambulatory Visit
Admission: RE | Admit: 2020-03-13 | Discharge: 2020-03-13 | Disposition: A | Discharge status: Home / Self Care | Payer: Medicare Other | Source: Ambulatory Visit | Attending: Interventional Radiology | Admitting: Interventional Radiology

## 2020-03-13 ENCOUNTER — Other Ambulatory Visit: Payer: Self-pay

## 2020-03-13 DIAGNOSIS — Z8505 Personal history of malignant neoplasm of liver: Secondary | ICD-10-CM | POA: Diagnosis not present

## 2020-03-13 DIAGNOSIS — C220 Liver cell carcinoma: Secondary | ICD-10-CM

## 2020-03-13 HISTORY — PX: IR RADIOLOGIST EVAL & MGMT: IMG5224

## 2020-03-13 NOTE — Progress Notes (Signed)
Chief Complaint: Patient was consulted remotely today (TeleHealth) for hepatocellular carcinoma at the request of [facility].    Referring Physician(s): Dr. Truitt Merle  History of Present Illness: Samantha [name] is a 69 y.o. 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  She underwent conventional lipiodol transarterial chemo embolization on 2/23 and then subsequent CT guided thermal ablation of this lesion on 03/21/2016.  She presents today for continued follow-up evaluation. Her most recent MR imaging from 03/10/2020 demonstrates no evidence of residual or recurrent disease. She is now greater than 4 years out and remains disease-free. She is feeling well and has no complaints including abdominal pain, nausea, vomiting, diarrhea, chest pain or shortness of breath.  Past Medical History:  Diagnosis Date  . Allergy    seasonal  . Cirrhosis ([place]) 2018  . Diverticulosis   . Edema   . Elevated glucose    no longer an issue 03-18-16  . Fatty liver 2017  . GERD (gastroesophageal reflux disease)   . H/O seasonal allergies   . Hepatitis C    hepatitis c completed march 2017  . Hyperlipidemia 2011  . Hypertension   . LBP (low back pain)   . Liver cancer ([place]) 2017   IN REMISSION. WHEN IDAGNONED SIZE OF GRAPEFRUIT   . Morbid obesity ([place])   . Osteoarthritis    B knees  . Right middle lobe pulmonary nodule    resolved 2018  . Uterine fibroid   . Wears glasses     Past Surgical History:  Procedure Laterality Date  . BIOPSY  07/04/2019   Procedure: BIOPSY;  Surgeon: Jerene Bears, MD;  Location: [facility];  Service: Gastroenterology;;  . CHOLECYSTECTOMY    . COLONOSCOPY N/A 01/09/2015   Procedure: COLONOSCOPY;  Surgeon: Jerene Bears, MD;  Location: [facility];  Service: Gastroenterology;  Laterality: N/A;  . ESOPHAGOGASTRODUODENOSCOPY (EGD) WITH PROPOFOL  N/A 06/10/2016   Procedure: ESOPHAGOGASTRODUODENOSCOPY (EGD) WITH PROPOFOL;  Surgeon: Jerene Bears, MD;  Location: [facility];  Service: Gastroenterology;  Laterality: N/A;  . ESOPHAGOGASTRODUODENOSCOPY (EGD) WITH PROPOFOL N/A 07/04/2019   Procedure: ESOPHAGOGASTRODUODENOSCOPY (EGD) WITH PROPOFOL;  Surgeon: Jerene Bears, MD;  Location: [facility];  Service: Gastroenterology;  Laterality: N/A;  . HYSTEROSCOPY WITH D & C N/A 10/15/2018   Procedure: DILATATION AND CURETTAGE /HYSTEROSCOPY;  Surgeon: Aloha Gell, MD;  Location: [facility];  Service: Gynecology;  Laterality: N/A;  . IR GENERIC HISTORICAL  07/03/2016   IR RADIOLOGIST EVAL & MGMT 07/03/2016 [name], MD [facility] INTERV RAD  . IR GENERIC HISTORICAL  11/18/2016   IR RADIOLOGIST EVAL & MGMT 11/18/2016 [name], MD [facility] INTERV RAD  . IR RADIOLOGIST EVAL & MGMT  04/09/2017  . IR RADIOLOGIST EVAL & MGMT  11/05/2017  . JOINT REPLACEMENT     BILATERAL KNEES  . left shoulder arthroplasty    . right knee replacement  2007  . total knee replacement L  03-13-08  . TUBAL LIGATION      Allergies: Amlodipine, Benazepril hcl, and Codeine  Medications: Prior to Admission medications   Medication Sig Start Date End Date Taking? Authorizing Provider  aspirin EC 81 MG tablet Take 81 mg by mouth daily.    [provider]  BLACK COHOSH EXTRACT PO Take 1 tablet by mouth daily.    [provider]  Cholecalciferol (EQL VITAMIN D3) 1000 UNITS tablet Take 1,000 Units by mouth daily.  [provider]  ciprofloxacin (CIPRO) 250 MG tablet Take 1 tablet (250 mg total) by mouth 2 (two) times daily. 11/23/19   Plotnikov, Evie Lacks, MD  furosemide (LASIX) 20 MG tablet Take 1-2 tablets (20-40 mg total) by mouth daily. Patient taking differently: Take 20 mg by mouth at bedtime.  06/13/19   Plotnikov, Evie Lacks, MD  glucosamine-chondroitin 500-400 MG tablet Take 1 tablet by mouth daily.    [provider]    loratadine (CLARITIN) 10 MG tablet Take 1 tablet by mouth once daily 02/14/20   Plotnikov, Evie Lacks, MD  losartan (COZAAR) 100 MG tablet Take 1 tablet (100 mg total) by mouth daily. 06/13/19   Plotnikov, Evie Lacks, MD  meloxicam (MOBIC) 7.5 MG tablet TAKE 1 TO 2 TABLETS BY MOUTH ONCE DAILY AS NEEDED FOR PAIN 11/23/19   Plotnikov, Evie Lacks, MD  Multiple Vitamin (MULTIVITAMIN WITH MINERALS) TABS tablet Take 1 tablet by mouth daily.     [provider]  pantoprazole (PROTONIX) 40 MG tablet Take 1 tablet (40 mg total) by mouth daily. 06/13/19   Plotnikov, Evie Lacks, MD  potassium chloride SA (KLOR-CON) 20 MEQ tablet Take 1 tablet by mouth once daily 12/28/19   Plotnikov, Evie Lacks, MD  promethazine (PHENERGAN) 12.5 MG tablet Take 1 tablet (12.5 mg total) by mouth every 6 (six) hours as needed for nausea or vomiting. 08/09/19   Janith Lima, MD  saccharomyces boulardii (FLORASTOR) 250 MG capsule Take 1 capsule (250 mg total) by mouth 2 (two) times daily. 11/24/18   Plotnikov, Evie Lacks, MD  vitamin C (ASCORBIC ACID) 500 MG tablet Take 500 mg by mouth daily.    [provider]     Family History  Problem Relation Age of Onset  . Hypertension Mother   . Ovarian cancer Mother   . Hypertension Father   . Cancer Father 50       prostate cancer   . Diabetes Father   . Diabetes Other   . Hypertension Other   . Colon cancer Neg Hx   . Esophageal cancer Neg Hx   . Rectal cancer Neg Hx   . Stomach cancer Neg Hx   . Breast cancer Neg Hx     Social History   Socioeconomic History  . Marital status: Single    Spouse name: Not on file  . Number of children: 4  . Years of education: Not on file  . Highest education level: Not on file  Occupational History  . Not on file  Tobacco Use  . Smoking status: Former Smoker    Packs/day: 0.25    Years: 20.00    Pack years: 5.00    Quit date: 12/30/2007    Years since quitting: 12.2  . Smokeless tobacco: Never Used  Substance and  Sexual Activity  . 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 Drug use: No  . Sexual activity: Not Currently    Birth control/protection: Post-menopausal  Other Topics Concern  . Not on file  Social History Narrative   Regular exercise: walk a few times a week   Caffeine use: none   Single, raising her young great granddaughter and watches grandchildren after school   Has a room mate to help prn   Retired from nursing tech--worked at [employer] Strain:   .  Difficulty of Paying Living Expenses:   Food Insecurity:   . Worried About Charity fundraiser in the Last Year:   . Arboriculturist in the Last Year:   Transportation Needs:   . Film/video editor (Medical):   Marland Kitchen Lack of Transportation (Non-Medical):   Physical Activity:   . Days of Exercise per Week:   . Minutes of Exercise per Session:   Stress:   . Feeling of Stress :   Social Connections:   . Frequency of Communication with Friends and Family:   . Frequency of Social Gatherings with Friends and Family:   . Attends Religious Services:   . Active Member of Clubs or Organizations:   . Attends Archivist Meetings:   Marland Kitchen Marital Status:     ECOG Status: 0 - Asymptomatic  Review of Systems  Review of Systems: A 12 point ROS discussed and pertinent positives are indicated in the HPI above.  All other systems are negative.  Physical Exam No direct physical exam was performed (except for noted visual exam findings with Video Visits).   Vital Signs: There were no vitals taken for this visit.  Imaging: MR ABDOMEN WWO CONTRAST  Result Date: 03/10/2020 CLINICAL DATA:  Right liver HCC status post chemoembolization 02/21/2016 and thermal ablation 03/21/2016. Restaging. EXAM: MRI ABDOMEN WITHOUT AND WITH CONTRAST TECHNIQUE: Multiplanar multisequence MR  imaging of the abdomen was performed both before and after the administration of intravenous contrast. CONTRAST:  55mL EOVIST GADOXETATE DISODIUM 0.25 MOL/L IV SOLN COMPARISON:  09/12/2019 MRI abdomen. FINDINGS: Lower chest: No acute abnormality at the lung bases. Hepatobiliary: Finely irregular liver surface, compatible with cirrhosis. No significant hepatic steatosis. Segment 8 right liver lobe ablation zone measures 3.3 x 2.4 cm (series 11/image 43) with mixed precontrast T1 signal intensity and no evidence of enhancement, compatible with treated tumor, previously 3.4 x 2.4 cm on 09/12/2019 using similar measurement technique, unchanged. No new liver lesions. Cholecystectomy. No biliary ductal dilatation. Common bile duct diameter 4 mm. No choledocholithiasis. No biliary strictures, masses or beading. Pancreas: No pancreatic mass or duct dilation.  No pancreas divisum. Spleen: Normal size. No mass. Adrenals/Urinary Tract: Normal adrenals. No hydronephrosis. Normal kidneys with no renal mass. Stomach/Bowel: Normal non-distended stomach. Visualized small and large bowel is normal caliber, with no bowel wall thickening. Vascular/Lymphatic: Normal caliber abdominal aorta. Patent portal, splenic, hepatic and renal veins. No pathologically enlarged lymph nodes in the abdomen. Other: No abdominal ascites or focal fluid collection. Musculoskeletal: No aggressive appearing focal osseous lesions. IMPRESSION: 1. Cirrhosis. 2. Stable segment 8 ablation zone in the right liver lobe, with no evidence of local tumor recurrence. 3. No new liver masses. Electronically Signed   By: Ilona Sorrel M.D.   On: 03/10/2020 16:42    Labs:  CBC: Recent Labs    08/16/19 0126 08/17/19 0531 08/18/19 0537 08/19/19 0519  WBC 5.2 3.7* 3.3* 4.8  HGB 11.5* 10.8* 11.4* 11.4*  HCT 38.5 36.3 38.7 37.9  PLT 260 275 266 276    COAGS: No results for input(s): INR, APTT in the last 8760 hours.  BMP: Recent Labs    08/16/19 0126  08/16/19 0126 08/17/19 0531 08/18/19 0010 08/18/19 0537 08/19/19 0612  NA 139  --  138  --  137 139  K 3.7  --  4.2  --  4.0 3.9  CL 107  --  108  --  107 105  CO2 22  --  24  --  24  25  GLUCOSE 126*  --  102*  --  107* 106*  BUN 17  --  17  --  16 20  CALCIUM 8.8*  --  8.6*  --  8.7* 9.4  CREATININE 0.88   < > 0.82 0.86 0.83 0.82  GFRNONAA >60   < > >60 >60 >60 >60  GFRAA >60   < > >60 >60 >60 >60   < > = values in this interval not displayed.    LIVER FUNCTION TESTS: Recent Labs    08/16/19 0126 08/17/19 0531  BILITOT 0.5 0.3  AST 22 19  ALT 17 15  ALKPHOS 102 86  PROT 7.8 6.8  ALBUMIN 3.5 3.2*    TUMOR MARKERS: No results for input(s): AFPTM, CEA, CA199, CHROMGRNA in the last 8760 hours.  Assessment and Plan:  69 year old female continues to do extremely well now more than 4 years status post combined Deb TACE and percutaneous thermal ablation of hepatocellular carcinoma.  Surveillance imaging completed 03/10/2020 demonstrates no evidence of residual or recurrent disease.  She remains disease-free.    We will continue semiannual surveillance.  1.)  Repeat MRI abdomen with gadolinium contrast and accompanying clinic visit in 6 months.   Electronically Signed: [name] 03/13/2020, 4:19 PM   I spent a total of  10 Minutes in remote  clinical consultation, greater than 50% of which was counseling/coordinating care for hepato.    Visit type: Audio only (telephone). Audio (no video) only due to patient preference. Alternative for in-person consultation at [facility], [place] [address] [place], [place], [place]. This visit type was conducted due to national recommendations for restrictions regarding the COVID-19 Pandemic (e.g. social distancing).  This format is felt to be most appropriate for this patient at this time.  All issues noted in this document were discussed and addressed.

## 2020-03-25 ENCOUNTER — Other Ambulatory Visit: Payer: Self-pay | Admitting: Internal Medicine

## 2020-05-14 ENCOUNTER — Other Ambulatory Visit: Payer: Self-pay | Admitting: Internal Medicine

## 2020-05-14 DIAGNOSIS — Z1231 Encounter for screening mammogram for malignant neoplasm of breast: Secondary | ICD-10-CM

## 2020-05-18 ENCOUNTER — Telehealth: Payer: Self-pay | Admitting: *Deleted

## 2020-05-18 ENCOUNTER — Other Ambulatory Visit: Payer: Self-pay | Admitting: Internal Medicine

## 2020-05-18 DIAGNOSIS — Z86010 Personal history of colonic polyps: Secondary | ICD-10-CM

## 2020-05-18 NOTE — Telephone Encounter (Signed)
needs hospital colon for hx colon polyps due to bmi greater than 50; last used moviprep pt with liver cancer in 2017  Patient has agreed to have colonoscopy at [facility] hospital with Dr Gerrit Heck (ok with Dr Hilarie Fredrickson as Dr C. Has openings in June and Dr Mamie Nick may not have openings until late August or September). Her colonoscopy is set for 06/06/20 at 945 am with 8:15 am arrival.  She is scheduled for COVID testing at our [place] location on Saturday 06/02/20 at 10:25 am.  Patient is scheduled for nurse preivist on 05/31/20 at 11:00 am. She verbalizes understanding of times/dates/locations for all upcoming appointments.

## 2020-05-20 ENCOUNTER — Other Ambulatory Visit: Payer: Self-pay | Admitting: Internal Medicine

## 2020-05-22 ENCOUNTER — Encounter: Payer: Self-pay | Admitting: Internal Medicine

## 2020-05-22 ENCOUNTER — Other Ambulatory Visit: Payer: Self-pay

## 2020-05-22 ENCOUNTER — Ambulatory Visit (INDEPENDENT_AMBULATORY_CARE_PROVIDER_SITE_OTHER): Payer: Medicare Other | Admitting: Internal Medicine

## 2020-05-22 DIAGNOSIS — K7460 Unspecified cirrhosis of liver: Secondary | ICD-10-CM

## 2020-05-22 DIAGNOSIS — D126 Benign neoplasm of colon, unspecified: Secondary | ICD-10-CM | POA: Diagnosis not present

## 2020-05-22 DIAGNOSIS — K7581 Nonalcoholic steatohepatitis (NASH): Secondary | ICD-10-CM

## 2020-05-22 DIAGNOSIS — C220 Liver cell carcinoma: Secondary | ICD-10-CM

## 2020-05-22 DIAGNOSIS — I10 Essential (primary) hypertension: Secondary | ICD-10-CM

## 2020-05-22 LAB — BASIC METABOLIC PANEL
BUN: 20 mg/dL (ref 6–23)
CO2: 23 mEq/L (ref 19–32)
Calcium: 9.3 mg/dL (ref 8.4–10.5)
Chloride: 110 mEq/L (ref 96–112)
Creatinine, Ser: 0.87 mg/dL (ref 0.40–1.20)
GFR: 77.75 mL/min (ref >60.00)
Glucose, Bld: 105 mg/dL — ABNORMAL HIGH (ref 70–99)
Potassium: 4.1 mEq/L (ref 3.5–5.1)
Sodium: 142 mEq/L (ref 135–145)

## 2020-05-22 LAB — CBC WITH DIFFERENTIAL/PLATELET
Basophils Absolute: 0 10*3/uL (ref 0.0–0.1)
Basophils Relative: 0.8 % (ref 0.0–3.0)
Eosinophils Absolute: 0.2 10*3/uL (ref 0.0–0.7)
Eosinophils Relative: 4.6 % (ref 0.0–5.0)
HCT: 38.7 % (ref 36.0–46.0)
Hemoglobin: 12.3 g/dL (ref 12.0–15.0)
Lymphocytes Relative: 31.5 % (ref 12.0–46.0)
Lymphs Abs: 1.5 10*3/uL (ref 0.7–4.0)
MCHC: 31.8 g/dL (ref 30.0–36.0)
MCV: 81.7 fl (ref 78.0–100.0)
Monocytes Absolute: 0.6 10*3/uL (ref 0.1–1.0)
Monocytes Relative: 11.6 % (ref 3.0–12.0)
Neutro Abs: 2.5 10*3/uL (ref 1.4–7.7)
Neutrophils Relative %: 51.5 % (ref 43.0–77.0)
Platelets: 201 10*3/uL (ref 150.0–400.0)
RBC: 4.74 Mil/uL (ref 3.87–5.11)
RDW: 14.7 % (ref 11.5–15.5)
WBC: 4.9 10*3/uL (ref 4.0–10.5)

## 2020-05-22 LAB — URINALYSIS, ROUTINE W REFLEX MICROSCOPIC
Bilirubin Urine: NEGATIVE
Ketones, ur: NEGATIVE
Nitrite: NEGATIVE
Specific Gravity, Urine: 1.025 (ref 1.000–1.030)
Total Protein, Urine: 100 — AB
Urine Glucose: NEGATIVE
Urobilinogen, UA: 0.2 (ref 0.0–1.0)
pH: 6 (ref 5.0–8.0)

## 2020-05-22 LAB — LIPID PANEL
Cholesterol: 156 mg/dL (ref 0–200)
HDL: 43.3 mg/dL (ref >39.00)
LDL Cholesterol: 94 mg/dL (ref 0–99)
NonHDL: 112.55
Total CHOL/HDL Ratio: 4
Triglycerides: 93 mg/dL (ref 0.0–149.0)
VLDL: 18.6 mg/dL (ref 0.0–40.0)

## 2020-05-22 LAB — HEPATIC FUNCTION PANEL
ALT: 11 U/L (ref 0–35)
AST: 16 U/L (ref 0–37)
Albumin: 4.3 g/dL (ref 3.5–5.2)
Alkaline Phosphatase: 94 U/L (ref 39–117)
Bilirubin, Direct: 0.1 mg/dL (ref 0.0–0.3)
Total Bilirubin: 0.3 mg/dL (ref 0.2–1.2)
Total Protein: 7.4 g/dL (ref 6.0–8.3)

## 2020-05-22 LAB — PROTIME-INR
INR: 1 ratio (ref 0.8–1.0)
Prothrombin Time: 10.8 s (ref 9.6–13.1)

## 2020-05-22 LAB — TSH: TSH: 1.37 u[IU]/mL (ref 0.35–4.50)

## 2020-05-22 MED ORDER — POTASSIUM CHLORIDE CRYS ER 20 MEQ PO TBCR: 20.0000 meq | EXTENDED_RELEASE_TABLET | Freq: Every day | ORAL | 1 refills | Status: DC

## 2020-05-22 MED ORDER — PANTOPRAZOLE SODIUM 40 MG PO TBEC: 40.0000 mg | DELAYED_RELEASE_TABLET | Freq: Every day | ORAL | 3 refills | Status: DC

## 2020-05-22 MED ORDER — MELOXICAM 7.5 MG PO TABS: ORAL_TABLET | ORAL | 3 refills | Status: DC

## 2020-05-22 MED ORDER — LOSARTAN POTASSIUM 100 MG PO TABS: 100.0000 mg | ORAL_TABLET | Freq: Every day | ORAL | 3 refills | Status: DC

## 2020-05-22 MED ORDER — FUROSEMIDE 20 MG PO TABS: 20.0000 mg | ORAL_TABLET | Freq: Every day | ORAL | 3 refills | Status: DC

## 2020-05-22 NOTE — Assessment & Plan Note (Signed)
Under surveilance

## 2020-05-22 NOTE — Assessment & Plan Note (Signed)
LFTs 

## 2020-05-22 NOTE — Assessment & Plan Note (Signed)
Colonoscopy due 06/06/20

## 2020-05-22 NOTE — Assessment & Plan Note (Signed)
Wt Readings from Last 3 Encounters:  05/22/20 (!) 304 lb (137.9 kg)  11/23/19 300 lb (136.1 kg)  08/23/19 295 lb (133.8 kg)  wt loss discussed

## 2020-05-22 NOTE — Assessment & Plan Note (Signed)
Losartan, Norvasc, Lasix 

## 2020-05-22 NOTE — Progress Notes (Signed)
Subjective:  Patient ID: Samantha [name], female    DOB: 09/10/1951  Age: 69 y.o. MRN: [id number]  CC: No chief complaint on file.   HPI Toia Chatelain presents for polyps, HTN, liver cancer, GERD f/u  Outpatient Medications Prior to Visit  Medication Sig Dispense Refill  . aspirin EC 81 MG tablet Take 81 mg by mouth daily.    Marland Kitchen BLACK COHOSH EXTRACT PO Take 1 tablet by mouth daily.    . Cholecalciferol (EQL VITAMIN D3) 1000 UNITS tablet Take 1,000 Units by mouth daily.     . EQ LORATADINE 10 MG tablet Take 1 tablet by mouth once daily 90 tablet 0  . glucosamine-chondroitin 500-400 MG tablet Take 1 tablet by mouth daily.    . Multiple Vitamin (MULTIVITAMIN WITH MINERALS) TABS tablet Take 1 tablet by mouth daily.     . promethazine (PHENERGAN) 12.5 MG tablet Take 1 tablet (12.5 mg total) by mouth every 6 (six) hours as needed for nausea or vomiting. 30 tablet 0  . saccharomyces boulardii (FLORASTOR) 250 MG capsule Take 1 capsule (250 mg total) by mouth 2 (two) times daily. 30 capsule 0  . vitamin C (ASCORBIC ACID) 500 MG tablet Take 500 mg by mouth daily.    . furosemide (LASIX) 20 MG tablet Take 1-2 tablets (20-40 mg total) by mouth daily. (Patient taking differently: Take 20 mg by mouth at bedtime. ) 180 tablet 3  . losartan (COZAAR) 100 MG tablet Take 1 tablet (100 mg total) by mouth daily. 90 tablet 3  . meloxicam (MOBIC) 7.5 MG tablet TAKE 1 TO 2 TABLETS BY MOUTH ONCE DAILY AS NEEDED FOR PAIN 60 tablet 3  . pantoprazole (PROTONIX) 40 MG tablet Take 1 tablet (40 mg total) by mouth daily. 90 tablet 3  . potassium chloride SA (KLOR-CON) 20 MEQ tablet Take 1 tablet by mouth once daily 90 tablet 1  . ciprofloxacin (CIPRO) 250 MG tablet Take 1 tablet (250 mg total) by mouth 2 (two) times daily. (Patient not taking: Reported on 05/22/2020) 6 tablet 1   No facility-administered medications prior to visit.    ROS: Review of Systems  Constitutional: Positive for unexpected weight  change. Negative for activity change, appetite change, chills and fatigue.  HENT: Negative for congestion, mouth sores and sinus pressure.   Eyes: Negative for visual disturbance.  Respiratory: Negative for cough and chest tightness.   Gastrointestinal: Negative for abdominal pain and nausea.  Genitourinary: Negative for difficulty urinating, frequency and vaginal pain.  Musculoskeletal: Positive for arthralgias and gait problem. Negative for back pain.  Skin: Negative for pallor and rash.  Neurological: Negative for dizziness, tremors, weakness, numbness and headaches.  Psychiatric/Behavioral: Negative for confusion and sleep disturbance.    Objective:  BP 140/88 (BP Location: Left Arm, Patient Position: Sitting, Cuff Size: Large)   Pulse 91   Temp 98.5 F (36.9 C) (Oral)   Ht 5\' 1"  (1.549 m)   Wt (!) 304 lb (137.9 kg)   SpO2 99%   BMI 57.44 kg/m   BP Readings from Last 3 Encounters:  05/22/20 140/88  11/23/19 128/82  08/23/19 136/82    Wt Readings from Last 3 Encounters:  05/22/20 (!) 304 lb (137.9 kg)  11/23/19 300 lb (136.1 kg)  08/23/19 295 lb (133.8 kg)    Physical Exam Constitutional:      General: She is not in acute distress.    Appearance: She is well-developed. She is obese.  HENT:     Head:  Normocephalic.     Right Ear: External ear normal.     Left Ear: External ear normal.     Nose: Nose normal.  Eyes:     General:        Right eye: No discharge.        Left eye: No discharge.     Conjunctiva/sclera: Conjunctivae normal.     Pupils: Pupils are equal, round, and reactive to light.  Neck:     Thyroid: No thyromegaly.     Vascular: No JVD.     Trachea: No tracheal deviation.  Cardiovascular:     Rate and Rhythm: Normal rate and regular rhythm.     Heart sounds: Normal heart sounds.  Pulmonary:     Effort: No respiratory distress.     Breath sounds: No stridor. No wheezing.  Abdominal:     General: Bowel sounds are normal. There is no distension.      Palpations: Abdomen is soft. There is no mass.     Tenderness: There is no abdominal tenderness. There is no guarding or rebound.  Musculoskeletal:        General: No tenderness.     Cervical back: Normal range of motion and neck supple.  Lymphadenopathy:     Cervical: No cervical adenopathy.  Skin:    Findings: No erythema or rash.  Neurological:     Cranial Nerves: No cranial nerve deficit.     Motor: No abnormal muscle tone.     Coordination: Coordination normal.     Deep Tendon Reflexes: Reflexes normal.  Psychiatric:        Behavior: Behavior normal.        Thought Content: Thought content normal.        Judgment: Judgment normal.     Lab Results  Component Value Date   WBC 4.8 08/19/2019   HGB 11.4 (L) 08/19/2019   HCT 37.9 08/19/2019   PLT 276 08/19/2019   GLUCOSE 106 (H) 08/19/2019   CHOL 154 10/20/2017   TRIG 132.0 10/20/2017   HDL 44.80 10/20/2017   LDLCALC 82 10/20/2017   ALT 15 08/17/2019   AST 19 08/17/2019   NA 139 08/19/2019   K 3.9 08/19/2019   CL 105 08/19/2019   CREATININE 0.82 08/19/2019   BUN 20 08/19/2019   CO2 25 08/19/2019   TSH 1.91 10/20/2017   INR 1.0 05/27/2018   HGBA1C 6.0 (H) 08/18/2019    IR Radiologist Eval & Mgmt  Result Date: 03/13/2020 Please refer to notes tab for details about interventional procedure. (Op Note)   Assessment & Plan:   There are no diagnoses linked to this encounter.   Meds ordered this encounter  Medications  . meloxicam (MOBIC) 7.5 MG tablet    Sig: TAKE 1 TO 2 TABLETS BY MOUTH ONCE DAILY AS NEEDED FOR PAIN    Dispense:  60 tablet    Refill:  3  . pantoprazole (PROTONIX) 40 MG tablet    Sig: Take 1 tablet (40 mg total) by mouth daily.    Dispense:  90 tablet    Refill:  3  . potassium chloride SA (KLOR-CON) 20 MEQ tablet    Sig: Take 1 tablet (20 mEq total) by mouth daily.    Dispense:  90 tablet    Refill:  1  . losartan (COZAAR) 100 MG tablet    Sig: Take 1 tablet (100 mg total) by mouth  daily.    Dispense:  90 tablet    Refill:  3  . furosemide (LASIX) 20 MG tablet    Sig: Take 1-2 tablets (20-40 mg total) by mouth daily.    Dispense:  180 tablet    Refill:  3     Follow-up: No follow-ups on file.  Walker Kehr, MD

## 2020-05-31 ENCOUNTER — Encounter: Payer: Self-pay | Admitting: Gastroenterology

## 2020-05-31 ENCOUNTER — Other Ambulatory Visit: Payer: Self-pay

## 2020-05-31 ENCOUNTER — Ambulatory Visit (AMBULATORY_SURGERY_CENTER): Payer: Self-pay | Admitting: *Deleted

## 2020-05-31 VITALS — Ht 61.0 in | Wt 303.4 lb

## 2020-05-31 DIAGNOSIS — Z01818 Encounter for other preprocedural examination: Secondary | ICD-10-CM

## 2020-05-31 DIAGNOSIS — Z86010 Personal history of colonic polyps: Secondary | ICD-10-CM

## 2020-05-31 NOTE — Progress Notes (Signed)
Patient denies any allergies to egg or soy products. Patient denies complications with anesthesia/sedation.  Patient denies oxygen use at home and denies diet medications. Emmi instructions for colonoscopy explained and given to patient.  Procedure scheduled at [facility].  Patient informed that she will be getting several calls from [facility] concerning her colonoscopy procedure.

## 2020-06-02 ENCOUNTER — Other Ambulatory Visit (HOSPITAL_COMMUNITY)
Admission: RE | Admit: 2020-06-02 | Discharge: 2020-06-02 | Disposition: A | Discharge status: Home / Self Care | Payer: Medicare Other | Source: Ambulatory Visit | Attending: Gastroenterology | Admitting: Gastroenterology

## 2020-06-02 DIAGNOSIS — Z20822 Contact with and (suspected) exposure to covid-19: Secondary | ICD-10-CM | POA: Diagnosis not present

## 2020-06-02 DIAGNOSIS — Z01812 Encounter for preprocedural laboratory examination: Secondary | ICD-10-CM | POA: Insufficient documentation

## 2020-06-02 LAB — SARS CORONAVIRUS 2 (TAT 6-24 HRS): SARS Coronavirus 2: NEGATIVE

## 2020-06-06 ENCOUNTER — Other Ambulatory Visit: Payer: Self-pay

## 2020-06-06 ENCOUNTER — Encounter (HOSPITAL_COMMUNITY): Payer: Self-pay | Admitting: Gastroenterology

## 2020-06-06 ENCOUNTER — Ambulatory Visit (HOSPITAL_COMMUNITY)
Admission: RE | Admit: 2020-06-06 | Discharge: 2020-06-06 | Disposition: A | Discharge status: Home / Self Care | Payer: Medicare Other | Attending: Gastroenterology | Admitting: Gastroenterology

## 2020-06-06 ENCOUNTER — Encounter (HOSPITAL_COMMUNITY)
Admission: RE | Disposition: A | Discharge status: Home / Self Care | Payer: Self-pay | Source: Home / Self Care | Attending: Gastroenterology

## 2020-06-06 ENCOUNTER — Ambulatory Visit (HOSPITAL_COMMUNITY): Payer: Medicare Other | Admitting: Certified Registered Nurse Anesthetist

## 2020-06-06 DIAGNOSIS — Z860100 Personal history of colon polyps, unspecified: Secondary | ICD-10-CM

## 2020-06-06 DIAGNOSIS — E785 Hyperlipidemia, unspecified: Secondary | ICD-10-CM | POA: Insufficient documentation

## 2020-06-06 DIAGNOSIS — Z87891 Personal history of nicotine dependence: Secondary | ICD-10-CM | POA: Insufficient documentation

## 2020-06-06 DIAGNOSIS — K219 Gastro-esophageal reflux disease without esophagitis: Secondary | ICD-10-CM | POA: Insufficient documentation

## 2020-06-06 DIAGNOSIS — K760 Fatty (change of) liver, not elsewhere classified: Secondary | ICD-10-CM | POA: Insufficient documentation

## 2020-06-06 DIAGNOSIS — D125 Benign neoplasm of sigmoid colon: Secondary | ICD-10-CM | POA: Diagnosis not present

## 2020-06-06 DIAGNOSIS — Z6841 Body Mass Index (BMI) 40.0 and over, adult: Secondary | ICD-10-CM | POA: Diagnosis not present

## 2020-06-06 DIAGNOSIS — Z8505 Personal history of malignant neoplasm of liver: Secondary | ICD-10-CM | POA: Diagnosis not present

## 2020-06-06 DIAGNOSIS — M170 Bilateral primary osteoarthritis of knee: Secondary | ICD-10-CM | POA: Insufficient documentation

## 2020-06-06 DIAGNOSIS — K6389 Other specified diseases of intestine: Secondary | ICD-10-CM | POA: Diagnosis not present

## 2020-06-06 DIAGNOSIS — K5730 Diverticulosis of large intestine without perforation or abscess without bleeding: Secondary | ICD-10-CM | POA: Insufficient documentation

## 2020-06-06 DIAGNOSIS — I10 Essential (primary) hypertension: Secondary | ICD-10-CM | POA: Diagnosis not present

## 2020-06-06 DIAGNOSIS — Z8619 Personal history of other infectious and parasitic diseases: Secondary | ICD-10-CM | POA: Diagnosis not present

## 2020-06-06 DIAGNOSIS — Z7982 Long term (current) use of aspirin: Secondary | ICD-10-CM | POA: Insufficient documentation

## 2020-06-06 DIAGNOSIS — K635 Polyp of colon: Secondary | ICD-10-CM

## 2020-06-06 DIAGNOSIS — K648 Other hemorrhoids: Secondary | ICD-10-CM

## 2020-06-06 DIAGNOSIS — K7460 Unspecified cirrhosis of liver: Secondary | ICD-10-CM | POA: Insufficient documentation

## 2020-06-06 HISTORY — PX: POLYPECTOMY: SHX5525

## 2020-06-06 HISTORY — PX: COLONOSCOPY WITH PROPOFOL: SHX5780

## 2020-06-06 SURGERY — COLONOSCOPY WITH PROPOFOL
Anesthesia: Monitor Anesthesia Care

## 2020-06-06 MED ORDER — PROPOFOL 500 MG/50ML IV EMUL
INTRAVENOUS | Status: DC | PRN
  Administered 2020-06-06: 120 ug/kg/min via INTRAVENOUS

## 2020-06-06 MED ORDER — LACTATED RINGERS IV SOLN: INTRAVENOUS | Status: DC | PRN

## 2020-06-06 MED ORDER — LIDOCAINE 2% (20 MG/ML) 5 ML SYRINGE
INTRAMUSCULAR | Status: DC | PRN
  Administered 2020-06-06: 80 mg via INTRAVENOUS

## 2020-06-06 MED ORDER — SODIUM CHLORIDE 0.9 % IV SOLN: INTRAVENOUS | Status: DC

## 2020-06-06 MED ORDER — PROPOFOL 10 MG/ML IV BOLUS
INTRAVENOUS | Status: DC | PRN
  Administered 2020-06-06: 30 mg via INTRAVENOUS

## 2020-06-06 MED ORDER — LACTATED RINGERS IV SOLN: INTRAVENOUS | Status: DC

## 2020-06-06 MED ORDER — PHENYLEPHRINE 40 MCG/ML (10ML) SYRINGE FOR IV PUSH (FOR BLOOD PRESSURE SUPPORT)
PREFILLED_SYRINGE | INTRAVENOUS | Status: DC | PRN
  Administered 2020-06-06 (×3): 80 ug via INTRAVENOUS

## 2020-06-06 SURGICAL SUPPLY — 22 items

## 2020-06-06 NOTE — Op Note (Signed)
[facility] Patient Name: Samantha [name] Procedure Date: 06/06/2020 MRN: [id number] Attending MD: Gerrit Heck , MD Date of Birth: 05/17/1951 CSN: [id number] Age: 69 Admit Type: Outpatient Procedure:                Colonoscopy Indications:              Surveillance: Personal history of adenomatous                            polyps on last colonoscopy 5 years ago                           - Colonoscopy (12/2014) Melanosis coli, transverse                            tubular adenoma x2, sigmoid diverticulosis,                            internal hemorrhoids                           - Colonoscopy (11/2011): Multiple adenomas in                            cecum, diverticulosis, melanosis coli Providers:                Gerrit Heck, MD, Doristine Johns, RN, Truddie Coco, RN Referring MD:             Lajuan Lines. Pyrtle, MD Medicines:                Monitored Anesthesia Care Complications:            No immediate complications. Estimated Blood Loss:     Estimated blood loss was minimal. Procedure:                Pre-Anesthesia Assessment:                           - Prior to the procedure, a History and Physical                            was performed, and patient medications and                            allergies were reviewed. The patient's tolerance of                            previous anesthesia was also reviewed. The risks                            and benefits of the procedure and the sedation                            options and risks were discussed with the patient.  All questions were answered, and informed consent                            was obtained. Prior Anticoagulants: The patient has                            taken no previous anticoagulant or antiplatelet                            agents. ASA Grade Assessment: III - A patient with                            severe systemic disease. After reviewing  the risks                            and benefits, the patient was deemed in                            satisfactory condition to undergo the procedure.                           After obtaining informed consent, the colonoscope                            was passed under direct vision. Throughout the                            procedure, the patient's blood pressure, pulse, and                            oxygen saturations were monitored continuously. The                            CF-HQ190L ([id number]) Olympus colonoscope was                            introduced through the anus and advanced to the the                            cecum, identified by appendiceal orifice and                            ileocecal valve. The colonoscopy was technically                            difficult and complex due to poor bowel prep. The                            patient tolerated the procedure well. The quality                            of the bowel preparation was poor. The ileocecal  valve, appendiceal orifice, and rectum were                            photographed. Scope In: 10:06:35 AM Scope Out: 10:23:44 AM Scope Withdrawal Time: 0 hours 7 minutes 34 seconds  Total Procedure Duration: 0 hours 17 minutes 9 seconds  Findings:      The perianal and digital rectal examinations were normal.      Multiple small-mouthed diverticula were found in the sigmoid colon.      A diffuse area of melanosis was found in the entire colon. This was       previously biopsied.      A large amount of semi-solid solid stool and solid food debris was found       in the entire colon, precluding visualization. Lavage of the area was       performed using copious amounts of tap water, resulting in incomplete       clearance with continued poor visualization. The colonoscope was clogged       with solid debris several times, again precluding adequate lavage and       visualization of the  luminal surface.      A 3 mm polyp was found in the sigmoid colon. The polyp was sessile. The       polyp was removed with a cold biopsy forceps. Resection and retrieval       were complete. Estimated blood loss was minimal.      Non-bleeding internal hemorrhoids were found during retroflexion. The       hemorrhoids were small. Impression:               - Preparation of the colon was poor.                           - Diverticulosis in the sigmoid colon.                           - Melanosis in the colon.                           - Stool, solid food debris in the entire examined                            colon.                           - One 3 mm polyp in the sigmoid colon, removed with                            a cold biopsy forceps. Resected and retrieved.                           - Non-bleeding internal hemorrhoids. Moderate Sedation:      Not Applicable - Patient had care per Anesthesia. Recommendation:           - Patient has a contact number available for                            emergencies. The signs and symptoms of potential  delayed complications were discussed with the                            patient. Return to normal activities tomorrow.                            Written discharge instructions were provided to the                            patient.                           - Resume previous diet.                           - Continue present medications.                           - Await pathology results.                           - Repeat colonoscopy at the next available                            appointment because the bowel preparation was poor                            and for surveillance.                           - Return to GI clinic PRN. Procedure Code(s):        --- Professional ---                           [phone], Colonoscopy, flexible; with biopsy, single                            or multiple Diagnosis Code(s):         --- Professional ---                           Z86.010, Personal history of colonic polyps                           K64.8, Other hemorrhoids                           K63.89, Other specified diseases of intestine                           K63.5, Polyp of colon                           K57.30, Diverticulosis of large intestine without                            perforation or abscess without bleeding CPT copyright 2019 [employer]. All  rights reserved. The codes documented in this report are preliminary and upon coder review may  be revised to meet current compliance requirements. Gerrit Heck, MD 06/06/2020 10:36:01 AM Number of Addenda: 0

## 2020-06-06 NOTE — Interval H&P Note (Signed)
History and Physical Interval Note:  06/06/2020 8:36 AM  Samantha [name]  has presented today for surgery, with the diagnosis of hx colon polyps.  The various methods of treatment have been discussed with the patient and family. After consideration of risks, benefits and other options for treatment, the patient has consented to  Procedure(s): COLONOSCOPY WITH PROPOFOL (N/A) as a surgical intervention.  The patient's history has been reviewed, patient examined, no change in status, stable for surgery.  I have reviewed the patient's chart and labs.  Questions were answered to the patient's satisfaction.     Dominic Pea Abrahim Sargent

## 2020-06-06 NOTE — Discharge Instructions (Signed)

## 2020-06-06 NOTE — Anesthesia Postprocedure Evaluation (Signed)
Anesthesia Post Note  Patient: Parrish Daddario  Procedure(s) Performed: COLONOSCOPY WITH PROPOFOL (N/A ) POLYPECTOMY     Patient location during evaluation: PACU Anesthesia Type: MAC Level of consciousness: awake and alert Pain management: pain level controlled Vital Signs Assessment: post-procedure vital signs reviewed and stable Respiratory status: spontaneous breathing, nonlabored ventilation, respiratory function stable and patient connected to nasal cannula oxygen Cardiovascular status: stable and blood pressure returned to baseline Postop Assessment: no apparent nausea or vomiting Anesthetic complications: no    Last Vitals:  Vitals:   06/06/20 1030 06/06/20 1040  BP: 121/74 115/64  Pulse: 70 79  Resp: 19 (!) 25  Temp: 36.5 C   SpO2: 100% 99%    Last Pain:  Vitals:   06/06/20 1040  TempSrc:   PainSc: 0-No pain                 Marieme Mcmackin

## 2020-06-06 NOTE — Anesthesia Preprocedure Evaluation (Addendum)
Anesthesia Evaluation  Patient identified by MRN, date of birth, ID band Patient awake    Reviewed: Allergy & Precautions, NPO status , Patient's Chart, lab work & pertinent test results  Airway Mallampati: II  TM Distance: >3 FB Neck ROM: Full    Dental  (+) Dental Advisory Given, Partial Upper, Partial Lower, Poor Dentition, Missing, Chipped Permanent upper and lower partials:   Pulmonary former smoker,    Pulmonary exam normal breath sounds clear to auscultation       Cardiovascular hypertension, Pt. on medications Normal cardiovascular exam Rhythm:Regular Rate:Normal     Neuro/Psych negative neurological ROS     GI/Hepatic GERD  Medicated and Controlled,(+) Cirrhosis       , Hepatitis -, CLiver cancer    Endo/Other  Morbid obesity  Renal/GU negative Renal ROS     Musculoskeletal negative musculoskeletal ROS (+)   Abdominal   Peds  Hematology  (+) anemia ,   Anesthesia Other Findings Day of surgery medications reviewed with the patient.  Reproductive/Obstetrics                            Anesthesia Physical  Anesthesia Plan  ASA: III  Anesthesia Plan: MAC   Post-op Pain Management:    Induction: Intravenous  PONV Risk Score and Plan: 2 and Propofol infusion and Treatment may vary due to age or medical condition  Airway Management Planned: Nasal Cannula and Natural Airway  Additional Equipment:   Intra-op Plan:   Post-operative Plan:   Informed Consent: I have reviewed the patients History and Physical, chart, labs and discussed the procedure including the risks, benefits and alternatives for the proposed anesthesia with the patient or authorized representative who has indicated his/her understanding and acceptance.     Dental advisory given  Plan Discussed with: CRNA and Anesthesiologist  Anesthesia Plan Comments:         Anesthesia Quick Evaluation

## 2020-06-06 NOTE — Transfer of Care (Signed)
Immediate Anesthesia Transfer of Care Note  Patient: Samantha [name]  Procedure(s) Performed: COLONOSCOPY WITH PROPOFOL (N/A ) POLYPECTOMY  Patient Location: Endoscopy Unit  Anesthesia Type:MAC  Level of Consciousness: drowsy and responds to stimulation  Airway & Oxygen Therapy: Patient Spontanous Breathing and Patient connected to face mask oxygen  Post-op Assessment: Report given to RN and Post -op Vital signs reviewed and stable  Post vital signs: Reviewed and stable  Last Vitals:  Vitals Value Taken Time  BP 121/74 06/06/20 1030  Temp    Pulse 72 06/06/20 1031  Resp 20 06/06/20 1031  SpO2 100 % 06/06/20 1031  Vitals shown include unvalidated device data.  Last Pain:  Vitals:   06/06/20 0837  TempSrc: Oral  PainSc: 0-No pain         Complications: No apparent anesthesia complications

## 2020-06-06 NOTE — H&P (Signed)
P  Chief Complaint:    History of colon polyps  Referring Physician: Dr. Hilarie Fredrickson  HPI:     Patient is a 69 y.o. female with a history of hepatitis C status post treatment with SVR, history of [place] in December 2016 status post TACE and microwave ablation in March 2017, history of tubular adenoma of the colon in 2016, hyperlipidemia, hypertension, obesity (BMI 57.3) presenting to the [facility] Endoscopy unit for colonoscopy today for ongoing polyp surveillance.  She is followed by [facility].  She is otherwise without active GI symptoms.  Last colonoscopy was 12/2014 by Dr. Hilarie Fredrickson notable for melanosis coli, lipomatous appearing IC valve, 2 small (3-5 mm) tubular adenomas, sigmoid diverticulosis, and internal hemorrhoids, with recommendation repeat in 5 years for ongoing surveillance.   Review of systems:     No chest pain, no SOB, no fevers, no urinary sx   Past Medical History:  Diagnosis Date  . Allergy    seasonal  . Cataract    bilateral - MD just watching  . Cirrhosis ([place]) 2018  . Diverticulosis   . Edema   . Elevated glucose    no longer an issue 03-18-16  . Fatty liver 2017  . GERD (gastroesophageal reflux disease)   . H/O seasonal allergies   . Hepatitis C    hepatitis c completed  tx march 2017  . Hyperlipidemia 2011  . Hypertension   . LBP (low back pain)   . Liver cancer ([place]) 2017   IN REMISSION. WHEN IDAGNONED SIZE OF GRAPEFRUIT   . Morbid obesity ([place])   . Osteoarthritis    B knees  . Right middle lobe pulmonary nodule    resolved 2018  . Uterine fibroid   . Wears glasses     Patient's surgical history, family medical history, social history, medications and allergies were all reviewed in Epic    Current Facility-Administered Medications  Medication Dose Route Frequency Provider Last Rate Last Admin  . 0.9 %  sodium chloride infusion   Intravenous Continuous Pyrtle, Lajuan Lines, MD      . lactated ringers infusion   Intravenous  Continuous Jancie Kercher V, DO        Physical Exam:     There were no vitals taken for this visit.  GENERAL:  Pleasant female in NAD PSYCH: : Cooperative, normal affect EENT:  conjunctiva pink, mucous membranes moist, neck supple without masses CARDIAC:  RRR, no murmur heard, no peripheral edema PULM: Normal respiratory effort, lungs CTA bilaterally, no wheezing ABDOMEN:  Nondistended, soft, nontender. No obvious masses, no hepatomegaly,  normal bowel sounds SKIN:  turgor, no lesions seen Musculoskeletal:  Normal muscle tone, normal strength NEURO: Alert and oriented x 3, no focal neurologic deficits   IMPRESSION and PLAN:    1) History of adenomatous polyps 2) Obesity (BMI 57.3) 3) Chronic aspirin use 4) History of diverticulosis 5) History of internal hemorrhoids (asymptomatic)  Colonoscopy 2016 with 2 subcentimeter tubular adenomas.  Prior to that, colonoscopy in 11/2011 with several subcentimeter tubular adenomas in the cecum as well.  -Colonoscopy today for ongoing polyp surveillance.  The indications, risks, and benefits of colonoscopy were explained to the patient in detail. Risks include but are not limited to bleeding, perforation, adverse reaction to medications, and cardiopulmonary compromise. Sequelae include but are not limited to the possibility of surgery, hospitalization, and mortality. The patient verbalized understanding and wished to proceed.  [place] ,DO, FACG 06/06/2020, 8:30 AM

## 2020-06-07 ENCOUNTER — Encounter (HOSPITAL_COMMUNITY): Payer: Self-pay | Admitting: Gastroenterology

## 2020-06-07 LAB — SURGICAL PATHOLOGY

## 2020-06-08 ENCOUNTER — Encounter: Payer: Self-pay | Admitting: Gastroenterology

## 2020-06-15 ENCOUNTER — Other Ambulatory Visit: Payer: Self-pay

## 2020-06-15 ENCOUNTER — Ambulatory Visit
Admission: RE | Admit: 2020-06-15 | Discharge: 2020-06-15 | Disposition: A | Discharge status: Home / Self Care | Payer: Medicare Other | Source: Ambulatory Visit | Attending: Internal Medicine | Admitting: Internal Medicine

## 2020-06-15 DIAGNOSIS — Z1231 Encounter for screening mammogram for malignant neoplasm of breast: Secondary | ICD-10-CM | POA: Diagnosis not present

## 2020-06-21 ENCOUNTER — Telehealth: Payer: Self-pay | Admitting: *Deleted

## 2020-06-21 ENCOUNTER — Other Ambulatory Visit: Payer: Self-pay | Admitting: Gastroenterology

## 2020-06-21 DIAGNOSIS — Z86010 Personal history of colonic polyps: Secondary | ICD-10-CM

## 2020-06-21 NOTE — Telephone Encounter (Signed)
-----   Message from Larina Bras, [place] sent at 06/14/2020  3:35 PM EDT ----- Regarding: FW: [facility] case  ----- Message ----- From: Jerene Bears, MD Sent: 06/07/2020   9:21 AM EDT To: Larina Bras, CMA, Lavena Bullion, DO Subject: RE: [facility] case                               Thanks again  Carla Drape, Can reschedule with VC with 2 day prep JMP  ----- Message ----- From: Lavena Bullion, DO Sent: 06/06/2020   2:31 PM EDT To: Jerene Bears, MD Subject: [facility] case                                   Colonoscopy completed today.  Diffuse melanosis coli similar to previous colonoscopies.  Unfortunately with poor bowel prep.  I recommended repeat colonoscopy in the next 3 months or so with extended 2-day bowel prep.  I am okay if you want to schedule that with me if you still have reduced availability.  [facility]

## 2020-06-21 NOTE — Telephone Encounter (Addendum)
Patient is scheduled for colonoscopy at [facility] with Dr Gerrit Heck (Dr Hilarie Fredrickson patient) at 945 am on 07/11/20. She should arrive at 815 am for registration.  She is scheduled for COVID testing on 07/07/20 at 11 am. Previsit scheduled for 06/28/20 at 10 am. She is to have 2 day prep due to poor prep previously.   I have left a message for patient to call back.

## 2020-06-26 NOTE — Telephone Encounter (Signed)
Patient is returning your call.  

## 2020-06-26 NOTE — Telephone Encounter (Signed)
I have left message for patient to call back. 

## 2020-06-26 NOTE — Telephone Encounter (Signed)
I have spoken to patient to advise of times, dates, locations of appointments. She verbalizes understanding. I have also advised that we have within the last several days gotten email from [facility] indicating that [place] testing is no longer needed prior to hospital procedures as long as patient has had covid vaccines x 2 and is symptom free.

## 2020-06-28 ENCOUNTER — Ambulatory Visit (AMBULATORY_SURGERY_CENTER): Payer: Self-pay | Admitting: *Deleted

## 2020-06-28 ENCOUNTER — Other Ambulatory Visit: Payer: Self-pay

## 2020-06-28 VITALS — Ht 61.0 in | Wt 204.0 lb

## 2020-06-28 DIAGNOSIS — Z86010 Personal history of colonic polyps: Secondary | ICD-10-CM

## 2020-06-28 DIAGNOSIS — Z01818 Encounter for other preprocedural examination: Secondary | ICD-10-CM

## 2020-06-28 MED ORDER — SUTAB 1479-225-188 MG PO TABS: 1.0000 | ORAL_TABLET | ORAL | 0 refills | Status: DC

## 2020-06-28 NOTE — Progress Notes (Signed)
Patient is here in-person for PV. Patient denies any allergies to eggs or soy. Patient denies any changes in medical hx since last GI visit. Patient denies any problems with anesthesia/sedation. Patient denies any oxygen use at home. Patient denies taking any diet/weight loss medications or blood thinners. Patient is not being treated for MRSA or C-diff. Patient is aware of our care-partner policy and [id number] safety protocol.  COVID-19 screening test is on 07/09/2020, the pt is aware.   Prep Prescription coupon given to the patient.

## 2020-07-07 ENCOUNTER — Other Ambulatory Visit (HOSPITAL_COMMUNITY): Payer: Medicare Other

## 2020-07-09 ENCOUNTER — Other Ambulatory Visit (HOSPITAL_COMMUNITY)
Admission: RE | Admit: 2020-07-09 | Discharge: 2020-07-09 | Disposition: A | Discharge status: Home / Self Care | Payer: Medicare Other | Source: Ambulatory Visit | Attending: Gastroenterology | Admitting: Gastroenterology

## 2020-07-09 DIAGNOSIS — Z20822 Contact with and (suspected) exposure to covid-19: Secondary | ICD-10-CM | POA: Diagnosis present

## 2020-07-09 LAB — SARS CORONAVIRUS 2 (TAT 6-24 HRS): SARS Coronavirus 2: NEGATIVE

## 2020-07-10 NOTE — Anesthesia Preprocedure Evaluation (Addendum)
Anesthesia Evaluation  Patient identified by MRN, date of birth, ID band Patient awake    Reviewed: Allergy & Precautions, NPO status , Patient's Chart, lab work & pertinent test results  Airway Mallampati: II  TM Distance: >3 FB Neck ROM: Full    Dental no notable dental hx. (+) Partial Upper, Partial Lower, Poor Dentition, Chipped, Missing,    Pulmonary former smoker,    Pulmonary exam normal breath sounds clear to auscultation       Cardiovascular hypertension, Pt. on medications Normal cardiovascular exam Rhythm:Regular Rate:Normal     Neuro/Psych    GI/Hepatic GERD  ,(+) Cirrhosis       , Hepatitis -, CLiver CA   Endo/Other  Morbid obesity  Renal/GU      Musculoskeletal  (+) Arthritis ,   Abdominal (+) + obese,   Peds  Hematology   Anesthesia Other Findings   Reproductive/Obstetrics                           Anesthesia Physical Anesthesia Plan  ASA: IV  Anesthesia Plan: MAC   Post-op Pain Management:    Induction: Intravenous  PONV Risk Score and Plan: Treatment may vary due to age or medical condition  Airway Management Planned: Natural Airway and Nasal Cannula  Additional Equipment: None  Intra-op Plan:   Post-operative Plan:   Informed Consent: I have reviewed the patients History and Physical, chart, labs and discussed the procedure including the risks, benefits and alternatives for the proposed anesthesia with the patient or authorized representative who has indicated his/her understanding and acceptance.     Dental advisory given  Plan Discussed with: CRNA  Anesthesia Plan Comments:        Anesthesia Quick Evaluation

## 2020-07-11 ENCOUNTER — Ambulatory Visit (HOSPITAL_COMMUNITY): Payer: Medicare Other | Admitting: Anesthesiology

## 2020-07-11 ENCOUNTER — Ambulatory Visit (HOSPITAL_COMMUNITY)
Admission: RE | Admit: 2020-07-11 | Discharge: 2020-07-11 | Disposition: A | Discharge status: Home / Self Care | Payer: Medicare Other | Attending: Gastroenterology | Admitting: Gastroenterology

## 2020-07-11 ENCOUNTER — Other Ambulatory Visit: Payer: Self-pay

## 2020-07-11 ENCOUNTER — Encounter (HOSPITAL_COMMUNITY)
Admission: RE | Disposition: A | Discharge status: Home / Self Care | Payer: Self-pay | Source: Home / Self Care | Attending: Gastroenterology

## 2020-07-11 ENCOUNTER — Encounter (HOSPITAL_COMMUNITY): Payer: Self-pay | Admitting: Gastroenterology

## 2020-07-11 DIAGNOSIS — Z09 Encounter for follow-up examination after completed treatment for conditions other than malignant neoplasm: Secondary | ICD-10-CM | POA: Diagnosis not present

## 2020-07-11 DIAGNOSIS — I10 Essential (primary) hypertension: Secondary | ICD-10-CM | POA: Diagnosis not present

## 2020-07-11 DIAGNOSIS — M170 Bilateral primary osteoarthritis of knee: Secondary | ICD-10-CM | POA: Diagnosis not present

## 2020-07-11 DIAGNOSIS — D125 Benign neoplasm of sigmoid colon: Secondary | ICD-10-CM | POA: Diagnosis not present

## 2020-07-11 DIAGNOSIS — K6389 Other specified diseases of intestine: Secondary | ICD-10-CM | POA: Diagnosis not present

## 2020-07-11 DIAGNOSIS — Z87891 Personal history of nicotine dependence: Secondary | ICD-10-CM | POA: Diagnosis not present

## 2020-07-11 DIAGNOSIS — Z8619 Personal history of other infectious and parasitic diseases: Secondary | ICD-10-CM | POA: Insufficient documentation

## 2020-07-11 DIAGNOSIS — Z6841 Body Mass Index (BMI) 40.0 and over, adult: Secondary | ICD-10-CM | POA: Insufficient documentation

## 2020-07-11 DIAGNOSIS — Z7982 Long term (current) use of aspirin: Secondary | ICD-10-CM | POA: Insufficient documentation

## 2020-07-11 DIAGNOSIS — K7460 Unspecified cirrhosis of liver: Secondary | ICD-10-CM | POA: Insufficient documentation

## 2020-07-11 DIAGNOSIS — D122 Benign neoplasm of ascending colon: Secondary | ICD-10-CM | POA: Diagnosis not present

## 2020-07-11 DIAGNOSIS — K635 Polyp of colon: Secondary | ICD-10-CM

## 2020-07-11 DIAGNOSIS — D123 Benign neoplasm of transverse colon: Secondary | ICD-10-CM | POA: Diagnosis not present

## 2020-07-11 DIAGNOSIS — D124 Benign neoplasm of descending colon: Secondary | ICD-10-CM | POA: Insufficient documentation

## 2020-07-11 DIAGNOSIS — Z8505 Personal history of malignant neoplasm of liver: Secondary | ICD-10-CM | POA: Diagnosis not present

## 2020-07-11 DIAGNOSIS — E785 Hyperlipidemia, unspecified: Secondary | ICD-10-CM | POA: Diagnosis not present

## 2020-07-11 DIAGNOSIS — Z860100 Personal history of colon polyps, unspecified: Secondary | ICD-10-CM

## 2020-07-11 DIAGNOSIS — D120 Benign neoplasm of cecum: Secondary | ICD-10-CM | POA: Diagnosis not present

## 2020-07-11 DIAGNOSIS — K648 Other hemorrhoids: Secondary | ICD-10-CM | POA: Diagnosis not present

## 2020-07-11 DIAGNOSIS — K5730 Diverticulosis of large intestine without perforation or abscess without bleeding: Secondary | ICD-10-CM | POA: Diagnosis not present

## 2020-07-11 HISTORY — PX: BIOPSY: SHX5522

## 2020-07-11 HISTORY — PX: POLYPECTOMY: SHX5525

## 2020-07-11 HISTORY — PX: COLONOSCOPY WITH PROPOFOL: SHX5780

## 2020-07-11 SURGERY — COLONOSCOPY WITH PROPOFOL
Anesthesia: Monitor Anesthesia Care

## 2020-07-11 MED ORDER — SODIUM CHLORIDE 0.9 % IV SOLN: INTRAVENOUS | Status: DC

## 2020-07-11 MED ORDER — LACTATED RINGERS IV SOLN: INTRAVENOUS | Status: DC | PRN

## 2020-07-11 MED ORDER — LACTATED RINGERS IV SOLN
INTRAVENOUS | Status: AC | PRN
  Administered 2020-07-11: 1000 mL via INTRAVENOUS

## 2020-07-11 MED ORDER — PHENYLEPHRINE 40 MCG/ML (10ML) SYRINGE FOR IV PUSH (FOR BLOOD PRESSURE SUPPORT)
PREFILLED_SYRINGE | INTRAVENOUS | Status: DC | PRN
  Administered 2020-07-11 (×5): 80 ug via INTRAVENOUS

## 2020-07-11 MED ORDER — LIDOCAINE 2% (20 MG/ML) 5 ML SYRINGE
INTRAMUSCULAR | Status: DC | PRN
  Administered 2020-07-11: 100 mg via INTRAVENOUS

## 2020-07-11 MED ORDER — PROPOFOL 10 MG/ML IV BOLUS
INTRAVENOUS | Status: DC | PRN
  Administered 2020-07-11: 30 mg via INTRAVENOUS

## 2020-07-11 MED ORDER — PROPOFOL 500 MG/50ML IV EMUL
INTRAVENOUS | Status: DC | PRN
  Administered 2020-07-11: 120 ug/kg/min via INTRAVENOUS

## 2020-07-11 SURGICAL SUPPLY — 22 items

## 2020-07-11 NOTE — Interval H&P Note (Signed)
History and Physical Interval Note:  07/11/2020 8:48 AM  Samantha [name]  has presented today for surgery, with the diagnosis of hx colon cancer, hx colon polyps.  The various methods of treatment have been discussed with the patient and family. After consideration of risks, benefits and other options for treatment, the patient has consented to  Procedure(s): COLONOSCOPY WITH PROPOFOL (N/A) as a surgical intervention.  The patient's history has been reviewed, patient examined, no change in status, stable for surgery.  I have reviewed the patient's chart and labs.  Questions were answered to the patient's satisfaction.     Dominic Pea [name]

## 2020-07-11 NOTE — Discharge Instructions (Signed)
Monitored Anesthesia Care, Care After These instructions provide you with information about caring for yourself after your procedure. Your health care provider may also give you more specific instructions. Your treatment has been planned according to current medical practices, but problems sometimes occur. Call your health care provider if you have any problems or questions after your procedure. What can I expect after the procedure? After your procedure, you may:  Feel sleepy for several hours.  Feel clumsy and have poor balance for several hours.  Feel forgetful about what happened after the procedure.  Have poor judgment for several hours.  Feel nauseous or vomit.  Have a sore throat if you had a breathing tube during the procedure. Follow these instructions at home: For at least 24 hours after the procedure:      Have a responsible adult stay with you. It is important to have someone help care for you until you are awake and alert.  Rest as needed.  Do not: ? Participate in activities in which you could fall or become injured. ? Drive. ? Use heavy machinery. ? Drink alcohol. ? Take sleeping pills or medicines that cause drowsiness. ? Make important decisions or sign legal documents. ? Take care of children on your own. Eating and drinking  Follow the diet that is recommended by your health care provider.  If you vomit, drink water, juice, or soup when you can drink without vomiting.  Make sure you have little or no nausea before eating solid foods. General instructions  Take over-the-counter and prescription medicines only as told by your health care provider.  If you have sleep apnea, surgery and certain medicines can increase your risk for breathing problems. Follow instructions from your health care provider about wearing your sleep device: ? Anytime you are sleeping, including during daytime naps. ? While taking prescription pain medicines, sleeping medicines,  or medicines that make you drowsy.  If you smoke, do not smoke without supervision.  Keep all follow-up visits as told by your health care provider. This is important. Contact a health care provider if:  You keep feeling nauseous or you keep vomiting.  You feel light-headed.  You develop a rash.  You have a fever. Get help right away if:  You have trouble breathing. Summary  For several hours after your procedure, you may feel sleepy and have poor judgment.  Have a responsible adult stay with you for at least 24 hours or until you are awake and alert. This information is not intended to replace advice given to you by your health care provider. Make sure you discuss any questions you have with your health care provider. Document Revised: 03/15/2018 Document Reviewed: 04/06/2016 Elsevier Patient Education  [place]. Colonoscopy, Adult, Care After This sheet gives you information about how to care for yourself after your procedure. Your doctor may also give you more specific instructions. If you have problems or questions, call your doctor. What can I expect after the procedure? After the procedure, it is common to have:  A small amount of blood in your poop (stool) for 24 hours.  Some gas.  Mild cramping or bloating in your belly (abdomen). Follow these instructions at home: Eating and drinking   Drink enough fluid to keep your pee (urine) pale yellow.  Follow instructions from your doctor about what you cannot eat or drink.  Return to your normal diet as told by your doctor. Avoid heavy or fried foods that are hard to digest. Activity  Rest as told by your doctor.  Do not sit for a long time without moving. Get up to take short walks every 1-2 hours. This is important. Ask for help if you feel weak or unsteady.  Return to your normal activities as told by your doctor. Ask your doctor what activities are safe for you. To help cramping and bloating:   Try  walking around.  Put heat on your belly as told by your doctor. Use the heat source that your doctor recommends, such as a moist heat pack or a heating pad. ? Put a towel between your skin and the heat source. ? Leave the heat on for 20-30 minutes. ? Remove the heat if your skin turns bright red. This is very important if you are unable to feel pain, heat, or cold. You may have a greater risk of getting burned. General instructions  For the first 24 hours after the procedure: ? Do not drive or use machinery. ? Do not sign important documents. ? Do not drink alcohol. ? Do your daily activities more slowly than normal. ? Eat foods that are soft and easy to digest.  Take over-the-counter or prescription medicines only as told by your doctor.  Keep all follow-up visits as told by your doctor. This is important. Contact a doctor if:  You have blood in your poop 2-3 days after the procedure. Get help right away if:  You have more than a small amount of blood in your poop.  You see large clumps of tissue (blood clots) in your poop.  Your belly is swollen.  You feel like you may vomit (nauseous).  You vomit.  You have a fever.  You have belly pain that gets worse, and medicine does not help your pain. Summary  After the procedure, it is common to have a small amount of blood in your poop. You may also have mild cramping and bloating in your belly.  For the first 24 hours after the procedure, do not drive or use machinery, do not sign important documents, and do not drink alcohol.  Get help right away if you have a lot of blood in your poop, feel like you may vomit, have a fever, or have more belly pain. This information is not intended to replace advice given to you by your health care provider. Make sure you discuss any questions you have with your health care provider. Document Revised: 07/11/2019 Document Reviewed: 07/11/2019 Elsevier Patient Education  [place].

## 2020-07-11 NOTE — Anesthesia Postprocedure Evaluation (Signed)
Anesthesia Post Note  Patient: Samantha [name]  Procedure(s) Performed: COLONOSCOPY WITH PROPOFOL (N/A ) POLYPECTOMY     Patient location during evaluation: Endoscopy Anesthesia Type: MAC Level of consciousness: awake and alert Pain management: pain level controlled Vital Signs Assessment: post-procedure vital signs reviewed and stable Respiratory status: spontaneous breathing, nonlabored ventilation, respiratory function stable and patient connected to nasal cannula oxygen Cardiovascular status: blood pressure returned to baseline and stable Postop Assessment: no apparent nausea or vomiting Anesthetic complications: no   No complications documented.  Last Vitals:  Vitals:   07/11/20 1013 07/11/20 1018  BP:  124/81  Pulse:  77  Resp:  16  Temp: (!) 36.3 C   SpO2:  100%    Last Pain:  Vitals:   07/11/20 1018  TempSrc:   PainSc: 0-No pain                 Barnet Glasgow

## 2020-07-11 NOTE — H&P (Signed)
P  Chief Complaint:    History of colon polyps  Referring Physician: Dr. Hilarie Fredrickson  HPI:    Patient is a 69 y.o. female with a history of hepatitis C status post treatment with SVR, history of [place] in December 2016 status post TACE and microwave ablation in March 2017, history of tubular adenoma of the colon in 2016, hyperlipidemia, hypertension, obesity (BMI 57.3) presenting to the [facility] Endoscopy unit for colonoscopy today for ongoing polyp surveillance. Attempted repeat colonoscopy for surveillance last month, but due to poor prep, was scheduled for short interval repeat colonoscopy. She is otherwise in her usual state of health and without any complaints.   She is followed by [facility].  -Colonoscopy 06/06/20: Poor prep. Sigmoid diverticulosis, melanosis coli, 3 mm sigmoid polyp. Recommended short interval repeat.  -Colonoscopy 12/2014 by Dr. Hilarie Fredrickson notable for melanosis coli, lipomatous appearing IC valve, 2 small (3-5 mm) tubular adenomas, sigmoid diverticulosis, and internal hemorrhoids, with recommendation repeat in 5 years for ongoing surveillance. -Colonoscopy in 11/2011 with several subcentimeter tubular adenomas in the cecum   Review of systems:     No chest pain, no SOB, no fevers, no urinary sx   Past Medical History:  Diagnosis Date  . Allergy    seasonal  . Cataract    bilateral - MD just watching  . Cirrhosis ([place]) 2018  . Diverticulosis   . Edema   . Elevated glucose    no longer an issue 03-18-16  . Fatty liver 2017  . GERD (gastroesophageal reflux disease)   . H/O seasonal allergies   . Hepatitis C    hepatitis c completed  tx march 2017  . Hyperlipidemia 2011  . Hypertension   . LBP (low back pain)   . Liver cancer ([place]) 2017   IN REMISSION. WHEN IDAGNONED SIZE OF GRAPEFRUIT   . Morbid obesity ([place])   . Osteoarthritis    B knees  . Right middle lobe pulmonary nodule    resolved 2018  . Uterine fibroid   . Wears glasses      Patient's surgical history, family medical history, social history, medications and allergies were all reviewed in Epic    Current Facility-Administered Medications  Medication Dose Route Frequency Provider Last Rate Last Admin  . 0.9 %  sodium chloride infusion   Intravenous Continuous Dimetrius Montfort V, DO        Physical Exam:     BP (!) 163/94   Temp 98.1 F (36.7 C) (Oral)   Resp 16   Ht 5' (1.524 m)   Wt (!) 138.3 kg   LMP  (LMP Unknown)   SpO2 100%   BMI 59.57 kg/m   GENERAL:  Pleasant female in NAD PSYCH: : Cooperative, normal affect EENT:  conjunctiva pink, mucous membranes moist, neck supple without masses CARDIAC:  RRR, no murmur heard, no peripheral edema PULM: Normal respiratory effort, lungs CTA bilaterally, no wheezing ABDOMEN:  Nondistended, soft, nontender. No obvious masses, no hepatomegaly,  normal bowel sounds SKIN:  turgor, no lesions seen Musculoskeletal:  Normal muscle tone, normal strength NEURO: Alert and oriented x 3, no focal neurologic deficits   IMPRESSION and PLAN:    1) History of adenomatous polyps 2) Obesity (BMI 57.3) 3) Chronic aspirin use 4) History of diverticulosis 5) History of internal hemorrhoids (asymptomatic)  Colonoscopy 2016 with 2 subcentimeter tubular adenomas.  Prior to that, colonoscopy in 11/2011 with several subcentimeter tubular adenomas in the cecum as well.  Poor  prep on colonoscopy in 05/2020, with recommendation for short interval repeat.   -Colonoscopy today for ongoing polyp surveillance.  The indications, risks, and benefits of colonoscopy were explained to the patient in detail. Risks include but are not limited to bleeding, perforation, adverse reaction to medications, and cardiopulmonary compromise. Sequelae include but are not limited to the possibility of surgery, hospitalization, and mortality. The patient verbalized understanding and wished to proceed.           Lavena Bullion ,DO, FACG  07/11/2020, 8:36 AM

## 2020-07-11 NOTE — Op Note (Signed)
[facility] Patient Name: Samantha [name] Procedure Date: 07/11/2020 MRN: [id number] Attending MD: Gerrit Heck , MD Date of Birth: October 30, 1951 CSN: [id number] Age: 69 Admit Type: Outpatient Procedure:                Colonoscopy Indications:              Surveillance: Personal history of adenomatous                            polyps on last colonoscopy 5 years ago                           -Colonoscopy 06/06/20: Poor prep. Sigmoid                            diverticulosis, melanosis coli, 3 mm sigmoid polyp.                            Recommended short interval repeat.                           -Colonoscopy 12/2014: Melanosis coli, lipomatous                            appearing IC valve, 2 small (3-5 mm) tubular                            adenomas, sigmoid diverticulosis, and internal                            hemorrhoids, with recommendation repeat in 5 years                            for ongoing surveillance.                           -Colonoscopy in 11/2011: Several subcentimeter                            tubular adenomas in the cecum Providers:                Gerrit Heck, MD, Glori Bickers, RN, Elspeth Cho Tech., Technician, Caryl Pina CRNA Referring MD:              Medicines:                Monitored Anesthesia Care Complications:            No immediate complications. Estimated Blood Loss:     Estimated blood loss was minimal. Procedure:                Pre-Anesthesia Assessment:                           - Prior to the procedure, a History and Physical  was performed, and patient medications and                            allergies were reviewed. The patient's tolerance of                            previous anesthesia was also reviewed. The risks                            and benefits of the procedure and the sedation                            options and risks were discussed with the patient.                             All questions were answered, and informed consent                            was obtained. Prior Anticoagulants: The patient has                            taken no previous anticoagulant or antiplatelet                            agents. ASA Grade Assessment: IV - A patient with                            severe systemic disease that is a constant threat                            to life. After reviewing the risks and benefits,                            the patient was deemed in satisfactory condition to                            undergo the procedure.                           After obtaining informed consent, the colonoscope                            was passed under direct vision. Throughout the                            procedure, the patient's blood pressure, pulse, and                            oxygen saturations were monitored continuously. The                            [id number]-HQ190L ([id number]) Olympus colonoscope was  introduced through the anus and advanced to the the                            terminal ileum. The colonoscopy was performed                            without difficulty. The patient tolerated the                            procedure well. The quality of the bowel                            preparation was good. The terminal ileum, ileocecal                            valve, appendiceal orifice, and rectum were                            photographed. Scope In: 9:39:42 AM Scope Out: 10:05:15 AM Scope Withdrawal Time: 0 hours 22 minutes 14 seconds  Total Procedure Duration: 0 hours 25 minutes 33 seconds  Findings:      The perianal and digital rectal examinations were normal.      Four sessile polyps were found in the ascending colon (3) and cecum (1).       The polyps were 4 to 6 mm in size. These polyps were removed with a cold       snare. Resection and retrieval were complete. Estimated blood loss was        minimal.      Four sessile polyps were found in the sigmoid colon (1) and descending       colon (3). The polyps were 2 to 3 mm in size. These polyps were removed       with a cold biopsy forceps. Resection and retrieval were complete.       Estimated blood loss was minimal.      Multiple small and large-mouthed diverticula were found in the sigmoid       colon.      Non-bleeding internal hemorrhoids were found during retroflexion. The       hemorrhoids were small.      A diffuse area of melanosis was found in the entire colon.      The terminal ileum appeared normal.      The ileocecal valve was lipomatous.      Retroflexion in the right colon was performed. Polyps found in the       ascending colon on retroflexed views as described above. Impression:               - Four 4 to 6 mm polyps in the ascending colon and                            in the cecum, removed with a cold snare. Resected                            and retrieved.                           -  Four 2 to 3 mm polyps in the sigmoid colon and in                            the descending colon, removed with a cold biopsy                            forceps. Resected and retrieved.                           - Diverticulosis in the sigmoid colon.                           - Non-bleeding internal hemorrhoids.                           - Melanosis in the colon.                           - The examined portion of the ileum was normal.                           - Lipomatous ileocecal valve. Moderate Sedation:      Not Applicable - Patient had care per Anesthesia. Recommendation:           - Patient has a contact number available for                            emergencies. The signs and symptoms of potential                            delayed complications were discussed with the                            patient. Return to normal activities tomorrow.                            Written discharge instructions were provided to the                             patient.                           - Resume previous diet.                           - Continue present medications.                           - Await pathology results.                           - Repeat colonoscopy in 3 - 5 years for                            surveillance based on pathology results.                           -  Return to GI office PRN. Procedure Code(s):        --- Professional ---                           [id number], Colonoscopy, flexible; with removal of                            tumor(s), polyp(s), or other lesion(s) by snare                            technique                           45380, 31, Colonoscopy, flexible; with biopsy,                            single or multiple Diagnosis Code(s):        --- Professional ---                           K63.89, Other specified diseases of intestine                           Z86.010, Personal history of colonic polyps                           K63.5, Polyp of colon                           K64.8, Other hemorrhoids                           K57.30, Diverticulosis of large intestine without                            perforation or abscess without bleeding CPT copyright 2019 [employer] Association. All rights reserved. The codes documented in this report are preliminary and upon coder review may  be revised to meet current compliance requirements. Gerrit Heck, MD 07/11/2020 10:17:14 AM Number of Addenda: 0

## 2020-07-11 NOTE — Transfer of Care (Signed)
Immediate Anesthesia Transfer of Care Note  Patient: Samantha [name]  Procedure(s) Performed: COLONOSCOPY WITH PROPOFOL (N/A ) POLYPECTOMY  Patient Location: Endoscopy Unit  Anesthesia Type:MAC  Level of Consciousness: awake, alert , oriented and patient cooperative  Airway & Oxygen Therapy: Patient Spontanous Breathing and Patient connected to face mask oxygen  Post-op Assessment: Report given to RN, Post -op Vital signs reviewed and stable and Patient moving all extremities X 4  Post vital signs: Reviewed and stable  Last Vitals:  Vitals Value Taken Time  BP    Temp 36.3 C 07/11/20 1013  Pulse 79 07/11/20 1016  Resp 20 07/11/20 1016  SpO2 100 % 07/11/20 1016  Vitals shown include unvalidated device data.  Last Pain:  Vitals:   07/11/20 1013  TempSrc: Oral  PainSc:          Complications: No complications documented.

## 2020-07-12 LAB — SURGICAL PATHOLOGY

## 2020-07-13 ENCOUNTER — Encounter (HOSPITAL_COMMUNITY): Payer: Self-pay | Admitting: Gastroenterology

## 2020-07-13 ENCOUNTER — Encounter: Payer: Self-pay | Admitting: Gastroenterology

## 2020-07-13 ENCOUNTER — Ambulatory Visit: Payer: Medicare Other

## 2020-07-16 ENCOUNTER — Ambulatory Visit (INDEPENDENT_AMBULATORY_CARE_PROVIDER_SITE_OTHER): Payer: Medicare Other | Admitting: *Deleted

## 2020-07-16 ENCOUNTER — Other Ambulatory Visit: Payer: Self-pay

## 2020-07-16 DIAGNOSIS — Z23 Encounter for immunization: Secondary | ICD-10-CM

## 2020-07-18 LAB — TB SKIN TEST
Induration: 0 mm
TB Skin Test: NEGATIVE

## 2020-07-29 ENCOUNTER — Other Ambulatory Visit: Payer: Self-pay | Admitting: Internal Medicine

## 2020-08-08 ENCOUNTER — Other Ambulatory Visit: Payer: Self-pay | Admitting: Interventional Radiology

## 2020-08-08 DIAGNOSIS — C220 Liver cell carcinoma: Secondary | ICD-10-CM

## 2020-08-09 DIAGNOSIS — C220 Liver cell carcinoma: Secondary | ICD-10-CM | POA: Diagnosis not present

## 2020-08-09 DIAGNOSIS — K7469 Other cirrhosis of liver: Secondary | ICD-10-CM | POA: Diagnosis not present

## 2020-08-13 ENCOUNTER — Other Ambulatory Visit: Payer: Self-pay | Admitting: Internal Medicine

## 2020-08-24 ENCOUNTER — Other Ambulatory Visit: Payer: Self-pay

## 2020-08-24 ENCOUNTER — Ambulatory Visit (HOSPITAL_COMMUNITY)
Admission: RE | Admit: 2020-08-24 | Discharge: 2020-08-24 | Disposition: A | Discharge status: Home / Self Care | Payer: Medicare Other | Source: Ambulatory Visit | Attending: Interventional Radiology | Admitting: Interventional Radiology

## 2020-08-24 DIAGNOSIS — C220 Liver cell carcinoma: Secondary | ICD-10-CM | POA: Insufficient documentation

## 2020-08-24 DIAGNOSIS — K760 Fatty (change of) liver, not elsewhere classified: Secondary | ICD-10-CM | POA: Diagnosis not present

## 2020-08-24 MED ORDER — GADOXETATE DISODIUM 0.25 MMOL/ML IV SOLN
9.9000 mL | Freq: Once | INTRAVENOUS | Status: AC | PRN
  Administered 2020-08-24: 9.9 mL via INTRAVENOUS

## 2020-08-30 ENCOUNTER — Other Ambulatory Visit: Payer: Self-pay

## 2020-08-30 ENCOUNTER — Ambulatory Visit
Admission: RE | Admit: 2020-08-30 | Discharge: 2020-08-30 | Disposition: A | Discharge status: Home / Self Care | Payer: Medicare Other | Source: Ambulatory Visit | Attending: Interventional Radiology | Admitting: Interventional Radiology

## 2020-08-30 DIAGNOSIS — C220 Liver cell carcinoma: Secondary | ICD-10-CM

## 2020-08-30 DIAGNOSIS — Z9889 Other specified postprocedural states: Secondary | ICD-10-CM | POA: Diagnosis not present

## 2020-08-30 HISTORY — PX: IR RADIOLOGIST EVAL & MGMT: IMG5224

## 2020-08-30 NOTE — Progress Notes (Signed)
Chief Complaint: Patient was consulted remotely today (TeleHealth) for hepatocellular carcinoma at the request of Nara Visa.    Referring Physician(s): Dr. Truitt Merle  History of Present Illness: Samantha [name] is a 69 y.o. 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  She underwent conventional lipiodol transarterial chemo embolization on 2/23 and then subsequent CT guided thermal ablation of this lesion on 03/21/2016.  She presents today for continued follow-up evaluation. Her most recent MR imaging from August 2021demonstrates no evidence of residual or recurrent disease. She is a 4.5 years out and remains disease-free. She is feeling well and has no complaints including abdominal pain, nausea, vomiting, diarrhea, chest pain or shortness of breath.  Past Medical History:  Diagnosis Date  . Allergy    seasonal  . Cataract    bilateral - MD just watching  . Cirrhosis ([place]) 2018  . Diverticulosis   . Edema   . Elevated glucose    no longer an issue 03-18-16  . Fatty liver 2017  . GERD (gastroesophageal reflux disease)   . H/O seasonal allergies   . Hepatitis C    hepatitis c completed  tx march 2017  . Hyperlipidemia 2011  . Hypertension   . LBP (low back pain)   . Liver cancer ([place]) 2017   IN REMISSION. WHEN IDAGNONED SIZE OF GRAPEFRUIT   . Morbid obesity ([place])   . Osteoarthritis    B knees  . Right middle lobe pulmonary nodule    resolved 2018  . Uterine fibroid   . Wears glasses     Past Surgical History:  Procedure Laterality Date  . BIOPSY  07/04/2019   Procedure: BIOPSY;  Surgeon: Jerene Bears, MD;  Location: [facility];  Service: Gastroenterology;;  . BIOPSY  07/11/2020   Procedure: BIOPSY;  Surgeon: Lavena Bullion, DO;  Location: [facility];  Service: Gastroenterology;;  . CHOLECYSTECTOMY    . COLONOSCOPY N/A 01/09/2015    Procedure: COLONOSCOPY;  Surgeon: Jerene Bears, MD;  Location: [facility];  Service: Gastroenterology;  Laterality: N/A;  . COLONOSCOPY WITH PROPOFOL N/A 06/06/2020   Procedure: COLONOSCOPY WITH PROPOFOL;  Surgeon: Lavena Bullion, DO;  [facility]: [facility];  Service: Gastroenterology;  Laterality: N/A;  . COLONOSCOPY WITH PROPOFOL N/A 07/11/2020   Procedure: COLONOSCOPY WITH PROPOFOL;  Surgeon: Lavena Bullion, DO;  [facility]: [facility];  Service: Gastroenterology;  Laterality: N/A;  . ESOPHAGOGASTRODUODENOSCOPY (EGD) WITH PROPOFOL N/A 06/10/2016   Procedure: ESOPHAGOGASTRODUODENOSCOPY (EGD) WITH PROPOFOL;  Surgeon: Jerene Bears, MD;  [facility];  Service: Gastroenterology;  Laterality: N/A;  . ESOPHAGOGASTRODUODENOSCOPY (EGD) WITH PROPOFOL N/A 07/04/2019   Procedure: ESOPHAGOGASTRODUODENOSCOPY (EGD) WITH PROPOFOL;  Surgeon: Jerene Bears, MD;  [facility];  Service: Gastroenterology;  Laterality: N/A;  . HYSTEROSCOPY WITH D & C N/A 10/15/2018   Procedure: DILATATION AND CURETTAGE /HYSTEROSCOPY;  Surgeon: Aloha Gell, MD;  Location: [facility];  Service: Gynecology;  Laterality: N/A;  . IR GENERIC HISTORICAL  07/03/2016   IR RADIOLOGIST EVAL & MGMT 07/03/2016 [name], MD [facility]  . IR GENERIC HISTORICAL  11/18/2016   IR RADIOLOGIST EVAL & MGMT 11/18/2016 [name], MD [facility]  . IR RADIOLOGIST EVAL & MGMT  04/09/2017  . IR RADIOLOGIST EVAL & MGMT  11/05/2017  . IR RADIOLOGIST EVAL & MGMT  03/13/2020  . JOINT REPLACEMENT     BILATERAL KNEES  . left  shoulder arthroplasty    . POLYPECTOMY  06/06/2020   Procedure: POLYPECTOMY;  Surgeon: Lavena Bullion, DO;  [facility]: [facility];  Service: Gastroenterology;;  . POLYPECTOMY  07/11/2020   Procedure: POLYPECTOMY;  Surgeon: Lavena Bullion, DO;  Location: [facility];  Service: Gastroenterology;;  . right knee replacement  2007  . total knee replacement L  03-13-08  . TUBAL LIGATION       Allergies: Amlodipine, Benazepril hcl, and Codeine  Medications: Prior to Admission medications   Medication Sig Start Date End Date Taking? Authorizing Provider  aspirin EC 81 MG tablet Take 81 mg by mouth daily.    [provider]  bisacodyl (BISACODYL) 5 MG EC tablet Take 5 mg by mouth daily as needed for moderate constipation. Dulcolax 5 mg tab take as directed for colonoscopy prep.    [provider]  BLACK COHOSH EXTRACT PO Take 1 tablet by mouth daily.    [provider]  Cholecalciferol (EQL VITAMIN D3) 1000 UNITS tablet Take 1,000 Units by mouth daily.     [provider]  EQ LORATADINE 10 MG tablet Take 1 tablet by mouth once daily 08/14/20   Plotnikov, Evie Lacks, MD  furosemide (LASIX) 20 MG tablet Take 1-2 tablets (20-40 mg total) by mouth daily. Patient taking differently: Take 40 mg by mouth daily.  05/22/20   Plotnikov, Evie Lacks, MD  glucosamine-chondroitin 500-400 MG tablet Take 1 tablet by mouth daily.    [provider]  losartan (COZAAR) 100 MG tablet Take 1 tablet (100 mg total) by mouth daily. 05/22/20   Plotnikov, Evie Lacks, MD  meloxicam (MOBIC) 7.5 MG tablet TAKE 1 TO 2 TABLETS BY MOUTH ONCE DAILY AS NEEDED FOR PAIN Patient taking differently: Take 7.5 mg by mouth daily. TAKE 1 TABLET BY MOUTH ONCE DAILY FOR PAIN 05/22/20   Plotnikov, Evie Lacks, MD  Multiple Vitamin (MULTIVITAMIN WITH MINERALS) TABS tablet Take 1 tablet by mouth daily.     [provider]  pantoprazole (PROTONIX) 40 MG tablet Take 1 tablet by mouth once daily 07/30/20   Plotnikov, Evie Lacks, MD  Polyethylene Glycol 3350 (MIRALAX MIX-IN PAX PO) Take by mouth. Miralax 238 grams as directed for colonoscopy prep.    [provider]  potassium chloride SA (KLOR-CON) 20 MEQ tablet Take 1 tablet (20 mEq total) by mouth daily. 05/22/20   Plotnikov, Evie Lacks, MD  promethazine (PHENERGAN) 12.5 MG tablet Take 1 tablet (12.5 mg total) by mouth every 6  (six) hours as needed for nausea or vomiting. 08/09/19   Janith Lima, MD  vitamin C (ASCORBIC ACID) 500 MG tablet Take 500 mg by mouth daily.    [provider]     Family History  Problem Relation Age of Onset  . Hypertension Mother   . Ovarian cancer Mother   . Hypertension Father   . Cancer Father 8       prostate cancer   . Diabetes Father   . Diabetes Other   . Hypertension Other   . Colon cancer Neg Hx   . Esophageal cancer Neg Hx   . Rectal cancer Neg Hx   . Stomach cancer Neg Hx   . Breast cancer Neg Hx     Social History   Socioeconomic History  . Marital status: Single    Spouse name: Not on file  . Number of children: 4  . Years of education: Not on file  . Highest education level: Not on file  Occupational History  . Not on file  Tobacco Use  . Smoking status: Former Smoker    Packs/day: 0.25    Years: 20.00    Pack years: 5.00    Quit date: 12/30/2007    Years since quitting: 12.6  . Smokeless tobacco: Never Used  Vaping Use  . Vaping Use: Never used  Substance and Sexual Activity  . 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 Drug use: No  . Sexual activity: Not Currently    Birth control/protection: Post-menopausal  Other Topics Concern  . Not on file  Social History Narrative   Regular exercise: walk a few times a week   Caffeine use: none   Single, raising her young great granddaughter and watches grandchildren after school   Has a room mate to help prn   Retired from nursing tech--worked at [employer]:   . Difficulty of Paying Living Expenses: Not on file  Food Insecurity:   . Worried About Charity fundraiser in the Last Year: Not on file  . Ran Out of Food in the Last Year: Not on file  Transportation Needs:   . Lack of Transportation (Medical): Not on file  .  Lack of Transportation (Non-Medical): Not on file  Physical Activity:   . Days of Exercise per Week: Not on file  . Minutes of Exercise per Session: Not on file  Stress:   . Feeling of Stress : Not on file  Social Connections:   . Frequency of Communication with Friends and Family: Not on file  . Frequency of Social Gatherings with Friends and Family: Not on file  . Attends Religious Services: Not on file  . Active Member of Clubs or Organizations: Not on file  . Attends Archivist Meetings: Not on file  . Marital Status: Not on file    ECOG Status: 0 - Asymptomatic  Review of Systems  Review of Systems: A 12 point ROS discussed and pertinent positives are indicated in the HPI above.  All other systems are negative.  Physical Exam No direct physical exam was performed (except for noted visual exam findings with Video Visits).   Vital Signs: LMP  (LMP Unknown)   Imaging: MR ABDOMEN WWO CONTRAST  Result Date: 08/26/2020 CLINICAL DATA:  Segment 8 hepatocellular carcinoma. Status post chemoembolization 02/21/2016 and thermal ablation on 03/21/2016. Restaging. EXAM: MRI ABDOMEN WITHOUT AND WITH CONTRAST TECHNIQUE: Multiplanar multisequence MR imaging of the abdomen was performed both before and after the administration of intravenous contrast. CONTRAST:  9.70mL EOVIST GADOXETATE DISODIUM 0.25 MOL/L IV SOLN COMPARISON:  MRI 03/10/2020 FINDINGS: Lower chest: Mild hepatic steatosis. Hepatobiliary: Fine irregular liver surface compatible with cirrhosis. Segment 8 right liver lobe ablation zone measures 3.0 x 2.2 cm, image 45/11. This is unchanged when compared with the previous exam. Mixed pre contrast T1 signal intensity is identified. No internal enhancement identified on the postcontrast images. No new enhancing liver lesions identified Previous cholecystectomy. No biliary ductal dilatation identified. No choledocholithiasis. Pancreas: No mass, inflammatory changes, or other  parenchymal abnormality identified. Spleen:  Within normal limits in size and appearance. Adrenals/Urinary Tract: Normal adrenal glands. No kidney mass or hydronephrosis identified. Stomach/Bowel: Visualized portions within the abdomen are unremarkable. Vascular/Lymphatic: Normal appearance of the abdominal aorta. No aneurysm. No adenopathy identified. Portal vein is  patent. Other:  No ascites. Musculoskeletal: No suspicious bone lesions identified. IMPRESSION: 1. No acute findings within the abdomen. 2. Stable ablation zone within the right liver lobe. No signs of recurrent tumor. No new liver lesions identified. 3. Morphologic features of the liver compatible with cirrhosis. Electronically Signed   By: Kerby Moors M.D.   On: 08/26/2020 11:13    Labs:  CBC: Recent Labs    05/22/20 0931  WBC 4.9  HGB 12.3  HCT 38.7  PLT 201.0    COAGS: Recent Labs    05/22/20 0931  INR 1.0    BMP: Recent Labs    05/22/20 0931  NA 142  K 4.1  CL 110  CO2 23  GLUCOSE 105*  BUN 20  CALCIUM 9.3  CREATININE 0.87    LIVER FUNCTION TESTS: Recent Labs    05/22/20 0931  BILITOT 0.3  AST 16  ALT 11  ALKPHOS 94  PROT 7.4  ALBUMIN 4.3    TUMOR MARKERS: No results for input(s): AFPTM, CEA, CA199, CHROMGRNA in the last 8760 hours.  Assessment and Plan:  Doing exceptionally well 4.5 years status post combined Deb TACE and percutaneous thermal ablation of hepatocellular carcinoma.  MR surveillance imaging detects no evidence of recurrent or new disease.  She remains in remission.  We will continue active surveillance every 6 months.  1.)  Repeat MRI with liver labs including AFP and clinic visit in 6 months.   Electronically Signed: [name] 08/30/2020, 10:03 AM   I spent a total of  10 Minutes in remote  clinical consultation, greater than 50% of which was counseling/coordinating care for hepatocellular carcinoma.    Visit type: Audio only (telephone). Audio (no video) only  due to patient request. Alternative for in-person consultation at [facility], [address], [place], [place]. This visit type was conducted due to national recommendations for restrictions regarding the COVID-19 Pandemic (e.g. social distancing).  This format is felt to be most appropriate for this patient at this time.  All issues noted in this document were discussed and addressed.

## 2020-09-03 ENCOUNTER — Other Ambulatory Visit: Payer: Self-pay | Admitting: Internal Medicine

## 2020-09-19 ENCOUNTER — Other Ambulatory Visit: Payer: Self-pay

## 2020-09-19 ENCOUNTER — Ambulatory Visit (INDEPENDENT_AMBULATORY_CARE_PROVIDER_SITE_OTHER): Payer: Medicare Other

## 2020-09-19 DIAGNOSIS — Z23 Encounter for immunization: Secondary | ICD-10-CM | POA: Diagnosis not present

## 2020-09-27 ENCOUNTER — Other Ambulatory Visit: Payer: Self-pay

## 2020-09-27 ENCOUNTER — Ambulatory Visit
Admission: EM | Admit: 2020-09-27 | Discharge: 2020-09-27 | Disposition: A | Discharge status: Home / Self Care | Payer: Medicare Other | Attending: Emergency Medicine | Admitting: Emergency Medicine

## 2020-09-27 DIAGNOSIS — L03114 Cellulitis of left upper limb: Secondary | ICD-10-CM

## 2020-09-27 MED ORDER — DOXYCYCLINE HYCLATE 100 MG PO CAPS: 100.0000 mg | ORAL_CAPSULE | Freq: Two times a day (BID) | ORAL | 0 refills | Status: AC

## 2020-09-27 NOTE — ED Triage Notes (Signed)
Patient states she was on a persons sofa and felt a bite. She did not see the insect. PT states the swelling and redness has continued to worsen since the incident which occurred a week ago. PT is aox4 and oriented.

## 2020-09-27 NOTE — Discharge Instructions (Addendum)
Keep area(s) clean and dry. °Apply hot compress / towel for 5-10 minutes 3-5 times daily. °Take antibiotic as prescribed with food - important to complete course. °Return for worsening pain, redness, swelling, discharge, fever. ° °Helpful prevention tips: °Keep nails short to avoid secondary skin infections. °Use new, clean razors when shaving. °Avoid antiperspirants - look for deodorants without aluminum. °Avoid wearing underwire bras as this can irritate the area further.  °

## 2020-09-27 NOTE — ED Provider Notes (Signed)
[facility]    CSN: [id number] Arrival date & time: 09/27/20  1433      History   Chief Complaint Chief Complaint  Patient presents with  . Insect Bite    since last week    HPI Samantha [name] is a 69 y.o. female   Presenting for painful swelling around area of bug bite that occurred 1 week ago.  States this occurred to left upper arm.  No fever, malaise, arthralgias, myalgias, active discharge.  Has been keeping clean with peroxide.  Past Medical History:  Diagnosis Date  . Allergy    seasonal  . Cataract    bilateral - MD just watching  . Cirrhosis ([place]) 2018  . Diverticulosis   . Edema   . Elevated glucose    no longer an issue 03-18-16  . Fatty liver 2017  . GERD (gastroesophageal reflux disease)   . H/O seasonal allergies   . Hepatitis C    hepatitis c completed  tx march 2017  . Hyperlipidemia 2011  . Hypertension   . LBP (low back pain)   . Liver cancer ([place]) 2017   IN REMISSION. WHEN IDAGNONED SIZE OF GRAPEFRUIT   . Morbid obesity ([place])   . Osteoarthritis    B knees  . Right middle lobe pulmonary nodule    resolved 2018  . Uterine fibroid   . Wears glasses     Patient Active Problem List   Diagnosis Date Noted  . Polyp of ascending colon   . Polyp of cecum   . Polyp of descending colon   . Polyp of sigmoid colon   . Diverticulosis of colon without hemorrhage   . Internal hemorrhoids   . MRSA cellulitis   . Abscess of skin of abdomen 08/09/2019  . Cellulitis of abdominal wall 08/05/2019  . Irregular Z line of esophagus   . Vagina bleeding 05/14/2018  . Gastritis   . Edema 04/14/2016  . Liver cirrhosis secondary to NASH ([place]) 03/03/2016  . Hepatocellular carcinoma ([place]) 12/20/2015  . Chronic hepatitis C without hepatic coma ([place]) 12/05/2015  . Liver lesion, right lobe 12/05/2015  . Elevated LFTs 08/15/2015  . Screening for colon cancer   . Personal history of colonic polyps   . Benign neoplasm of transverse colon   .  Cystitis 08/04/2013  . Well adult exam 11/30/2012  . Fatty liver 03/23/2012  . Special screening for malignant neoplasms, colon 12/03/2011  . Benign neoplasm of colon 12/03/2011  . Diverticulosis of colon (without mention of hemorrhage) 12/03/2011  . 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 Bronchitis 11/25/2011  . Neck pain, chronic 08/22/2011  . COUGH 10/23/2010  . NONSPEC ELEVATION OF LEVELS OF TRANSAMINASE/LDH 09/23/2010  . WART, VIRAL 09/20/2010  . Hyperlipidemia 2011  . Anemia 2011  . OSTEOARTHRITIS 08/07/2009  . GERD 11/14/2008  . Low back pain 11/14/2008  . SINUSITIS, ACUTE 09/28/2008  . NEOPLASM OF UNCERTAIN BEHAVIOR OF SKIN 07/14/2008  . OBESITY, MORBID 05/04/2008  . KNEE PAIN 05/04/2008  . Hyperglycemia 05/04/2008  . VARICOSE VEINS, LOWER EXTREMITIES 04/04/2008  . Essential hypertension 03/31/2008  . MUSCLE CRAMPS 03/31/2008  . Urinary frequency 03/31/2008    Past Surgical History:  Procedure Laterality Date  . BIOPSY  07/04/2019   Procedure: BIOPSY;  Surgeon: Jerene Bears, MD;  Location: [facility];  Service: Gastroenterology;;  . BIOPSY  07/11/2020   Procedure: BIOPSY;  Surgeon: Lavena Bullion, DO;  Location: [facility];  Service: Gastroenterology;;  . CHOLECYSTECTOMY    .  COLONOSCOPY N/A 01/09/2015   Procedure: COLONOSCOPY;  Surgeon: Jerene Bears, MD;  Location: [facility];  Service: Gastroenterology;  Laterality: N/A;  . COLONOSCOPY WITH PROPOFOL N/A 06/06/2020   Procedure: COLONOSCOPY WITH PROPOFOL;  Surgeon: Lavena Bullion, DO;  Location: [facility];  Service: Gastroenterology;  Laterality: N/A;  . COLONOSCOPY WITH PROPOFOL N/A 07/11/2020   Procedure: COLONOSCOPY WITH PROPOFOL;  Surgeon: Lavena Bullion, DO;  Location: [facility];  Service: Gastroenterology;  Laterality: N/A;  . ESOPHAGOGASTRODUODENOSCOPY (EGD) WITH PROPOFOL N/A 06/10/2016   Procedure: ESOPHAGOGASTRODUODENOSCOPY (EGD) WITH PROPOFOL;  Surgeon: Jerene Bears, MD;  [facility];   Service: Gastroenterology;  Laterality: N/A;  . ESOPHAGOGASTRODUODENOSCOPY (EGD) WITH PROPOFOL N/A 07/04/2019   Procedure: ESOPHAGOGASTRODUODENOSCOPY (EGD) WITH PROPOFOL;  Surgeon: Jerene Bears, MD;  [facility];  Service: Gastroenterology;  Laterality: N/A;  . HYSTEROSCOPY WITH D & C N/A 10/15/2018   Procedure: DILATATION AND CURETTAGE /HYSTEROSCOPY;  Surgeon: Aloha Gell, MD;  Location: [facility];  Service: Gynecology;  Laterality: N/A;  . IR GENERIC HISTORICAL  07/03/2016   IR RADIOLOGIST EVAL & MGMT 07/03/2016 [name], MD [facility]  . IR GENERIC HISTORICAL  11/18/2016   IR RADIOLOGIST EVAL & MGMT 11/18/2016 [name], MD [facility]  . IR RADIOLOGIST EVAL & MGMT  04/09/2017  . IR RADIOLOGIST EVAL & MGMT  11/05/2017  . IR RADIOLOGIST EVAL & MGMT  03/13/2020  . IR RADIOLOGIST EVAL & MGMT  08/30/2020  . JOINT REPLACEMENT     BILATERAL KNEES  . left shoulder arthroplasty    . POLYPECTOMY  06/06/2020   Procedure: POLYPECTOMY;  Surgeon: Lavena Bullion, DO;  Location: [facility];  Service: Gastroenterology;;  . POLYPECTOMY  07/11/2020   Procedure: POLYPECTOMY;  Surgeon: Lavena Bullion, DO;  Location: [facility];  Service: Gastroenterology;;  . right knee replacement  2007  . total knee replacement L  03-13-08  . TUBAL LIGATION      OB History   No obstetric history on file.      Home Medications    Prior to Admission medications   Medication Sig Start Date End Date Taking? Authorizing Provider  aspirin EC 81 MG tablet Take 81 mg by mouth daily.   Yes [provider]  BLACK COHOSH EXTRACT PO Take 1 tablet by mouth daily.   Yes [provider]  Cholecalciferol (EQL VITAMIN D3) 1000 UNITS tablet Take 1,000 Units by mouth daily.    Yes [provider]  EQ LORATADINE 10 MG tablet Take 1 tablet by mouth once daily 08/14/20  Yes Plotnikov, Evie Lacks, MD  furosemide (LASIX) 20 MG tablet Take 1-2 tablets (20-40 mg total) by  mouth daily. Patient taking differently: Take 40 mg by mouth daily.  05/22/20  Yes Plotnikov, Evie Lacks, MD  glucosamine-chondroitin 500-400 MG tablet Take 1 tablet by mouth daily.   Yes [provider]  losartan (COZAAR) 100 MG tablet Take 1 tablet by mouth once daily 09/04/20  Yes Plotnikov, Evie Lacks, MD  meloxicam (MOBIC) 7.5 MG tablet TAKE 1 TO 2 TABLETS BY MOUTH ONCE DAILY AS NEEDED FOR PAIN Patient taking differently: Take 7.5 mg by mouth daily. TAKE 1 TABLET BY MOUTH ONCE DAILY FOR PAIN 05/22/20  Yes Plotnikov, Evie Lacks, MD  Multiple Vitamin (MULTIVITAMIN WITH MINERALS) TABS tablet Take 1 tablet by mouth daily.    Yes [provider]  pantoprazole (PROTONIX) 40 MG tablet Take 1 tablet by mouth once daily 07/30/20  Yes Plotnikov,  Evie Lacks, MD  potassium chloride SA (KLOR-CON) 20 MEQ tablet Take 1 tablet (20 mEq total) by mouth daily. 05/22/20  Yes Plotnikov, Evie Lacks, MD  vitamin C (ASCORBIC ACID) 500 MG tablet Take 500 mg by mouth daily.   Yes [provider]  doxycycline (VIBRAMYCIN) 100 MG capsule Take 1 capsule (100 mg total) by mouth 2 (two) times daily for 7 days. 09/27/20 10/04/20  Hall-Potvin, [place], PA-C  promethazine (PHENERGAN) 12.5 MG tablet Take 1 tablet (12.5 mg total) by mouth every 6 (six) hours as needed for nausea or vomiting. 08/09/19 09/27/20  Janith Lima, MD    Family History Family History  Problem Relation Age of Onset  . Hypertension Mother   . Ovarian cancer Mother   . Hypertension Father   . Cancer Father 17       prostate cancer   . Diabetes Father   . Diabetes Other   . Hypertension Other   . Colon cancer Neg Hx   . Esophageal cancer Neg Hx   . Rectal cancer Neg Hx   . Stomach cancer Neg Hx   . Breast cancer Neg Hx     Social History Social History   Tobacco Use  . Smoking status: Former Smoker    Packs/day: 0.25    Years: 20.00    Pack years: 5.00    Quit date: 12/30/2007    Years since quitting: 12.7  . Smokeless  tobacco: Never Used  Vaping Use  . Vaping Use: Never used  Substance Use Topics  . 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 Drug use: No     Allergies   Amlodipine, Benazepril hcl, and Codeine   Review of Systems As per HPI   Physical Exam Triage Vital Signs ED Triage Vitals  Enc Vitals Group     BP      Pulse      Resp      Temp      Temp src      SpO2      Weight      Height      Head Circumference      Peak Flow      Pain Score      Pain Loc      Pain Edu?      Excl. in [place]?    No data found.  Updated Vital Signs Pulse 91   Temp 98.2 F (36.8 C) (Oral)   Resp 19   LMP  (LMP Unknown)   SpO2 96%   Visual Acuity Right Eye Distance:   Left Eye Distance:   Bilateral Distance:    Right Eye Near:   Left Eye Near:    Bilateral Near:     Physical Exam Constitutional:      General: She is not in acute distress. HENT:     Head: Normocephalic and atraumatic.  Eyes:     General: No scleral icterus.    Pupils: Pupils are equal, round, and reactive to light.  Cardiovascular:     Rate and Rhythm: Normal rate.  Pulmonary:     Effort: Pulmonary effort is normal.  Skin:    Coloration: Skin is not jaundiced or pale.     Comments: 2 cm area of erythema with induration.  No active discharge, fluctuation.  Neurological:     Mental Status: She is alert and oriented to person,  place, and time.      UC Treatments / Results  Labs (all labs ordered are listed, but only abnormal results are displayed) Labs Reviewed - No data to display  EKG   Radiology No results found.  Procedures Procedures (including critical care time)  Medications Ordered in UC Medications - No data to display  Initial Impression / Assessment and Plan / UC Course  I have reviewed the triage vital signs and the nursing notes.  Pertinent labs & imaging results that were available during my care of the  patient were reviewed by me and considered in my medical decision making (see chart for details).     Patient afebrile, nontoxic.  Unknown bug bite, though H&P concerning for secondary cellulitis.  Will start antibiotic as outlined below.  Return precautions discussed, pt verbalized understanding and is agreeable to plan. Final Clinical Impressions(s) / UC Diagnoses   Final diagnoses:  Cellulitis of left upper arm     Discharge Instructions     Keep area(s) clean and dry. Apply hot compress / towel for 5-10 minutes 3-5 times daily. Take antibiotic as prescribed with food - important to complete course. Return for worsening pain, redness, swelling, discharge, fever.  Helpful prevention tips: Keep nails short to avoid secondary skin infections. Use new, clean razors when shaving. Avoid antiperspirants - look for deodorants without aluminum. Avoid wearing underwire bras as this can irritate the area further.     ED Prescriptions    Medication Sig Dispense Auth. Provider   doxycycline (VIBRAMYCIN) 100 MG capsule Take 1 capsule (100 mg total) by mouth 2 (two) times daily for 7 days. 14 capsule Hall-Potvin, [place], PA-C     PDMP not reviewed this encounter.   Hall-Potvin, [place], [place] 09/27/20 1503

## 2020-11-12 ENCOUNTER — Telehealth: Payer: Self-pay | Admitting: Internal Medicine

## 2020-11-12 NOTE — Telephone Encounter (Signed)
LVM for pt to rtn my call to schedule AWV with [facility].  

## 2020-11-27 ENCOUNTER — Encounter: Payer: Self-pay | Admitting: Internal Medicine

## 2020-11-27 ENCOUNTER — Ambulatory Visit (INDEPENDENT_AMBULATORY_CARE_PROVIDER_SITE_OTHER): Payer: Medicare Other

## 2020-11-27 ENCOUNTER — Ambulatory Visit (INDEPENDENT_AMBULATORY_CARE_PROVIDER_SITE_OTHER): Payer: Medicare Other | Admitting: Internal Medicine

## 2020-11-27 ENCOUNTER — Other Ambulatory Visit: Payer: Self-pay

## 2020-11-27 VITALS — BP 138/60 | HR 61 | Temp 97.8°F | Ht 61.0 in | Wt 302.4 lb

## 2020-11-27 VITALS — BP 138/60 | HR 69 | Temp 97.8°F | Ht 61.0 in | Wt 302.6 lb

## 2020-11-27 DIAGNOSIS — R739 Hyperglycemia, unspecified: Secondary | ICD-10-CM | POA: Diagnosis not present

## 2020-11-27 DIAGNOSIS — I10 Essential (primary) hypertension: Secondary | ICD-10-CM | POA: Diagnosis not present

## 2020-11-27 DIAGNOSIS — R600 Localized edema: Secondary | ICD-10-CM | POA: Diagnosis not present

## 2020-11-27 DIAGNOSIS — Z0000 Encounter for general adult medical examination without abnormal findings: Secondary | ICD-10-CM

## 2020-11-27 DIAGNOSIS — M5440 Lumbago with sciatica, unspecified side: Secondary | ICD-10-CM

## 2020-11-27 DIAGNOSIS — G8929 Other chronic pain: Secondary | ICD-10-CM

## 2020-11-27 LAB — COMPREHENSIVE METABOLIC PANEL
ALT: 9 U/L (ref 0–35)
AST: 16 U/L (ref 0–37)
Albumin: 4.1 g/dL (ref 3.5–5.2)
Alkaline Phosphatase: 82 U/L (ref 39–117)
BUN: 13 mg/dL (ref 6–23)
CO2: 29 mEq/L (ref 19–32)
Calcium: 9.4 mg/dL (ref 8.4–10.5)
Chloride: 104 mEq/L (ref 96–112)
Creatinine, Ser: 0.93 mg/dL (ref 0.40–1.20)
GFR: 61.99 mL/min (ref >60.00)
Glucose, Bld: 101 mg/dL — ABNORMAL HIGH (ref 70–99)
Potassium: 4.2 mEq/L (ref 3.5–5.1)
Sodium: 140 mEq/L (ref 135–145)
Total Bilirubin: 0.5 mg/dL (ref 0.2–1.2)
Total Protein: 7.5 g/dL (ref 6.0–8.3)

## 2020-11-27 LAB — HEMOGLOBIN A1C: Hgb A1c MFr Bld: 6 % (ref 4.6–6.5)

## 2020-11-27 MED ORDER — POTASSIUM CHLORIDE CRYS ER 20 MEQ PO TBCR: 20.0000 meq | EXTENDED_RELEASE_TABLET | Freq: Every day | ORAL | 3 refills | Status: DC

## 2020-11-27 MED ORDER — PANTOPRAZOLE SODIUM 40 MG PO TBEC: 40.0000 mg | DELAYED_RELEASE_TABLET | Freq: Every day | ORAL | 3 refills | Status: DC

## 2020-11-27 MED ORDER — FUROSEMIDE 20 MG PO TABS: 20.0000 mg | ORAL_TABLET | Freq: Every day | ORAL | 3 refills | Status: DC

## 2020-11-27 MED ORDER — LOSARTAN POTASSIUM 100 MG PO TABS: 100.0000 mg | ORAL_TABLET | Freq: Every day | ORAL | 3 refills | Status: DC

## 2020-11-27 MED ORDER — MELOXICAM 7.5 MG PO TABS: ORAL_TABLET | ORAL | 3 refills | Status: DC

## 2020-11-27 NOTE — Assessment & Plan Note (Signed)
Lasix, Wt loss

## 2020-11-27 NOTE — Patient Instructions (Signed)
Samantha [name] , Thank you for taking time to come for your Medicare Wellness Visit. I appreciate your ongoing commitment to your health goals. Please review the following plan we discussed and let me know if I can assist you in the future.   Screening recommendations/referrals: Colonoscopy: 07/11/2020; due every 5 years Mammogram: 06/15/2020 Bone Density: never done Recommended yearly ophthalmology/optometry visit for glaucoma screening and checkup Recommended yearly dental visit for hygiene and checkup  Vaccinations: Influenza vaccine: 09/19/2020 Pneumococcal vaccine: up to date Tdap vaccine: 11/30/2012; due every 10 years Shingles vaccine: never done   Covid-19: up to date  Advanced directives: Please bring a copy of your health care power of attorney and living will to the office at your convenience.  Conditions/risks identified: Yes; Reviewed health maintenance screenings with patient today and relevant education, vaccines, and/or referrals were provided. Please continue to do your personal lifestyle choices by: daily care of teeth and gums, regular physical activity (goal should be 5 days a week for 30 minutes), eat a healthy diet, avoid tobacco and drug use, limiting any alcohol intake, taking a low-dose aspirin (if not allergic or have been advised by your provider otherwise) and taking vitamins and minerals as recommended by your provider. Continue doing brain stimulating activities (puzzles, reading, adult coloring books, staying active) to keep memory sharp. Continue to eat heart healthy diet (full of fruits, vegetables, whole grains, lean protein, water--limit salt, fat, and sugar intake) and increase physical activity as tolerated.  Next appointment: Please schedule your next Medicare Wellness Visit with your Nurse Health Advisor in 1 year by calling [phone].   Preventive Care 21 Years and Older, Female Preventive care refers to lifestyle choices and visits with your health care  provider that can promote health and wellness. What does preventive care include?  A yearly physical exam. This is also called an annual well check.  Dental exams once or twice a year.  Routine eye exams. Ask your health care provider how often you should have your eyes checked.  Personal lifestyle choices, including:  Daily care of your teeth and gums.  Regular physical activity.  Eating a healthy diet.  Avoiding tobacco and drug use.  Limiting alcohol use.  Practicing safe sex.  Taking low-dose aspirin every day.  Taking vitamin and mineral supplements as recommended by your health care provider. What happens during an annual well check? The services and screenings done by your health care provider during your annual well check will depend on your age, overall health, lifestyle risk factors, and family history of disease. Counseling  Your health care provider may ask you questions about your:  Alcohol use.  Tobacco use.  Drug use.  Emotional well-being.  Home and relationship well-being.  Sexual activity.  Eating habits.  History of falls.  Memory and ability to understand (cognition).  Work and work Statistician.  Reproductive health. Screening  You may have the following tests or measurements:  Height, weight, and BMI.  Blood pressure.  Lipid and cholesterol levels. These may be checked every 5 years, or more frequently if you are over 38 years old.  Skin check.  Lung cancer screening. You may have this screening every year starting at age 61 if you have a 30-pack-year history of smoking and currently smoke or have quit within the past 15 years.  Fecal occult blood test (FOBT) of the stool. You may have this test every year starting at age 80.  Flexible sigmoidoscopy or colonoscopy. You may have a sigmoidoscopy every  5 years or a colonoscopy every 10 years starting at age 32.  Hepatitis C blood test.  Hepatitis B blood test.  Sexually  transmitted disease (STD) testing.  Diabetes screening. This is done by checking your blood sugar (glucose) after you have not eaten for a while (fasting). You may have this done every 1-3 years.  Bone density scan. This is done to screen for osteoporosis. You may have this done starting at age 45.  Mammogram. This may be done every 1-2 years. Talk to your health care provider about how often you should have regular mammograms. Talk with your health care provider about your test results, treatment options, and if necessary, the need for more tests. Vaccines  Your health care provider may recommend certain vaccines, such as:  Influenza vaccine. This is recommended every year.  Tetanus, diphtheria, and acellular pertussis (Tdap, Td) vaccine. You may need a Td booster every 10 years.  Zoster vaccine. You may need this after age 15.  Pneumococcal 13-valent conjugate (PCV13) vaccine. One dose is recommended after age 38.  Pneumococcal polysaccharide (PPSV23) vaccine. One dose is recommended after age 21. Talk to your health care provider about which screenings and vaccines you need and how often you need them. This information is not intended to replace advice given to you by your health care provider. Make sure you discuss any questions you have with your health care provider. Document Released: 01/11/2016 Document Revised: 09/03/2016 Document Reviewed: 10/16/2015 Elsevier Interactive Patient Education  2017 [employer] Prevention in the Home Falls can cause injuries. They can happen to people of all ages. There are many things you can do to make your home safe and to help prevent falls. What can I do on the outside of my home?  Regularly fix the edges of walkways and driveways and fix any cracks.  Remove anything that might make you trip as you walk through a door, such as a raised step or threshold.  Trim any bushes or trees on the path to your home.  Use bright outdoor  lighting.  Clear any walking paths of anything that might make someone trip, such as rocks or tools.  Regularly check to see if handrails are loose or broken. Make sure that both sides of any steps have handrails.  Any raised decks and porches should have guardrails on the edges.  Have any leaves, snow, or ice cleared regularly.  Use sand or salt on walking paths during winter.  Clean up any spills in your garage right away. This includes oil or grease spills. What can I do in the bathroom?  Use night lights.  Install grab bars by the toilet and in the tub and shower. Do not use towel bars as grab bars.  Use non-skid mats or decals in the tub or shower.  If you need to sit down in the shower, use a plastic, non-slip stool.  Keep the floor dry. Clean up any water that spills on the floor as soon as it happens.  Remove soap buildup in the tub or shower regularly.  Attach bath mats securely with double-sided non-slip rug tape.  Do not have throw rugs and other things on the floor that can make you trip. What can I do in the bedroom?  Use night lights.  Make sure that you have a light by your bed that is easy to reach.  Do not use any sheets or blankets that are too big for your bed. They should not  hang down onto the floor.  Have a firm chair that has side arms. You can use this for support while you get dressed.  Do not have throw rugs and other things on the floor that can make you trip. What can I do in the kitchen?  Clean up any spills right away.  Avoid walking on wet floors.  Keep items that you use a lot in easy-to-reach places.  If you need to reach something above you, use a strong step stool that has a grab bar.  Keep electrical cords out of the way.  Do not use floor polish or wax that makes floors slippery. If you must use wax, use non-skid floor wax.  Do not have throw rugs and other things on the floor that can make you trip. What can I do with my  stairs?  Do not leave any items on the stairs.  Make sure that there are handrails on both sides of the stairs and use them. Fix handrails that are broken or loose. Make sure that handrails are as long as the stairways.  Check any carpeting to make sure that it is firmly attached to the stairs. Fix any carpet that is loose or worn.  Avoid having throw rugs at the top or bottom of the stairs. If you do have throw rugs, attach them to the floor with carpet tape.  Make sure that you have a light switch at the top of the stairs and the bottom of the stairs. If you do not have them, ask someone to add them for you. What else can I do to help prevent falls?  Wear shoes that:  Do not have high heels.  Have rubber bottoms.  Are comfortable and fit you well.  Are closed at the toe. Do not wear sandals.  If you use a stepladder:  Make sure that it is fully opened. Do not climb a closed stepladder.  Make sure that both sides of the stepladder are locked into place.  Ask someone to hold it for you, if possible.  Clearly mark and make sure that you can see:  Any grab bars or handrails.  First and last steps.  Where the edge of each step is.  Use tools that help you move around (mobility aids) if they are needed. These include:  Canes.  Walkers.  Scooters.  Crutches.  Turn on the lights when you go into a dark area. Replace any light bulbs as soon as they burn out.  Set up your furniture so you have a clear path. Avoid moving your furniture around.  If any of your floors are uneven, fix them.  If there are any pets around you, be aware of where they are.  Review your medicines with your doctor. Some medicines can make you feel dizzy. This can increase your chance of falling. Ask your doctor what other things that you can do to help prevent falls. This information is not intended to replace advice given to you by your health care provider. Make sure you discuss any  questions you have with your health care provider. Document Released: 10/11/2009 Document Revised: 05/22/2016 Document Reviewed: 01/19/2015 Elsevier Interactive Patient Education  2017 [employer].

## 2020-11-27 NOTE — Assessment & Plan Note (Signed)
Losartan, Norvasc, Lasix

## 2020-11-27 NOTE — Progress Notes (Addendum)
Subjective:   Samantha [name] is a 69 y.o. female who presents for Medicare Annual (Subsequent) preventive examination.  Review of Systems    No ROS. Medicare Wellness Visit. Additional risk factors are reflected in social history. Cardiac Risk Factors include: advanced age (>25men, >92 women);dyslipidemia;hypertension;family history of premature cardiovascular disease;obesity (BMI >30kg/m2);smoking/ tobacco exposure Sleep Patterns: No sleep issues, feels rested on waking and sleeps 8 hours nightly. Home Safety/Smoke Alarms: Feels safe in home; uses home alarm. Smoke alarms in place. Living environment: 1-story home; Lives with roommate; no needs for DME; good support system. Seat Belt Safety/Bike Helmet: Wears seat belt.    Objective:    Today's Vitals   11/27/20 0828  BP: 138/60  Pulse: 61  Temp: 97.8 F (36.6 C)  SpO2: (!) 88%  Weight: (!) 302 lb 6.4 oz (137.2 kg)  Height: 5\' 1"  (1.549 m)  PainSc: 0-No pain   Body mass index is 57.14 kg/m.  Advanced Directives 11/27/2020 07/11/2020 06/06/2020 08/16/2019 08/16/2019 07/04/2019 10/04/2018  Does Patient Have a Medical Advance Directive? Yes Yes Yes No No Yes Yes  Type of Paramedic of [place];Living will Living will [place];Living will - - [place];Living will -  Does patient want to make changes to medical advance directive? No - Patient declined - - - - - No - Patient declined  Copy of [place] in Chart? No - copy requested - No - copy requested - - No - copy requested -  Would patient like information on creating a medical advance directive? - - - No - Patient declined - - -    Current Medications (verified) Outpatient Encounter Medications as of 11/27/2020  Medication Sig   aspirin EC 81 MG tablet Take 81 mg by mouth daily.   BLACK COHOSH EXTRACT PO Take 1 tablet by mouth daily.   Cholecalciferol (EQL VITAMIN D3) 1000 UNITS tablet Take  1,000 Units by mouth daily.    EQ LORATADINE 10 MG tablet Take 1 tablet by mouth once daily   furosemide (LASIX) 20 MG tablet Take 1-2 tablets (20-40 mg total) by mouth daily. (Patient taking differently: Take 40 mg by mouth daily. )   glucosamine-chondroitin 500-400 MG tablet Take 1 tablet by mouth daily.   losartan (COZAAR) 100 MG tablet Take 1 tablet by mouth once daily   meloxicam (MOBIC) 7.5 MG tablet TAKE 1 TO 2 TABLETS BY MOUTH ONCE DAILY AS NEEDED FOR PAIN (Patient taking differently: Take 7.5 mg by mouth daily. TAKE 1 TABLET BY MOUTH ONCE DAILY FOR PAIN)   Multiple Vitamin (MULTIVITAMIN WITH MINERALS) TABS tablet Take 1 tablet by mouth daily.    pantoprazole (PROTONIX) 40 MG tablet Take 1 tablet by mouth once daily   potassium chloride SA (KLOR-CON) 20 MEQ tablet Take 1 tablet (20 mEq total) by mouth daily.   vitamin C (ASCORBIC ACID) 500 MG tablet Take 500 mg by mouth daily.   [DISCONTINUED] promethazine (PHENERGAN) 12.5 MG tablet Take 1 tablet (12.5 mg total) by mouth every 6 (six) hours as needed for nausea or vomiting.   No facility-administered encounter medications on file as of 11/27/2020.    Allergies (verified) Amlodipine, Benazepril hcl, and Codeine   History: Past Medical History:  Diagnosis Date   Allergy    seasonal   Cataract    bilateral - MD just watching   Cirrhosis ([place]) 2018   Diverticulosis    Edema    Elevated glucose  no longer an issue 03-18-16   Fatty liver 2017   GERD (gastroesophageal reflux disease)    H/O seasonal allergies    Hepatitis C    hepatitis c completed  tx march 2017   Hyperlipidemia 2011   Hypertension    LBP (low back pain)    Liver cancer ([place]) 2017   IN REMISSION. WHEN IDAGNONED SIZE OF GRAPEFRUIT    Morbid obesity ([place])    Osteoarthritis    B knees   Right middle lobe pulmonary nodule    resolved 2018   Uterine fibroid    Wears glasses    Past Surgical History:  Procedure Laterality Date   BIOPSY  07/04/2019    Procedure: BIOPSY;  Surgeon: Jerene Bears, MD;  Location: [facility];  Service: Gastroenterology;;   BIOPSY  07/11/2020   Procedure: BIOPSY;  Surgeon: Lavena Bullion, DO;  Location: [facility];  Service: Gastroenterology;;   CHOLECYSTECTOMY     COLONOSCOPY N/A 01/09/2015   Procedure: COLONOSCOPY;  Surgeon: Jerene Bears, MD;  Location: [facility];  Service: Gastroenterology;  Laterality: N/A;   COLONOSCOPY WITH PROPOFOL N/A 06/06/2020   Procedure: COLONOSCOPY WITH PROPOFOL;  Surgeon: Lavena Bullion, DO;  [facility]: [facility];  Service: Gastroenterology;  Laterality: N/A;   COLONOSCOPY WITH PROPOFOL N/A 07/11/2020   Procedure: COLONOSCOPY WITH PROPOFOL;  Surgeon: Lavena Bullion, DO;  [facility];  Service: Gastroenterology;  Laterality: N/A;   ESOPHAGOGASTRODUODENOSCOPY (EGD) WITH PROPOFOL N/A 06/10/2016   Procedure: ESOPHAGOGASTRODUODENOSCOPY (EGD) WITH PROPOFOL;  Surgeon: Jerene Bears, MD;  [facility];  Service: Gastroenterology;  Laterality: N/A;   ESOPHAGOGASTRODUODENOSCOPY (EGD) WITH PROPOFOL N/A 07/04/2019   Procedure: ESOPHAGOGASTRODUODENOSCOPY (EGD) WITH PROPOFOL;  Surgeon: Jerene Bears, MD;  [facility];  Service: Gastroenterology;  Laterality: N/A;   HYSTEROSCOPY WITH D & C N/A 10/15/2018   Procedure: DILATATION AND CURETTAGE /HYSTEROSCOPY;  Surgeon: Aloha Gell, MD;  Location: [facility];  Service: Gynecology;  Laterality: N/A;   IR GENERIC HISTORICAL  07/03/2016   IR RADIOLOGIST EVAL & MGMT 07/03/2016 [name], MD [facility]   IR GENERIC HISTORICAL  11/18/2016   IR RADIOLOGIST EVAL & MGMT 11/18/2016 [name], MD [facility]   IR RADIOLOGIST EVAL & MGMT  04/09/2017   IR RADIOLOGIST EVAL & MGMT  11/05/2017   IR RADIOLOGIST EVAL & MGMT  03/13/2020   IR RADIOLOGIST EVAL & MGMT  08/30/2020   JOINT REPLACEMENT     BILATERAL KNEES   left shoulder arthroplasty     POLYPECTOMY  06/06/2020   Procedure: POLYPECTOMY;   Surgeon: Lavena Bullion, DO;  [facility];  Service: Gastroenterology;;   POLYPECTOMY  07/11/2020   Procedure: POLYPECTOMY;  Surgeon: Lavena Bullion, DO;  Location: [facility];  Service: Gastroenterology;;   right knee replacement  2007   total knee replacement L  03-13-08   TUBAL LIGATION     Family History  Problem Relation Age of Onset   Hypertension Mother    Ovarian cancer Mother    Hypertension Father    Cancer Father 10       prostate cancer    Diabetes Father    Diabetes Other    Hypertension Other    Colon cancer Neg Hx    Esophageal cancer Neg Hx    Rectal cancer Neg Hx    Stomach cancer Neg Hx    Breast cancer Neg Hx    Social History   Socioeconomic History  Marital status: Single    Spouse name: Not on file   Number of children: 4   Years of education: Not on file   Highest education level: Not on file  Occupational History   Not on file  Tobacco Use   Smoking status: Former Smoker    Packs/day: 0.25    Years: 20.00    Pack years: 5.00    Quit date: 12/30/2007    Years since quitting: 12.9   Smokeless tobacco: Never Used  Vaping Use   Vaping Use: Never used  Substance and Sexual Activity   Alcohol use: No    Comment: 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, quit drinking alcohol in 10/2015    Drug use: No   Sexual activity: Not Currently    Birth control/protection: Post-menopausal  Other Topics Concern   Not on file  Social History Narrative   Regular exercise: walk a few times a week   Caffeine use: none   Single, raising her young great granddaughter and watches grandchildren after school   Has a room mate to help prn   Retired from nursing tech--worked at [employer] Strain: Low Risk    Difficulty of Paying Living Expenses: Not hard at all  Food Insecurity: No Food Insecurity   Worried About Charity fundraiser in the Last Year:  Never true   Arboriculturist in the Last Year: Never true  Transportation Needs: No Transportation Needs   Lack of Transportation (Medical): No   Lack of Transportation (Non-Medical): No  Physical Activity: Sufficiently Active   Days of Exercise per Week: 5 days   Minutes of Exercise per Session: 30 min  Stress: No Stress Concern Present   Feeling of Stress : Not at all  Social Connections: Socially Isolated   Frequency of Communication with Friends and Family: More than three times a week   Frequency of Social Gatherings with Friends and Family: More than three times a week   Attends Religious Services: Never   Marine scientist or Organizations: No   Attends Music therapist: Never   Marital Status: Divorced    Tobacco Counseling Counseling given: Not Answered   Clinical Intake:  Pre-visit preparation completed: Yes  Pain : No/denies pain Pain Score: 0-No pain     BMI - recorded: 57.14 Nutritional Status: BMI > 30  Obese Nutritional Risks: None Diabetes: No  How often do you need to have someone help you when you read instructions, pamphlets, or other written materials from your doctor or pharmacy?: 1 - Never What is the last grade level you completed in school?: 11th grade  Diabetic? no  Interpreter Needed?: No  Information entered by :: Coreena Rubalcava N. Nysia Dell, LPN   Activities of Daily Living In your present state of health, do you have any difficulty performing the following activities: 11/27/2020  Hearing? N  Vision? N  Difficulty concentrating or making decisions? N  Walking or climbing stairs? N  Dressing or bathing? N  Doing errands, shopping? N  Preparing Food and eating ? N  Using the Toilet? N  In the past six months, have you accidently leaked urine? N  Do you have problems with loss of bowel control? N  Managing your Medications? N  Managing your Finances? N  Housekeeping or managing your Housekeeping? N  Some recent data  might be hidden  Patient Care Team: Plotnikov, Evie Lacks, MD as PCP - Lowry Bowl, MD as Adriana Mccallum (Orthopedic Surgery) Everett Graff, MD (Obstetrics and Gynecology) Linus Salmons Okey Regal, MD as Consulting Physician (Infectious Diseases) Stark Klein, MD as Consulting Physician (General Surgery) Pyrtle, Lajuan Lines, MD as Consulting Physician (Gastroenterology) Truitt Merle, MD as Consulting Physician (Hematology) Aloha Gell, MD as Consulting Physician (Obstetrics and Gynecology) Jola Schmidt, MD as Consulting Physician (Ophthalmology)  Indicate any recent Medical Services you may have received from other than [facility] providers in the past year (date may be approximate).     Assessment:   This is a routine wellness examination for Samantha [name].  Hearing/Vision screen No exam data present  Dietary issues and exercise activities discussed: Current Exercise Habits: Home exercise routine, Type of exercise: walking, Time (Minutes): 30, Frequency (Times/Week): 5, Weekly Exercise (Minutes/Week): 150, Intensity: Mild  Goals      Travel as much as possible, enjoy family and love life       Depression Screen PHQ 2/9 Scores 11/27/2020 08/09/2019 11/24/2018 11/11/2017 10/30/2017 07/10/2016 03/03/2016  PHQ - 2 Score 0 0 0 0 0 0 0  PHQ- 9 Score - - - 0 - - -    Fall Risk Fall Risk  11/27/2020 08/09/2019 11/24/2018 11/11/2017 10/30/2017  Falls in the past year? 0 0 0 No No  Number falls in past yr: 0 0 - - -  Injury with Fall? 0 0 - - -  Risk for fall due to : No Fall Risks - - - -  Follow up Falls evaluation completed Falls evaluation completed Falls evaluation completed - -    Any stairs in or around the home? No  If so, are there any without handrails? No  Home free of loose throw rugs in walkways, pet beds, electrical cords, etc? Yes  Adequate lighting in your home to reduce risk of falls? Yes   ASSISTIVE DEVICES UTILIZED TO PREVENT FALLS:  Life alert? No  Use of a cane, walker  or w/c? No  Grab bars in the bathroom? No  Shower chair or bench in shower? Yes  Elevated toilet seat or a handicapped toilet? No   TIMED UP AND GO:  Was the test performed? No .  Length of time to ambulate 10 feet: 0 sec.   Gait steady and fast without use of assistive device  Cognitive Function: MMSE - Mini Mental State Exam 11/11/2017  Orientation to time 5  Orientation to Place 5  Registration 3  Attention/ Calculation 5  Recall 2  Language- name 2 objects 2  Language- repeat 1  Language- follow 3 step command 3  Language- read & follow direction 1  Write a sentence 1  Copy design 1  Total score 29     6CIT Screen 11/27/2020  What Year? 0 points  What month? 0 points  What time? 0 points  Count back from 20 0 points  Months in reverse 0 points  Repeat phrase 0 points  Total Score 0    Immunizations Immunization History  Administered Date(s) Administered   Fluad Quad(high Dose 65+) 08/23/2019, 09/19/2020   Influenza Split 11/25/2011   Influenza Whole 09/13/2008, 08/31/2010   Influenza, High Dose Seasonal PF 09/17/2016, 09/08/2017, 09/23/2018   Influenza, Seasonal, Injecte, Preservative Fre 11/30/2012   Influenza,inj,Quad PF,6+ Mos 09/06/2013, 09/05/2014, 09/07/2015   PFIZER SARS-COV-2 Vaccination 03/07/2020, 04/05/2020   PPD Test 07/12/2018, 07/13/2019, 07/16/2020   Pneumococcal Conjugate-13 12/08/2014   Pneumococcal Polysaccharide-23 03/23/2012, 11/11/2017   Tdap 11/30/2012  TDAP status: Up to date Flu Vaccine status: Up to date Pneumococcal vaccine status: Up to date Covid-19 vaccine status: Completed vaccines  Qualifies for Shingles Vaccine? Yes   Zostavax completed No   Shingrix Completed?: No.    Education has been provided regarding the importance of this vaccine. Patient has been advised to call insurance company to determine out of pocket expense if they have not yet received this vaccine. Advised may also receive vaccine at local pharmacy or  Health Dept. Verbalized acceptance and understanding.  Screening Tests Health Maintenance  Topic Date Due   DEXA SCAN  Never done   MAMMOGRAM  06/15/2022   TETANUS/TDAP  11/30/2022   COLONOSCOPY  07/11/2025   INFLUENZA VACCINE  Completed   COVID-19 Vaccine  Completed   Hepatitis C Screening  Completed   PNA vac Low Risk Adult  Completed    Health Maintenance  Health Maintenance Due  Topic Date Due   DEXA SCAN  Never done    Colorectal cancer screening: Completed 07/11/2020. Repeat every 5 years Mammogram status: Completed 06/15/2020. Repeat every year Bone density status: never done  Lung Cancer Screening: (Low Dose CT Chest recommended if Age 41-80 years, 30 pack-year currently smoking OR have quit w/in 15years.) does not qualify.   Lung Cancer Screening Referral: no  Additional Screening:  Hepatitis C Screening: does qualify; Completed yes  Vision Screening: Recommended annual ophthalmology exams for early detection of glaucoma and other disorders of the eye. Is the patient up to date with their annual eye exam?  Yes  Who is the provider or what is the name of the office in which the patient attends annual eye exams? Jola Schmidt, MD. If pt is not established with a provider, would they like to be referred to a provider to establish care? No .   Dental Screening: Recommended annual dental exams for proper oral hygiene  Community Resource Referral / Chronic Care Management: CRR required this visit?  No   CCM required this visit?  No      Plan:     I have personally reviewed and noted the following in the patient's chart:   Medical and social history Use of alcohol, tobacco or illicit drugs  Current medications and supplements Functional ability and status Nutritional status Physical activity Advanced directives List of other physicians Hospitalizations, surgeries, and ER visits in previous 12 months Vitals Screenings to include cognitive, depression, and  falls Referrals and appointments  In addition, I have reviewed and discussed with patient certain preventive protocols, quality metrics, and best practice recommendations. A written personalized care plan for preventive services as well as general preventive health recommendations were provided to patient.     Sheral Flow, LPN   [id number]   Nurse Notes: n/a Medical screening examination/treatment/procedure(s) were performed by non-physician practitioner and as supervising physician I was immediately available for consultation/collaboration.  I agree with above. Lew Dawes, MD

## 2020-11-27 NOTE — Patient Instructions (Signed)
Try A1 milk

## 2020-11-27 NOTE — Progress Notes (Signed)
Subjective:  Patient ID: Samantha [name], female    DOB: Feb 13, 1951  Age: 69 y.o. MRN: [id number]  CC: Follow-up (6 month f/u and med refills 90 days )   HPI Giorgia Wahler presents for HTN, obesity, OA f/u  Outpatient Medications Prior to Visit  Medication Sig Dispense Refill  . aspirin EC 81 MG tablet Take 81 mg by mouth daily.    Marland Kitchen BLACK COHOSH EXTRACT PO Take 1 tablet by mouth daily.    . Cholecalciferol (EQL VITAMIN D3) 1000 UNITS tablet Take 1,000 Units by mouth daily.     . EQ LORATADINE 10 MG tablet Take 1 tablet by mouth once daily 90 tablet 3  . furosemide (LASIX) 20 MG tablet Take 1-2 tablets (20-40 mg total) by mouth daily. (Patient taking differently: Take 40 mg by mouth daily. ) 180 tablet 3  . glucosamine-chondroitin 500-400 MG tablet Take 1 tablet by mouth daily.    Marland Kitchen losartan (COZAAR) 100 MG tablet Take 1 tablet by mouth once daily 90 tablet 3  . meloxicam (MOBIC) 7.5 MG tablet TAKE 1 TO 2 TABLETS BY MOUTH ONCE DAILY AS NEEDED FOR PAIN (Patient taking differently: Take 7.5 mg by mouth daily. TAKE 1 TABLET BY MOUTH ONCE DAILY FOR PAIN) 60 tablet 3  . Multiple Vitamin (MULTIVITAMIN WITH MINERALS) TABS tablet Take 1 tablet by mouth daily.     . pantoprazole (PROTONIX) 40 MG tablet Take 1 tablet by mouth once daily 90 tablet 3  . potassium chloride SA (KLOR-CON) 20 MEQ tablet Take 1 tablet (20 mEq total) by mouth daily. 90 tablet 1  . vitamin C (ASCORBIC ACID) 500 MG tablet Take 500 mg by mouth daily.     No facility-administered medications prior to visit.    ROS: Review of Systems  Constitutional: Negative for activity change, appetite change, chills, fatigue and unexpected weight change.  HENT: Negative for congestion, mouth sores and sinus pressure.   Eyes: Negative for visual disturbance.  Respiratory: Negative for cough and chest tightness.   Gastrointestinal: Negative for abdominal pain and nausea.  Genitourinary: Negative for difficulty urinating,  frequency and vaginal pain.  Musculoskeletal: Positive for arthralgias. Negative for back pain and gait problem.  Skin: Negative for pallor and rash.  Neurological: Negative for dizziness, tremors, weakness, numbness and headaches.  Psychiatric/Behavioral: Negative for confusion, sleep disturbance and suicidal ideas.    Objective:  BP 138/60   Pulse 69   Temp 97.8 F (36.6 C) (Oral)   Ht 5\' 1"  (1.549 m)   Wt (!) 302 lb 9.6 oz (137.3 kg)   LMP  (LMP Unknown)   SpO2 90%   BMI 57.18 kg/m   BP Readings from Last 3 Encounters:  11/27/20 138/60  11/27/20 138/60  07/11/20 124/81    Wt Readings from Last 3 Encounters:  11/27/20 (!) 302 lb 9.6 oz (137.3 kg)  11/27/20 (!) 302 lb 6.4 oz (137.2 kg)  07/11/20 (!) 305 lb (138.3 kg)    Physical Exam Constitutional:      General: She is not in acute distress.    Appearance: She is well-developed. She is obese.  HENT:     Head: Normocephalic.     Right Ear: External ear normal.     Left Ear: External ear normal.     Nose: Nose normal.  Eyes:     General:        Right eye: No discharge.        Left eye: No discharge.  Conjunctiva/sclera: Conjunctivae normal.     Pupils: Pupils are equal, round, and reactive to light.  Neck:     Thyroid: No thyromegaly.     Vascular: No JVD.     Trachea: No tracheal deviation.  Cardiovascular:     Rate and Rhythm: Normal rate and regular rhythm.     Heart sounds: Normal heart sounds.  Pulmonary:     Effort: No respiratory distress.     Breath sounds: No stridor. No wheezing.  Abdominal:     General: Bowel sounds are normal. There is no distension.     Palpations: Abdomen is soft. There is no mass.     Tenderness: There is no abdominal tenderness. There is no guarding or rebound.  Musculoskeletal:        General: No tenderness.     Cervical back: Normal range of motion and neck supple.  Lymphadenopathy:     Cervical: No cervical adenopathy.  Skin:    Findings: No erythema or rash.    Neurological:     Cranial Nerves: No cranial nerve deficit.     Motor: No abnormal muscle tone.     Coordination: Coordination normal.     Deep Tendon Reflexes: Reflexes normal.  Psychiatric:        Behavior: Behavior normal.        Thought Content: Thought content normal.        Judgment: Judgment normal.     Lab Results  Component Value Date   WBC 4.9 05/22/2020   HGB 12.3 05/22/2020   HCT 38.7 05/22/2020   PLT 201.0 05/22/2020   GLUCOSE 105 (H) 05/22/2020   CHOL 156 05/22/2020   TRIG 93.0 05/22/2020   HDL 43.30 05/22/2020   LDLCALC 94 05/22/2020   ALT 11 05/22/2020   AST 16 05/22/2020   NA 142 05/22/2020   K 4.1 05/22/2020   CL 110 05/22/2020   CREATININE 0.87 05/22/2020   BUN 20 05/22/2020   CO2 23 05/22/2020   TSH 1.37 05/22/2020   INR 1.0 05/22/2020   HGBA1C 6.0 (H) 08/18/2019    No results found.  Assessment & Plan:

## 2020-11-27 NOTE — Assessment & Plan Note (Signed)
Wt Readings from Last 3 Encounters:  11/27/20 (!) 302 lb 9.6 oz (137.3 kg)  11/27/20 (!) 302 lb 6.4 oz (137.2 kg)  07/11/20 (!) 305 lb (138.3 kg)

## 2020-11-27 NOTE — Assessment & Plan Note (Signed)
OA Meloxicam prn

## 2021-02-01 DIAGNOSIS — H524 Presbyopia: Secondary | ICD-10-CM | POA: Diagnosis not present

## 2021-02-01 DIAGNOSIS — H25013 Cortical age-related cataract, bilateral: Secondary | ICD-10-CM | POA: Diagnosis not present

## 2021-02-11 ENCOUNTER — Other Ambulatory Visit: Payer: Self-pay | Admitting: Nurse Practitioner

## 2021-02-11 DIAGNOSIS — C220 Liver cell carcinoma: Secondary | ICD-10-CM | POA: Diagnosis not present

## 2021-02-11 DIAGNOSIS — K7469 Other cirrhosis of liver: Secondary | ICD-10-CM | POA: Diagnosis not present

## 2021-03-04 ENCOUNTER — Ambulatory Visit
Admission: RE | Admit: 2021-03-04 | Discharge: 2021-03-04 | Disposition: A | Discharge status: Home / Self Care | Payer: Medicare Other | Source: Ambulatory Visit | Attending: Nurse Practitioner | Admitting: Nurse Practitioner

## 2021-03-04 ENCOUNTER — Other Ambulatory Visit: Payer: Self-pay

## 2021-03-04 DIAGNOSIS — K760 Fatty (change of) liver, not elsewhere classified: Secondary | ICD-10-CM | POA: Diagnosis not present

## 2021-03-04 DIAGNOSIS — K7469 Other cirrhosis of liver: Secondary | ICD-10-CM

## 2021-03-04 DIAGNOSIS — Z9049 Acquired absence of other specified parts of digestive tract: Secondary | ICD-10-CM | POA: Diagnosis not present

## 2021-03-04 DIAGNOSIS — K7689 Other specified diseases of liver: Secondary | ICD-10-CM | POA: Diagnosis not present

## 2021-03-04 DIAGNOSIS — C220 Liver cell carcinoma: Secondary | ICD-10-CM

## 2021-03-04 MED ORDER — GADOBENATE DIMEGLUMINE 529 MG/ML IV SOLN
20.0000 mL | Freq: Once | INTRAVENOUS | Status: AC | PRN
  Administered 2021-03-04: 20 mL via INTRAVENOUS

## 2021-03-09 ENCOUNTER — Other Ambulatory Visit: Payer: Self-pay | Admitting: Internal Medicine

## 2021-03-19 ENCOUNTER — Other Ambulatory Visit: Payer: Self-pay | Admitting: Interventional Radiology

## 2021-03-19 ENCOUNTER — Other Ambulatory Visit: Payer: Self-pay | Admitting: *Deleted

## 2021-03-19 DIAGNOSIS — C220 Liver cell carcinoma: Secondary | ICD-10-CM

## 2021-03-29 DIAGNOSIS — C220 Liver cell carcinoma: Secondary | ICD-10-CM | POA: Diagnosis not present

## 2021-04-01 LAB — COMPLETE METABOLIC PANEL WITH GFR
AG Ratio: 1.4 (calc) (ref 1.0–2.5)
ALT: 9 U/L (ref 6–29)
AST: 17 U/L (ref 10–35)
Albumin: 4.3 g/dL (ref 3.6–5.1)
Alkaline phosphatase (APISO): 97 U/L (ref 37–153)
BUN/Creatinine Ratio: 21 (calc) (ref 6–22)
BUN: 20 mg/dL (ref 7–25)
CO2: 25 mmol/L (ref 20–32)
Calcium: 9.4 mg/dL (ref 8.6–10.4)
Chloride: 107 mmol/L (ref 98–110)
Creat: 0.95 mg/dL — ABNORMAL HIGH (ref 0.60–0.93)
GFR, Est African American: 70 mL/min/{1.73_m2} (ref >=60)
GFR, Est Non African American: 60 mL/min/{1.73_m2} (ref >=60)
Globulin: 3 g/dL (calc) (ref 1.9–3.7)
Glucose, Bld: 91 mg/dL (ref 65–99)
Potassium: 4.1 mmol/L (ref 3.5–5.3)
Sodium: 142 mmol/L (ref 135–146)
Total Bilirubin: 0.4 mg/dL (ref 0.2–1.2)
Total Protein: 7.3 g/dL (ref 6.1–8.1)

## 2021-04-01 LAB — CBC
HCT: 38.8 % (ref 35.0–45.0)
Hemoglobin: 12.2 g/dL (ref 11.7–15.5)
MCH: 26.4 pg — ABNORMAL LOW (ref 27.0–33.0)
MCHC: 31.4 g/dL — ABNORMAL LOW (ref 32.0–36.0)
MCV: 84 fL (ref 80.0–100.0)
MPV: 10.5 fL (ref 7.5–12.5)
Platelets: 205 10*3/uL (ref 140–400)
RBC: 4.62 10*6/uL (ref 3.80–5.10)
RDW: 12.8 % (ref 11.0–15.0)
WBC: 3.9 10*3/uL (ref 3.8–10.8)

## 2021-04-01 LAB — AFP TUMOR MARKER: AFP-Tumor Marker: 5.7 ng/mL

## 2021-04-01 LAB — PROTIME-INR
INR: 1
Prothrombin Time: 10.2 s (ref 9.0–11.5)

## 2021-04-10 ENCOUNTER — Ambulatory Visit
Admission: RE | Admit: 2021-04-10 | Discharge: 2021-04-10 | Disposition: A | Discharge status: Home / Self Care | Payer: Medicare Other | Source: Ambulatory Visit | Attending: Interventional Radiology | Admitting: Interventional Radiology

## 2021-04-10 ENCOUNTER — Encounter: Payer: Self-pay | Admitting: *Deleted

## 2021-04-10 ENCOUNTER — Other Ambulatory Visit: Payer: Self-pay

## 2021-04-10 DIAGNOSIS — C220 Liver cell carcinoma: Secondary | ICD-10-CM

## 2021-04-10 DIAGNOSIS — Z9889 Other specified postprocedural states: Secondary | ICD-10-CM | POA: Diagnosis not present

## 2021-04-10 HISTORY — PX: IR RADIOLOGIST EVAL & MGMT: IMG5224

## 2021-04-10 NOTE — Progress Notes (Signed)
Chief Complaint: Patient was consulted remotely today (TeleHealth) for hepatocellular cancer at the request of Abeeha Twist K.    Referring Physician(s): Truitt Merle, MD  History of Present Illness: Samantha [name] is a 70 y.o. 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  She underwent conventional lipiodol transarterial chemo embolization on 2/23 and then subsequent CT guided thermal ablation of this lesion on 03/21/2016.  She presents today for continued follow-up evaluation. Her most recent MR imaging March 2022demonstrates no evidence of residual or recurrent disease. She is now 5 years out and remains disease-free. She is feeling well and has no complaints including abdominal pain, nausea, vomiting, diarrhea, chest pain or shortness of breath.  Past Medical History:  Diagnosis Date  . Allergy    seasonal  . Cataract    bilateral - MD just watching  . Cirrhosis ([place]) 2018  . Diverticulosis   . Edema   . Elevated glucose    no longer an issue 03-18-16  . Fatty liver 2017  . GERD (gastroesophageal reflux disease)   . H/O seasonal allergies   . Hepatitis C    hepatitis c completed  tx march 2017  . Hyperlipidemia 2011  . Hypertension   . LBP (low back pain)   . Liver cancer ([place]) 2017   IN REMISSION. WHEN IDAGNONED SIZE OF GRAPEFRUIT   . Morbid obesity ([place])   . Osteoarthritis    B knees  . Right middle lobe pulmonary nodule    resolved 2018  . Uterine fibroid   . Wears glasses     Past Surgical History:  Procedure Laterality Date  . BIOPSY  07/04/2019   Procedure: BIOPSY;  Surgeon: Jerene Bears, MD;  Location: [facility];  Service: Gastroenterology;;  . BIOPSY  07/11/2020   Procedure: BIOPSY;  Surgeon: Lavena Bullion, DO;  Location: [facility];  Service: Gastroenterology;;  . CHOLECYSTECTOMY    . COLONOSCOPY N/A 01/09/2015   Procedure:  COLONOSCOPY;  Surgeon: Jerene Bears, MD;  Location: [facility];  Service: Gastroenterology;  Laterality: N/A;  . COLONOSCOPY WITH PROPOFOL N/A 06/06/2020   Procedure: COLONOSCOPY WITH PROPOFOL;  Surgeon: Lavena Bullion, DO;  Location: [facility];  Service: Gastroenterology;  Laterality: N/A;  . COLONOSCOPY WITH PROPOFOL N/A 07/11/2020   Procedure: COLONOSCOPY WITH PROPOFOL;  Surgeon: Lavena Bullion, DO;  Location: [facility];  Service: Gastroenterology;  Laterality: N/A;  . ESOPHAGOGASTRODUODENOSCOPY (EGD) WITH PROPOFOL N/A 06/10/2016   Procedure: ESOPHAGOGASTRODUODENOSCOPY (EGD) WITH PROPOFOL;  Surgeon: Jerene Bears, MD;  [facility]: [facility];  Service: Gastroenterology;  Laterality: N/A;  . ESOPHAGOGASTRODUODENOSCOPY (EGD) WITH PROPOFOL N/A 07/04/2019   Procedure: ESOPHAGOGASTRODUODENOSCOPY (EGD) WITH PROPOFOL;  Surgeon: Jerene Bears, MD;  Location: [facility];  Service: Gastroenterology;  Laterality: N/A;  . HYSTEROSCOPY WITH D & C N/A 10/15/2018   Procedure: DILATATION AND CURETTAGE /HYSTEROSCOPY;  Surgeon: Aloha Gell, MD;  Location: [facility];  Service: Gynecology;  Laterality: N/A;  . IR GENERIC HISTORICAL  07/03/2016   IR RADIOLOGIST EVAL & MGMT 07/03/2016 [name], MD [facility]  . IR GENERIC HISTORICAL  11/18/2016   IR RADIOLOGIST EVAL & MGMT 11/18/2016 [name], MD [facility]  . IR RADIOLOGIST EVAL & MGMT  04/09/2017  . IR RADIOLOGIST EVAL & MGMT  11/05/2017  . IR RADIOLOGIST EVAL & MGMT  03/13/2020  . IR RADIOLOGIST EVAL & MGMT  08/30/2020  . JOINT REPLACEMENT  BILATERAL KNEES  . left shoulder arthroplasty    . POLYPECTOMY  06/06/2020   Procedure: POLYPECTOMY;  Surgeon: Lavena Bullion, DO;  Location: [facility];  Service: Gastroenterology;;  . POLYPECTOMY  07/11/2020   Procedure: POLYPECTOMY;  Surgeon: Lavena Bullion, DO;  Location: [facility];  Service: Gastroenterology;;  . right knee replacement  2007  . total knee replacement L   03-13-08  . TUBAL LIGATION      Allergies: Amlodipine, Benazepril hcl, and Codeine  Medications: Prior to Admission medications   Medication Sig Start Date End Date Taking? Authorizing Provider  aspirin EC 81 MG tablet Take 81 mg by mouth daily.    [provider]  BLACK COHOSH EXTRACT PO Take 1 tablet by mouth daily.    [provider]  Cholecalciferol (EQL VITAMIN D3) 1000 UNITS tablet Take 1,000 Units by mouth daily.     [provider]  EQ LORATADINE 10 MG tablet Take 1 tablet by mouth once daily 08/14/20   Plotnikov, Evie Lacks, MD  furosemide (LASIX) 20 MG tablet Take 1-2 tablets (20-40 mg total) by mouth daily. 11/27/20   Plotnikov, Evie Lacks, MD  glucosamine-chondroitin 500-400 MG tablet Take 1 tablet by mouth daily.    [provider]  losartan (COZAAR) 100 MG tablet Take 1 tablet (100 mg total) by mouth daily. 11/27/20   Plotnikov, Evie Lacks, MD  meloxicam (MOBIC) 7.5 MG tablet TAKE 1 TO 2 TABLETS BY MOUTH ONCE DAILY AS NEEDED FOR PAIN 11/27/20   Plotnikov, Evie Lacks, MD  Multiple Vitamin (MULTIVITAMIN WITH MINERALS) TABS tablet Take 1 tablet by mouth daily.     [provider]  pantoprazole (PROTONIX) 40 MG tablet Take 1 tablet (40 mg total) by mouth daily. 11/27/20   Plotnikov, Evie Lacks, MD  potassium chloride SA (KLOR-CON) 20 MEQ tablet Take 1 tablet (20 mEq total) by mouth daily. Annual appt due in May must see provider for future refills 03/11/21   Plotnikov, Evie Lacks, MD  vitamin C (ASCORBIC ACID) 500 MG tablet Take 500 mg by mouth daily.    [provider]  promethazine (PHENERGAN) 12.5 MG tablet Take 1 tablet (12.5 mg total) by mouth every 6 (six) hours as needed for nausea or vomiting. 08/09/19 09/27/20  Janith Lima, MD     Family History  Problem Relation Age of Onset  . Hypertension Mother   . Ovarian cancer Mother   . Hypertension Father   . Cancer Father 12       prostate cancer   . Diabetes Father   .  Diabetes Other   . Hypertension Other   . Colon cancer Neg Hx   . Esophageal cancer Neg Hx   . Rectal cancer Neg Hx   . Stomach cancer Neg Hx   . Breast cancer Neg Hx     Social History   Socioeconomic History  . Marital status: Single    Spouse name: Not on file  . Number of children: 4  . Years of education: Not on file  . Highest education level: Not on file  Occupational History  . Not on file  Tobacco Use  . Smoking status: Former Smoker    Packs/day: 0.25    Years: 20.00    Pack years: 5.00    Quit date: 12/30/2007    Years since quitting: 13.2  . Smokeless tobacco: Never Used  Vaping Use  . Vaping Use: Never used  Substance and Sexual Activity  . Alcohol use:  No    Comment: 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, quit drinking alcohol in 10/2015   . Drug use: No  . Sexual activity: Not Currently    Birth control/protection: Post-menopausal  Other Topics Concern  . 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 Difficulty of Paying Living Expenses: Not hard at all  Food Insecurity: No Food Insecurity  . Worried About Charity fundraiser in the Last Year: Never true  . Ran Out of Food in the Last Year: Never true  Transportation Needs: No Transportation Needs  . Lack of Transportation (Medical): No  . Lack of Transportation (Non-Medical): No  Physical Activity: Sufficiently Active  . Days of Exercise per Week: 5 days  . Minutes of Exercise per Session: 30 min  Stress: No Stress Concern Present  . Feeling of Stress : Not at all  Social Connections: Socially Isolated  . Frequency of Communication with Friends and Family:  More than three times a week  . Frequency of Social Gatherings with Friends and Family: More than three times a week  . Attends Religious Services: Never  . Active Member of Clubs or Organizations: No  . Attends Archivist Meetings: Never  . Marital Status: Divorced    ECOG Status: 0 - Asymptomatic  Review of Systems  Review of Systems: A 12 point ROS discussed and pertinent positives are indicated in the HPI above.  All other systems are negative.  Physical Exam No direct physical exam was performed (except for noted visual exam findings with Video Visits).    Vital Signs: LMP  (LMP Unknown)   Imaging: No results found.  Labs:  CBC: Recent Labs    05/22/20 0931 03/29/21 0000  WBC 4.9 3.9  HGB 12.3 12.2  HCT 38.7 38.8  PLT 201.0 205    COAGS: Recent Labs    05/22/20 0931 03/29/21 0000  INR 1.0 1.0    BMP: Recent Labs    05/22/20 0931 11/27/20 0957 03/29/21 0000  NA 142 140 142  K 4.1 4.2 4.1  CL 110 104 107  CO2 23 29 25   GLUCOSE 105* 101* 91  BUN 20 13 20   CALCIUM 9.3 9.4 9.4  CREATININE 0.87 0.93 0.95*  GFRNONAA  --   --  60  GFRAA  --   --  70    LIVER FUNCTION TESTS: Recent Labs    05/22/20 0931 11/27/20 0957 03/29/21 0000  BILITOT 0.3 0.5 0.4  AST 16 16 17   ALT 11 9 9   ALKPHOS 94 82  --   PROT 7.4 7.5 7.3  ALBUMIN 4.3 4.1  --     TUMOR MARKERS: Recent Labs    03/29/21 0000  AFPTM 5.7    Assessment and Plan:  70 year old female doing very well 5 years status post combined transarterial chemoembolization and percutaneous microwave ablation of hepatocellular carcinoma.  She remains effectively disease-free without evidence of local recurrence or new disease.  At this point, we can likely extend surveillance imaging to once annually.  1.)  Follow-up gadolinium enhanced MRI of the abdomen and accompanying clinic  visit in 1 year.    Electronically Signed: Criselda Peaches 04/10/2021, 1:48 PM   I spent a total  of 15 Minutes in remote  clinical consultation, greater than 50% of which was counseling/coordinating care for hepatocellular carcinoma.    Visit type: Audio only (telephone). Audio (no video) only due to patient preference. Alternative for in-person consultation at [facility], [address], [place], [place]. This visit type was conducted due to national recommendations for restrictions regarding the COVID-19 Pandemic (e.g. social distancing).  This format is felt to be most appropriate for this patient at this time.  All issues noted in this document were discussed and addressed.

## 2021-04-20 ENCOUNTER — Emergency Department (HOSPITAL_COMMUNITY)
Admission: EM | Admit: 2021-04-20 | Discharge: 2021-04-20 | Disposition: A | Discharge status: Home / Self Care | Payer: Medicare Other | Attending: Emergency Medicine | Admitting: Emergency Medicine

## 2021-04-20 ENCOUNTER — Encounter (HOSPITAL_COMMUNITY): Payer: Self-pay | Admitting: Emergency Medicine

## 2021-04-20 DIAGNOSIS — Z79899 Other long term (current) drug therapy: Secondary | ICD-10-CM | POA: Insufficient documentation

## 2021-04-20 DIAGNOSIS — S30861A Insect bite (nonvenomous) of abdominal wall, initial encounter: Secondary | ICD-10-CM | POA: Diagnosis not present

## 2021-04-20 DIAGNOSIS — Z96653 Presence of artificial knee joint, bilateral: Secondary | ICD-10-CM | POA: Insufficient documentation

## 2021-04-20 DIAGNOSIS — W57XXXA Bitten or stung by nonvenomous insect and other nonvenomous arthropods, initial encounter: Secondary | ICD-10-CM | POA: Diagnosis not present

## 2021-04-20 DIAGNOSIS — Z7982 Long term (current) use of aspirin: Secondary | ICD-10-CM | POA: Diagnosis not present

## 2021-04-20 DIAGNOSIS — Z8505 Personal history of malignant neoplasm of liver: Secondary | ICD-10-CM | POA: Insufficient documentation

## 2021-04-20 DIAGNOSIS — Z87891 Personal history of nicotine dependence: Secondary | ICD-10-CM | POA: Diagnosis not present

## 2021-04-20 DIAGNOSIS — I10 Essential (primary) hypertension: Secondary | ICD-10-CM | POA: Insufficient documentation

## 2021-04-20 MED ORDER — DIPHENHYDRAMINE HCL 25 MG PO CAPS
50.0000 mg | ORAL_CAPSULE | Freq: Once | ORAL | Status: AC
  Administered 2021-04-20: 50 mg via ORAL
  Filled 2021-04-20: qty 2

## 2021-04-20 MED ORDER — BACITRACIN ZINC 500 UNIT/GM EX OINT
TOPICAL_OINTMENT | Freq: Once | CUTANEOUS | Status: AC
  Filled 2021-04-20: qty 0.9

## 2021-04-20 NOTE — ED Triage Notes (Signed)
Patient reports suspected insect bite to left lower abdomen with redness and soreness x4 hours. Denies taking blood pressure medication today.

## 2021-04-20 NOTE — ED Provider Notes (Signed)
[facility] DEPT Provider Note   CSN: [id number] Arrival date & time: 04/20/21  1849     History Chief Complaint  Patient presents with  . Insect Bite    Samantha [name] is a 70 y.o. female.  She is very concerned because she has had abdominal cellulitis before.  The history is provided by the patient.  Rash Location:  Torso Torso rash location:  Abd LLQ Quality: painful and redness   Pain details:    Quality:  Burning   Severity:  Moderate   Onset quality:  Sudden   Timing:  Constant   Progression:  Unchanged Severity:  Moderate Onset quality:  Sudden Timing:  Constant Progression:  Unchanged Chronicity:  New Context comment:  Sat down in the chair at a friend's house and immediately felt something bite her. Relieved by:  Nothing Worsened by:  Nothing Ineffective treatments:  None tried Associated symptoms: no abdominal pain, no fever, no joint pain, no nausea, no shortness of breath, no sore throat and not vomiting        Past Medical History:  Diagnosis Date  . Allergy    seasonal  . Cataract    bilateral - MD just watching  . Cirrhosis ([place]) 2018  . Diverticulosis   . Edema   . Elevated glucose    no longer an issue 03-18-16  . Fatty liver 2017  . GERD (gastroesophageal reflux disease)   . H/O seasonal allergies   . Hepatitis C    hepatitis c completed  tx march 2017  . Hyperlipidemia 2011  . Hypertension   . LBP (low back pain)   . Liver cancer ([place]) 2017   IN REMISSION. WHEN IDAGNONED SIZE OF GRAPEFRUIT   . Morbid obesity ([place])   . Osteoarthritis    B knees  . Right middle lobe pulmonary nodule    resolved 2018  . Uterine fibroid   . Wears glasses     Patient Active Problem List   Diagnosis Date Noted  . Polyp of ascending colon   . Polyp of cecum   . Polyp of descending colon   . Polyp of sigmoid colon   . Diverticulosis of colon without hemorrhage   . Internal hemorrhoids   . MRSA cellulitis    . Abscess of skin of abdomen 08/09/2019  . Cellulitis of abdominal wall 08/05/2019  . Irregular Z line of esophagus   . Vagina bleeding 05/14/2018  . Gastritis   . Edema 04/14/2016  . Liver cirrhosis secondary to NASH ([place]) 03/03/2016  . Hepatocellular carcinoma ([place]) 12/20/2015  . Chronic hepatitis C without hepatic coma ([place]) 12/05/2015  . Liver lesion, right lobe 12/05/2015  . Elevated LFTs 08/15/2015  . Screening for colon cancer   . Personal history of colonic polyps   . Benign neoplasm of transverse colon   . Cystitis 08/04/2013  . Well adult exam 11/30/2012  . Fatty liver 03/23/2012  . Special screening for malignant neoplasms, colon 12/03/2011  . Benign neoplasm of colon 12/03/2011  . Diverticulosis of colon (without mention of hemorrhage) 12/03/2011  . 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 Bronchitis 11/25/2011  . Neck pain, chronic 08/22/2011  . COUGH 10/23/2010  . NONSPEC ELEVATION OF LEVELS OF TRANSAMINASE/LDH 09/23/2010  . WART, VIRAL 09/20/2010  . Hyperlipidemia 2011  . Anemia 2011  . OSTEOARTHRITIS 08/07/2009  . GERD 11/14/2008  . Low back pain 11/14/2008  . SINUSITIS, ACUTE 09/28/2008  . NEOPLASM OF UNCERTAIN BEHAVIOR OF SKIN 07/14/2008  .  OBESITY, MORBID 05/04/2008  . KNEE PAIN 05/04/2008  . Hyperglycemia 05/04/2008  . VARICOSE VEINS, LOWER EXTREMITIES 04/04/2008  . Essential hypertension 03/31/2008  . MUSCLE CRAMPS 03/31/2008  . Urinary frequency 03/31/2008    Past Surgical History:  Procedure Laterality Date  . BIOPSY  07/04/2019   Procedure: BIOPSY;  Surgeon: Beverley Fiedler, MD;  Location: [facility];  Service: Gastroenterology;;  . BIOPSY  07/11/2020   Procedure: BIOPSY;  Surgeon: Shellia Cleverly, DO;  Location: [facility];  Service: Gastroenterology;;  . CHOLECYSTECTOMY    . COLONOSCOPY N/A 01/09/2015   Procedure: COLONOSCOPY;  Surgeon: Beverley Fiedler, MD;  Location: [facility];  Service: Gastroenterology;  Laterality: N/A;  . COLONOSCOPY WITH  PROPOFOL N/A 06/06/2020   Procedure: COLONOSCOPY WITH PROPOFOL;  Surgeon: Shellia Cleverly, DO;  Location: [facility];  Service: Gastroenterology;  Laterality: N/A;  . COLONOSCOPY WITH PROPOFOL N/A 07/11/2020   Procedure: COLONOSCOPY WITH PROPOFOL;  Surgeon: Shellia Cleverly, DO;  Location: [facility];  Service: Gastroenterology;  Laterality: N/A;  . ESOPHAGOGASTRODUODENOSCOPY (EGD) WITH PROPOFOL N/A 06/10/2016   Procedure: ESOPHAGOGASTRODUODENOSCOPY (EGD) WITH PROPOFOL;  Surgeon: Beverley Fiedler, MD;  Location: [facility];  Service: Gastroenterology;  Laterality: N/A;  . ESOPHAGOGASTRODUODENOSCOPY (EGD) WITH PROPOFOL N/A 07/04/2019   Procedure: ESOPHAGOGASTRODUODENOSCOPY (EGD) WITH PROPOFOL;  Surgeon: Beverley Fiedler, MD;  Location: [facility];  Service: Gastroenterology;  Laterality: N/A;  . HYSTEROSCOPY WITH D & C N/A 10/15/2018   Procedure: DILATATION AND CURETTAGE /HYSTEROSCOPY;  Surgeon: Noland Fordyce, MD;  Location: [facility];  Service: Gynecology;  Laterality: N/A;  . IR GENERIC HISTORICAL  07/03/2016   IR RADIOLOGIST EVAL & MGMT 07/03/2016 Malachy Moan, MD [facility]  . IR GENERIC HISTORICAL  11/18/2016   IR RADIOLOGIST EVAL & MGMT 11/18/2016 Malachy Moan, MD [facility]  . IR RADIOLOGIST EVAL & MGMT  04/09/2017  . IR RADIOLOGIST EVAL & MGMT  11/05/2017  . IR RADIOLOGIST EVAL & MGMT  03/13/2020  . IR RADIOLOGIST EVAL & MGMT  08/30/2020  . IR RADIOLOGIST EVAL & MGMT  04/10/2021  . JOINT REPLACEMENT     BILATERAL KNEES  . left shoulder arthroplasty    . POLYPECTOMY  06/06/2020   Procedure: POLYPECTOMY;  Surgeon: Shellia Cleverly, DO;  Location: [facility];  Service: Gastroenterology;;  . POLYPECTOMY  07/11/2020   Procedure: POLYPECTOMY;  Surgeon: Shellia Cleverly, DO;  Location: [facility];  Service: Gastroenterology;;  . right knee replacement  2007  . total knee replacement L  03-13-08  . TUBAL LIGATION       OB History   No obstetric history on file.      Family History  Problem Relation Age of Onset  . Hypertension Mother   . Ovarian cancer Mother   . Hypertension Father   . Cancer Father 6       prostate cancer   . Diabetes Father   . Diabetes Other   . Hypertension Other   . Colon cancer Neg Hx   . Esophageal cancer Neg Hx   . Rectal cancer Neg Hx   . Stomach cancer Neg Hx   . Breast cancer Neg Hx     Social History   Tobacco Use  . Smoking status: Former Smoker    Packs/day: 0.25    Years: 20.00    Pack years: 5.00    Quit date: 12/30/2007    Years since quitting: 13.3  . Smokeless tobacco: Never Used  Vaping Use  .  Vaping Use: Never used  Substance Use Topics  . 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 Drug use: No    Home Medications Prior to Admission medications   Medication Sig Start Date End Date Taking? Authorizing Provider  aspirin EC 81 MG tablet Take 81 mg by mouth daily.    [provider]  BLACK COHOSH EXTRACT PO Take 1 tablet by mouth daily.    [provider]  Cholecalciferol (EQL VITAMIN D3) 1000 UNITS tablet Take 1,000 Units by mouth daily.     [provider]  EQ LORATADINE 10 MG tablet Take 1 tablet by mouth once daily 08/14/20   Plotnikov, Evie Lacks, MD  furosemide (LASIX) 20 MG tablet Take 1-2 tablets (20-40 mg total) by mouth daily. 11/27/20   Plotnikov, Evie Lacks, MD  glucosamine-chondroitin 500-400 MG tablet Take 1 tablet by mouth daily.    [provider]  losartan (COZAAR) 100 MG tablet Take 1 tablet (100 mg total) by mouth daily. 11/27/20   Plotnikov, Evie Lacks, MD  meloxicam (MOBIC) 7.5 MG tablet TAKE 1 TO 2 TABLETS BY MOUTH ONCE DAILY AS NEEDED FOR PAIN 11/27/20   Plotnikov, Evie Lacks, MD  Multiple Vitamin (MULTIVITAMIN WITH MINERALS) TABS tablet Take 1 tablet by mouth daily.     [provider]  pantoprazole (PROTONIX) 40 MG tablet Take 1 tablet (40 mg total) by mouth  daily. 11/27/20   Plotnikov, Evie Lacks, MD  potassium chloride SA (KLOR-CON) 20 MEQ tablet Take 1 tablet (20 mEq total) by mouth daily. Annual appt due in May must see provider for future refills 03/11/21   Plotnikov, Evie Lacks, MD  vitamin C (ASCORBIC ACID) 500 MG tablet Take 500 mg by mouth daily.    [provider]  promethazine (PHENERGAN) 12.5 MG tablet Take 1 tablet (12.5 mg total) by mouth every 6 (six) hours as needed for nausea or vomiting. 08/09/19 09/27/20  Janith Lima, MD    Allergies    Amlodipine, Benazepril hcl, and Codeine  Review of Systems   Review of Systems  Constitutional: Negative for chills and fever.  HENT: Negative for ear pain and sore throat.   Eyes: Negative for pain and visual disturbance.  Respiratory: Negative for cough and shortness of breath.   Cardiovascular: Negative for chest pain and palpitations.  Gastrointestinal: Negative for abdominal pain, nausea and vomiting.  Genitourinary: Negative for dysuria and hematuria.  Musculoskeletal: Negative for arthralgias and back pain.  Skin: Positive for rash. Negative for color change.  Neurological: Negative for seizures and syncope.  All other systems reviewed and are negative.   Physical Exam Updated Vital Signs BP (!) 174/126 (BP Location: Left Arm)   Pulse (!) 105   Temp 99 F (37.2 C) (Oral)   Resp 16   LMP  (LMP Unknown)   SpO2 98%   Physical Exam Vitals and nursing note reviewed.  HENT:     Head: Normocephalic and atraumatic.  Eyes:     General: No scleral icterus. Pulmonary:     Effort: Pulmonary effort is normal. No respiratory distress.  Musculoskeletal:     Cervical back: Normal range of motion.  Skin:    General: Skin is warm and dry.     Comments: There is what appears to be an insect bite at the left lower quadrant with surrounding erythema.  Neurological:     Mental Status: She is  alert.  Psychiatric:        Mood and Affect: Mood normal.     ED Results /  Procedures / Treatments   Labs (all labs ordered are listed, but only abnormal results are displayed) Labs Reviewed - No data to display  EKG None  Radiology No results found.  Procedures Procedures   Medications Ordered in ED Medications  diphenhydrAMINE (BENADRYL) capsule 50 mg (has no administration in time range)  bacitracin ointment (has no administration in time range)    ED Course  I have reviewed the triage vital signs and the nursing notes.  Pertinent labs & imaging results that were available during my care of the patient were reviewed by me and considered in my medical decision making (see chart for details).    MDM Rules/Calculators/A&P                          The patient I talked about the fact that this is an insect bite.  She was completely asymptomatic, felt something bite her, and developed symptoms immediately.  This is not the typical course of cellulitis, but I did counsel her on the need to keep the area clean, dry, and monitor for signs of infection as any break in the skin can increase the likelihood of secondary infection. Final Clinical Impression(s) / ED Diagnoses Final diagnoses:  Insect bite of abdominal wall, initial encounter    Rx / DC Orders ED Discharge Orders    None       Arnaldo Natal, MD 04/20/21 2011

## 2021-04-23 ENCOUNTER — Ambulatory Visit
Admission: EM | Admit: 2021-04-23 | Discharge: 2021-04-23 | Disposition: A | Discharge status: Home / Self Care | Payer: Medicare Other | Attending: Emergency Medicine | Admitting: Emergency Medicine

## 2021-04-23 ENCOUNTER — Encounter: Payer: Self-pay | Admitting: *Deleted

## 2021-04-23 ENCOUNTER — Other Ambulatory Visit: Payer: Self-pay

## 2021-04-23 DIAGNOSIS — L03311 Cellulitis of abdominal wall: Secondary | ICD-10-CM

## 2021-04-23 MED ORDER — DOXYCYCLINE HYCLATE 100 MG PO CAPS: 100.0000 mg | ORAL_CAPSULE | Freq: Two times a day (BID) | ORAL | 0 refills | Status: AC

## 2021-04-23 MED ORDER — CEFTRIAXONE SODIUM 1 G IJ SOLR
1.0000 g | Freq: Once | INTRAMUSCULAR | Status: AC
  Administered 2021-04-23: 1 g via INTRAMUSCULAR

## 2021-04-23 MED ORDER — IBUPROFEN 800 MG PO TABS: 800.0000 mg | ORAL_TABLET | Freq: Three times a day (TID) | ORAL | 0 refills | Status: DC

## 2021-04-23 MED ORDER — HYDROCODONE-ACETAMINOPHEN 5-325 MG PO TABS: 1.0000 | ORAL_TABLET | Freq: Four times a day (QID) | ORAL | 0 refills | Status: DC | PRN

## 2021-04-23 NOTE — Discharge Instructions (Addendum)
We gave you a shot of Rocephin Continue with doxycycline twice daily for 10 days Tylenol and ibuprofen for pain, fevers Hydrocodone for severe pain-use sparingly and only for severe pain, will cause drowsiness, do not drive or work after taking Please follow-up at emergency room in 48 hours if not seeing any improvement or worsening

## 2021-04-23 NOTE — ED Provider Notes (Signed)
[facility]    CSN: [id number] Arrival date & time: 04/23/21  1105      History   Chief Complaint Chief Complaint  Patient presents with  . Insect Bite    HPI Samantha [name] is a 70 y.o. female history of hyperlipidemia, hypertension, presenting today for evaluation of insect bite to lower abdomen.  Reports increased redness pain and swelling to abdomen.  Seen in the emergency room 3 days ago for similar and treated for insect bite, applied bacitracin and recommended monitoring.  Symptoms progressing and worsening since.  Has history of prior abdominal cellulitis.  Reports low-grade fevers up to 99.  HPI  Past Medical History:  Diagnosis Date  . Allergy    seasonal  . Cataract    bilateral - MD just watching  . Cirrhosis ([place]) 2018  . Diverticulosis   . Edema   . Elevated glucose    no longer an issue 03-18-16  . Fatty liver 2017  . GERD (gastroesophageal reflux disease)   . H/O seasonal allergies   . Hepatitis C    hepatitis c completed  tx march 2017  . Hyperlipidemia 2011  . Hypertension   . LBP (low back pain)   . Liver cancer ([place]) 2017   IN REMISSION. WHEN IDAGNONED SIZE OF GRAPEFRUIT   . Morbid obesity ([place])   . Osteoarthritis    B knees  . Right middle lobe pulmonary nodule    resolved 2018  . Uterine fibroid   . Wears glasses     Patient Active Problem List   Diagnosis Date Noted  . Polyp of ascending colon   . Polyp of cecum   . Polyp of descending colon   . Polyp of sigmoid colon   . Diverticulosis of colon without hemorrhage   . Internal hemorrhoids   . MRSA cellulitis   . Abscess of skin of abdomen 08/09/2019  . Cellulitis of abdominal wall 08/05/2019  . Irregular Z line of esophagus   . Vagina bleeding 05/14/2018  . Gastritis   . Edema 04/14/2016  . 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 Hepatocellular carcinoma ([place]) 12/20/2015  . Chronic hepatitis C without hepatic coma ([place]) 12/05/2015  . Liver  lesion, right lobe 12/05/2015  . Elevated LFTs 08/15/2015  . Screening for colon cancer   . Personal history of colonic polyps   . Benign neoplasm of transverse colon   . Cystitis 08/04/2013  . Well adult exam 11/30/2012  . Fatty liver 03/23/2012  . Special screening for malignant neoplasms, colon 12/03/2011  . Benign neoplasm of colon 12/03/2011  . Diverticulosis of colon (without mention of hemorrhage) 12/03/2011  . 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 Bronchitis 11/25/2011  . Neck pain, chronic 08/22/2011  . COUGH 10/23/2010  . NONSPEC ELEVATION OF LEVELS OF TRANSAMINASE/LDH 09/23/2010  . WART, VIRAL 09/20/2010  . Hyperlipidemia 2011  . Anemia 2011  . OSTEOARTHRITIS 08/07/2009  . GERD 11/14/2008  . Low back pain 11/14/2008  . SINUSITIS, ACUTE 09/28/2008  . NEOPLASM OF UNCERTAIN BEHAVIOR OF SKIN 07/14/2008  . OBESITY, MORBID 05/04/2008  . KNEE PAIN 05/04/2008  . Hyperglycemia 05/04/2008  . VARICOSE VEINS, LOWER EXTREMITIES 04/04/2008  . Essential hypertension 03/31/2008  . MUSCLE CRAMPS 03/31/2008  . Urinary frequency 03/31/2008    Past Surgical History:  Procedure Laterality Date  . BIOPSY  07/04/2019   Procedure: BIOPSY;  Surgeon: Jerene Bears, MD;  Location: [facility];  Service: Gastroenterology;;  . BIOPSY  07/11/2020  Procedure: BIOPSY;  Surgeon: Lavena Bullion, DO;  Location: [facility];  Service: Gastroenterology;;  . CHOLECYSTECTOMY    . COLONOSCOPY N/A 01/09/2015   Procedure: COLONOSCOPY;  Surgeon: Jerene Bears, MD;  Location: [facility];  Service: Gastroenterology;  Laterality: N/A;  . COLONOSCOPY WITH PROPOFOL N/A 06/06/2020   Procedure: COLONOSCOPY WITH PROPOFOL;  Surgeon: Lavena Bullion, DO;  Location: [facility];  Service: Gastroenterology;  Laterality: N/A;  . COLONOSCOPY WITH PROPOFOL N/A 07/11/2020   Procedure: COLONOSCOPY WITH PROPOFOL;  Surgeon: Lavena Bullion, DO;  Location: [facility];  Service: Gastroenterology;  Laterality: N/A;  .  ESOPHAGOGASTRODUODENOSCOPY (EGD) WITH PROPOFOL N/A 06/10/2016   Procedure: ESOPHAGOGASTRODUODENOSCOPY (EGD) WITH PROPOFOL;  Surgeon: Jerene Bears, MD;  Location: [facility];  Service: Gastroenterology;  Laterality: N/A;  . ESOPHAGOGASTRODUODENOSCOPY (EGD) WITH PROPOFOL N/A 07/04/2019   Procedure: ESOPHAGOGASTRODUODENOSCOPY (EGD) WITH PROPOFOL;  Surgeon: Jerene Bears, MD;  Location: [facility];  Service: Gastroenterology;  Laterality: N/A;  . HYSTEROSCOPY WITH D & C N/A 10/15/2018   Procedure: DILATATION AND CURETTAGE /HYSTEROSCOPY;  Surgeon: Aloha Gell, MD;  Location: [facility];  Service: Gynecology;  Laterality: N/A;  . IR GENERIC HISTORICAL  07/03/2016   IR RADIOLOGIST EVAL & MGMT 07/03/2016 [name], MD [facility]  . IR GENERIC HISTORICAL  11/18/2016   IR RADIOLOGIST EVAL & MGMT 11/18/2016 [name], MD [facility]  . IR RADIOLOGIST EVAL & MGMT  04/09/2017  . IR RADIOLOGIST EVAL & MGMT  11/05/2017  . IR RADIOLOGIST EVAL & MGMT  03/13/2020  . IR RADIOLOGIST EVAL & MGMT  08/30/2020  . IR RADIOLOGIST EVAL & MGMT  04/10/2021  . JOINT REPLACEMENT     BILATERAL KNEES  . left shoulder arthroplasty    . POLYPECTOMY  06/06/2020   Procedure: POLYPECTOMY;  Surgeon: Lavena Bullion, DO;  Location: [facility];  Service: Gastroenterology;;  . POLYPECTOMY  07/11/2020   Procedure: POLYPECTOMY;  Surgeon: Lavena Bullion, DO;  Location: [facility];  Service: Gastroenterology;;  . right knee replacement  2007  . total knee replacement L  03-13-08  . TUBAL LIGATION      OB History   No obstetric history on file.      Home Medications    Prior to Admission medications   Medication Sig Start Date End Date Taking? Authorizing Provider  aspirin EC 81 MG tablet Take 81 mg by mouth daily.   Yes [provider]  BLACK COHOSH EXTRACT PO Take 1 tablet by mouth daily.   Yes [provider]  Cholecalciferol 25 MCG (1000 UT) tablet Take 1,000 Units by mouth  daily.    Yes [provider]  doxycycline (VIBRAMYCIN) 100 MG capsule Take 1 capsule (100 mg total) by mouth 2 (two) times daily for 10 days. 04/23/21 05/03/21 Yes Genean Adamski C, PA-C  EQ LORATADINE 10 MG tablet Take 1 tablet by mouth once daily 08/14/20  Yes Plotnikov, Evie Lacks, MD  furosemide (LASIX) 20 MG tablet Take 1-2 tablets (20-40 mg total) by mouth daily. 11/27/20  Yes Plotnikov, Evie Lacks, MD  glucosamine-chondroitin 500-400 MG tablet Take 1 tablet by mouth daily.   Yes [provider]  HYDROcodone-acetaminophen (NORCO/VICODIN) 5-325 MG tablet Take 1-2 tablets by mouth every 6 (six) hours as needed for severe pain. 04/23/21  Yes Ziare Cryder C, PA-C  ibuprofen (ADVIL) 800 MG tablet Take 1 tablet (800 mg total) by mouth 3 (three) times daily. 04/23/21  Yes Laquida Cotrell C, PA-C  losartan (  COZAAR) 100 MG tablet Take 1 tablet (100 mg total) by mouth daily. 11/27/20  Yes Plotnikov, Evie Lacks, MD  meloxicam (MOBIC) 7.5 MG tablet TAKE 1 TO 2 TABLETS BY MOUTH ONCE DAILY AS NEEDED FOR PAIN 11/27/20  Yes Plotnikov, Evie Lacks, MD  Multiple Vitamin (MULTIVITAMIN WITH MINERALS) TABS tablet Take 1 tablet by mouth daily.    Yes [provider]  pantoprazole (PROTONIX) 40 MG tablet Take 1 tablet (40 mg total) by mouth daily. 11/27/20  Yes Plotnikov, Evie Lacks, MD  potassium chloride SA (KLOR-CON) 20 MEQ tablet Take 1 tablet (20 mEq total) by mouth daily. Annual appt due in May must see provider for future refills 03/11/21  Yes Plotnikov, Evie Lacks, MD  vitamin C (ASCORBIC ACID) 500 MG tablet Take 500 mg by mouth daily.   Yes [provider]  promethazine (PHENERGAN) 12.5 MG tablet Take 1 tablet (12.5 mg total) by mouth every 6 (six) hours as needed for nausea or vomiting. 08/09/19 09/27/20  Janith Lima, MD    Family History Family History  Problem Relation Age of Onset  . Hypertension Mother   . Ovarian cancer Mother   . Hypertension Father   . Cancer Father  32       prostate cancer   . Diabetes Father   . Diabetes Other   . Hypertension Other   . Colon cancer Neg Hx   . Esophageal cancer Neg Hx   . Rectal cancer Neg Hx   . Stomach cancer Neg Hx   . Breast cancer Neg Hx     Social History Social History   Tobacco Use  . Smoking status: Former Smoker    Packs/day: 0.25    Years: 20.00    Pack years: 5.00    Quit date: 12/30/2007    Years since quitting: 13.3  . Smokeless tobacco: Never Used  Vaping Use  . Vaping Use: Never used  Substance Use Topics  . 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 Drug use: No     Allergies   Amlodipine, Benazepril hcl, and Codeine   Review of Systems Review of Systems  Constitutional: Negative for fatigue and fever.  HENT: Negative for mouth sores.   Eyes: Negative for visual disturbance.  Respiratory: Negative for shortness of breath.   Cardiovascular: Negative for chest pain.  Gastrointestinal: Positive for abdominal pain. Negative for nausea and vomiting.  Genitourinary: Negative for genital sores.  Musculoskeletal: Negative for arthralgias and joint swelling.  Skin: Positive for color change and rash. Negative for wound.  Neurological: Negative for dizziness, weakness, light-headedness and headaches.     Physical Exam Triage Vital Signs ED Triage Vitals  Enc Vitals Group     BP 04/23/21 1118 105/75     Pulse Rate 04/23/21 1117 (!) 120     Resp 04/23/21 1117 18     Temp 04/23/21 1116 99.4 F (37.4 C)     Temp Source 04/23/21 1116 Oral     SpO2 04/23/21 1118 94 %     Weight --      Height --      Head Circumference --      Peak Flow --      Pain Score 04/23/21 1117 7     Pain Loc --      Pain Edu? --      Excl. in GC? --    No  data found.  Updated Vital Signs BP 105/75   Pulse (!) 120   Temp 99.4 F (37.4 C) (Oral)   Resp 18   LMP  (LMP Unknown)   SpO2 94%   Visual Acuity Right Eye  Distance:   Left Eye Distance:   Bilateral Distance:    Right Eye Near:   Left Eye Near:    Bilateral Near:     Physical Exam Vitals and nursing note reviewed.  Constitutional:      Appearance: She is well-developed.     Comments: No acute distress  HENT:     Head: Normocephalic and atraumatic.     Nose: Nose normal.  Eyes:     Conjunctiva/sclera: Conjunctivae normal.  Cardiovascular:     Rate and Rhythm: Normal rate.  Pulmonary:     Effort: Pulmonary effort is normal. No respiratory distress.  Abdominal:     General: There is no distension.  Musculoskeletal:        General: Normal range of motion.     Cervical back: Neck supple.  Skin:    General: Skin is warm and dry.     Comments: Left lower abdominal area with large area of erythema extending from mid abdomen wrapping towards flank with associated induration and central skin peeling with out any central fluctuance  Neurological:     Mental Status: She is alert and oriented to person, place, and time.      UC Treatments / Results  Labs (all labs ordered are listed, but only abnormal results are displayed) Labs Reviewed - No data to display  EKG   Radiology No results found.  Procedures Procedures (including critical care time)  Medications Ordered in UC Medications  cefTRIAXone (ROCEPHIN) injection 1 g (1 g Intramuscular Given 04/23/21 1139)    Initial Impression / Assessment and Plan / UC Course  I have reviewed the triage vital signs and the nursing notes.  Pertinent labs & imaging results that were available during my care of the patient were reviewed by me and considered in my medical decision making (see chart for details).     Abdominal wall cellulitis-treating with Rocephin prior to discharge, initiating on doxycycline, Tylenol/ibuprofen for mild to moderate pain, hydrocodone for severe pain.  Patient to follow-up in emergency room if developing any worsening or progressive symptoms despite being  on oral antibiotics.  Discussed strict return precautions. Patient verbalized understanding and is agreeable with plan.  Final Clinical Impressions(s) / UC Diagnoses   Final diagnoses:  Abdominal wall cellulitis     Discharge Instructions     We gave you a shot of Rocephin Continue with doxycycline twice daily for 10 days Tylenol and ibuprofen for pain, fevers Hydrocodone for severe pain-use sparingly and only for severe pain, will cause drowsiness, do not drive or work after taking Please follow-up at emergency room in 48 hours if not seeing any improvement or worsening    ED Prescriptions    Medication Sig Dispense Auth. Provider   doxycycline (VIBRAMYCIN) 100 MG capsule Take 1 capsule (100 mg total) by mouth 2 (two) times daily for 10 days. 20 capsule Alegra Rost C, PA-C   ibuprofen (ADVIL) 800 MG tablet Take 1 tablet (800 mg total) by mouth 3 (three) times daily. 21 tablet Tresia Revolorio C, PA-C   HYDROcodone-acetaminophen (NORCO/VICODIN) 5-325 MG tablet Take 1-2 tablets by mouth every 6 (six) hours as needed for severe pain. 10 tablet Charlee Squibb, [place] C, PA-C     I have reviewed the  PDMP during this encounter.   Elecia Serafin, [place] C, PA-C 04/23/21 1200

## 2021-04-23 NOTE — ED Triage Notes (Signed)
Patient presents c/o possible insect bite to left lower abdomen.  Reports increasing pain and redness.   Redness noted to area, spread to side of abdomen.

## 2021-04-27 ENCOUNTER — Ambulatory Visit
Admission: EM | Admit: 2021-04-27 | Discharge: 2021-04-27 | Disposition: A | Discharge status: Home / Self Care | Payer: Medicare Other | Attending: Emergency Medicine | Admitting: Emergency Medicine

## 2021-04-27 ENCOUNTER — Other Ambulatory Visit: Payer: Self-pay

## 2021-04-27 ENCOUNTER — Encounter: Payer: Self-pay | Admitting: Emergency Medicine

## 2021-04-27 DIAGNOSIS — L03311 Cellulitis of abdominal wall: Secondary | ICD-10-CM

## 2021-04-27 MED ORDER — CEFTRIAXONE SODIUM 1 G IJ SOLR
1.0000 g | Freq: Once | INTRAMUSCULAR | Status: AC
  Administered 2021-04-27: 1 g via INTRAMUSCULAR

## 2021-04-27 MED ORDER — DOXYCYCLINE HYCLATE 100 MG PO CAPS: 100.0000 mg | ORAL_CAPSULE | Freq: Two times a day (BID) | ORAL | 0 refills | Status: AC

## 2021-04-27 NOTE — Discharge Instructions (Addendum)
We gave you an additional shot of Rocephin today Continue doxycycline twice daily and complete 14 days worth, 4 extra days sent to pharmacy Continue daily dressing changes, warm compresses Continue to monitor for redness swelling and pain to gradually improve, if symptoms progressing or worsening to follow-up

## 2021-04-27 NOTE — ED Triage Notes (Addendum)
Pt here for wound check to left abd area; pt sts seen here on 4/26 for same; pt sts taking antibiotics sts still is sore and now having some bloody drainage

## 2021-04-28 NOTE — ED Provider Notes (Signed)
[facility]    CSN: [id number] Arrival date & time: 04/27/21  1500      History   Chief Complaint Chief Complaint  Patient presents with  . Wound Check    HPI Samantha [name] is a 70 y.o. female presenting today for follow-up of cellulitis abdomen.  Patient was treated on 4/26 for left lower abdominal wall cellulitis.  Treated with Rocephin initiated on doxycycline.  Overall she felt as if the area has been improving, but here for follow-up.  She reports some pustular/bloody drainage.  Denies any fevers.  HPI  Past Medical History:  Diagnosis Date  . Allergy    seasonal  . Cataract    bilateral - MD just watching  . Cirrhosis ([place]) 2018  . Diverticulosis   . Edema   . Elevated glucose    no longer an issue 03-18-16  . Fatty liver 2017  . GERD (gastroesophageal reflux disease)   . H/O seasonal allergies   . Hepatitis C    hepatitis c completed  tx march 2017  . Hyperlipidemia 2011  . Hypertension   . LBP (low back pain)   . Liver cancer ([place]) 2017   IN REMISSION. WHEN IDAGNONED SIZE OF GRAPEFRUIT   . Morbid obesity ([place])   . Osteoarthritis    B knees  . Right middle lobe pulmonary nodule    resolved 2018  . Uterine fibroid   . Wears glasses     Patient Active Problem List   Diagnosis Date Noted  . Polyp of ascending colon   . Polyp of cecum   . Polyp of descending colon   . Polyp of sigmoid colon   . Diverticulosis of colon without hemorrhage   . Internal hemorrhoids   . MRSA cellulitis   . Abscess of skin of abdomen 08/09/2019  . Cellulitis of abdominal wall 08/05/2019  . Irregular Z line of esophagus   . Vagina bleeding 05/14/2018  . Gastritis   . Edema 04/14/2016  . 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 Hepatocellular carcinoma ([place]) 12/20/2015  . Chronic hepatitis C without hepatic coma ([place]) 12/05/2015  . Liver lesion, right lobe 12/05/2015  . Elevated LFTs 08/15/2015  . Screening for colon cancer   .  Personal history of colonic polyps   . Benign neoplasm of transverse colon   . Cystitis 08/04/2013  . Well adult exam 11/30/2012  . Fatty liver 03/23/2012  . Special screening for malignant neoplasms, colon 12/03/2011  . Benign neoplasm of colon 12/03/2011  . Diverticulosis of colon (without mention of hemorrhage) 12/03/2011  . 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 Bronchitis 11/25/2011  . Neck pain, chronic 08/22/2011  . COUGH 10/23/2010  . NONSPEC ELEVATION OF LEVELS OF TRANSAMINASE/LDH 09/23/2010  . WART, VIRAL 09/20/2010  . Hyperlipidemia 2011  . Anemia 2011  . OSTEOARTHRITIS 08/07/2009  . GERD 11/14/2008  . Low back pain 11/14/2008  . SINUSITIS, ACUTE 09/28/2008  . NEOPLASM OF UNCERTAIN BEHAVIOR OF SKIN 07/14/2008  . OBESITY, MORBID 05/04/2008  . KNEE PAIN 05/04/2008  . Hyperglycemia 05/04/2008  . VARICOSE VEINS, LOWER EXTREMITIES 04/04/2008  . Essential hypertension 03/31/2008  . MUSCLE CRAMPS 03/31/2008  . Urinary frequency 03/31/2008    Past Surgical History:  Procedure Laterality Date  . BIOPSY  07/04/2019   Procedure: BIOPSY;  Surgeon: Jerene Bears, MD;  Location: [facility];  Service: Gastroenterology;;  . BIOPSY  07/11/2020   Procedure: BIOPSY;  Surgeon: Lavena Bullion, DO;  Location: [facility];  Service: Gastroenterology;;  . Lorin Mercy    . COLONOSCOPY N/A 01/09/2015   Procedure: COLONOSCOPY;  Surgeon: Jerene Bears, MD;  Location: [facility];  Service: Gastroenterology;  Laterality: N/A;  . COLONOSCOPY WITH PROPOFOL N/A 06/06/2020   Procedure: COLONOSCOPY WITH PROPOFOL;  Surgeon: Lavena Bullion, DO;  Location: [facility];  Service: Gastroenterology;  Laterality: N/A;  . COLONOSCOPY WITH PROPOFOL N/A 07/11/2020   Procedure: COLONOSCOPY WITH PROPOFOL;  Surgeon: Lavena Bullion, DO;  [facility]: [facility];  Service: Gastroenterology;  Laterality: N/A;  . ESOPHAGOGASTRODUODENOSCOPY (EGD) WITH PROPOFOL N/A 06/10/2016   Procedure:  ESOPHAGOGASTRODUODENOSCOPY (EGD) WITH PROPOFOL;  Surgeon: Jerene Bears, MD;  [facility]: [facility];  Service: Gastroenterology;  Laterality: N/A;  . ESOPHAGOGASTRODUODENOSCOPY (EGD) WITH PROPOFOL N/A 07/04/2019   Procedure: ESOPHAGOGASTRODUODENOSCOPY (EGD) WITH PROPOFOL;  Surgeon: Jerene Bears, MD;  [facility]: [facility];  Service: Gastroenterology;  Laterality: N/A;  . HYSTEROSCOPY WITH D & C N/A 10/15/2018   Procedure: DILATATION AND CURETTAGE /HYSTEROSCOPY;  Surgeon: Aloha Gell, MD;  Location: [facility];  Service: Gynecology;  Laterality: N/A;  . IR GENERIC HISTORICAL  07/03/2016   IR RADIOLOGIST EVAL & MGMT 07/03/2016 [name], MD [facility]  . IR GENERIC HISTORICAL  11/18/2016   IR RADIOLOGIST EVAL & MGMT 11/18/2016 [name], MD [facility]  . IR RADIOLOGIST EVAL & MGMT  04/09/2017  . IR RADIOLOGIST EVAL & MGMT  11/05/2017  . IR RADIOLOGIST EVAL & MGMT  03/13/2020  . IR RADIOLOGIST EVAL & MGMT  08/30/2020  . IR RADIOLOGIST EVAL & MGMT  04/10/2021  . JOINT REPLACEMENT     BILATERAL KNEES  . left shoulder arthroplasty    . POLYPECTOMY  06/06/2020   Procedure: POLYPECTOMY;  Surgeon: Lavena Bullion, DO;  [facility]: [facility];  Service: Gastroenterology;;  . POLYPECTOMY  07/11/2020   Procedure: POLYPECTOMY;  Surgeon: Lavena Bullion, DO;  Location: [facility];  Service: Gastroenterology;;  . right knee replacement  2007  . total knee replacement L  03-13-08  . TUBAL LIGATION      OB History   No obstetric history on file.      Home Medications    Prior to Admission medications   Medication Sig Start Date End Date Taking? Authorizing Provider  doxycycline (VIBRAMYCIN) 100 MG capsule Take 1 capsule (100 mg total) by mouth 2 (two) times daily for 4 days. Please take additional 4 days after completing original 10 day course 04/27/21 05/01/21 Yes Tameria Patti C, PA-C  aspirin EC 81 MG tablet Take 81 mg by mouth daily.    [provider]  BLACK  COHOSH EXTRACT PO Take 1 tablet by mouth daily.    [provider]  Cholecalciferol 25 MCG (1000 UT) tablet Take 1,000 Units by mouth daily.     [provider]  doxycycline (VIBRAMYCIN) 100 MG capsule Take 1 capsule (100 mg total) by mouth 2 (two) times daily for 10 days. 04/23/21 05/03/21  Aspen Deterding C, PA-C  EQ LORATADINE 10 MG tablet Take 1 tablet by mouth once daily 08/14/20   Plotnikov, Evie Lacks, MD  furosemide (LASIX) 20 MG tablet Take 1-2 tablets (20-40 mg total) by mouth daily. 11/27/20   Plotnikov, Evie Lacks, MD  glucosamine-chondroitin 500-400 MG tablet Take 1 tablet by mouth daily.    [provider]  HYDROcodone-acetaminophen (NORCO/VICODIN) 5-325 MG tablet Take 1-2 tablets by mouth every 6 (six) hours as needed for severe pain. 04/23/21   Iyonna Rish, Elesa Hacker, PA-C  ibuprofen (ADVIL) 800 MG tablet Take 1 tablet (800 mg total) by mouth 3 (three) times daily. 04/23/21   Keyanah Kozicki C, PA-C  losartan (COZAAR) 100 MG tablet Take 1 tablet (100 mg total) by mouth daily. 11/27/20   Plotnikov, Georgina Quint, MD  meloxicam (MOBIC) 7.5 MG tablet TAKE 1 TO 2 TABLETS BY MOUTH ONCE DAILY AS NEEDED FOR PAIN 11/27/20   Plotnikov, Georgina Quint, MD  Multiple Vitamin (MULTIVITAMIN WITH MINERALS) TABS tablet Take 1 tablet by mouth daily.     [provider]  pantoprazole (PROTONIX) 40 MG tablet Take 1 tablet (40 mg total) by mouth daily. 11/27/20   Plotnikov, Georgina Quint, MD  potassium chloride SA (KLOR-CON) 20 MEQ tablet Take 1 tablet (20 mEq total) by mouth daily. Annual appt due in May must see provider for future refills 03/11/21   Plotnikov, Georgina Quint, MD  vitamin C (ASCORBIC ACID) 500 MG tablet Take 500 mg by mouth daily.    [provider]  promethazine (PHENERGAN) 12.5 MG tablet Take 1 tablet (12.5 mg total) by mouth every 6 (six) hours as needed for nausea or vomiting. 08/09/19 09/27/20  Etta Grandchild, MD    Family History Family History  Problem Relation  Age of Onset  . Hypertension Mother   . Ovarian cancer Mother   . Hypertension Father   . Cancer Father 39       prostate cancer   . Diabetes Father   . Diabetes Other   . Hypertension Other   . Colon cancer Neg Hx   . Esophageal cancer Neg Hx   . Rectal cancer Neg Hx   . Stomach cancer Neg Hx   . Breast cancer Neg Hx     Social History Social History   Tobacco Use  . Smoking status: Former Smoker    Packs/day: 0.25    Years: 20.00    Pack years: 5.00    Quit date: 12/30/2007    Years since quitting: 13.3  . Smokeless tobacco: Never Used  Vaping Use  . Vaping Use: Never used  Substance Use Topics  . 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 Drug use: No     Allergies   Amlodipine, Benazepril hcl, and Codeine   Review of Systems Review of Systems  Constitutional: Negative for fatigue and fever.  Eyes: Negative for visual disturbance.  Respiratory: Negative for shortness of breath.   Cardiovascular: Negative for chest pain.  Gastrointestinal: Negative for abdominal pain, nausea and vomiting.  Musculoskeletal: Negative for arthralgias and joint swelling.  Skin: Positive for color change and wound. Negative for rash.  Neurological: Negative for dizziness, weakness, light-headedness and headaches.     Physical Exam Triage Vital Signs ED Triage Vitals  Enc Vitals Group     BP 04/27/21 1522 (!) 141/67     Pulse Rate 04/27/21 1522 87     Resp 04/27/21 1522 18     Temp 04/27/21 1522 98.2 F (36.8 C)     Temp Source 04/27/21 1522 Oral     SpO2 04/27/21 1522 96 %     Weight --      Height --      Head Circumference --      Peak Flow --      Pain Score 04/27/21 1523 3     Pain Loc --      Pain Edu? --  Excl. in [place]? --    No data found.  Updated Vital Signs BP (!) 141/67 (BP Location: Right Arm)   Pulse 87   Temp 98.2 F (36.8 C) (Oral)   Resp 18   LMP  (LMP Unknown)   SpO2  96%   Visual Acuity Right Eye Distance:   Left Eye Distance:   Bilateral Distance:    Right Eye Near:   Left Eye Near:    Bilateral Near:     Physical Exam Vitals and nursing note reviewed.  Constitutional:      Appearance: She is well-developed.     Comments: No acute distress  HENT:     Head: Normocephalic and atraumatic.     Nose: Nose normal.  Eyes:     Conjunctiva/sclera: Conjunctivae normal.  Cardiovascular:     Rate and Rhythm: Normal rate.  Pulmonary:     Effort: Pulmonary effort is normal. No respiratory distress.  Abdominal:     General: There is no distension.  Musculoskeletal:        General: Normal range of motion.     Cervical back: Neck supple.  Skin:    General: Skin is warm and dry.     Comments: Left lower abdomen with erythema, induration and skin peeling noted  Neurological:     Mental Status: She is alert and oriented to person, place, and time.      UC Treatments / Results  Labs (all labs ordered are listed, but only abnormal results are displayed) Labs Reviewed - No data to display  EKG   Radiology No results found.  Procedures Procedures (including critical care time)  Medications Ordered in UC Medications  cefTRIAXone (ROCEPHIN) injection 1 g (1 g Intramuscular Given 04/27/21 1546)    Initial Impression / Assessment and Plan / UC Course  I have reviewed the triage vital signs and the nursing notes.  Pertinent labs & imaging results that were available during my care of the patient were reviewed by me and considered in my medical decision making (see chart for details).     Area to left lower abdomen does appear improved from visit on Tuesday, does not extend towards flank as much and appears to have less induration, although still large area appearing cellulitic/abscess.  We will repeat course of Rocephin and add an additional 4 days of doxycycline to have a full 2 weeks worth of oral antibiotics.  Discussed again with patient  to follow-up in emergency room if symptoms regressing.  Discussed strict return precautions. Patient verbalized understanding and is agreeable with plan.  Final Clinical Impressions(s) / UC Diagnoses   Final diagnoses:  Cellulitis, abdominal wall     Discharge Instructions     We gave you an additional shot of Rocephin today Continue doxycycline twice daily and complete 14 days worth, 4 extra days sent to pharmacy Continue daily dressing changes, warm compresses Continue to monitor for redness swelling and pain to gradually improve, if symptoms progressing or worsening to follow-up   ED Prescriptions    Medication Sig Dispense Auth. Provider   doxycycline (VIBRAMYCIN) 100 MG capsule Take 1 capsule (100 mg total) by mouth 2 (two) times daily for 4 days. Please take additional 4 days after completing original 10 day course 8 capsule Timber Marshman C, PA-C     PDMP not reviewed this encounter.   Janith Lima, [place] 04/28/21 [phone]

## 2021-05-08 ENCOUNTER — Other Ambulatory Visit: Payer: Self-pay

## 2021-05-08 ENCOUNTER — Ambulatory Visit
Admission: EM | Admit: 2021-05-08 | Discharge: 2021-05-08 | Disposition: A | Discharge status: Home / Self Care | Payer: Medicare Other | Attending: Family Medicine | Admitting: Family Medicine

## 2021-05-08 DIAGNOSIS — Z8614 Personal history of Methicillin resistant Staphylococcus aureus infection: Secondary | ICD-10-CM

## 2021-05-08 DIAGNOSIS — L03311 Cellulitis of abdominal wall: Secondary | ICD-10-CM | POA: Diagnosis not present

## 2021-05-08 DIAGNOSIS — T148XXD Other injury of unspecified body region, subsequent encounter: Secondary | ICD-10-CM

## 2021-05-08 MED ORDER — SULFAMETHOXAZOLE-TRIMETHOPRIM 800-160 MG PO TABS: 1.0000 | ORAL_TABLET | Freq: Two times a day (BID) | ORAL | 0 refills | Status: DC

## 2021-05-08 NOTE — Discharge Instructions (Signed)
I have referred you emergently to regional infectious disease for treating of non healed abdominal wall absence with cellulitis infection .  If any of your symptoms worsen, or you develop fever, or persistent abdominal pain go immediately to the emergency department.

## 2021-05-08 NOTE — ED Provider Notes (Signed)
[facility]    CSN: [id number] Arrival date & time: 05/08/21  1524      History   Chief Complaint Chief Complaint  Patient presents with  . wound care    HPI Samantha [name] is a 70 y.o. female.   HPI  Left abdominal wall cellulitis infection.  Patient was seen at the ED on 04/20/2021 for an abdominal wall abscess.  She has subsequently been seen here in urgent care as of today 3 times.  She is been treated with multiple antibiotics without resolution of symptoms.  She continues to have drainage and tenderness at the left lower abdominal wall.  She is afebrile but does endorses persistent drainage.  She has a history of MRSA.   Past Medical History:  Diagnosis Date  . Allergy    seasonal  . Cataract    bilateral - MD just watching  . Cirrhosis ([place]) 2018  . Diverticulosis   . Edema   . Elevated glucose    no longer an issue 03-18-16  . Fatty liver 2017  . GERD (gastroesophageal reflux disease)   . H/O seasonal allergies   . Hepatitis C    hepatitis c completed  tx march 2017  . Hyperlipidemia 2011  . Hypertension   . LBP (low back pain)   . Liver cancer ([place]) 2017   IN REMISSION. WHEN IDAGNONED SIZE OF GRAPEFRUIT   . Morbid obesity ([place])   . Osteoarthritis    B knees  . Right middle lobe pulmonary nodule    resolved 2018  . Uterine fibroid   . Wears glasses     Patient Active Problem List   Diagnosis Date Noted  . Polyp of ascending colon   . Polyp of cecum   . Polyp of descending colon   . Polyp of sigmoid colon   . Diverticulosis of colon without hemorrhage   . Internal hemorrhoids   . MRSA cellulitis   . Abscess of skin of abdomen 08/09/2019  . Cellulitis of abdominal wall 08/05/2019  . Irregular Z line of esophagus   . Vagina bleeding 05/14/2018  . Gastritis   . Edema 04/14/2016  . Liver cirrhosis secondary to NASH ([place]) 03/03/2016  . Hepatocellular carcinoma ([place]) 12/20/2015  . Chronic hepatitis C without hepatic coma ([place])  12/05/2015  . Liver lesion, right lobe 12/05/2015  . Elevated LFTs 08/15/2015  . Screening for colon cancer   . Personal history of colonic polyps   . Benign neoplasm of transverse colon   . Cystitis 08/04/2013  . Well adult exam 11/30/2012  . Fatty liver 03/23/2012  . Special screening for malignant neoplasms, colon 12/03/2011  . Benign neoplasm of colon 12/03/2011  . Diverticulosis of colon (without mention of hemorrhage) 12/03/2011  . 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 Bronchitis 11/25/2011  . Neck pain, chronic 08/22/2011  . COUGH 10/23/2010  . NONSPEC ELEVATION OF LEVELS OF TRANSAMINASE/LDH 09/23/2010  . WART, VIRAL 09/20/2010  . Hyperlipidemia 2011  . Anemia 2011  . OSTEOARTHRITIS 08/07/2009  . GERD 11/14/2008  . Low back pain 11/14/2008  . SINUSITIS, ACUTE 09/28/2008  . NEOPLASM OF UNCERTAIN BEHAVIOR OF SKIN 07/14/2008  . OBESITY, MORBID 05/04/2008  . KNEE PAIN 05/04/2008  . Hyperglycemia 05/04/2008  . VARICOSE VEINS, LOWER EXTREMITIES 04/04/2008  . Essential hypertension 03/31/2008  . MUSCLE CRAMPS 03/31/2008  . Urinary frequency 03/31/2008    Past Surgical History:  Procedure Laterality Date  . BIOPSY  07/04/2019   Procedure: BIOPSY;  Surgeon: Zenovia Jarred  Jerilynn Mages, MD;  Location: [facility];  Service: Gastroenterology;;  . BIOPSY  07/11/2020   Procedure: BIOPSY;  Surgeon: Lavena Bullion, DO;  Location: [facility];  Service: Gastroenterology;;  . CHOLECYSTECTOMY    . COLONOSCOPY N/A 01/09/2015   Procedure: COLONOSCOPY;  Surgeon: Jerene Bears, MD;  Location: [facility];  Service: Gastroenterology;  Laterality: N/A;  . COLONOSCOPY WITH PROPOFOL N/A 06/06/2020   Procedure: COLONOSCOPY WITH PROPOFOL;  Surgeon: Lavena Bullion, DO;  [facility];  Service: Gastroenterology;  Laterality: N/A;  . COLONOSCOPY WITH PROPOFOL N/A 07/11/2020   Procedure: COLONOSCOPY WITH PROPOFOL;  Surgeon: Lavena Bullion, DO;  [facility];  Service: Gastroenterology;   Laterality: N/A;  . ESOPHAGOGASTRODUODENOSCOPY (EGD) WITH PROPOFOL N/A 06/10/2016   Procedure: ESOPHAGOGASTRODUODENOSCOPY (EGD) WITH PROPOFOL;  Surgeon: Jerene Bears, MD;  [facility];  Service: Gastroenterology;  Laterality: N/A;  . ESOPHAGOGASTRODUODENOSCOPY (EGD) WITH PROPOFOL N/A 07/04/2019   Procedure: ESOPHAGOGASTRODUODENOSCOPY (EGD) WITH PROPOFOL;  Surgeon: Jerene Bears, MD;  Location: [facility];  Service: Gastroenterology;  Laterality: N/A;  . HYSTEROSCOPY WITH D & C N/A 10/15/2018   Procedure: DILATATION AND CURETTAGE /HYSTEROSCOPY;  Surgeon: Aloha Gell, MD;  Location: [facility];  Service: Gynecology;  Laterality: N/A;  . IR GENERIC HISTORICAL  07/03/2016   IR RADIOLOGIST EVAL & MGMT 07/03/2016 [name], MD [facility]  . IR GENERIC HISTORICAL  11/18/2016   IR RADIOLOGIST EVAL & MGMT 11/18/2016 [name], MD [facility]  . IR RADIOLOGIST EVAL & MGMT  04/09/2017  . IR RADIOLOGIST EVAL & MGMT  11/05/2017  . IR RADIOLOGIST EVAL & MGMT  03/13/2020  . IR RADIOLOGIST EVAL & MGMT  08/30/2020  . IR RADIOLOGIST EVAL & MGMT  04/10/2021  . JOINT REPLACEMENT     BILATERAL KNEES  . left shoulder arthroplasty    . POLYPECTOMY  06/06/2020   Procedure: POLYPECTOMY;  Surgeon: Lavena Bullion, DO;  Location: [facility];  Service: Gastroenterology;;  . POLYPECTOMY  07/11/2020   Procedure: POLYPECTOMY;  Surgeon: Lavena Bullion, DO;  Location: [facility];  Service: Gastroenterology;;  . right knee replacement  2007  . total knee replacement L  03-13-08  . TUBAL LIGATION      OB History   No obstetric history on file.      Home Medications    Prior to Admission medications   Medication Sig Start Date End Date Taking? Authorizing Provider  aspirin EC 81 MG tablet Take 81 mg by mouth daily.    [provider]  BLACK COHOSH EXTRACT PO Take 1 tablet by mouth daily.    [provider]  Cholecalciferol 25 MCG (1000 UT) tablet Take 1,000  Units by mouth daily.     [provider]  EQ LORATADINE 10 MG tablet Take 1 tablet by mouth once daily 08/14/20   Plotnikov, Evie Lacks, MD  furosemide (LASIX) 20 MG tablet Take 1-2 tablets (20-40 mg total) by mouth daily. 11/27/20   Plotnikov, Evie Lacks, MD  glucosamine-chondroitin 500-400 MG tablet Take 1 tablet by mouth daily.    [provider]  HYDROcodone-acetaminophen (NORCO/VICODIN) 5-325 MG tablet Take 1-2 tablets by mouth every 6 (six) hours as needed for severe pain. 04/23/21   Wieters, Hallie C, PA-C  ibuprofen (ADVIL) 800 MG tablet Take 1 tablet (800 mg total) by mouth 3 (three) times daily. 04/23/21   Wieters, Hallie C, PA-C  losartan (COZAAR) 100 MG tablet Take 1 tablet (100 mg total) by mouth  daily. 11/27/20   Plotnikov, Evie Lacks, MD  meloxicam (MOBIC) 7.5 MG tablet TAKE 1 TO 2 TABLETS BY MOUTH ONCE DAILY AS NEEDED FOR PAIN 11/27/20   Plotnikov, Evie Lacks, MD  Multiple Vitamin (MULTIVITAMIN WITH MINERALS) TABS tablet Take 1 tablet by mouth daily.     [provider]  pantoprazole (PROTONIX) 40 MG tablet Take 1 tablet (40 mg total) by mouth daily. 11/27/20   Plotnikov, Evie Lacks, MD  potassium chloride SA (KLOR-CON) 20 MEQ tablet Take 1 tablet (20 mEq total) by mouth daily. Annual appt due in May must see provider for future refills 03/11/21   Plotnikov, Evie Lacks, MD  vitamin C (ASCORBIC ACID) 500 MG tablet Take 500 mg by mouth daily.    [provider]  promethazine (PHENERGAN) 12.5 MG tablet Take 1 tablet (12.5 mg total) by mouth every 6 (six) hours as needed for nausea or vomiting. 08/09/19 09/27/20  Janith Lima, MD    Family History Family History  Problem Relation Age of Onset  . Hypertension Mother   . Ovarian cancer Mother   . Hypertension Father   . Cancer Father 87       prostate cancer   . Diabetes Father   . Diabetes Other   . Hypertension Other   . Colon cancer Neg Hx   . Esophageal cancer Neg Hx   . Rectal cancer Neg Hx   .  Stomach cancer Neg Hx   . Breast cancer Neg Hx     Social History Social History   Tobacco Use  . Smoking status: Former Smoker    Packs/day: 0.25    Years: 20.00    Pack years: 5.00    Quit date: 12/30/2007    Years since quitting: 13.3  . Smokeless tobacco: Never Used  Vaping Use  . Vaping Use: Never used  Substance Use Topics  . 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 Drug use: No     Allergies   Amlodipine, Benazepril hcl, and Codeine   Review of Systems Review of Systems Pertinent negatives listed in HPI   Physical Exam Triage Vital Signs ED Triage Vitals [05/08/21 1736]  Enc Vitals Group     BP (!) 168/104     Pulse Rate 92     Resp 18     Temp 98.1 F (36.7 C)     Temp Source Oral     SpO2 96 %     Weight      Height      Head Circumference      Peak Flow      Pain Score 0     Pain Loc      Pain Edu?      Excl. in [place]?    No data found.  Updated Vital Signs BP (!) 168/104 (BP Location: Left Arm)   Pulse 92   Temp 98.1 F (36.7 C) (Oral)   Resp 18   LMP  (LMP Unknown)   SpO2 96%   Visual Acuity Right Eye Distance:   Left Eye Distance:   Bilateral Distance:    Right Eye Near:   Left Eye Near:    Bilateral Near:     Physical Exam  General appearance: Alert, Obesity, Cooperative and  no distress Head: Normocephalic, without obvious abnormality, atraumatic Respiratory: Respirations even and unlabored, normal respiratory rate Heart: Rate and rhythm normal. No  gallop or murmurs noted on exam  Abdomen: BS +, no distention, no rebound tenderness, or no mass Extremities: No gross deformities Skin: Left abdominal wall with induration, erythema and pustular drainage, tenderness with palpation  Psych: Appropriate mood and affect. Neurologic: GCS 15, normal coordination, normal gait      UC Treatments / Results  Labs (all labs ordered are listed, but only  abnormal results are displayed) Labs Reviewed - No data to display  EKG   Radiology No results found.  Procedures Procedures (including critical care time)  Medications Ordered in UC Medications - No data to display  Initial Impression / Assessment and Plan / UC Course  I have reviewed the triage vital signs and the nursing notes.  Pertinent labs & imaging results that were available during my care of the patient were reviewed by me and considered in my medical decision making (see chart for details).     Cellulitis infection of left abdominal wall.  I have referred patient emergently to infectious disease as she has been treated with over 3 weeks of doxycycline without resolution of infection. Patient has been advised to go to the ER if any of her symptoms worsens she has been seen here at urgent care twice and during her last encounter was advised to go to the ER and she subsequently returned today.  She has been advised that she has been emergently evaluated to infectious disease and if any of her symptoms worsens neck step would be ER.  Final Clinical Impressions(s) / UC Diagnoses   Final diagnoses:  Cellulitis of left abdominal wall  History of MRSA infection  Delayed wound healing     Discharge Instructions     I have referred you emergently to regional infectious disease for treating of non healed abdominal wall absence with cellulitis infection .  If any of your symptoms worsen, or you develop fever, or persistent abdominal pain go immediately to the emergency department.     ED Prescriptions    None     PDMP not reviewed this encounter.   Scot Jun, FNP 05/08/21 1831

## 2021-05-08 NOTE — ED Triage Notes (Signed)
Pt states had a insect bite to LLQ about 3 weeks ago that has now healed but has an open area from the bandage. Draining abscess noted to LLQ.

## 2021-05-10 ENCOUNTER — Ambulatory Visit: Payer: Medicare Other | Admitting: Infectious Disease

## 2021-05-10 ENCOUNTER — Other Ambulatory Visit: Payer: Self-pay

## 2021-05-10 ENCOUNTER — Other Ambulatory Visit (HOSPITAL_COMMUNITY): Payer: Self-pay

## 2021-05-10 VITALS — BP 116/82 | HR 96 | Temp 98.1°F | Wt 293.0 lb

## 2021-05-10 DIAGNOSIS — L02211 Cutaneous abscess of abdominal wall: Secondary | ICD-10-CM | POA: Diagnosis not present

## 2021-05-10 DIAGNOSIS — B182 Chronic viral hepatitis C: Secondary | ICD-10-CM | POA: Diagnosis not present

## 2021-05-10 DIAGNOSIS — B9562 Methicillin resistant Staphylococcus aureus infection as the cause of diseases classified elsewhere: Secondary | ICD-10-CM

## 2021-05-10 DIAGNOSIS — L0390 Cellulitis, unspecified: Secondary | ICD-10-CM | POA: Diagnosis not present

## 2021-05-10 DIAGNOSIS — M793 Panniculitis, unspecified: Secondary | ICD-10-CM | POA: Diagnosis not present

## 2021-05-10 DIAGNOSIS — E669 Obesity, unspecified: Secondary | ICD-10-CM | POA: Diagnosis not present

## 2021-05-10 MED ORDER — LINEZOLID 600 MG PO TABS
600.0000 mg | ORAL_TABLET | Freq: Two times a day (BID) | ORAL | 1 refills | Status: DC
  Filled 2021-05-10: qty 14, 7d supply, fill #0

## 2021-05-10 NOTE — Progress Notes (Signed)
Reason for Infectious Disease Consult: Soft tissue abscess likely due to MRSA  Requesting Physician: Molli Barrows, NP  Subjective:    Patient ID: Samantha [name], female    DOB: 1951-06-09, 70 y.o.   MRN: [id number]  HPI  Samantha [name] is a 70 year old black woman with morbid obesity who previously seen my partner Dr. Linus Salmons and was treated for hepatitis C.  He also diagnosed her with hepatocellular carcinoma which was treated with radiofrequency ablation.  She has been suffering with soft tissue infection involving pannus involving her lower abdomen on the left side.  She had been at a picnic with friends and family when she felt an intense pain in her left abdomen and discovered an insect bite.  Erythema worsened  Went to the emergency department initially on 23 April and was given topical antimicrobials.  She went back again on the 26th and was given a shot of ceftriaxone as well as doxycycline.  Initial had improvement but then she came back to the ER again on the 30th with drainage from a new area was given ceftriaxone and doxycycline again.  Open area more laterally that is been the most prominent area of drainage rather than the area of the initial "insect bite" she was seen again in the ER on the 11th and prescribed Bactrim.  MRSA that was isolated from a culture roughly a year ago was resistant to Bactrim so I think this would not be the right antibiotic if she has the same bacteria responsible here.  She is fearful that she might end up being hospitalized due to the nature of this infection and apparently that happened in the past.    Past Medical History:  Diagnosis Date  . Allergy    seasonal  . Cataract    bilateral - MD just watching  . Cirrhosis ([place]) 2018  . Diverticulosis   . Edema   . Elevated glucose    no longer an issue 03-18-16  . Fatty liver 2017  . GERD (gastroesophageal reflux disease)   . H/O seasonal allergies   . Hepatitis C    hepatitis c completed   tx march 2017  . Hyperlipidemia 2011  . Hypertension   . LBP (low back pain)   . Liver cancer ([place]) 2017   IN REMISSION. WHEN IDAGNONED SIZE OF GRAPEFRUIT   . Morbid obesity ([place])   . Osteoarthritis    B knees  . Right middle lobe pulmonary nodule    resolved 2018  . Uterine fibroid   . Wears glasses     Past Surgical History:  Procedure Laterality Date  . BIOPSY  07/04/2019   Procedure: BIOPSY;  Surgeon: Jerene Bears, MD;  Location: [facility];  Service: Gastroenterology;;  . BIOPSY  07/11/2020   Procedure: BIOPSY;  Surgeon: Lavena Bullion, DO;  Location: [facility];  Service: Gastroenterology;;  . CHOLECYSTECTOMY    . COLONOSCOPY N/A 01/09/2015   Procedure: COLONOSCOPY;  Surgeon: Jerene Bears, MD;  Location: [facility];  Service: Gastroenterology;  Laterality: N/A;  . COLONOSCOPY WITH PROPOFOL N/A 06/06/2020   Procedure: COLONOSCOPY WITH PROPOFOL;  Surgeon: Lavena Bullion, DO;  Location: [facility];  Service: Gastroenterology;  Laterality: N/A;  . COLONOSCOPY WITH PROPOFOL N/A 07/11/2020   Procedure: COLONOSCOPY WITH PROPOFOL;  Surgeon: Lavena Bullion, DO;  Location: [facility];  Service: Gastroenterology;  Laterality: N/A;  . ESOPHAGOGASTRODUODENOSCOPY (EGD) WITH PROPOFOL N/A 06/10/2016   Procedure: ESOPHAGOGASTRODUODENOSCOPY (EGD) WITH PROPOFOL;  Surgeon: Jerene Bears, MD;  Location: [facility];  Service: Gastroenterology;  Laterality: N/A;  . ESOPHAGOGASTRODUODENOSCOPY (EGD) WITH PROPOFOL N/A 07/04/2019   Procedure: ESOPHAGOGASTRODUODENOSCOPY (EGD) WITH PROPOFOL;  Surgeon: Jerene Bears, MD;  Location: [facility];  Service: Gastroenterology;  Laterality: N/A;  . HYSTEROSCOPY WITH D & C N/A 10/15/2018   Procedure: DILATATION AND CURETTAGE /HYSTEROSCOPY;  Surgeon: Aloha Gell, MD;  Location: [facility];  Service: Gynecology;  Laterality: N/A;  . IR GENERIC HISTORICAL  07/03/2016   IR RADIOLOGIST EVAL & MGMT 07/03/2016 [name], MD [facility]  . IR  GENERIC HISTORICAL  11/18/2016   IR RADIOLOGIST EVAL & MGMT 11/18/2016 [name], MD [facility]  . IR RADIOLOGIST EVAL & MGMT  04/09/2017  . IR RADIOLOGIST EVAL & MGMT  11/05/2017  . IR RADIOLOGIST EVAL & MGMT  03/13/2020  . IR RADIOLOGIST EVAL & MGMT  08/30/2020  . IR RADIOLOGIST EVAL & MGMT  04/10/2021  . JOINT REPLACEMENT     BILATERAL KNEES  . left shoulder arthroplasty    . POLYPECTOMY  06/06/2020   Procedure: POLYPECTOMY;  Surgeon: Lavena Bullion, DO;  Location: [facility];  Service: Gastroenterology;;  . POLYPECTOMY  07/11/2020   Procedure: POLYPECTOMY;  Surgeon: Lavena Bullion, DO;  Location: [facility];  Service: Gastroenterology;;  . right knee replacement  2007  . total knee replacement L  03-13-08  . TUBAL LIGATION      Family History  Problem Relation Age of Onset  . Hypertension Mother   . Ovarian cancer Mother   . Hypertension Father   . Cancer Father 2       prostate cancer   . Diabetes Father   . Diabetes Other   . Hypertension Other   . Colon cancer Neg Hx   . Esophageal cancer Neg Hx   . Rectal cancer Neg Hx   . Stomach cancer Neg Hx   . Breast cancer Neg Hx       Social History   Socioeconomic History  . Marital status: Single    Spouse name: Not on file  . Number of children: 4  . Years of education: Not on file  . Highest education level: Not on file  Occupational History  . Not on file  Tobacco Use  . Smoking status: Former Smoker    Packs/day: 0.25    Years: 20.00    Pack years: 5.00    Quit date: 12/30/2007    Years since quitting: 13.3  . Smokeless tobacco: Never Used  Vaping Use  . Vaping Use: Never used  Substance and Sexual Activity  . 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 Drug use: No  . Sexual activity: Not Currently    Birth control/protection: Post-menopausal  Other Topics Concern  . 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 Difficulty of Paying Living Expenses: Not hard at all  Food Insecurity: No Food Insecurity  . Worried About Charity fundraiser in the Last Year: Never true  . Ran Out of  Food in the Last Year: Never true  Transportation Needs: No Transportation Needs  . Lack of Transportation (Medical): No  . Lack of Transportation (Non-Medical): No  Physical Activity: Sufficiently Active  . Days of Exercise per Week: 5 days  . Minutes of Exercise per Session: 30 min  Stress: No Stress Concern Present  . Feeling of Stress : Not at all  Social Connections: Socially Isolated  . Frequency of Communication with Friends and Family: More than three times a week  . Frequency of Social Gatherings with Friends and Family: More than three times a week  . Attends Religious Services: Never  . Active Member of Clubs or Organizations: No  . Attends Archivist Meetings: Never  . Marital Status: Divorced    Allergies  Allergen Reactions  . Amlodipine Swelling and Other (See Comments)    Edema whole body, especially in feet  . Benazepril Hcl Cough  . Codeine Itching     Current Outpatient Medications:  .  aspirin EC 81 MG tablet, Take 81 mg by mouth daily., Disp: , Rfl:  .  BLACK COHOSH EXTRACT PO, Take 1 tablet by mouth daily., Disp: , Rfl:  .  Cholecalciferol 25 MCG (1000 UT) tablet, Take 1,000 Units by mouth daily. , Disp: , Rfl:  .  EQ LORATADINE 10 MG tablet, Take 1 tablet by mouth once daily, Disp: 90 tablet, Rfl: 3 .  furosemide (LASIX) 20 MG tablet, Take 1-2 tablets (20-40 mg total) by mouth daily., Disp: 180 tablet, Rfl: 3 .  glucosamine-chondroitin 500-400  MG tablet, Take 1 tablet by mouth daily., Disp: , Rfl:  .  HYDROcodone-acetaminophen (NORCO/VICODIN) 5-325 MG tablet, Take 1-2 tablets by mouth every 6 (six) hours as needed for severe pain., Disp: 10 tablet, Rfl: 0 .  ibuprofen (ADVIL) 800 MG tablet, Take 1 tablet (800 mg total) by mouth 3 (three) times daily., Disp: 21 tablet, Rfl: 0 .  losartan (COZAAR) 100 MG tablet, Take 1 tablet (100 mg total) by mouth daily., Disp: 90 tablet, Rfl: 3 .  meloxicam (MOBIC) 7.5 MG tablet, TAKE 1 TO 2 TABLETS BY MOUTH ONCE DAILY AS NEEDED FOR PAIN, Disp: 60 tablet, Rfl: 3 .  Multiple Vitamin (MULTIVITAMIN WITH MINERALS) TABS tablet, Take 1 tablet by mouth daily. , Disp: , Rfl:  .  pantoprazole (PROTONIX) 40 MG tablet, Take 1 tablet (40 mg total) by mouth daily., Disp: 90 tablet, Rfl: 3 .  potassium chloride SA (KLOR-CON) 20 MEQ tablet, Take 1 tablet (20 mEq total) by mouth daily. Annual appt due in May must see provider for future refills, Disp: 90 tablet, Rfl: 0 .  sulfamethoxazole-trimethoprim (BACTRIM DS) 800-160 MG tablet, Take 1 tablet by mouth 2 (two) times daily., Disp: 20 tablet, Rfl: 0 .  vitamin C (ASCORBIC ACID) 500 MG tablet, Take 500 mg by mouth daily., Disp: , Rfl:    Review of Systems  Constitutional: Negative for activity change, appetite change, chills, diaphoresis, fatigue, fever and unexpected weight change.  HENT: Negative for congestion, rhinorrhea, sinus pressure, sneezing, sore throat and trouble swallowing.   Eyes: Negative for photophobia and visual disturbance.  Respiratory: Negative for cough, chest tightness, shortness of breath, wheezing and stridor.   Cardiovascular: Negative for chest pain, palpitations and leg swelling.  Gastrointestinal: Negative for abdominal distention, abdominal pain, anal bleeding, blood in stool, constipation, diarrhea, nausea and vomiting.  Genitourinary: Negative for difficulty urinating, dysuria, flank pain and hematuria.  Musculoskeletal: Negative for  arthralgias, back pain, gait problem, joint swelling  and myalgias.  Skin: Positive for color change and wound. Negative for pallor and rash.  Neurological: Negative for dizziness, tremors, weakness and light-headedness.  Hematological: Negative for adenopathy. Does not bruise/bleed easily.  Psychiatric/Behavioral: Negative for agitation, behavioral problems, confusion, decreased concentration, dysphoric mood and sleep disturbance.       Objective:   Physical Exam Constitutional:      General: She is not in acute distress.    Appearance: Normal appearance. She is well-developed. She is obese. She is not ill-appearing or diaphoretic.  HENT:     Head: Normocephalic and atraumatic.     Right Ear: Hearing and external ear normal.     Left Ear: Hearing and external ear normal.     Nose: No nasal deformity or rhinorrhea.  Eyes:     General: No scleral icterus.    Conjunctiva/sclera: Conjunctivae normal.     Right eye: Right conjunctiva is not injected.     Left eye: Left conjunctiva is not injected.     Pupils: Pupils are equal, round, and reactive to light.  Neck:     Vascular: No JVD.  Cardiovascular:     Rate and Rhythm: Normal rate and regular rhythm.     Heart sounds: S1 normal and S2 normal. Murmur heard.  No friction rub.  Pulmonary:     Effort: Pulmonary effort is normal. No respiratory distress.  Abdominal:     General: Bowel sounds are normal. There is no distension.     Palpations: Abdomen is soft. There is no mass.     Tenderness: There is no abdominal tenderness.  Musculoskeletal:        General: Normal range of motion.     Right shoulder: Normal.     Left shoulder: Normal.     Cervical back: Normal range of motion and neck supple.     Right hip: Normal.     Left hip: Normal.     Right knee: Normal.     Left knee: Normal.  Lymphadenopathy:     Head:     Right side of head: No submandibular, preauricular or posterior auricular adenopathy.     Left side of head: No  submandibular, preauricular or posterior auricular adenopathy.     Cervical: No cervical adenopathy.     Right cervical: No superficial or deep cervical adenopathy.    Left cervical: No superficial or deep cervical adenopathy.  Skin:    General: Skin is warm and dry.     Coloration: Skin is not pale.     Findings: No abrasion, bruising, ecchymosis, erythema, lesion or rash.     Nails: There is no clubbing.  Neurological:     General: No focal deficit present.     Mental Status: She is alert and oriented to person, place, and time.     Sensory: No sensory deficit.     Coordination: Coordination normal.     Gait: Gait normal.  Psychiatric:        Attention and Perception: She is attentive.        Mood and Affect: Mood normal.        Speech: Speech normal.        Behavior: Behavior normal. Behavior is cooperative.        Thought Content: Thought content normal.        Judgment: Judgment normal.    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  Obesity: issue with panni is making areas where infection can fluorish. 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  She is not able to get into a bathtub due to her knee replacements and limited mobility but she could do showers with Hibiclens and intranasal mupirocin.  I spent greater than 80 minutes with the patient including greater than 50% of time in face to face counsel of the patient and her pertinent laboratory data radiographic data and in coordination of her care.

## 2021-05-17 ENCOUNTER — Other Ambulatory Visit: Payer: Self-pay

## 2021-05-17 ENCOUNTER — Ambulatory Visit (INDEPENDENT_AMBULATORY_CARE_PROVIDER_SITE_OTHER): Payer: Medicare Other | Admitting: Infectious Disease

## 2021-05-17 ENCOUNTER — Other Ambulatory Visit (HOSPITAL_COMMUNITY): Payer: Self-pay

## 2021-05-17 VITALS — BP 124/84 | HR 98 | Temp 97.7°F | Wt 289.0 lb

## 2021-05-17 DIAGNOSIS — L0390 Cellulitis, unspecified: Secondary | ICD-10-CM | POA: Diagnosis not present

## 2021-05-17 DIAGNOSIS — B9562 Methicillin resistant Staphylococcus aureus infection as the cause of diseases classified elsewhere: Secondary | ICD-10-CM | POA: Diagnosis not present

## 2021-05-17 DIAGNOSIS — M793 Panniculitis, unspecified: Secondary | ICD-10-CM | POA: Diagnosis not present

## 2021-05-17 DIAGNOSIS — B182 Chronic viral hepatitis C: Secondary | ICD-10-CM | POA: Diagnosis not present

## 2021-05-17 DIAGNOSIS — L02211 Cutaneous abscess of abdominal wall: Secondary | ICD-10-CM | POA: Diagnosis not present

## 2021-05-17 DIAGNOSIS — Z6841 Body Mass Index (BMI) 40.0 and over, adult: Secondary | ICD-10-CM

## 2021-05-17 MED ORDER — LINEZOLID 600 MG PO TABS
600.0000 mg | ORAL_TABLET | Freq: Two times a day (BID) | ORAL | 0 refills | Status: DC
Start: 2021-05-17 — End: 2021-05-20
  Filled 2021-05-17: qty 21, 11d supply, fill #0

## 2021-05-17 NOTE — Progress Notes (Signed)
Chief complaint: Still having some areas of induration around her abdomen despite being on that she thinks there decreasing in size.  Subjective:    Patient ID: Samantha [name], female    DOB: 31-May-1951, 70 y.o.   MRN: [id number]  HPI  Samantha [name] is a 70 year old black woman with morbid obesity who previously seen my partner Dr. Linus Salmons and was treated for hepatitis C.  He also diagnosed her with hepatocellular carcinoma which was treated with radiofrequency ablation.  She has been suffering with soft tissue infection involving pannus involving her lower abdomen on the left side.  She had been at a picnic with friends and family when she felt an intense pain in her left abdomen and discovered an insect bite.  Erythema worsened  Went to the emergency department initially on 23 April and was given topical antimicrobials.  She went back again on the 26th and was given a shot of ceftriaxone as well as doxycycline.  Initial had improvement but then she came back to the ER again on the 30th with drainage from a new area was given ceftriaxone and doxycycline again.  Open area more laterally that is been the most prominent area of drainage rather than the area of the initial "insect bite" she was seen again in the ER on the 11th and prescribed Bactrim.  MRSA that was isolated from a culture roughly a year ago was resistant to Bactrim so I think this would not be the right antibiotic if she has the same bacteria responsible here.  She was fearful that she might end up being hospitalized due to the nature of this infection and apparently that happened in the past.  I prescribed her Zyvox she claims they are only for 10 pills and she is already run out of them.  She says that some of the areas in her abdomen have diminished in size while on the Zyvox but there is still some that remain.  Had a scant skin tear where she had a bandaged removed by our staff in the last week.  Past Medical History:   Diagnosis Date  . Allergy    seasonal  . Cataract    bilateral - MD just watching  . Cirrhosis ([place]) 2018  . Diverticulosis   . Edema   . Elevated glucose    no longer an issue 03-18-16  . Fatty liver 2017  . GERD (gastroesophageal reflux disease)   . H/O seasonal allergies   . Hepatitis C    hepatitis c completed  tx march 2017  . Hyperlipidemia 2011  . Hypertension   . LBP (low back pain)   . Liver cancer ([place]) 2017   IN REMISSION. WHEN IDAGNONED SIZE OF GRAPEFRUIT   . Morbid obesity ([place])   . Osteoarthritis    B knees  . Right middle lobe pulmonary nodule    resolved 2018  . Uterine fibroid   . Wears glasses     Past Surgical History:  Procedure Laterality Date  . BIOPSY  07/04/2019   Procedure: BIOPSY;  Surgeon: Jerene Bears, MD;  Location: [facility];  Service: Gastroenterology;;  . BIOPSY  07/11/2020   Procedure: BIOPSY;  Surgeon: Lavena Bullion, DO;  Location: [facility];  Service: Gastroenterology;;  . CHOLECYSTECTOMY    . COLONOSCOPY N/A 01/09/2015   Procedure: COLONOSCOPY;  Surgeon: Jerene Bears, MD;  Location: [facility];  Service: Gastroenterology;  Laterality: N/A;  . COLONOSCOPY WITH PROPOFOL N/A 06/06/2020   Procedure: COLONOSCOPY WITH  PROPOFOL;  Surgeon: Lavena Bullion, DO;  Location: [facility];  Service: Gastroenterology;  Laterality: N/A;  . COLONOSCOPY WITH PROPOFOL N/A 07/11/2020   Procedure: COLONOSCOPY WITH PROPOFOL;  Surgeon: Lavena Bullion, DO;  Location: [facility];  Service: Gastroenterology;  Laterality: N/A;  . ESOPHAGOGASTRODUODENOSCOPY (EGD) WITH PROPOFOL N/A 06/10/2016   Procedure: ESOPHAGOGASTRODUODENOSCOPY (EGD) WITH PROPOFOL;  Surgeon: Jerene Bears, MD;  Location: [facility];  Service: Gastroenterology;  Laterality: N/A;  . ESOPHAGOGASTRODUODENOSCOPY (EGD) WITH PROPOFOL N/A 07/04/2019   Procedure: ESOPHAGOGASTRODUODENOSCOPY (EGD) WITH PROPOFOL;  Surgeon: Jerene Bears, MD;  Location: [facility];  Service:  Gastroenterology;  Laterality: N/A;  . HYSTEROSCOPY WITH D & C N/A 10/15/2018   Procedure: DILATATION AND CURETTAGE /HYSTEROSCOPY;  Surgeon: Aloha Gell, MD;  Location: [facility];  Service: Gynecology;  Laterality: N/A;  . IR GENERIC HISTORICAL  07/03/2016   IR RADIOLOGIST EVAL & MGMT 07/03/2016 [name], MD [facility]  . IR GENERIC HISTORICAL  11/18/2016   IR RADIOLOGIST EVAL & MGMT 11/18/2016 [name], MD [facility]  . IR RADIOLOGIST EVAL & MGMT  04/09/2017  . IR RADIOLOGIST EVAL & MGMT  11/05/2017  . IR RADIOLOGIST EVAL & MGMT  03/13/2020  . IR RADIOLOGIST EVAL & MGMT  08/30/2020  . IR RADIOLOGIST EVAL & MGMT  04/10/2021  . JOINT REPLACEMENT     BILATERAL KNEES  . left shoulder arthroplasty    . POLYPECTOMY  06/06/2020   Procedure: POLYPECTOMY;  Surgeon: Lavena Bullion, DO;  Location: [facility];  Service: Gastroenterology;;  . POLYPECTOMY  07/11/2020   Procedure: POLYPECTOMY;  Surgeon: Lavena Bullion, DO;  Location: [facility];  Service: Gastroenterology;;  . right knee replacement  2007  . total knee replacement L  03-13-08  . TUBAL LIGATION      Family History  Problem Relation Age of Onset  . Hypertension Mother   . Ovarian cancer Mother   . Hypertension Father   . Cancer Father 69       prostate cancer   . Diabetes Father   . Diabetes Other   . Hypertension Other   . Colon cancer Neg Hx   . Esophageal cancer Neg Hx   . Rectal cancer Neg Hx   . Stomach cancer Neg Hx   . Breast cancer Neg Hx       Social History   Socioeconomic History  . Marital status: Single    Spouse name: Not on file  . Number of children: 4  . Years of education: Not on file  . Highest education level: Not on file  Occupational History  . Not on file  Tobacco Use  . Smoking status: Former Smoker    Packs/day: 0.25    Years: 20.00    Pack years: 5.00    Quit date: 12/30/2007    Years since quitting: 13.3  . Smokeless tobacco: Never Used  Vaping Use  .  Vaping Use: Never used  Substance and Sexual Activity  . 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 Drug use: No  . Sexual activity: Not Currently    Birth control/protection: Post-menopausal  Other Topics Concern  . Not on file  Social History Narrative   Regular exercise: walk a few times a week   Caffeine use: none   Single, raising her young great granddaughter and watches grandchildren after school   Has a room  mate to help prn   Retired from nursing tech--worked at [employer] Strain: [place]   . Difficulty of Paying Living Expenses: Not hard at all  Food Insecurity: No Food Insecurity  . Worried About Charity fundraiser in the Last Year: Never true  . Ran Out of Food in the Last Year: Never true  Transportation Needs: No Transportation Needs  . Lack of Transportation (Medical): No  . Lack of Transportation (Non-Medical): No  Physical Activity: Sufficiently Active  . Days of Exercise per Week: 5 days  . Minutes of Exercise per Session: 30 min  Stress: No Stress Concern Present  . Feeling of Stress : Not at all  Social Connections: Socially Isolated  . Frequency of Communication with Friends and Family: More than three times a week  . Frequency of Social Gatherings with Friends and Family: More than three times a week  . Attends Religious Services: Never  . Active Member of Clubs or Organizations: No  . Attends Archivist Meetings: Never  . Marital Status: Divorced    Allergies  Allergen Reactions  . Amlodipine Swelling and Other (See Comments)    Edema whole body, especially in feet  . Benazepril Hcl Cough  . Codeine Itching     Current Outpatient Medications:  .  aspirin EC 81 MG tablet, Take 81 mg by mouth daily., Disp: , Rfl:  .  BLACK COHOSH EXTRACT PO, Take 1 tablet by mouth daily., Disp: ,  Rfl:  .  Cholecalciferol 25 MCG (1000 UT) tablet, Take 1,000 Units by mouth daily. , Disp: , Rfl:  .  EQ LORATADINE 10 MG tablet, Take 1 tablet by mouth once daily, Disp: 90 tablet, Rfl: 3 .  furosemide (LASIX) 20 MG tablet, Take 1-2 tablets (20-40 mg total) by mouth daily., Disp: 180 tablet, Rfl: 3 .  glucosamine-chondroitin 500-400 MG tablet, Take 1 tablet by mouth daily., Disp: , Rfl:  .  HYDROcodone-acetaminophen (NORCO/VICODIN) 5-325 MG tablet, Take 1-2 tablets by mouth every 6 (six) hours as needed for severe pain., Disp: 10 tablet, Rfl: 0 .  ibuprofen (ADVIL) 800 MG tablet, Take 1 tablet (800 mg total) by mouth 3 (three) times daily., Disp: 21 tablet, Rfl: 0 .  linezolid (ZYVOX) 600 MG tablet, Take 1 tablet (600 mg total) by mouth 2 (two) times daily., Disp: 21 tablet, Rfl: 0 .  losartan (COZAAR) 100 MG tablet, Take 1 tablet (100 mg total) by mouth daily., Disp: 90 tablet, Rfl: 3 .  meloxicam (MOBIC) 7.5 MG tablet, TAKE 1 TO 2 TABLETS BY MOUTH ONCE DAILY AS NEEDED FOR PAIN, Disp: 60 tablet, Rfl: 3 .  Multiple Vitamin (MULTIVITAMIN WITH MINERALS) TABS tablet, Take 1 tablet by mouth daily. , Disp: , Rfl:  .  pantoprazole (PROTONIX) 40 MG tablet, Take 1 tablet (40 mg total) by mouth daily., Disp: 90 tablet, Rfl: 3 .  potassium chloride SA (KLOR-CON) 20 MEQ tablet, Take 1 tablet (20 mEq total) by mouth daily. Annual appt due in May must see provider for future refills, Disp: 90 tablet, Rfl: 0 .  vitamin C (ASCORBIC ACID) 500 MG tablet, Take 500 mg by mouth daily., Disp: , Rfl:    Review of Systems  Constitutional: Negative for activity change, appetite change, chills, diaphoresis, fatigue, fever and unexpected weight change.  HENT: Negative for congestion, rhinorrhea, sinus pressure, sneezing, sore throat and trouble swallowing.   Eyes: Negative  for photophobia and visual disturbance.  Respiratory: Negative for cough, chest tightness, shortness of breath, wheezing and stridor.    Cardiovascular: Negative for chest pain, palpitations and leg swelling.  Gastrointestinal: Negative for abdominal distention, abdominal pain, anal bleeding, blood in stool, constipation, diarrhea, nausea and vomiting.  Genitourinary: Negative for difficulty urinating, dysuria, flank pain and hematuria.  Musculoskeletal: Negative for arthralgias, back pain, gait problem, joint swelling and myalgias.  Skin: Positive for color change and wound. Negative for pallor and rash.  Neurological: Negative for dizziness, tremors, weakness and light-headedness.  Hematological: Negative for adenopathy. Does not bruise/bleed easily.  Psychiatric/Behavioral: Negative for agitation, behavioral problems, confusion, decreased concentration, dysphoric mood and sleep disturbance.       Objective:   Physical Exam Constitutional:      General: She is not in acute distress.    Appearance: Normal appearance. She is well-developed. She is obese. She is not ill-appearing or diaphoretic.  HENT:     Head: Normocephalic and atraumatic.     Right Ear: Hearing and external ear normal.     Left Ear: Hearing and external ear normal.     Nose: No nasal deformity or rhinorrhea.  Eyes:     General: No scleral icterus.    Conjunctiva/sclera: Conjunctivae normal.     Right eye: Right conjunctiva is not injected.     Left eye: Left conjunctiva is not injected.     Pupils: Pupils are equal, round, and reactive to light.  Neck:     Vascular: No JVD.  Cardiovascular:     Rate and Rhythm: Normal rate and regular rhythm.     Heart sounds: S1 normal and S2 normal. Murmur heard.  No friction rub.  Pulmonary:     Effort: Pulmonary effort is normal. No respiratory distress.  Abdominal:     General: Bowel sounds are normal. There is no distension.     Palpations: Abdomen is soft. There is no mass.     Tenderness: There is no abdominal tenderness.  Musculoskeletal:        General: Normal range of motion.     Right shoulder:  Normal.     Left shoulder: Normal.     Cervical back: Normal range of motion and neck supple.     Right hip: Normal.     Left hip: Normal.     Right knee: Normal.     Left knee: Normal.  Lymphadenopathy:     Head:     Right side of head: No submandibular, preauricular or posterior auricular adenopathy.     Left side of head: No submandibular, preauricular or posterior auricular adenopathy.     Cervical: No cervical adenopathy.     Right cervical: No superficial or deep cervical adenopathy.    Left cervical: No superficial or deep cervical adenopathy.  Skin:    General: Skin is warm and dry.     Coloration: Skin is not pale.     Findings: No abrasion, bruising, ecchymosis, erythema, lesion or rash.     Nails: There is no clubbing.  Neurological:     General: No focal deficit present.     Mental Status: She is alert and oriented to person, place, and time.     Sensory: No sensory deficit.     Coordination: Coordination normal.     Gait: Gait normal.  Psychiatric:        Attention and Perception: She is attentive.        Mood and Affect: Mood normal.  Speech: Speech normal.        Behavior: Behavior normal. Behavior is cooperative.        Thought Content: Thought content normal.        Judgment: Judgment normal.    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  She is to use warm compresses over the areas of more tender areas.    If does not improve will refer to General Surgery for I and D but she very much wants to avoid that  Would benefit from efforts at decolonization in the future.  Obesity: issue with panni  is making areas where infection can fluorish. 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  She is not able to get into a bathtub due to her knee replacements and limited mobility but she could do showers with Hibiclens and intranasal mupirocin.  I spent greater than 30 minutes with the patient including greater than 50% of time in face to face counsel of the patient and in coordination of her care.

## 2021-05-18 LAB — BASIC METABOLIC PANEL WITH GFR
BUN/Creatinine Ratio: 17 (calc) (ref 6–22)
BUN: 18 mg/dL (ref 7–25)
CO2: 22 mmol/L (ref 20–32)
Calcium: 9.6 mg/dL (ref 8.6–10.4)
Chloride: 105 mmol/L (ref 98–110)
Creat: 1.07 mg/dL — ABNORMAL HIGH (ref 0.60–0.93)
GFR, Est African American: 60 mL/min/{1.73_m2} (ref >=60)
GFR, Est Non African American: 52 mL/min/{1.73_m2} — ABNORMAL LOW (ref >=60)
Glucose, Bld: 111 mg/dL — ABNORMAL HIGH (ref 65–99)
Potassium: 4 mmol/L (ref 3.5–5.3)
Sodium: 138 mmol/L (ref 135–146)

## 2021-05-18 LAB — CBC WITH DIFFERENTIAL/PLATELET
Absolute Monocytes: 392 cells/uL (ref 200–950)
Basophils Absolute: 32 cells/uL (ref 0–200)
Basophils Relative: 0.9 %
Eosinophils Absolute: 182 cells/uL (ref 15–500)
Eosinophils Relative: 5.2 %
HCT: 38.2 % (ref 35.0–45.0)
Hemoglobin: 12.2 g/dL (ref 11.7–15.5)
Lymphs Abs: 1267 cells/uL (ref 850–3900)
MCH: 26.9 pg — ABNORMAL LOW (ref 27.0–33.0)
MCHC: 31.9 g/dL — ABNORMAL LOW (ref 32.0–36.0)
MCV: 84.1 fL (ref 80.0–100.0)
MPV: 9.9 fL (ref 7.5–12.5)
Monocytes Relative: 11.2 %
Neutro Abs: 1628 cells/uL (ref 1500–7800)
Neutrophils Relative %: 46.5 %
Platelets: 172 10*3/uL (ref 140–400)
RBC: 4.54 10*6/uL (ref 3.80–5.10)
RDW: 13.3 % (ref 11.0–15.0)
Total Lymphocyte: 36.2 %
WBC: 3.5 10*3/uL — ABNORMAL LOW (ref 3.8–10.8)

## 2021-05-20 ENCOUNTER — Telehealth: Payer: Self-pay

## 2021-05-20 DIAGNOSIS — L02211 Cutaneous abscess of abdominal wall: Secondary | ICD-10-CM

## 2021-05-20 MED ORDER — DOXYCYCLINE HYCLATE 100 MG PO TABS: 100.0000 mg | ORAL_TABLET | Freq: Two times a day (BID) | ORAL | 0 refills | Status: DC

## 2021-05-20 NOTE — Telephone Encounter (Signed)
Received voicemail in triage from patient stating after starting zyvox she felt a if her throat was closing and she could not swallow for a brief period. Patient states on voicemail that she has not taken medication since this and is requesting a different antibiotic to [facility].  Routing message provider.  Eugenia Mcalpine

## 2021-05-20 NOTE — Addendum Note (Signed)
Addended by: Eugenia Mcalpine on: 05/20/2021 10:58 AM   Modules accepted: Orders

## 2021-05-20 NOTE — Telephone Encounter (Signed)
Verbal order received per Dr. Tommy Medal to d/c zyvox and change to doxycycline 100mg  tab take one tablet twice a day for 3 weeks. Medication sent to pharmacy and patient made aware. Samantha [name]

## 2021-05-21 ENCOUNTER — Other Ambulatory Visit: Payer: Self-pay | Admitting: Internal Medicine

## 2021-05-21 DIAGNOSIS — Z1231 Encounter for screening mammogram for malignant neoplasm of breast: Secondary | ICD-10-CM

## 2021-05-24 ENCOUNTER — Encounter: Payer: Self-pay | Admitting: Internal Medicine

## 2021-05-28 ENCOUNTER — Other Ambulatory Visit: Payer: Self-pay

## 2021-05-28 ENCOUNTER — Ambulatory Visit (INDEPENDENT_AMBULATORY_CARE_PROVIDER_SITE_OTHER): Payer: Medicare Other | Admitting: Internal Medicine

## 2021-05-28 ENCOUNTER — Encounter: Payer: Self-pay | Admitting: Internal Medicine

## 2021-05-28 DIAGNOSIS — R739 Hyperglycemia, unspecified: Secondary | ICD-10-CM | POA: Diagnosis not present

## 2021-05-28 DIAGNOSIS — L03311 Cellulitis of abdominal wall: Secondary | ICD-10-CM | POA: Diagnosis not present

## 2021-05-28 DIAGNOSIS — N760 Acute vaginitis: Secondary | ICD-10-CM | POA: Insufficient documentation

## 2021-05-28 DIAGNOSIS — C220 Liver cell carcinoma: Secondary | ICD-10-CM | POA: Diagnosis not present

## 2021-05-28 DIAGNOSIS — I700 Atherosclerosis of aorta: Secondary | ICD-10-CM | POA: Diagnosis not present

## 2021-05-28 MED ORDER — FLUCONAZOLE 100 MG PO TABS: ORAL_TABLET | ORAL | 1 refills | Status: DC

## 2021-05-28 NOTE — Assessment & Plan Note (Signed)
Wt Readings from Last 3 Encounters:  05/28/21 294 lb 3.2 oz (133.4 kg)  05/17/21 289 lb (131.1 kg)  05/10/21 293 lb (132.9 kg)

## 2021-05-28 NOTE — Assessment & Plan Note (Addendum)
F/u w/Dr Tommy Medal On Doxy now

## 2021-05-28 NOTE — Assessment & Plan Note (Signed)
Check A1c. 

## 2021-05-28 NOTE — Progress Notes (Signed)
Subjective:  Patient ID: Samantha [name], female    DOB: 1951-07-26  Age: 70 y.o. MRN: [id number]  CC: Follow-up (6 MONTH F/U)   HPI Samantha [name] presents for liver cancer C/o a bug bite on the abd - on abx (Zyvox) - per Dr Tommy Medal C/o vaginal yeast infection  Outpatient Medications Prior to Visit  Medication Sig Dispense Refill  . aspirin EC 81 MG tablet Take 81 mg by mouth daily.    Marland Kitchen BLACK COHOSH EXTRACT PO Take 1 tablet by mouth daily.    . Cholecalciferol 25 MCG (1000 UT) tablet Take 1,000 Units by mouth daily.     . EQ LORATADINE 10 MG tablet Take 1 tablet by mouth once daily 90 tablet 3  . furosemide (LASIX) 20 MG tablet Take 1-2 tablets (20-40 mg total) by mouth daily. 180 tablet 3  . glucosamine-chondroitin 500-400 MG tablet Take 1 tablet by mouth daily.    Marland Kitchen HYDROcodone-acetaminophen (NORCO/VICODIN) 5-325 MG tablet Take 1-2 tablets by mouth every 6 (six) hours as needed for severe pain. 10 tablet 0  . ibuprofen (ADVIL) 800 MG tablet Take 1 tablet (800 mg total) by mouth 3 (three) times daily. 21 tablet 0  . losartan (COZAAR) 100 MG tablet Take 1 tablet (100 mg total) by mouth daily. 90 tablet 3  . meloxicam (MOBIC) 7.5 MG tablet TAKE 1 TO 2 TABLETS BY MOUTH ONCE DAILY AS NEEDED FOR PAIN 60 tablet 3  . Multiple Vitamin (MULTIVITAMIN WITH MINERALS) TABS tablet Take 1 tablet by mouth daily.     . pantoprazole (PROTONIX) 40 MG tablet Take 1 tablet (40 mg total) by mouth daily. 90 tablet 3  . potassium chloride SA (KLOR-CON) 20 MEQ tablet Take 1 tablet (20 mEq total) by mouth daily. Annual appt due in May must see provider for future refills 90 tablet 0  . vitamin C (ASCORBIC ACID) 500 MG tablet Take 500 mg by mouth daily.    Marland Kitchen doxycycline (VIBRA-TABS) 100 MG tablet Take 1 tablet (100 mg total) by mouth 2 (two) times daily for 21 days. (Patient not taking: Reported on 05/28/2021) 42 tablet 0   No facility-administered medications prior to visit.    ROS: Review of  Systems  Constitutional: Negative for activity change, appetite change, chills, fatigue and unexpected weight change.  HENT: Negative for congestion, mouth sores and sinus pressure.   Eyes: Negative for visual disturbance.  Respiratory: Negative for cough and chest tightness.   Gastrointestinal: Negative for abdominal pain and nausea.  Genitourinary: Negative for difficulty urinating, frequency and vaginal pain.  Musculoskeletal: Negative for back pain and gait problem.  Skin: Positive for color change. Negative for pallor and rash.  Neurological: Negative for dizziness, tremors, weakness, numbness and headaches.  Psychiatric/Behavioral: Negative for confusion and sleep disturbance.    Objective:  BP 131/72 (BP Location: Left Arm)   Pulse 86   Temp 98.4 F (36.9 C) (Oral)   Ht 5\' 1"  (1.549 m)   Wt 294 lb 3.2 oz (133.4 kg)   LMP  (LMP Unknown)   SpO2 96%   BMI 55.59 kg/m   BP Readings from Last 3 Encounters:  05/28/21 131/72  05/17/21 124/84  05/10/21 116/82    Wt Readings from Last 3 Encounters:  05/28/21 294 lb 3.2 oz (133.4 kg)  05/17/21 289 lb (131.1 kg)  05/10/21 293 lb (132.9 kg)    Physical Exam Constitutional:      General: She is not in acute distress.  Appearance: She is well-developed. She is obese.  HENT:     Head: Normocephalic.     Right Ear: External ear normal.     Left Ear: External ear normal.     Nose: Nose normal.  Eyes:     General:        Right eye: No discharge.        Left eye: No discharge.     Conjunctiva/sclera: Conjunctivae normal.     Pupils: Pupils are equal, round, and reactive to light.  Neck:     Thyroid: No thyromegaly.     Vascular: No JVD.     Trachea: No tracheal deviation.  Cardiovascular:     Rate and Rhythm: Normal rate and regular rhythm.     Heart sounds: Normal heart sounds.  Pulmonary:     Effort: No respiratory distress.     Breath sounds: No stridor. No wheezing.  Abdominal:     General: Bowel sounds are  normal. There is no distension.     Palpations: Abdomen is soft. There is no mass.     Tenderness: There is no abdominal tenderness. There is no guarding or rebound.  Musculoskeletal:        General: No tenderness.     Cervical back: Normal range of motion and neck supple.  Lymphadenopathy:     Cervical: No cervical adenopathy.  Skin:    Findings: No erythema or rash.  Neurological:     Mental Status: She is oriented to person, place, and time.     Cranial Nerves: No cranial nerve deficit.     Motor: No abnormal muscle tone.     Coordination: Coordination normal.     Deep Tendon Reflexes: Reflexes normal.  Psychiatric:        Behavior: Behavior normal.        Thought Content: Thought content normal.        Judgment: Judgment normal.   scab and induration w/erythema in the LLQ - large  Lab Results  Component Value Date   WBC 3.5 (L) 05/17/2021   HGB 12.2 05/17/2021   HCT 38.2 05/17/2021   PLT 172 05/17/2021   GLUCOSE 111 (H) 05/17/2021   CHOL 156 05/22/2020   TRIG 93.0 05/22/2020   HDL 43.30 05/22/2020   LDLCALC 94 05/22/2020   ALT 9 03/29/2021   AST 17 03/29/2021   NA 138 05/17/2021   K 4.0 05/17/2021   CL 105 05/17/2021   CREATININE 1.07 (H) 05/17/2021   BUN 18 05/17/2021   CO2 22 05/17/2021   TSH 1.37 05/22/2020   INR 1.0 03/29/2021   HGBA1C 6.0 11/27/2020    No results found.  Assessment & Plan:   There are no diagnoses linked to this encounter.   No orders of the defined types were placed in this encounter.    Follow-up: No follow-ups on file.  Walker Kehr, MD

## 2021-05-28 NOTE — Assessment & Plan Note (Addendum)
Severe Diflucan po Aquaphore prn

## 2021-05-28 NOTE — Assessment & Plan Note (Signed)
Once q 12 mo f/u now

## 2021-05-28 NOTE — Assessment & Plan Note (Signed)
Diet, exercise

## 2021-05-29 ENCOUNTER — Encounter: Payer: Self-pay | Admitting: Internal Medicine

## 2021-05-29 ENCOUNTER — Ambulatory Visit: Payer: Medicare Other | Admitting: Internal Medicine

## 2021-05-29 VITALS — BP 126/86 | HR 95 | Resp 16 | Ht 61.0 in | Wt 292.3 lb

## 2021-05-29 DIAGNOSIS — M793 Panniculitis, unspecified: Secondary | ICD-10-CM | POA: Diagnosis not present

## 2021-05-29 DIAGNOSIS — L02211 Cutaneous abscess of abdominal wall: Secondary | ICD-10-CM

## 2021-05-29 DIAGNOSIS — N760 Acute vaginitis: Secondary | ICD-10-CM | POA: Diagnosis not present

## 2021-05-29 NOTE — Patient Instructions (Signed)
Thank you for coming to see me today. It was a pleasure seeing you.  To Do: Marland Kitchen Finish off the course of doxycycline that Dr Tommy Medal had prescribed . Follow up with him as needed if symptoms do not improve.  If you have any questions or concerns, please do not hesitate to call the office at [phone].  Take Care,   Jule Ser, DO

## 2021-05-29 NOTE — Progress Notes (Signed)
[facility]  CHIEF COMPLAINT:    Follow up for panniculitis and possible abscess  SUBJECTIVE:    Samantha [name] is a 70 y.o. female with PMHx as below who presents to the clinic for panniculitis and possible abscess.   Patient has been following with my partner, Dr. Tommy Medal, for a soft tissue infection involving the pannus and lower abdomen on the left side.  She was most recently seen on 05/17/2021.  She had been on Zyvox since approximately 05/10/2021.  At her more recent visit on 5/20 she still has some areas of induration around her abdomen however this had decreased in size and overall was improved.  She was given an additional 21-day supply of Zyvox and presents today for follow-up.  Labs from 5/20 were notable for WBC 3.5, hemoglobin 12.2, platelets 172.  Creatinine 1.07.  Unfortunately, on 5/23 patient left a voicemail with triage stating that after continuing Zyvox she felt as if her throat was closing and she could not swallow for a brief period.  As a result her antibiotics were changed to doxycycline 100 mg p.o. twice daily for 3 weeks.  She was seen by her primary care physician yesterday who prescribed her oral Diflucan for severe vaginitis.  She reports that the area of concern on her left lower abdomen has continued to improve and the hard "knot-like" areas have gone down.  They are not tender. She continues on doxycycline.  Please see A&P for the details of today's visit and status of the patient's medical problems.   Patient's Medications  New Prescriptions   No medications on file  Previous Medications   ASPIRIN EC 81 MG TABLET    Take 81 mg by mouth daily.   BLACK COHOSH EXTRACT PO    Take 1 tablet by mouth daily.   CHOLECALCIFEROL 25 MCG (1000 UT) TABLET    Take 1,000 Units by mouth daily.    EQ LORATADINE 10 MG TABLET    Take 1 tablet by mouth once daily   FLUCONAZOLE (DIFLUCAN) 100 MG TABLET    Take 2 tabs on day#1, then 1 tab  daily on Days #2-10   FUROSEMIDE (LASIX) 20 MG TABLET    Take 1-2 tablets (20-40 mg total) by mouth daily.   GLUCOSAMINE-CHONDROITIN 500-400 MG TABLET    Take 1 tablet by mouth daily.   HYDROCODONE-ACETAMINOPHEN (NORCO/VICODIN) 5-325 MG TABLET    Take 1-2 tablets by mouth every 6 (six) hours as needed for severe pain.   IBUPROFEN (ADVIL) 800 MG TABLET    Take 1 tablet (800 mg total) by mouth 3 (three) times daily.   LOSARTAN (COZAAR) 100 MG TABLET    Take 1 tablet (100 mg total) by mouth daily.   MELOXICAM (MOBIC) 7.5 MG TABLET    TAKE 1 TO 2 TABLETS BY MOUTH ONCE DAILY AS NEEDED FOR PAIN   MULTIPLE VITAMIN (MULTIVITAMIN WITH MINERALS) TABS TABLET    Take 1 tablet by mouth daily.    PANTOPRAZOLE (PROTONIX) 40 MG TABLET    Take 1 tablet (40 mg total) by mouth daily.   POTASSIUM CHLORIDE SA (KLOR-CON) 20 MEQ TABLET    Take 1 tablet (20 mEq total) by mouth daily. Annual appt due in May must see provider for future refills   VITAMIN C (ASCORBIC ACID) 500 MG TABLET    Take 500 mg by mouth daily.  Modified Medications   No medications on file  Discontinued Medications  No medications on file      Past Medical History:  Diagnosis Date  . Allergy    seasonal  . Cataract    bilateral - MD just watching  . Cirrhosis ([place]) 2018  . Diverticulosis   . Edema   . Elevated glucose    no longer an issue 03-18-16  . Fatty liver 2017  . GERD (gastroesophageal reflux disease)   . H/O seasonal allergies   . Hepatitis C    hepatitis c completed  tx march 2017  . Hyperlipidemia 2011  . Hypertension   . LBP (low back pain)   . Liver cancer ([place]) 2017   IN REMISSION. WHEN IDAGNONED SIZE OF GRAPEFRUIT   . Morbid obesity ([place])   . Osteoarthritis    B knees  . Right middle lobe pulmonary nodule    resolved 2018  . Uterine fibroid   . Wears glasses     Social History   Tobacco Use  . Smoking status: Former Smoker    Packs/day: 0.25    Years: 20.00    Pack years: 5.00    Quit date: 12/30/2007     Years since quitting: 13.4  . Smokeless tobacco: Never Used  Vaping Use  . Vaping Use: Never used  Substance Use Topics  . 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 Drug use: No    Family History  Problem Relation Age of Onset  . Hypertension Mother   . Ovarian cancer Mother   . Hypertension Father   . Cancer Father 66       prostate cancer   . Diabetes Father   . Diabetes Other   . Hypertension Other   . Colon cancer Neg Hx   . Esophageal cancer Neg Hx   . Rectal cancer Neg Hx   . Stomach cancer Neg Hx   . Breast cancer Neg Hx     Allergies  Allergen Reactions  . Amlodipine Swelling and Other (See Comments)    Edema whole body, especially in feet  . Benazepril Hcl Cough  . Codeine Itching    Review of Systems  Constitutional: Negative for chills and fever.  Gastrointestinal: Negative for abdominal pain.  Genitourinary:       + vaginitis  Skin:       Hard indurated areas are improved per her report.     OBJECTIVE:    Vitals:   05/29/21 1417  BP: 126/86  Pulse: 95  Resp: 16  SpO2: 99%  Weight: 292 lb 4.8 oz (132.6 kg)  Height: 5\' 1"  (1.549 m)   Body mass index is 55.23 kg/m.  Physical Exam Constitutional:      General: She is not in acute distress.    Appearance: Normal appearance.  HENT:     Head: Normocephalic and atraumatic.  Pulmonary:     Effort: Pulmonary effort is normal. No respiratory distress.  Skin:    General: Skin is warm and dry.     Comments: Left side of abdomen with no tenderness to palpation.  No draining wounds.  Firm areas of induration noted that are improved per her report.  Neurological:     General: No focal deficit present.     Mental Status: She is alert and oriented to person, place, and time.  Psychiatric:        Mood and Affect: Mood normal.  Behavior: Behavior normal.      Labs and Microbiology: CBC Latest Ref Rng & Units  05/17/2021 03/29/2021 05/22/2020  WBC 3.8 - 10.8 Thousand/uL 3.5(L) 3.9 4.9  Hemoglobin 11.7 - 15.5 g/dL 12.2 12.2 12.3  Hematocrit 35.0 - 45.0 % 38.2 38.8 38.7  Platelets 140 - 400 Thousand/uL 172 205 201.0   CMP Latest Ref Rng & Units 05/17/2021 03/29/2021 11/27/2020  Glucose 65 - 99 mg/dL 111(H) 91 101(H)  BUN 7 - 25 mg/dL 18 20 13   Creatinine 0.60 - 0.93 mg/dL 1.07(H) 0.95(H) 0.93  Sodium 135 - 146 mmol/L 138 142 140  Potassium 3.5 - 5.3 mmol/L 4.0 4.1 4.2  Chloride 98 - 110 mmol/L 105 107 104  CO2 20 - 32 mmol/L 22 25 29   Calcium 8.6 - 10.4 mg/dL 9.6 9.4 9.4  Total Protein 6.1 - 8.1 g/dL - 7.3 7.5  Total Bilirubin 0.2 - 1.2 mg/dL - 0.4 0.5  Alkaline Phos 39 - 117 U/L - - 82  AST 10 - 35 U/L - 17 16  ALT 6 - 29 U/L - 9 9      ASSESSMENT & PLAN:   1. Abscess of skin of abdomen  2. Panniculitis  She is improving on doxycycline.  Will continue with 100mg  BID x 3 weeks as previously outlined by Dr Tommy Medal.  She will complete her course and follow up as needed if symptoms worsen or fail to improve.  3. Acute vaginitis  Prescribed Diflucan by her PCP yesterday which she will continue.  Follow up with [facility] PRN.    Raynelle Highland for Infectious [facility] 05/29/2021, 2:51 PM

## 2021-07-01 ENCOUNTER — Other Ambulatory Visit: Payer: Self-pay | Admitting: Internal Medicine

## 2021-07-17 ENCOUNTER — Other Ambulatory Visit: Payer: Self-pay

## 2021-07-17 ENCOUNTER — Ambulatory Visit
Admission: RE | Admit: 2021-07-17 | Discharge: 2021-07-17 | Disposition: A | Discharge status: Home / Self Care | Payer: Medicare Other | Source: Ambulatory Visit | Attending: Internal Medicine | Admitting: Internal Medicine

## 2021-07-17 DIAGNOSIS — Z1231 Encounter for screening mammogram for malignant neoplasm of breast: Secondary | ICD-10-CM

## 2021-08-12 DIAGNOSIS — K7469 Other cirrhosis of liver: Secondary | ICD-10-CM | POA: Diagnosis not present

## 2021-08-12 DIAGNOSIS — C220 Liver cell carcinoma: Secondary | ICD-10-CM | POA: Diagnosis not present

## 2021-08-15 ENCOUNTER — Other Ambulatory Visit: Payer: Self-pay | Admitting: Nurse Practitioner

## 2021-08-15 DIAGNOSIS — C220 Liver cell carcinoma: Secondary | ICD-10-CM

## 2021-08-15 DIAGNOSIS — K7469 Other cirrhosis of liver: Secondary | ICD-10-CM

## 2021-08-28 ENCOUNTER — Other Ambulatory Visit: Payer: Self-pay

## 2021-08-28 ENCOUNTER — Encounter: Payer: Self-pay | Admitting: Internal Medicine

## 2021-08-28 ENCOUNTER — Ambulatory Visit (INDEPENDENT_AMBULATORY_CARE_PROVIDER_SITE_OTHER): Payer: Medicare Other | Admitting: Internal Medicine

## 2021-08-28 VITALS — BP 144/92 | HR 95 | Temp 98.1°F | Ht 61.0 in | Wt 293.0 lb

## 2021-08-28 DIAGNOSIS — C220 Liver cell carcinoma: Secondary | ICD-10-CM

## 2021-08-28 DIAGNOSIS — L570 Actinic keratosis: Secondary | ICD-10-CM

## 2021-08-28 DIAGNOSIS — I700 Atherosclerosis of aorta: Secondary | ICD-10-CM | POA: Diagnosis not present

## 2021-08-28 DIAGNOSIS — R739 Hyperglycemia, unspecified: Secondary | ICD-10-CM | POA: Diagnosis not present

## 2021-08-28 DIAGNOSIS — M25569 Pain in unspecified knee: Secondary | ICD-10-CM

## 2021-08-28 DIAGNOSIS — Z23 Encounter for immunization: Secondary | ICD-10-CM | POA: Diagnosis not present

## 2021-08-28 MED ORDER — LOSARTAN POTASSIUM 100 MG PO TABS: 100.0000 mg | ORAL_TABLET | Freq: Every day | ORAL | 3 refills | Status: DC

## 2021-08-28 MED ORDER — POTASSIUM CHLORIDE CRYS ER 20 MEQ PO TBCR: 20.0000 meq | EXTENDED_RELEASE_TABLET | Freq: Every day | ORAL | 3 refills | Status: DC

## 2021-08-28 MED ORDER — PANTOPRAZOLE SODIUM 40 MG PO TBEC: 40.0000 mg | DELAYED_RELEASE_TABLET | Freq: Every day | ORAL | 3 refills | Status: DC

## 2021-08-28 MED ORDER — FUROSEMIDE 20 MG PO TABS: 20.0000 mg | ORAL_TABLET | Freq: Every day | ORAL | 3 refills | Status: DC

## 2021-08-28 MED ORDER — MELOXICAM 7.5 MG PO TABS: ORAL_TABLET | ORAL | 3 refills | Status: DC

## 2021-08-28 NOTE — Patient Instructions (Addendum)
[facility]  Postprocedure instructions :     Keep the wounds clean. You can wash them with liquid soap and water. Pat dry with gauze or a Kleenex tissue  Before applying antibiotic ointment and a Band-Aid.   You need to report immediately  if  any signs of infection develop.

## 2021-08-28 NOTE — Assessment & Plan Note (Signed)
Check A1c. 

## 2021-08-28 NOTE — Assessment & Plan Note (Signed)
Wt Readings from Last 3 Encounters:  08/28/21 293 lb (132.9 kg)  05/29/21 292 lb 4.8 oz (132.6 kg)  05/28/21 294 lb 3.2 oz (133.4 kg)  discussed

## 2021-08-28 NOTE — Assessment & Plan Note (Signed)
Pt will get [employer] shoes

## 2021-08-28 NOTE — Assessment & Plan Note (Signed)
  On diet  

## 2021-08-28 NOTE — Assessment & Plan Note (Addendum)
F/u w/IR, oncology MRI tomorrow. Pt had labs Dr Laurence Ferrari (IR) - once a year

## 2021-08-28 NOTE — Assessment & Plan Note (Signed)
See cryo 

## 2021-08-28 NOTE — Progress Notes (Signed)
Subjective:  Patient ID: Samantha [name], female    DOB: 1951/02/13  Age: 70 y.o. MRN: [id number]  CC: Follow-up (3 month f/u- want flu shot)   HPI Jaslyn Nandin presents for liver cancer, HTN, LBP f/u  Outpatient Medications Prior to Visit  Medication Sig Dispense Refill   aspirin EC 81 MG tablet Take 81 mg by mouth daily.     BLACK COHOSH EXTRACT PO Take 1 tablet by mouth daily.     Cholecalciferol 25 MCG (1000 UT) tablet Take 1,000 Units by mouth daily.      EQ ALLERGY RELIEF 10 MG tablet Take 1 tablet by mouth once daily 90 tablet 3   fluconazole (DIFLUCAN) 100 MG tablet Take 2 tabs on day#1, then 1 tab daily on Days #2-10 11 tablet 1   furosemide (LASIX) 20 MG tablet Take 1-2 tablets (20-40 mg total) by mouth daily. 180 tablet 3   glucosamine-chondroitin 500-400 MG tablet Take 1 tablet by mouth daily.     HYDROcodone-acetaminophen (NORCO/VICODIN) 5-325 MG tablet Take 1-2 tablets by mouth every 6 (six) hours as needed for severe pain. 10 tablet 0   ibuprofen (ADVIL) 800 MG tablet Take 1 tablet (800 mg total) by mouth 3 (three) times daily. 21 tablet 0   losartan (COZAAR) 100 MG tablet Take 1 tablet (100 mg total) by mouth daily. 90 tablet 3   meloxicam (MOBIC) 7.5 MG tablet TAKE 1 TO 2 TABLETS BY MOUTH ONCE DAILY AS NEEDED FOR PAIN 60 tablet 3   Multiple Vitamin (MULTIVITAMIN WITH MINERALS) TABS tablet Take 1 tablet by mouth daily.      pantoprazole (PROTONIX) 40 MG tablet Take 1 tablet (40 mg total) by mouth daily. 90 tablet 3   potassium chloride SA (KLOR-CON) 20 MEQ tablet Take 1 tablet (20 mEq total) by mouth daily. Annual appt due in May must see provider for future refills 90 tablet 0   vitamin C (ASCORBIC ACID) 500 MG tablet Take 500 mg by mouth daily.     No facility-administered medications prior to visit.    ROS: Review of Systems  Constitutional:  Negative for activity change, appetite change, chills, fatigue and unexpected weight change.  HENT:  Negative  for congestion, mouth sores and sinus pressure.   Eyes:  Negative for visual disturbance.  Respiratory:  Negative for cough and chest tightness.   Gastrointestinal:  Negative for abdominal pain and nausea.  Genitourinary:  Negative for difficulty urinating, frequency and vaginal pain.  Musculoskeletal:  Positive for back pain and gait problem.  Skin:  Negative for pallor and rash.  Neurological:  Negative for dizziness, tremors, weakness, numbness and headaches.  Psychiatric/Behavioral:  Negative for confusion, sleep disturbance and suicidal ideas.    Objective:  BP (!) 144/92 (BP Location: Left Arm)   Pulse 95   Temp 98.1 F (36.7 C) (Oral)   Ht '5\' 1"'$  (1.549 m)   Wt 293 lb (132.9 kg)   LMP  (LMP Unknown)   SpO2 97%   BMI 55.36 kg/m   BP Readings from Last 3 Encounters:  08/28/21 (!) 144/92  05/29/21 126/86  05/28/21 131/72    Wt Readings from Last 3 Encounters:  08/28/21 293 lb (132.9 kg)  05/29/21 292 lb 4.8 oz (132.6 kg)  05/28/21 294 lb 3.2 oz (133.4 kg)    Physical Exam Constitutional:      General: She is not in acute distress.    Appearance: She is well-developed. She is obese.  HENT:  Head: Normocephalic.     Right Ear: External ear normal.     Left Ear: External ear normal.     Nose: Nose normal.  Eyes:     General:        Right eye: No discharge.        Left eye: No discharge.     Conjunctiva/sclera: Conjunctivae normal.     Pupils: Pupils are equal, round, and reactive to light.  Neck:     Thyroid: No thyromegaly.     Vascular: No JVD.     Trachea: No tracheal deviation.  Cardiovascular:     Rate and Rhythm: Normal rate and regular rhythm.     Heart sounds: Normal heart sounds.  Pulmonary:     Effort: No respiratory distress.     Breath sounds: No stridor. No wheezing.  Abdominal:     General: Bowel sounds are normal. There is no distension.     Palpations: Abdomen is soft. There is no mass.     Tenderness: There is no abdominal tenderness.  There is no guarding or rebound.  Musculoskeletal:        General: Tenderness present.     Cervical back: Normal range of motion and neck supple. No rigidity.  Lymphadenopathy:     Cervical: No cervical adenopathy.  Skin:    Findings: No erythema or rash.  Neurological:     Cranial Nerves: No cranial nerve deficit.     Motor: No abnormal muscle tone.     Coordination: Coordination normal.     Deep Tendon Reflexes: Reflexes normal.  Psychiatric:        Behavior: Behavior normal.        Thought Content: Thought content normal.        Judgment: Judgment normal.  2 AKs  Procedure Note :     Procedure : Cryosurgery   Indication:   Actinic keratosis(es)   Risks including unsuccessful procedure , bleeding, infection, bruising, scar, a need for a repeat  procedure and others were explained to the patient in detail as well as the benefits. Informed consent was obtained verbally.    2 lesion(s)  on  chest/neck  was/were treated with liquid nitrogen on a Q-tip in a usual fasion . Band-Aid was applied and antibiotic ointment was given for a later use.   Tolerated well. Complications none.   Postprocedure instructions :     Keep the wounds clean. You can wash them with liquid soap and water. Pat dry with gauze or a Kleenex tissue  Before applying antibiotic ointment and a Band-Aid.   You need to report immediately  if  any signs of infection develop.   Lab Results  Component Value Date   WBC 3.5 (L) 05/17/2021   HGB 12.2 05/17/2021   HCT 38.2 05/17/2021   PLT 172 05/17/2021   GLUCOSE 111 (H) 05/17/2021   CHOL 156 05/22/2020   TRIG 93.0 05/22/2020   HDL 43.30 05/22/2020   LDLCALC 94 05/22/2020   ALT 9 03/29/2021   AST 17 03/29/2021   NA 138 05/17/2021   K 4.0 05/17/2021   CL 105 05/17/2021   CREATININE 1.07 (H) 05/17/2021   BUN 18 05/17/2021   CO2 22 05/17/2021   TSH 1.37 05/22/2020   INR 1.0 03/29/2021   HGBA1C 6.0 11/27/2020    MM 3D SCREEN BREAST BILATERAL  Result  Date: 07/18/2021 CLINICAL DATA:  Screening. EXAM: DIGITAL SCREENING BILATERAL MAMMOGRAM WITH TOMOSYNTHESIS AND CAD TECHNIQUE: Bilateral screening digital craniocaudal and  mediolateral oblique mammograms were obtained. Bilateral screening digital breast tomosynthesis was performed. The images were evaluated with computer-aided detection. COMPARISON:  Previous exam(s). ACR Breast Density Category a: The breast tissue is almost entirely fatty. FINDINGS: There are no findings suspicious for malignancy. IMPRESSION: No mammographic evidence of malignancy. A result letter of this screening mammogram will be mailed directly to the patient. RECOMMENDATION: Screening mammogram in one year. (Code:[id number]) BI-RADS CATEGORY  1: Negative. Electronically Signed   By: Nolon Nations M.D.   On: 07/18/2021 16:05    Assessment & Plan:        Follow-up: No follow-ups on file.  Walker Kehr, MD

## 2021-08-29 ENCOUNTER — Ambulatory Visit
Admission: RE | Admit: 2021-08-29 | Discharge: 2021-08-29 | Disposition: A | Discharge status: Home / Self Care | Payer: Medicare Other | Source: Ambulatory Visit | Attending: Nurse Practitioner | Admitting: Nurse Practitioner

## 2021-08-29 DIAGNOSIS — K7469 Other cirrhosis of liver: Secondary | ICD-10-CM

## 2021-08-29 DIAGNOSIS — K7689 Other specified diseases of liver: Secondary | ICD-10-CM | POA: Diagnosis not present

## 2021-08-29 DIAGNOSIS — C220 Liver cell carcinoma: Secondary | ICD-10-CM

## 2021-08-29 DIAGNOSIS — Z9049 Acquired absence of other specified parts of digestive tract: Secondary | ICD-10-CM | POA: Diagnosis not present

## 2021-08-29 MED ORDER — GADOBENATE DIMEGLUMINE 529 MG/ML IV SOLN
20.0000 mL | Freq: Once | INTRAVENOUS | Status: AC | PRN
  Administered 2021-08-29: 20 mL via INTRAVENOUS

## 2021-09-09 ENCOUNTER — Ambulatory Visit
Admission: EM | Admit: 2021-09-09 | Discharge: 2021-09-09 | Disposition: A | Discharge status: Home / Self Care | Payer: Medicare Other | Attending: Urgent Care | Admitting: Urgent Care

## 2021-09-09 ENCOUNTER — Encounter: Payer: Self-pay | Admitting: Emergency Medicine

## 2021-09-09 ENCOUNTER — Other Ambulatory Visit: Payer: Self-pay

## 2021-09-09 DIAGNOSIS — R1032 Left lower quadrant pain: Secondary | ICD-10-CM

## 2021-09-09 DIAGNOSIS — T304 Corrosion of unspecified body region, unspecified degree: Secondary | ICD-10-CM | POA: Diagnosis not present

## 2021-09-09 MED ORDER — SILVER SULFADIAZINE 1 % EX CREA: 1.0000 "application " | TOPICAL_CREAM | Freq: Every day | CUTANEOUS | 0 refills | Status: AC

## 2021-09-09 MED ORDER — CEPHALEXIN 500 MG PO CAPS: 500.0000 mg | ORAL_CAPSULE | Freq: Three times a day (TID) | ORAL | 0 refills | Status: DC
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
## 2021-09-09 NOTE — Discharge Instructions (Addendum)
Please change your dressing 2-3 times daily. Apply silvadene cream generously and secure with a non-stick dressing. Each time you change your dressing, make sure you clean gently around the perimeter of the wound with gentle soap and warm water. Do not peel off any dead skin. If it comes off in the process of washing your wound or removing the dressing, that's okay. Pat your wound dry and let it air out if possible for an hour before reapplying another dressing.
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
## 2021-11-28 ENCOUNTER — Other Ambulatory Visit: Payer: Self-pay | Admitting: Nurse Practitioner

## 2021-11-28 DIAGNOSIS — D376 Neoplasm of uncertain behavior of liver, gallbladder and bile ducts: Secondary | ICD-10-CM

## 2021-12-15 ENCOUNTER — Other Ambulatory Visit: Payer: Self-pay

## 2021-12-15 ENCOUNTER — Ambulatory Visit
Admission: RE | Admit: 2021-12-15 | Discharge: 2021-12-15 | Disposition: A | Discharge status: Home / Self Care | Payer: Medicare Other | Source: Ambulatory Visit | Attending: Nurse Practitioner | Admitting: Nurse Practitioner

## 2021-12-15 DIAGNOSIS — C220 Liver cell carcinoma: Secondary | ICD-10-CM | POA: Diagnosis not present

## 2021-12-15 DIAGNOSIS — D376 Neoplasm of uncertain behavior of liver, gallbladder and bile ducts: Secondary | ICD-10-CM

## 2021-12-15 DIAGNOSIS — I700 Atherosclerosis of aorta: Secondary | ICD-10-CM | POA: Diagnosis not present

## 2021-12-15 DIAGNOSIS — Z9049 Acquired absence of other specified parts of digestive tract: Secondary | ICD-10-CM | POA: Diagnosis not present

## 2021-12-15 MED ORDER — GADOBENATE DIMEGLUMINE 529 MG/ML IV SOLN
20.0000 mL | Freq: Once | INTRAVENOUS | Status: AC | PRN
  Administered 2021-12-15: 16:00:00 20 mL via INTRAVENOUS

## 2021-12-31 ENCOUNTER — Other Ambulatory Visit: Payer: Self-pay

## 2021-12-31 ENCOUNTER — Encounter: Payer: Self-pay | Admitting: Internal Medicine

## 2021-12-31 ENCOUNTER — Ambulatory Visit (INDEPENDENT_AMBULATORY_CARE_PROVIDER_SITE_OTHER): Payer: Medicare Other | Admitting: Internal Medicine

## 2021-12-31 VITALS — BP 132/90 | HR 94 | Temp 98.2°F | Ht 61.0 in | Wt 295.6 lb

## 2021-12-31 DIAGNOSIS — I10 Essential (primary) hypertension: Secondary | ICD-10-CM

## 2021-12-31 DIAGNOSIS — I700 Atherosclerosis of aorta: Secondary | ICD-10-CM

## 2021-12-31 DIAGNOSIS — G8929 Other chronic pain: Secondary | ICD-10-CM

## 2021-12-31 DIAGNOSIS — C220 Liver cell carcinoma: Secondary | ICD-10-CM | POA: Diagnosis not present

## 2021-12-31 DIAGNOSIS — R739 Hyperglycemia, unspecified: Secondary | ICD-10-CM

## 2021-12-31 DIAGNOSIS — M5440 Lumbago with sciatica, unspecified side: Secondary | ICD-10-CM | POA: Diagnosis not present

## 2021-12-31 LAB — COMPREHENSIVE METABOLIC PANEL
ALT: 11 U/L (ref 0–35)
AST: 18 U/L (ref 0–37)
Albumin: 4.1 g/dL (ref 3.5–5.2)
Alkaline Phosphatase: 80 U/L (ref 39–117)
BUN: 18 mg/dL (ref 6–23)
CO2: 26 mEq/L (ref 19–32)
Calcium: 9.3 mg/dL (ref 8.4–10.5)
Chloride: 107 mEq/L (ref 96–112)
Creatinine, Ser: 1.06 mg/dL (ref 0.40–1.20)
GFR: 52.58 mL/min — ABNORMAL LOW (ref >60.00)
Glucose, Bld: 103 mg/dL — ABNORMAL HIGH (ref 70–99)
Potassium: 4.2 mEq/L (ref 3.5–5.1)
Sodium: 141 mEq/L (ref 135–145)
Total Bilirubin: 0.4 mg/dL (ref 0.2–1.2)
Total Protein: 7.4 g/dL (ref 6.0–8.3)

## 2021-12-31 LAB — HEMOGLOBIN A1C: Hgb A1c MFr Bld: 6.1 % (ref 4.6–6.5)

## 2021-12-31 LAB — TSH: TSH: 1.91 u[IU]/mL (ref 0.35–5.50)

## 2021-12-31 NOTE — Assessment & Plan Note (Signed)
Cont on Losartan, Norvasc, Lasix

## 2021-12-31 NOTE — Assessment & Plan Note (Signed)
Meloxicam prn 

## 2021-12-31 NOTE — Assessment & Plan Note (Signed)
Wt Readings from Last 3 Encounters:  12/31/21 295 lb 9.6 oz (134.1 kg)  09/09/21 293 lb (132.9 kg)  08/28/21 293 lb (132.9 kg)

## 2021-12-31 NOTE — Assessment & Plan Note (Signed)
Cont w/cancer care  Pt had another MRI - IMPRESSION: 1. Similar appearance of ablation defect within segment 6/8, without local recurrence. 2. Suspect mild cirrhosis. 3. Similar size of a segment 7 observation which demonstrates arterial and persistent contrast enhancement. LR 3. 4.  Aortic Atherosclerosis (ICD10-I70.0).   Electronically Signed   By: Abigail Miyamoto M.D.   On: 12/16/2021 11:39

## 2021-12-31 NOTE — Assessment & Plan Note (Signed)
Monitor glucose.

## 2021-12-31 NOTE — Assessment & Plan Note (Signed)
Diet, exercise

## 2021-12-31 NOTE — Progress Notes (Signed)
Subjective:  Patient ID: Samantha [name], female    DOB: 25-Feb-1951  Age: 70 y.o. MRN: [id number]  CC: Follow-up (4 MONTH F/U)   HPI Samantha [name] presents for HTN, OA, liver cancer f/u  Outpatient Medications Prior to Visit  Medication Sig Dispense Refill   aspirin EC 81 MG tablet Take 81 mg by mouth daily.     BLACK COHOSH EXTRACT PO Take 1 tablet by mouth daily.     Cholecalciferol 25 MCG (1000 UT) tablet Take 1,000 Units by mouth daily.      EQ ALLERGY RELIEF 10 MG tablet Take 1 tablet by mouth once daily 90 tablet 3   furosemide (LASIX) 20 MG tablet Take 1-2 tablets (20-40 mg total) by mouth daily. 180 tablet 3   glucosamine-chondroitin 500-400 MG tablet Take 1 tablet by mouth daily.     losartan (COZAAR) 100 MG tablet Take 1 tablet (100 mg total) by mouth daily. 90 tablet 3   meloxicam (MOBIC) 7.5 MG tablet TAKE 1 TO 2 TABLETS BY MOUTH ONCE DAILY AS NEEDED FOR PAIN 60 tablet 3   Multiple Vitamin (MULTIVITAMIN WITH MINERALS) TABS tablet Take 1 tablet by mouth daily.      pantoprazole (PROTONIX) 40 MG tablet Take 1 tablet (40 mg total) by mouth daily. 90 tablet 3   potassium chloride SA (KLOR-CON) 20 MEQ tablet Take 1 tablet (20 mEq total) by mouth daily. 90 tablet 3   silver sulfADIAZINE (SILVADENE) 1 % cream Apply 1 application topically daily. 100 g 0   vitamin C (ASCORBIC ACID) 500 MG tablet Take 500 mg by mouth daily.     HYDROcodone-acetaminophen (NORCO/VICODIN) 5-325 MG tablet Take 1-2 tablets by mouth every 6 (six) hours as needed for severe pain. 10 tablet 0   cephALEXin (KEFLEX) 500 MG capsule Take 1 capsule (500 mg total) by mouth 3 (three) times daily. (Patient not taking: Reported on 12/31/2021) 21 capsule 0   fluconazole (DIFLUCAN) 100 MG tablet Take 2 tabs on day#1, then 1 tab daily on Days #2-10 (Patient not taking: Reported on 12/31/2021) 11 tablet 1   No facility-administered medications prior to visit.    ROS: Review of Systems  Constitutional:   Negative for activity change, appetite change, chills, fatigue and unexpected weight change.  HENT:  Negative for congestion, mouth sores and sinus pressure.   Eyes:  Negative for visual disturbance.  Respiratory:  Negative for cough and chest tightness.   Gastrointestinal:  Negative for abdominal pain and nausea.  Genitourinary:  Negative for difficulty urinating, frequency and vaginal pain.  Musculoskeletal:  Negative for back pain and gait problem.  Skin:  Negative for pallor and rash.  Neurological:  Negative for dizziness, tremors, weakness, numbness and headaches.  Psychiatric/Behavioral:  Negative for confusion, sleep disturbance and suicidal ideas. The patient is not nervous/anxious.    Objective:  BP 132/90 (BP Location: Left Arm)    Pulse 94    Temp 98.2 F (36.8 C) (Oral)    Ht 5\' 1"  (1.549 m)    Wt 295 lb 9.6 oz (134.1 kg)    LMP  (LMP Unknown)    SpO2 97%    BMI 55.85 kg/m   BP Readings from Last 3 Encounters:  12/31/21 132/90  09/09/21 139/83  08/28/21 (!) 144/92    Wt Readings from Last 3 Encounters:  12/31/21 295 lb 9.6 oz (134.1 kg)  09/09/21 293 lb (132.9 kg)  08/28/21 293 lb (132.9 kg)    Physical Exam Constitutional:  General: She is not in acute distress.    Appearance: She is well-developed. She is obese.  HENT:     Head: Normocephalic.     Right Ear: External ear normal.     Left Ear: External ear normal.     Nose: Nose normal.  Eyes:     General:        Right eye: No discharge.        Left eye: No discharge.     Conjunctiva/sclera: Conjunctivae normal.     Pupils: Pupils are equal, round, and reactive to light.  Neck:     Thyroid: No thyromegaly.     Vascular: No JVD.     Trachea: No tracheal deviation.  Cardiovascular:     Rate and Rhythm: Normal rate and regular rhythm.     Heart sounds: Normal heart sounds.  Pulmonary:     Effort: No respiratory distress.     Breath sounds: No stridor. No wheezing.  Abdominal:     General: Bowel  sounds are normal. There is no distension.     Palpations: Abdomen is soft. There is no mass.     Tenderness: There is no abdominal tenderness. There is no guarding or rebound.  Musculoskeletal:        General: No tenderness.     Cervical back: Normal range of motion and neck supple. No rigidity.  Lymphadenopathy:     Cervical: No cervical adenopathy.  Skin:    Findings: No erythema or rash.  Neurological:     Cranial Nerves: No cranial nerve deficit.     Motor: No abnormal muscle tone.     Coordination: Coordination normal.     Deep Tendon Reflexes: Reflexes normal.  Psychiatric:        Behavior: Behavior normal.        Thought Content: Thought content normal.        Judgment: Judgment normal.  Trace edema B  Lab Results  Component Value Date   WBC 3.5 (L) 05/17/2021   HGB 12.2 05/17/2021   HCT 38.2 05/17/2021   PLT 172 05/17/2021   GLUCOSE 111 (H) 05/17/2021   CHOL 156 05/22/2020   TRIG 93.0 05/22/2020   HDL 43.30 05/22/2020   LDLCALC 94 05/22/2020   ALT 9 03/29/2021   AST 17 03/29/2021   NA 138 05/17/2021   K 4.0 05/17/2021   CL 105 05/17/2021   CREATININE 1.07 (H) 05/17/2021   BUN 18 05/17/2021   CO2 22 05/17/2021   TSH 1.37 05/22/2020   INR 1.0 03/29/2021   HGBA1C 6.0 11/27/2020    MR ABDOMEN WWO CONTRAST  Result Date: 12/16/2021 CLINICAL DATA:  Prior chemoembolization and thermal ablation of hepatocellular carcinoma. Chemoembolization 02/21/2016. Thermal ablation 03/21/2016. Segment 8 hepatocellular carcinoma. EXAM: MRI ABDOMEN WITHOUT AND WITH CONTRAST TECHNIQUE: Multiplanar multisequence MR imaging of the abdomen was performed both before and after the administration of intravenous contrast. CONTRAST:  48mL MULTIHANCE GADOBENATE DIMEGLUMINE 529 MG/ML IV SOLN COMPARISON:  08/30/2021 FINDINGS: Lower chest: Normal heart size without pericardial or pleural effusion. Hepatobiliary: Suspect mild cirrhosis. The segment 6/8 ablation defect measures 2.6 x 2.3 cm on  37/13, similar to on the prior exam (when remeasured). Demonstrates precontrast heterogeneous T1 signal, but no post-contrast enhancement, including on subtracted images. Segment 7 observation demonstrates T2 hyperintensity on 12/06. Arterial and persistent postcontrast enhancement, including at 6 mm on 23/11. Felt to be similar in size to on the prior exam (when remeasured). No delayed hypoenhancement or enhancing capsule. Cholecystectomy,  without biliary ductal dilatation. Pancreas:  Normal, without mass or ductal dilatation. Spleen:  Normal in size, without focal abnormality. Adrenals/Urinary Tract: Normal adrenal glands. Normal kidneys, without hydronephrosis. Stomach/Bowel: Normal stomach and small bowel. Scattered colonic diverticula. Vascular/Lymphatic: Aortic atherosclerosis. No retroperitoneal or retrocrural adenopathy. Other:  No ascites. Musculoskeletal: No acute osseous abnormality. IMPRESSION: 1. Similar appearance of ablation defect within segment 6/8, without local recurrence. 2. Suspect mild cirrhosis. 3. Similar size of a segment 7 observation which demonstrates arterial and persistent contrast enhancement. LR 3. 4.  Aortic Atherosclerosis (ICD10-I70.0). Electronically Signed   By: Abigail Miyamoto M.D.   On: 12/16/2021 11:39    Assessment & Plan:   Problem List Items Addressed This Visit     Atherosclerosis of aorta (HCC)    Diet, exercise      Essential hypertension - Primary    Cont on Losartan, Norvasc, Lasix      Relevant Orders   Comprehensive metabolic panel   Hemoglobin A1c   TSH   Hepatocellular carcinoma ([place])    Cont w/cancer care  Pt had another MRI - IMPRESSION: 1. Similar appearance of ablation defect within segment 6/8, without local recurrence. 2. Suspect mild cirrhosis. 3. Similar size of a segment 7 observation which demonstrates arterial and persistent contrast enhancement. LR 3. 4.  Aortic Atherosclerosis (ICD10-I70.0).     Electronically Signed   By:  Abigail Miyamoto M.D.   On: 12/16/2021 11:39        Relevant Orders   Comprehensive metabolic panel   TSH   Hyperglycemia    Monitor glucose      Relevant Orders   Hemoglobin A1c   Low back pain    Meloxicam prn      OBESITY, MORBID    Wt Readings from Last 3 Encounters:  12/31/21 295 lb 9.6 oz (134.1 kg)  09/09/21 293 lb (132.9 kg)  08/28/21 293 lb (132.9 kg)        Relevant Orders   TSH      No orders of the defined types were placed in this encounter.     Follow-up: Return in about 3 months (around 03/31/2022) for a follow-up visit.  Walker Kehr, MD

## 2022-01-06 ENCOUNTER — Other Ambulatory Visit: Payer: Self-pay

## 2022-01-06 ENCOUNTER — Ambulatory Visit: Payer: Medicare Other

## 2022-01-17 ENCOUNTER — Other Ambulatory Visit: Payer: Self-pay

## 2022-01-17 ENCOUNTER — Ambulatory Visit (INDEPENDENT_AMBULATORY_CARE_PROVIDER_SITE_OTHER): Payer: Medicare Other

## 2022-01-17 DIAGNOSIS — Z0000 Encounter for general adult medical examination without abnormal findings: Secondary | ICD-10-CM

## 2022-01-17 DIAGNOSIS — Z780 Asymptomatic menopausal state: Secondary | ICD-10-CM | POA: Diagnosis not present

## 2022-01-17 NOTE — Patient Instructions (Signed)
Samantha [name] , Thank you for taking time to come for your Medicare Wellness Visit. I appreciate your ongoing commitment to your health goals. Please review the following plan we discussed and let me know if I can assist you in the future.   Screening recommendations/referrals: Colonoscopy: 07/11/2020 Mammogram: 07/17/2021 Bone Density: ordered 01/17/2022 Recommended yearly ophthalmology/optometry visit for glaucoma screening and checkup Recommended yearly dental visit for hygiene and checkup  Vaccinations: Influenza vaccine: completed  Pneumococcal vaccine: completed  Tdap vaccine: 11/30/2012  due 2023 Shingles vaccine: will consider     Advanced directives: yes   Conditions/risks identified: none   Next appointment: none    Preventive Care 25 Years and Older, Female Preventive care refers to lifestyle choices and visits with your health care provider that can promote health and wellness. What does preventive care include? A yearly physical exam. This is also called an annual well check. Dental exams once or twice a year. Routine eye exams. Ask your health care provider how often you should have your eyes checked. Personal lifestyle choices, including: Daily care of your teeth and gums. Regular physical activity. Eating a healthy diet. Avoiding tobacco and drug use. Limiting alcohol use. Practicing safe sex. Taking low-dose aspirin every day. Taking vitamin and mineral supplements as recommended by your health care provider. What happens during an annual well check? The services and screenings done by your health care provider during your annual well check will depend on your age, overall health, lifestyle risk factors, and family history of disease. Counseling  Your health care provider may ask you questions about your: Alcohol use. Tobacco use. Drug use. Emotional well-being. Home and relationship well-being. Sexual activity. Eating habits. History of falls. Memory  and ability to understand (cognition). Work and work Statistician. Reproductive health. Screening  You may have the following tests or measurements: Height, weight, and BMI. Blood pressure. Lipid and cholesterol levels. These may be checked every 5 years, or more frequently if you are over 14 years old. Skin check. Lung cancer screening. You may have this screening every year starting at age 77 if you have a 30-pack-year history of smoking and currently smoke or have quit within the past 15 years. Fecal occult blood test (FOBT) of the stool. You may have this test every year starting at age 83. Flexible sigmoidoscopy or colonoscopy. You may have a sigmoidoscopy every 5 years or a colonoscopy every 10 years starting at age 33. Hepatitis C blood test. Hepatitis B blood test. Sexually transmitted disease (STD) testing. Diabetes screening. This is done by checking your blood sugar (glucose) after you have not eaten for a while (fasting). You may have this done every 1-3 years. Bone density scan. This is done to screen for osteoporosis. You may have this done starting at age 34. Mammogram. This may be done every 1-2 years. Talk to your health care provider about how often you should have regular mammograms. Talk with your health care provider about your test results, treatment options, and if necessary, the need for more tests. Vaccines  Your health care provider may recommend certain vaccines, such as: Influenza vaccine. This is recommended every year. Tetanus, diphtheria, and acellular pertussis (Tdap, Td) vaccine. You may need a Td booster every 10 years. Zoster vaccine. You may need this after age 46. Pneumococcal 13-valent conjugate (PCV13) vaccine. One dose is recommended after age 84. Pneumococcal polysaccharide (PPSV23) vaccine. One dose is recommended after age 84. Talk to your health care provider about which screenings and vaccines you  need and how often you need them. This  information is not intended to replace advice given to you by your health care provider. Make sure you discuss any questions you have with your health care provider. Document Released: 01/11/2016 Document Revised: 09/03/2016 Document Reviewed: 10/16/2015 Elsevier Interactive Patient Education  2017 [employer] Prevention in the Home Falls can cause injuries. They can happen to people of all ages. There are many things you can do to make your home safe and to help prevent falls. What can I do on the outside of my home? Regularly fix the edges of walkways and driveways and fix any cracks. Remove anything that might make you trip as you walk through a door, such as a raised step or threshold. Trim any bushes or trees on the path to your home. Use bright outdoor lighting. Clear any walking paths of anything that might make someone trip, such as rocks or tools. Regularly check to see if handrails are loose or broken. Make sure that both sides of any steps have handrails. Any raised decks and porches should have guardrails on the edges. Have any leaves, snow, or ice cleared regularly. Use sand or salt on walking paths during winter. Clean up any spills in your garage right away. This includes oil or grease spills. What can I do in the bathroom? Use night lights. Install grab bars by the toilet and in the tub and shower. Do not use towel bars as grab bars. Use non-skid mats or decals in the tub or shower. If you need to sit down in the shower, use a plastic, non-slip stool. Keep the floor dry. Clean up any water that spills on the floor as soon as it happens. Remove soap buildup in the tub or shower regularly. Attach bath mats securely with double-sided non-slip rug tape. Do not have throw rugs and other things on the floor that can make you trip. What can I do in the bedroom? Use night lights. Make sure that you have a light by your bed that is easy to reach. Do not use any sheets or  blankets that are too big for your bed. They should not hang down onto the floor. Have a firm chair that has side arms. You can use this for support while you get dressed. Do not have throw rugs and other things on the floor that can make you trip. What can I do in the kitchen? Clean up any spills right away. Avoid walking on wet floors. Keep items that you use a lot in easy-to-reach places. If you need to reach something above you, use a strong step stool that has a grab bar. Keep electrical cords out of the way. Do not use floor polish or wax that makes floors slippery. If you must use wax, use non-skid floor wax. Do not have throw rugs and other things on the floor that can make you trip. What can I do with my stairs? Do not leave any items on the stairs. Make sure that there are handrails on both sides of the stairs and use them. Fix handrails that are broken or loose. Make sure that handrails are as long as the stairways. Check any carpeting to make sure that it is firmly attached to the stairs. Fix any carpet that is loose or worn. Avoid having throw rugs at the top or bottom of the stairs. If you do have throw rugs, attach them to the floor with carpet tape. Make sure that  you have a light switch at the top of the stairs and the bottom of the stairs. If you do not have them, ask someone to add them for you. What else can I do to help prevent falls? Wear shoes that: Do not have high heels. Have rubber bottoms. Are comfortable and fit you well. Are closed at the toe. Do not wear sandals. If you use a stepladder: Make sure that it is fully opened. Do not climb a closed stepladder. Make sure that both sides of the stepladder are locked into place. Ask someone to hold it for you, if possible. Clearly mark and make sure that you can see: Any grab bars or handrails. First and last steps. Where the edge of each step is. Use tools that help you move around (mobility aids) if they are  needed. These include: Canes. Walkers. Scooters. Crutches. Turn on the lights when you go into a dark area. Replace any light bulbs as soon as they burn out. Set up your furniture so you have a clear path. Avoid moving your furniture around. If any of your floors are uneven, fix them. If there are any pets around you, be aware of where they are. Review your medicines with your doctor. Some medicines can make you feel dizzy. This can increase your chance of falling. Ask your doctor what other things that you can do to help prevent falls. This information is not intended to replace advice given to you by your health care provider. Make sure you discuss any questions you have with your health care provider. Document Released: 10/11/2009 Document Revised: 05/22/2016 Document Reviewed: 01/19/2015 Elsevier Interactive Patient Education  2017 [employer].

## 2022-01-17 NOTE — Progress Notes (Addendum)
Subjective:   Samantha [name] is a 71 y.o. female who presents for Medicare Annual (Subsequent) preventive examination.  I connected with Waleska Buttery today by telephone and verified that I am speaking with the correct person using two identifiers. Location patient: home Location provider: work Persons participating in the virtual visit: patient, provider.   I discussed the limitations, risks, security and privacy concerns of performing an evaluation and management service by telephone and the availability of in person appointments. I also discussed with the patient that there may be a patient responsible charge related to this service. The patient expressed understanding and verbally consented to this telephonic visit.    Interactive audio and video telecommunications were attempted between this provider and patient, however failed, due to patient having technical difficulties OR patient did not have access to video capability.  We continued and completed visit with audio only.    Review of Systems     Cardiac Risk Factors include: advanced age (>72men, >26 women);dyslipidemia;hypertension     Objective:    Today's Vitals   There is no height or weight on file to calculate BMI.  Advanced Directives 01/17/2022 04/20/2021 11/27/2020 07/11/2020 06/06/2020 08/16/2019 08/16/2019  Does Patient Have a Medical Advance Directive? Yes No Yes Yes Yes No No  Type of Paramedic of [place];Living will - [place];Living will Living will [place];Living will - -  Does patient want to make changes to medical advance directive? - - No - Patient declined - - - -  Copy of [place] in Chart? No - copy requested - No - copy requested - No - copy requested - -  Would patient like information on creating a medical advance directive? - - - - - No - Patient declined -    Current Medications (verified) Outpatient  Encounter Medications as of 01/17/2022  Medication Sig   aspirin EC 81 MG tablet Take 81 mg by mouth daily.   BLACK COHOSH EXTRACT PO Take 1 tablet by mouth daily.   Cholecalciferol 25 MCG (1000 UT) tablet Take 1,000 Units by mouth daily.    EQ ALLERGY RELIEF 10 MG tablet Take 1 tablet by mouth once daily   furosemide (LASIX) 20 MG tablet Take 1-2 tablets (20-40 mg total) by mouth daily.   glucosamine-chondroitin 500-400 MG tablet Take 1 tablet by mouth daily.   losartan (COZAAR) 100 MG tablet Take 1 tablet (100 mg total) by mouth daily.   meloxicam (MOBIC) 7.5 MG tablet TAKE 1 TO 2 TABLETS BY MOUTH ONCE DAILY AS NEEDED FOR PAIN   Multiple Vitamin (MULTIVITAMIN WITH MINERALS) TABS tablet Take 1 tablet by mouth daily.    potassium chloride SA (KLOR-CON) 20 MEQ tablet Take 1 tablet (20 mEq total) by mouth daily.   silver sulfADIAZINE (SILVADENE) 1 % cream Apply 1 application topically daily.   vitamin C (ASCORBIC ACID) 500 MG tablet Take 500 mg by mouth daily.   pantoprazole (PROTONIX) 40 MG tablet Take 1 tablet (40 mg total) by mouth daily.   [DISCONTINUED] promethazine (PHENERGAN) 12.5 MG tablet Take 1 tablet (12.5 mg total) by mouth every 6 (six) hours as needed for nausea or vomiting.   No facility-administered encounter medications on file as of 01/17/2022.    Allergies (verified) Amlodipine, Ace inhibitors, Benazepril hcl, and Codeine   History: Past Medical History:  Diagnosis Date   Allergy    seasonal   Cataract    bilateral - MD just  watching   Cirrhosis ([place]) 2018   Diverticulosis    Edema    Elevated glucose    no longer an issue 03-18-16   Fatty liver 2017   GERD (gastroesophageal reflux disease)    H/O seasonal allergies    Hepatitis C    hepatitis c completed  tx march 2017   Hyperlipidemia 2011   Hypertension    LBP (low back pain)    Liver cancer ([place]) 2017   IN REMISSION. WHEN IDAGNONED SIZE OF GRAPEFRUIT    Morbid obesity ([place])    Osteoarthritis    B knees    Right middle lobe pulmonary nodule    resolved 2018   Uterine fibroid    Wears glasses    Past Surgical History:  Procedure Laterality Date   BIOPSY  07/04/2019   Procedure: BIOPSY;  Surgeon: Jerene Bears, MD;  Location: [facility];  Service: Gastroenterology;;   BIOPSY  07/11/2020   Procedure: BIOPSY;  Surgeon: Lavena Bullion, DO;  Location: [facility];  Service: Gastroenterology;;   CHOLECYSTECTOMY     COLONOSCOPY N/A 01/09/2015   Procedure: COLONOSCOPY;  Surgeon: Jerene Bears, MD;  Location: [facility];  Service: Gastroenterology;  Laterality: N/A;   COLONOSCOPY WITH PROPOFOL N/A 06/06/2020   Procedure: COLONOSCOPY WITH PROPOFOL;  Surgeon: Lavena Bullion, DO;  Location: [facility];  Service: Gastroenterology;  Laterality: N/A;   COLONOSCOPY WITH PROPOFOL N/A 07/11/2020   Procedure: COLONOSCOPY WITH PROPOFOL;  Surgeon: Lavena Bullion, DO;  Location: [facility];  Service: Gastroenterology;  Laterality: N/A;   ESOPHAGOGASTRODUODENOSCOPY (EGD) WITH PROPOFOL N/A 06/10/2016   Procedure: ESOPHAGOGASTRODUODENOSCOPY (EGD) WITH PROPOFOL;  Surgeon: Jerene Bears, MD;  [facility]: [facility];  Service: Gastroenterology;  Laterality: N/A;   ESOPHAGOGASTRODUODENOSCOPY (EGD) WITH PROPOFOL N/A 07/04/2019   Procedure: ESOPHAGOGASTRODUODENOSCOPY (EGD) WITH PROPOFOL;  Surgeon: Jerene Bears, MD;  Location: [facility];  Service: Gastroenterology;  Laterality: N/A;   HYSTEROSCOPY WITH D & C N/A 10/15/2018   Procedure: DILATATION AND CURETTAGE /HYSTEROSCOPY;  Surgeon: Aloha Gell, MD;  Location: [facility];  Service: Gynecology;  Laterality: N/A;   IR GENERIC HISTORICAL  07/03/2016   IR RADIOLOGIST EVAL & MGMT 07/03/2016 [name], MD [facility]   IR GENERIC HISTORICAL  11/18/2016   IR RADIOLOGIST EVAL & MGMT 11/18/2016 [name], MD [facility]   IR RADIOLOGIST EVAL & MGMT  04/09/2017   IR RADIOLOGIST EVAL & MGMT  11/05/2017   IR RADIOLOGIST EVAL & MGMT  03/13/2020    IR RADIOLOGIST EVAL & MGMT  08/30/2020   IR RADIOLOGIST EVAL & MGMT  04/10/2021   JOINT REPLACEMENT     BILATERAL KNEES   left shoulder arthroplasty     POLYPECTOMY  06/06/2020   Procedure: POLYPECTOMY;  Surgeon: Lavena Bullion, DO;  Location: [facility];  Service: Gastroenterology;;   POLYPECTOMY  07/11/2020   Procedure: POLYPECTOMY;  Surgeon: Lavena Bullion, DO;  Location: [facility];  Service: Gastroenterology;;   right knee replacement  2007   total knee replacement L  03-13-08   TUBAL LIGATION     Family History  Problem Relation Age of Onset   Hypertension Mother    Ovarian cancer Mother    Hypertension Father    Cancer Father 11       prostate cancer    Diabetes Father    Diabetes Other    Hypertension Other    Colon cancer Neg Hx    Esophageal cancer Neg Hx  Rectal cancer Neg Hx    Stomach cancer Neg Hx    Breast cancer Neg Hx    Social History   Socioeconomic History   Marital status: Single    Spouse name: Not on file   Number of children: 4   Years of education: Not on file   Highest education level: Not on file  Occupational History   Not on file  Tobacco Use   Smoking status: Former    Packs/day: 0.25    Years: 20.00    Pack years: 5.00    Types: Cigarettes    Quit date: 12/30/2007    Years since quitting: 14.0   Smokeless tobacco: Never  Vaping Use   Vaping Use: Never used  Substance and Sexual Activity   Alcohol use: No    Comment: 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, quit drinking alcohol in 10/2015    Drug use: No   Sexual activity: Not Currently    Birth control/protection: Post-menopausal  Other Topics Concern   Not on file  Social History Narrative   Regular exercise: walk a few times a week   Caffeine use: none   Single, raising her young great granddaughter and watches grandchildren after school   Has a room mate to help prn   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 [place] Strain: Low Risk    Difficulty of Paying Living Expenses: Not hard at all  Food Insecurity: No Food Insecurity   Worried About Charity fundraiser in the Last Year: Never true   Arboriculturist in the Last Year: Never true  Transportation Needs: No Transportation Needs   Lack of Transportation (Medical): No   Lack of Transportation (Non-Medical): No  Physical Activity: Sufficiently Active   Days of Exercise per Week: 5 days   Minutes of Exercise per Session: 30 min  Stress: No Stress Concern Present   Feeling of Stress : Not at all  Social Connections: Moderately Isolated   Frequency of Communication with Friends and Family: Three times a week   Frequency of Social Gatherings with Friends and Family: Three times a week   Attends Religious Services: More than 4 times per year   Active Member of Clubs or Organizations: No   Attends Archivist Meetings: Never   Marital Status: Never married    Tobacco Counseling Counseling given: Not Answered   Clinical Intake:  Pre-visit preparation completed: Yes  Pain : No/denies pain     Nutritional Risks: None Diabetes: No  How often do you need to have someone help you when you read instructions, pamphlets, or other written materials from your doctor or pharmacy?: 1 - Never What is the last grade level you completed in school?: 11th  Diabetic?no   Interpreter Needed?: No  Information entered by :: [place] of Daily Living In your present state of health, do you have any difficulty performing the following activities: 01/17/2022  Hearing? N  Vision? N  Difficulty concentrating or making decisions? N  Walking or climbing stairs? N  Dressing or bathing? N  Doing errands, shopping? N  Preparing Food and eating ? N  Using the Toilet? N  In the past six months, have you accidently leaked urine? N  Do you have problems with loss of bowel control? N  Managing  your Medications? N  Managing your Finances? N  Housekeeping or managing your Housekeeping? N  Some recent data might be hidden    Patient Care Team: Plotnikov, Evie Lacks, MD as PCP - General Frederik Pear, MD as Adriana Mccallum (Orthopedic Surgery) Everett Graff, MD (Obstetrics and Gynecology) Comer, Okey Regal, MD as Consulting Physician (Infectious Diseases) Stark Klein, MD as Consulting Physician (General Surgery) Pyrtle, Lajuan Lines, MD as Consulting Physician (Gastroenterology) Truitt Merle, MD as Consulting Physician (Hematology) Aloha Gell, MD as Consulting Physician (Obstetrics and Gynecology) Jola Schmidt, MD as Consulting Physician (Ophthalmology)  Indicate any recent Medical Services you may have received from other than [facility] providers in the past year (date may be approximate).     Assessment:   This is a routine wellness examination for Jasminne.  Hearing/Vision screen Vision Screening - Comments:: Annual eye exams wears glasses   Dietary issues and exercise activities discussed: Current Exercise Habits: Home exercise routine, Type of exercise: walking, Time (Minutes): 30, Frequency (Times/Week): 3, Weekly Exercise (Minutes/Week): 90, Intensity: Mild, Exercise limited by: None identified   Goals Addressed             This Visit's Progress    Travel as much as possible, enjoy family and love life   On track      Depression Screen PHQ 2/9 Scores 01/17/2022 01/17/2022 05/17/2021 11/27/2020 08/09/2019 11/24/2018 11/11/2017  PHQ - 2 Score 0 0 0 0 0 0 0  PHQ- 9 Score - - - - - - 0    Fall Risk Fall Risk  01/17/2022 05/17/2021 11/27/2020 08/09/2019 11/24/2018  Falls in the past year? 0 0 0 0 0  Number falls in past yr: 0 0 0 0 -  Injury with Fall? 0 0 0 0 -  Risk for fall due to : - - No Fall Risks - -  Follow up Falls evaluation completed Falls evaluation completed Falls evaluation completed Falls evaluation completed Falls evaluation completed    [place]:  Any stairs in or around the home? No  If so, are there any without handrails? No  Home free of loose throw rugs in walkways, pet beds, electrical cords, etc? Yes  Adequate lighting in your home to reduce risk of falls? Yes   ASSISTIVE DEVICES UTILIZED TO PREVENT FALLS:  Life alert? No  Use of a cane, walker or w/c? No  Grab bars in the bathroom? No  Shower chair or bench in shower? No  Elevated toilet seat or a handicapped toilet? No     Cognitive Function:Normal cognitive status assessed by direct observation by this Nurse Health Advisor. No abnormalities found.   MMSE - Mini Mental State Exam 11/11/2017  Orientation to time 5  Orientation to Place 5  Registration 3  Attention/ Calculation 5  Recall 2  Language- name 2 objects 2  Language- repeat 1  Language- follow 3 step command 3  Language- read & follow direction 1  Write a sentence 1  Copy design 1  Total score 29     6CIT Screen 11/27/2020  What Year? 0 points  What month? 0 points  What time? 0 points  Count back from 20 0 points  Months in reverse 0 points  Repeat phrase 0 points  Total Score 0    Immunizations Immunization History  Administered Date(s) Administered   Fluad Quad(high Dose 65+) 08/23/2019, 09/19/2020, 08/28/2021   Influenza Split 11/25/2011   Influenza Whole 09/13/2008, 08/31/2010   Influenza, High Dose Seasonal PF 09/17/2016, 09/08/2017, 09/23/2018   Influenza, Seasonal,  Injecte, Preservative Fre 11/30/2012   Influenza,inj,Quad PF,6+ Mos 09/06/2013, 09/05/2014, 09/07/2015   Moderna SARS-COV2 Booster Vaccination 01/06/2021   PFIZER(Purple Top)SARS-COV-2 Vaccination 03/07/2020, 04/05/2020   PPD Test 07/12/2018, 07/13/2019, 07/16/2020   Pneumococcal Conjugate-13 12/08/2014   Pneumococcal Polysaccharide-23 03/23/2012, 11/11/2017   Tdap 11/30/2012    TDAP status: Up to date  Flu Vaccine status: Up to date  Pneumococcal vaccine status: Up to  date  Covid-19 vaccine status: Completed vaccines  Qualifies for Shingles Vaccine? Yes   Zostavax completed No   Shingrix Completed?: No.    Education has been provided regarding the importance of this vaccine. Patient has been advised to call insurance company to determine out of pocket expense if they have not yet received this vaccine. Advised may also receive vaccine at local pharmacy or Health Dept. Verbalized acceptance and understanding.  Screening Tests Health Maintenance  Topic Date Due   Zoster Vaccines- Shingrix (1 of 2) Never done   DEXA SCAN  Never done   COVID-19 Vaccine (3 - Pfizer risk series) 02/03/2021   MAMMOGRAM  07/17/2022   TETANUS/TDAP  11/30/2022   COLONOSCOPY (Pts 45-61yrs Insurance coverage will need to be confirmed)  07/11/2025   Pneumonia Vaccine 99+ Years old  Completed   INFLUENZA VACCINE  Completed   Hepatitis C Screening  Completed   HPV VACCINES  Aged Out    Health Maintenance  Health Maintenance Due  Topic Date Due   Zoster Vaccines- Shingrix (1 of 2) Never done   DEXA SCAN  Never done   COVID-19 Vaccine (3 - Pfizer risk series) 02/03/2021    Colorectal cancer screening: Type of screening: Colonoscopy. Completed 07/11/2020. Repeat every 5 years  Mammogram status: Completed 07/17/2021. Repeat every year  Bone Density status: Ordered 01/17/2022. Pt provided with contact info and advised to call to schedule appt.  Lung Cancer Screening: (Low Dose CT Chest recommended if Age 27-80 years, 30 pack-year currently smoking OR have quit w/in 15years.) does not qualify.   Lung Cancer Screening Referral: n/a  Additional Screening:  Hepatitis C Screening: does not qualify; Completed 05/17/2021  Vision Screening: Recommended annual ophthalmology exams for early detection of glaucoma and other disorders of the eye. Is the patient up to date with their annual eye exam?  Yes  Who is the provider or what is the name of the office in which the patient  attends annual eye exams? Dr.Bower  If pt is not established with a provider, would they like to be referred to a provider to establish care? No .   Dental Screening: Recommended annual dental exams for proper oral hygiene  Community Resource Referral / Chronic Care Management: CRR required this visit?  No   CCM required this visit?  No      Plan:     I have personally reviewed and noted the following in the patients chart:   Medical and social history Use of alcohol, tobacco or illicit drugs  Current medications and supplements including opioid prescriptions.  Functional ability and status Nutritional status Physical activity Advanced directives List of other physicians Hospitalizations, surgeries, and ER visits in previous 12 months Vitals Screenings to include cognitive, depression, and falls Referrals and appointments  In addition, I have reviewed and discussed with patient certain preventive protocols, quality metrics, and best practice recommendations. A written personalized care plan for preventive services as well as general preventive health recommendations were provided to patient.     Randel Pigg, LPN   02/19/9797   Nurse Notes: none  Medical screening examination/treatment/procedure(s) were performed by non-physician practitioner and as supervising physician I was immediately available for consultation/collaboration.  I agree with above. Lew Dawes, MD

## 2022-02-04 DIAGNOSIS — H5213 Myopia, bilateral: Secondary | ICD-10-CM | POA: Diagnosis not present

## 2022-02-04 DIAGNOSIS — H40053 Ocular hypertension, bilateral: Secondary | ICD-10-CM | POA: Diagnosis not present

## 2022-02-04 DIAGNOSIS — H25013 Cortical age-related cataract, bilateral: Secondary | ICD-10-CM | POA: Diagnosis not present

## 2022-02-12 DIAGNOSIS — C220 Liver cell carcinoma: Secondary | ICD-10-CM | POA: Diagnosis not present

## 2022-02-12 DIAGNOSIS — K7469 Other cirrhosis of liver: Secondary | ICD-10-CM | POA: Diagnosis not present

## 2022-03-26 ENCOUNTER — Ambulatory Visit: Payer: Medicare Other | Admitting: Internal Medicine

## 2022-04-01 ENCOUNTER — Ambulatory Visit: Payer: Medicare Other | Admitting: Internal Medicine

## 2022-04-16 ENCOUNTER — Ambulatory Visit (INDEPENDENT_AMBULATORY_CARE_PROVIDER_SITE_OTHER): Payer: Medicare Other | Admitting: Internal Medicine

## 2022-04-16 ENCOUNTER — Encounter: Payer: Self-pay | Admitting: Internal Medicine

## 2022-04-16 VITALS — BP 138/84 | HR 81 | Temp 98.6°F | Ht 61.0 in | Wt 284.0 lb

## 2022-04-16 DIAGNOSIS — K7460 Unspecified cirrhosis of liver: Secondary | ICD-10-CM

## 2022-04-16 DIAGNOSIS — I10 Essential (primary) hypertension: Secondary | ICD-10-CM

## 2022-04-16 DIAGNOSIS — R140 Abdominal distension (gaseous): Secondary | ICD-10-CM | POA: Diagnosis not present

## 2022-04-16 DIAGNOSIS — R350 Frequency of micturition: Secondary | ICD-10-CM

## 2022-04-16 DIAGNOSIS — K7581 Nonalcoholic steatohepatitis (NASH): Secondary | ICD-10-CM | POA: Diagnosis not present

## 2022-04-16 DIAGNOSIS — K2950 Unspecified chronic gastritis without bleeding: Secondary | ICD-10-CM | POA: Diagnosis not present

## 2022-04-16 LAB — URINALYSIS, ROUTINE W REFLEX MICROSCOPIC
Bilirubin Urine: NEGATIVE
Hgb urine dipstick: NEGATIVE
Ketones, ur: NEGATIVE
Nitrite: NEGATIVE
RBC / HPF: NONE SEEN (ref 0–?)
Specific Gravity, Urine: 1.015 (ref 1.000–1.030)
Total Protein, Urine: NEGATIVE
Urine Glucose: NEGATIVE
Urobilinogen, UA: 0.2 (ref 0.0–1.0)
pH: 6 (ref 5.0–8.0)

## 2022-04-16 LAB — TSH: TSH: 1.67 u[IU]/mL (ref 0.35–5.50)

## 2022-04-16 LAB — COMPREHENSIVE METABOLIC PANEL
ALT: 11 U/L (ref 0–35)
AST: 21 U/L (ref 0–37)
Albumin: 4.4 g/dL (ref 3.5–5.2)
Alkaline Phosphatase: 92 U/L (ref 39–117)
BUN: 13 mg/dL (ref 6–23)
CO2: 27 mEq/L (ref 19–32)
Calcium: 9.6 mg/dL (ref 8.4–10.5)
Chloride: 105 mEq/L (ref 96–112)
Creatinine, Ser: 0.91 mg/dL (ref 0.40–1.20)
GFR: 63.02 mL/min (ref >60.00)
Glucose, Bld: 106 mg/dL — ABNORMAL HIGH (ref 70–99)
Potassium: 4.3 mEq/L (ref 3.5–5.1)
Sodium: 139 mEq/L (ref 135–145)
Total Bilirubin: 0.4 mg/dL (ref 0.2–1.2)
Total Protein: 7.8 g/dL (ref 6.0–8.3)

## 2022-04-16 LAB — CBC WITH DIFFERENTIAL/PLATELET
Basophils Absolute: 0 10*3/uL (ref 0.0–0.1)
Basophils Relative: 1 % (ref 0.0–3.0)
Eosinophils Absolute: 0.1 10*3/uL (ref 0.0–0.7)
Eosinophils Relative: 3.2 % (ref 0.0–5.0)
HCT: 40.3 % (ref 36.0–46.0)
Hemoglobin: 13 g/dL (ref 12.0–15.0)
Lymphocytes Relative: 33.6 % (ref 12.0–46.0)
Lymphs Abs: 1.3 10*3/uL (ref 0.7–4.0)
MCHC: 32.2 g/dL (ref 30.0–36.0)
MCV: 83 fl (ref 78.0–100.0)
Monocytes Absolute: 0.5 10*3/uL (ref 0.1–1.0)
Monocytes Relative: 12.4 % — ABNORMAL HIGH (ref 3.0–12.0)
Neutro Abs: 1.9 10*3/uL (ref 1.4–7.7)
Neutrophils Relative %: 49.8 % (ref 43.0–77.0)
Platelets: 192 10*3/uL (ref 150.0–400.0)
RBC: 4.86 Mil/uL (ref 3.87–5.11)
RDW: 14.8 % (ref 11.5–15.5)
WBC: 3.8 10*3/uL — ABNORMAL LOW (ref 4.0–10.5)

## 2022-04-16 MED ORDER — METRONIDAZOLE 500 MG PO TABS: 500.0000 mg | ORAL_TABLET | Freq: Three times a day (TID) | ORAL | 0 refills | Status: DC

## 2022-04-16 MED ORDER — ALIGN 4 MG PO CAPS: 1.0000 | ORAL_CAPSULE | Freq: Every day | ORAL | 1 refills | Status: DC

## 2022-04-16 NOTE — Progress Notes (Signed)
? ?Subjective:  ?Patient ID: Samantha [name], female    DOB: [birth year]  Age: 71 y.o. MRN: [id number] ? ?CC: Follow-up and pain on right side (X2 wk) ? ? ?HPI ?Samantha [name] presents for skin lesion on neck, gas (meteorism and burping) since Feb 2023. C/o abd pains on the right, LBP.  ?C/o UTI sx's x 3 d ? ? ?Outpatient Medications Prior to Visit  ?Medication Sig Dispense Refill  ? aspirin EC 81 MG tablet Take 81 mg by mouth daily.    ? BLACK COHOSH EXTRACT PO Take 1 tablet by mouth daily.    ? Cholecalciferol 25 MCG (1000 UT) tablet Take 1,000 Units by mouth daily.     ? EQ ALLERGY RELIEF 10 MG tablet Take 1 tablet by mouth once daily 90 tablet 3  ? furosemide (LASIX) 20 MG tablet Take 1-2 tablets (20-40 mg total) by mouth daily. 180 tablet 3  ? glucosamine-chondroitin 500-400 MG tablet Take 1 tablet by mouth daily.    ? losartan (COZAAR) 100 MG tablet Take 1 tablet (100 mg total) by mouth daily. 90 tablet 3  ? meloxicam (MOBIC) 7.5 MG tablet TAKE 1 TO 2 TABLETS BY MOUTH ONCE DAILY AS NEEDED FOR PAIN 60 tablet 3  ? Multiple Vitamin (MULTIVITAMIN WITH MINERALS) TABS tablet Take 1 tablet by mouth daily.     ? pantoprazole (PROTONIX) 40 MG tablet Take 1 tablet (40 mg total) by mouth daily. 90 tablet 3  ? potassium chloride SA (KLOR-CON) 20 MEQ tablet Take 1 tablet (20 mEq total) by mouth daily. 90 tablet 3  ? silver sulfADIAZINE (SILVADENE) 1 % cream Apply 1 application topically daily. 100 g 0  ? vitamin C (ASCORBIC ACID) 500 MG tablet Take 500 mg by mouth daily.    ? ?No facility-administered medications prior to visit.  ? ? ?ROS: ?Review of Systems  ?Constitutional:  Positive for unexpected weight change. Negative for activity change, appetite change, chills and fatigue.  ?HENT:  Negative for congestion, mouth sores and sinus pressure.   ?Eyes:  Negative for visual disturbance.  ?Respiratory:  Negative for cough and chest tightness.   ?Gastrointestinal:  Positive for abdominal distention. Negative for  abdominal pain, constipation, diarrhea and nausea.  ?Genitourinary:  Positive for dysuria. Negative for difficulty urinating, frequency and vaginal pain.  ?Musculoskeletal:  Positive for back pain. Negative for gait problem.  ?Skin:  Negative for pallor and rash.  ?Neurological:  Negative for dizziness, tremors, weakness, numbness and headaches.  ?Hematological:  Does not bruise/bleed easily.  ?Psychiatric/Behavioral:  Negative for confusion and sleep disturbance. The patient is not nervous/anxious.   ? ?Objective:  ?BP 138/84 (BP Location: Right Arm, Patient Position: Sitting, Cuff Size: Large)   Pulse 81   Temp 98.6 ?F (37 ?C) (Oral)   Ht '5\' 1"'$  (1.549 m)   Wt 284 lb (128.8 kg)   LMP  (LMP Unknown)   SpO2 94%   BMI 53.66 kg/m?  ? ?BP Readings from Last 3 Encounters:  ?04/16/22 138/84  ?12/31/21 132/90  ?09/09/21 139/83  ? ? ?Wt Readings from Last 3 Encounters:  ?04/16/22 284 lb (128.8 kg)  ?12/31/21 295 lb 9.6 oz (134.1 kg)  ?09/09/21 293 lb (132.9 kg)  ? ? ?Physical Exam ?Constitutional:   ?   General: She is not in acute distress. ?   Appearance: She is well-developed. She is obese.  ?HENT:  ?   Head: Normocephalic.  ?   Right Ear: External ear normal.  ?  Left Ear: External ear normal.  ?   Nose: Nose normal.  ?Eyes:  ?   General:     ?   Right eye: No discharge.     ?   Left eye: No discharge.  ?   Conjunctiva/sclera: Conjunctivae normal.  ?   Pupils: Pupils are equal, round, and reactive to light.  ?Neck:  ?   Thyroid: No thyromegaly.  ?   Vascular: No JVD.  ?   Trachea: No tracheal deviation.  ?Cardiovascular:  ?   Rate and Rhythm: Normal rate and regular rhythm.  ?   Heart sounds: Normal heart sounds.  ?Pulmonary:  ?   Effort: No respiratory distress.  ?   Breath sounds: No stridor. No wheezing.  ?Abdominal:  ?   General: Bowel sounds are normal. There is no distension.  ?   Palpations: Abdomen is soft. There is no mass.  ?   Tenderness: There is no abdominal tenderness. There is no guarding or  rebound.  ?Musculoskeletal:     ?   General: No tenderness.  ?   Cervical back: Normal range of motion and neck supple. No rigidity.  ?Lymphadenopathy:  ?   Cervical: No cervical adenopathy.  ?Skin: ?   Findings: No erythema or rash.  ?Neurological:  ?   Mental Status: She is oriented to person, place, and time.  ?   Cranial Nerves: No cranial nerve deficit.  ?   Motor: No abnormal muscle tone.  ?   Coordination: Coordination normal.  ?   Deep Tendon Reflexes: Reflexes normal.  ?Psychiatric:     ?   Behavior: Behavior normal.     ?   Thought Content: Thought content normal.     ?   Judgment: Judgment normal.  ? ? ?Lab Results  ?Component Value Date  ? WBC 3.5 (L) 05/17/2021  ? HGB 12.2 05/17/2021  ? HCT 38.2 05/17/2021  ? PLT 172 05/17/2021  ? GLUCOSE 103 (H) 12/31/2021  ? CHOL 156 05/22/2020  ? TRIG 93.0 05/22/2020  ? HDL 43.30 05/22/2020  ? [place] 94 05/22/2020  ? ALT 11 12/31/2021  ? AST 18 12/31/2021  ? NA 141 12/31/2021  ? K 4.2 12/31/2021  ? CL 107 12/31/2021  ? CREATININE 1.06 12/31/2021  ? BUN 18 12/31/2021  ? CO2 26 12/31/2021  ? TSH 1.91 12/31/2021  ? INR 1.0 03/29/2021  ? HGBA1C 6.1 12/31/2021  ? ? ?MR ABDOMEN WWO CONTRAST ? ?Result Date: 12/16/2021 ?CLINICAL DATA:  Prior chemoembolization and thermal ablation of hepatocellular carcinoma. Chemoembolization 02/21/2016. Thermal ablation 03/21/2016. Segment 8 hepatocellular carcinoma. EXAM: MRI ABDOMEN WITHOUT AND WITH CONTRAST TECHNIQUE: Multiplanar multisequence MR imaging of the abdomen was performed both before and after the administration of intravenous contrast. CONTRAST:  35m MULTIHANCE GADOBENATE DIMEGLUMINE 529 MG/ML IV SOLN COMPARISON:  08/30/2021 FINDINGS: Lower chest: Normal heart size without pericardial or pleural effusion. Hepatobiliary: Suspect mild cirrhosis. The segment 6/8 ablation defect measures 2.6 x 2.3 cm on 37/13, similar to on the prior exam (when remeasured). Demonstrates precontrast heterogeneous T1 signal, but no post-contrast  enhancement, including on subtracted images. Segment 7 observation demonstrates T2 hyperintensity on 12/06. Arterial and persistent postcontrast enhancement, including at 6 mm on 23/11. Felt to be similar in size to on the prior exam (when remeasured). No delayed hypoenhancement or enhancing capsule. Cholecystectomy, without biliary ductal dilatation. Pancreas:  Normal, without mass or ductal dilatation. Spleen:  Normal in size, without focal abnormality. Adrenals/Urinary Tract: Normal adrenal glands. Normal kidneys,  without hydronephrosis. Stomach/Bowel: Normal stomach and small bowel. Scattered colonic diverticula. Vascular/Lymphatic: Aortic atherosclerosis. No retroperitoneal or retrocrural adenopathy. Other:  No ascites. Musculoskeletal: No acute osseous abnormality. IMPRESSION: 1. Similar appearance of ablation defect within segment 6/8, without local recurrence. 2. Suspect mild cirrhosis. 3. Similar size of a segment 7 observation which demonstrates arterial and persistent contrast enhancement. LR 3. 4.  Aortic Atherosclerosis (ICD10-I70.0). Electronically Signed   By: Abigail Miyamoto M.D.   On: 12/16/2021 11:39  ? ? ?Assessment & Plan:  ? ?Problem List Items Addressed This Visit   ? ? OBESITY, MORBID  ?  Pt lost wt ? ?  ?  ? Essential hypertension  ?  Cont on Losartan, Norvasc, Lasix ?  ?  ? Urinary frequency  ?  Check UA ?Will be on Flagyl ? ?  ?  ? Liver cirrhosis secondary to NASH [facility])  ?  F/u w/Dr Pyrtle, Dr Linus Salmons ?  ?  ? Gastritis  ?  Cont on Protonix ? ?  ?  ? Bloating - Primary  ?  New ?Labs ordered. Radiology tests reviewed ?Flagyl and Align Rx ?Try Gas-Ex ?Buy A2 milk ?RTC 6 wks ?  ?  ?  ? ? ?Meds ordered this encounter  ?Medications  ? metroNIDAZOLE (FLAGYL) 500 MG tablet  ?  Sig: Take 1 tablet (500 mg total) by mouth 3 (three) times daily.  ?  Dispense:  30 tablet  ?  Refill:  0  ? Probiotic Product (ALIGN) 4 MG CAPS  ?  Sig: Take 1 capsule (4 mg total) by mouth daily. Take for 1-2 months after you  finish Flagyl  ?  Dispense:  30 capsule  ?  Refill:  1  ?  ? ? ?Follow-up: Return in about 6 weeks (around 05/28/2022) for a follow-up visit. ? ?Walker Kehr, MD ?

## 2022-04-16 NOTE — Patient Instructions (Addendum)
Try Gas-Ex ?Buy A2 milk ?

## 2022-04-16 NOTE — Assessment & Plan Note (Signed)
Cont on Losartan, Norvasc, Lasix ?

## 2022-04-16 NOTE — Assessment & Plan Note (Addendum)
New ?Labs ordered. Radiology tests reviewed ?Flagyl and Align Rx ?Try Gas-Ex ?Buy A2 milk ?RTC 6 wks ?

## 2022-04-16 NOTE — Assessment & Plan Note (Signed)
F/u w/Dr Pyrtle, Dr Linus Salmons ?

## 2022-04-16 NOTE — Assessment & Plan Note (Signed)
Cont on Protonix 

## 2022-04-16 NOTE — Assessment & Plan Note (Signed)
Check UA ?Will be on Flagyl ?

## 2022-04-16 NOTE — Assessment & Plan Note (Signed)
Pt lost wt 

## 2022-04-22 DIAGNOSIS — H40051 Ocular hypertension, right eye: Secondary | ICD-10-CM | POA: Diagnosis not present

## 2022-05-02 ENCOUNTER — Other Ambulatory Visit: Payer: Self-pay | Admitting: Interventional Radiology

## 2022-05-02 DIAGNOSIS — C220 Liver cell carcinoma: Secondary | ICD-10-CM

## 2022-05-05 ENCOUNTER — Other Ambulatory Visit: Payer: Self-pay | Admitting: Nurse Practitioner

## 2022-05-05 DIAGNOSIS — C220 Liver cell carcinoma: Secondary | ICD-10-CM

## 2022-05-28 ENCOUNTER — Encounter: Payer: Self-pay | Admitting: Internal Medicine

## 2022-05-28 ENCOUNTER — Ambulatory Visit (INDEPENDENT_AMBULATORY_CARE_PROVIDER_SITE_OTHER): Payer: Medicare Other | Admitting: Internal Medicine

## 2022-05-28 DIAGNOSIS — K219 Gastro-esophageal reflux disease without esophagitis: Secondary | ICD-10-CM | POA: Diagnosis not present

## 2022-05-28 DIAGNOSIS — C220 Liver cell carcinoma: Secondary | ICD-10-CM | POA: Diagnosis not present

## 2022-05-28 DIAGNOSIS — G8929 Other chronic pain: Secondary | ICD-10-CM

## 2022-05-28 DIAGNOSIS — M5440 Lumbago with sciatica, unspecified side: Secondary | ICD-10-CM | POA: Diagnosis not present

## 2022-05-28 DIAGNOSIS — R140 Abdominal distension (gaseous): Secondary | ICD-10-CM

## 2022-05-28 DIAGNOSIS — R739 Hyperglycemia, unspecified: Secondary | ICD-10-CM | POA: Diagnosis not present

## 2022-05-28 NOTE — Progress Notes (Signed)
Subjective:  Patient ID: Samantha [name], female    DOB: Feb 26, 1951  Age: 71 y.o. MRN: [id number]  CC: No chief complaint on file.   HPI Samantha [name] presents for gas and bloating - resolved; liver cancer, HTN f/u  Outpatient Medications Prior to Visit  Medication Sig Dispense Refill   aspirin EC 81 MG tablet Take 81 mg by mouth daily.     BLACK COHOSH EXTRACT PO Take 1 tablet by mouth daily.     Cholecalciferol 25 MCG (1000 UT) tablet Take 1,000 Units by mouth daily.      EQ ALLERGY RELIEF 10 MG tablet Take 1 tablet by mouth once daily 90 tablet 3   furosemide (LASIX) 20 MG tablet Take 1-2 tablets (20-40 mg total) by mouth daily. 180 tablet 3   glucosamine-chondroitin 500-400 MG tablet Take 1 tablet by mouth daily.     losartan (COZAAR) 100 MG tablet Take 1 tablet (100 mg total) by mouth daily. 90 tablet 3   meloxicam (MOBIC) 7.5 MG tablet TAKE 1 TO 2 TABLETS BY MOUTH ONCE DAILY AS NEEDED FOR PAIN 60 tablet 3   metroNIDAZOLE (FLAGYL) 500 MG tablet Take 1 tablet (500 mg total) by mouth 3 (three) times daily. 30 tablet 0   Multiple Vitamin (MULTIVITAMIN WITH MINERALS) TABS tablet Take 1 tablet by mouth daily.      pantoprazole (PROTONIX) 40 MG tablet Take 1 tablet (40 mg total) by mouth daily. 90 tablet 3   potassium chloride SA (KLOR-CON) 20 MEQ tablet Take 1 tablet (20 mEq total) by mouth daily. 90 tablet 3   Probiotic Product (ALIGN) 4 MG CAPS Take 1 capsule (4 mg total) by mouth daily. Take for 1-2 months after you finish Flagyl 30 capsule 1   silver sulfADIAZINE (SILVADENE) 1 % cream Apply 1 application topically daily. 100 g 0   vitamin C (ASCORBIC ACID) 500 MG tablet Take 500 mg by mouth daily.     No facility-administered medications prior to visit.    ROS: Review of Systems  Constitutional:  Negative for activity change, appetite change, chills, fatigue and unexpected weight change.  HENT:  Negative for congestion, mouth sores and sinus pressure.   Eyes:   Negative for visual disturbance.  Respiratory:  Negative for cough and chest tightness.   Gastrointestinal:  Negative for abdominal pain and nausea.  Genitourinary:  Negative for difficulty urinating, frequency and vaginal pain.  Musculoskeletal:  Positive for arthralgias and back pain. Negative for gait problem.  Skin:  Negative for pallor and rash.  Neurological:  Negative for dizziness, tremors, weakness, numbness and headaches.  Psychiatric/Behavioral:  Negative for confusion and sleep disturbance.    Objective:  BP 130/78 (BP Location: Left Arm, Patient Position: Sitting, Cuff Size: Large)   Pulse 87   Temp 98.7 F (37.1 C) (Oral)   Ht '5\' 1"'$  (1.549 m)   Wt 286 lb (129.7 kg)   LMP  (LMP Unknown)   SpO2 96%   BMI 54.04 kg/m   BP Readings from Last 3 Encounters:  05/28/22 130/78  04/16/22 138/84  12/31/21 132/90    Wt Readings from Last 3 Encounters:  05/28/22 286 lb (129.7 kg)  04/16/22 284 lb (128.8 kg)  12/31/21 295 lb 9.6 oz (134.1 kg)    Physical Exam Constitutional:      General: She is not in acute distress.    Appearance: She is well-developed. She is obese.  HENT:     Head: Normocephalic.  Right Ear: External ear normal.     Left Ear: External ear normal.     Nose: Nose normal.  Eyes:     General:        Right eye: No discharge.        Left eye: No discharge.     Conjunctiva/sclera: Conjunctivae normal.     Pupils: Pupils are equal, round, and reactive to light.  Neck:     Thyroid: No thyromegaly.     Vascular: No JVD.     Trachea: No tracheal deviation.  Cardiovascular:     Rate and Rhythm: Normal rate and regular rhythm.     Heart sounds: Normal heart sounds.  Pulmonary:     Effort: No respiratory distress.     Breath sounds: No stridor. No wheezing.  Abdominal:     General: Bowel sounds are normal. There is no distension.     Palpations: Abdomen is soft. There is no mass.     Tenderness: There is no abdominal tenderness. There is no  guarding or rebound.  Musculoskeletal:        General: No tenderness.     Cervical back: Normal range of motion and neck supple. No rigidity.  Lymphadenopathy:     Cervical: No cervical adenopathy.  Skin:    Findings: No erythema or rash.  Neurological:     Cranial Nerves: No cranial nerve deficit.     Motor: No abnormal muscle tone.     Coordination: Coordination normal.     Deep Tendon Reflexes: Reflexes normal.  Psychiatric:        Behavior: Behavior normal.        Thought Content: Thought content normal.        Judgment: Judgment normal.    Lab Results  Component Value Date   WBC 3.8 (L) 04/16/2022   HGB 13.0 04/16/2022   HCT 40.3 04/16/2022   PLT 192.0 04/16/2022   GLUCOSE 106 (H) 04/16/2022   CHOL 156 05/22/2020   TRIG 93.0 05/22/2020   HDL 43.30 05/22/2020   LDLCALC 94 05/22/2020   ALT 11 04/16/2022   AST 21 04/16/2022   NA 139 04/16/2022   K 4.3 04/16/2022   CL 105 04/16/2022   CREATININE 0.91 04/16/2022   BUN 13 04/16/2022   CO2 27 04/16/2022   TSH 1.67 04/16/2022   INR 1.0 03/29/2021   HGBA1C 6.1 12/31/2021    MR ABDOMEN WWO CONTRAST  Result Date: 12/16/2021 CLINICAL DATA:  Prior chemoembolization and thermal ablation of hepatocellular carcinoma. Chemoembolization 02/21/2016. Thermal ablation 03/21/2016. Segment 8 hepatocellular carcinoma. EXAM: MRI ABDOMEN WITHOUT AND WITH CONTRAST TECHNIQUE: Multiplanar multisequence MR imaging of the abdomen was performed both before and after the administration of intravenous contrast. CONTRAST:  45m MULTIHANCE GADOBENATE DIMEGLUMINE 529 MG/ML IV SOLN COMPARISON:  08/30/2021 FINDINGS: Lower chest: Normal heart size without pericardial or pleural effusion. Hepatobiliary: Suspect mild cirrhosis. The segment 6/8 ablation defect measures 2.6 x 2.3 cm on 37/13, similar to on the prior exam (when remeasured). Demonstrates precontrast heterogeneous T1 signal, but no post-contrast enhancement, including on subtracted images.  Segment 7 observation demonstrates T2 hyperintensity on 12/06. Arterial and persistent postcontrast enhancement, including at 6 mm on 23/11. Felt to be similar in size to on the prior exam (when remeasured). No delayed hypoenhancement or enhancing capsule. Cholecystectomy, without biliary ductal dilatation. Pancreas:  Normal, without mass or ductal dilatation. Spleen:  Normal in size, without focal abnormality. Adrenals/Urinary Tract: Normal adrenal glands. Normal kidneys, without hydronephrosis. Stomach/Bowel: Normal stomach  and small bowel. Scattered colonic diverticula. Vascular/Lymphatic: Aortic atherosclerosis. No retroperitoneal or retrocrural adenopathy. Other:  No ascites. Musculoskeletal: No acute osseous abnormality. IMPRESSION: 1. Similar appearance of ablation defect within segment 6/8, without local recurrence. 2. Suspect mild cirrhosis. 3. Similar size of a segment 7 observation which demonstrates arterial and persistent contrast enhancement. LR 3. 4.  Aortic Atherosclerosis (ICD10-I70.0). Electronically Signed   By: Abigail Miyamoto M.D.   On: 12/16/2021 11:39    Assessment & Plan:   Problem List Items Addressed This Visit     Bloating    Resolved after Flagyl and Align Rx      GERD   Hepatocellular carcinoma ([place])    F/u w/Dr Laurence Ferrari and the abd MRI pending      Hyperglycemia    Monitoring glucose       Low back pain    Chronic LBP Meloxicam prn          No orders of the defined types were placed in this encounter.     Follow-up: Return in about 6 months (around 11/27/2022) for a follow-up visit.  Walker Kehr, MD

## 2022-05-28 NOTE — Assessment & Plan Note (Signed)
Monitoring glucose

## 2022-05-28 NOTE — Assessment & Plan Note (Signed)
F/u w/Dr Laurence Ferrari and the abd MRI pending

## 2022-05-28 NOTE — Assessment & Plan Note (Signed)
Chronic LBP Meloxicam prn

## 2022-05-28 NOTE — Assessment & Plan Note (Signed)
Resolved after Flagyl and Align Rx

## 2022-06-03 ENCOUNTER — Ambulatory Visit
Admission: RE | Admit: 2022-06-03 | Discharge: 2022-06-03 | Disposition: A | Discharge status: Home / Self Care | Payer: Medicare Other | Source: Ambulatory Visit | Attending: Nurse Practitioner | Admitting: Nurse Practitioner

## 2022-06-03 DIAGNOSIS — Z8505 Personal history of malignant neoplasm of liver: Secondary | ICD-10-CM | POA: Diagnosis not present

## 2022-06-03 DIAGNOSIS — C220 Liver cell carcinoma: Secondary | ICD-10-CM

## 2022-06-03 DIAGNOSIS — K769 Liver disease, unspecified: Secondary | ICD-10-CM | POA: Diagnosis not present

## 2022-06-03 DIAGNOSIS — Z9049 Acquired absence of other specified parts of digestive tract: Secondary | ICD-10-CM | POA: Diagnosis not present

## 2022-06-03 MED ORDER — GADOBENATE DIMEGLUMINE 529 MG/ML IV SOLN
17.0000 mL | Freq: Once | INTRAVENOUS | Status: AC | PRN
  Administered 2022-06-03: 17 mL via INTRAVENOUS

## 2022-06-04 ENCOUNTER — Encounter: Payer: Self-pay | Admitting: *Deleted

## 2022-06-04 ENCOUNTER — Ambulatory Visit
Admission: RE | Admit: 2022-06-04 | Discharge: 2022-06-04 | Disposition: A | Discharge status: Home / Self Care | Payer: Medicare Other | Source: Ambulatory Visit | Attending: Interventional Radiology | Admitting: Interventional Radiology

## 2022-06-04 DIAGNOSIS — C220 Liver cell carcinoma: Secondary | ICD-10-CM | POA: Diagnosis not present

## 2022-06-04 DIAGNOSIS — Z9889 Other specified postprocedural states: Secondary | ICD-10-CM | POA: Diagnosis not present

## 2022-06-04 HISTORY — PX: IR RADIOLOGIST EVAL & MGMT: IMG5224

## 2022-06-04 NOTE — Progress Notes (Signed)
Chief Complaint: Patient was consulted remotely today (TeleHealth) for hepatocellular cancer at the request of Makaio Mach K.    Referring Physician(s): Truitt Merle, MD  History of Present Illness: Samantha [name] is a 71 y.o. 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   She underwent conventional lipiodol transarterial chemo embolization on 2/23 and then subsequent CT guided thermal ablation of this lesion on 03/21/2016.   She presents today for continued follow-up evaluation.    MRI 08/29/21:  Stable ablation defect in anterior right hepatic lobe. No evidence of recurrent carcinoma.   4 mm focus of arterial phase hyperenhancement in the lateral dome of the right hepatic lobe appears slightly more conspicuous than on prior exam, but is difficult to characterize due to its small size. Recommend continued follow-up by abdomen MRI without and with contrast in 6 months.  MRI 11/1821: 1. Similar appearance of ablation defect within segment 6/8, without local recurrence. 2. Suspect mild cirrhosis. 3. Similar size of a segment 7 observation which demonstrates arterial and persistent contrast enhancement. LR 3.  MRI 06/03/22 1. Stable segment 8/5 ablation defect. No findings suspicious for recurrent tumor. 2. Interval enlargement of the segment 7 lesion now measuring 11 mm.  Interval growth suggests a LI-RADS 4 lesion. Recommend future studies on the 3T Skyra magnet for optimum evaluation.  She is feeling well and has no complaints including abdominal pain, nausea, vomiting, diarrhea, chest pain or shortness of breath.  Past Medical History:  Diagnosis Date   Allergy    seasonal   Cataract    bilateral - MD just watching   Cirrhosis ([place]) 2018   Diverticulosis    Edema    Elevated glucose    no longer an issue 03-18-16   Fatty liver 2017   GERD (gastroesophageal  reflux disease)    H/O seasonal allergies    Hepatitis C    hepatitis c completed  tx march 2017   Hyperlipidemia 2011   Hypertension    LBP (low back pain)    Liver cancer ([place]) 2017   IN REMISSION. WHEN IDAGNONED SIZE OF GRAPEFRUIT    Morbid obesity ([place])    Osteoarthritis    B knees   Right middle lobe pulmonary nodule    resolved 2018   Uterine fibroid    Wears glasses     Past Surgical History:  Procedure Laterality Date   BIOPSY  07/04/2019   Procedure: BIOPSY;  Surgeon: Jerene Bears, MD;  Location: [facility];  Service: Gastroenterology;;   BIOPSY  07/11/2020   Procedure: BIOPSY;  Surgeon: Lavena Bullion, DO;  Location: [facility];  Service: Gastroenterology;;   CHOLECYSTECTOMY     COLONOSCOPY N/A 01/09/2015   Procedure: COLONOSCOPY;  Surgeon: Jerene Bears, MD;  Location: [facility];  Service: Gastroenterology;  Laterality: N/A;   COLONOSCOPY WITH PROPOFOL N/A 06/06/2020   Procedure: COLONOSCOPY WITH PROPOFOL;  Surgeon: Lavena Bullion, DO;  Location: [facility];  Service: Gastroenterology;  Laterality: N/A;   COLONOSCOPY WITH PROPOFOL N/A 07/11/2020   Procedure: COLONOSCOPY WITH PROPOFOL;  Surgeon: Lavena Bullion, DO;  Location: [facility];  Service: Gastroenterology;  Laterality: N/A;   ESOPHAGOGASTRODUODENOSCOPY (EGD) WITH PROPOFOL N/A 06/10/2016   Procedure: ESOPHAGOGASTRODUODENOSCOPY (EGD) WITH PROPOFOL;  Surgeon: Jerene Bears, MD;  Location: [facility];  Service: Gastroenterology;  Laterality: N/A;   ESOPHAGOGASTRODUODENOSCOPY (EGD) WITH PROPOFOL N/A 07/04/2019   Procedure: ESOPHAGOGASTRODUODENOSCOPY (EGD) WITH  PROPOFOL;  Surgeon: Jerene Bears, MD;  Location: [facility];  Service: Gastroenterology;  Laterality: N/A;   HYSTEROSCOPY WITH D & C N/A 10/15/2018   Procedure: DILATATION AND CURETTAGE /HYSTEROSCOPY;  Surgeon: Aloha Gell, MD;  Location: [facility];  Service: Gynecology;  Laterality: N/A;   IR GENERIC HISTORICAL  07/03/2016   IR RADIOLOGIST  EVAL & MGMT 07/03/2016 [name], MD [facility]   IR GENERIC HISTORICAL  11/18/2016   IR RADIOLOGIST EVAL & MGMT 11/18/2016 [name], MD [facility]   IR RADIOLOGIST EVAL & MGMT  04/09/2017   IR RADIOLOGIST EVAL & MGMT  11/05/2017   IR RADIOLOGIST EVAL & MGMT  03/13/2020   IR RADIOLOGIST EVAL & MGMT  08/30/2020   IR RADIOLOGIST EVAL & MGMT  04/10/2021   JOINT REPLACEMENT     BILATERAL KNEES   left shoulder arthroplasty     POLYPECTOMY  06/06/2020   Procedure: POLYPECTOMY;  Surgeon: Lavena Bullion, DO;  Location: [facility];  Service: Gastroenterology;;   POLYPECTOMY  07/11/2020   Procedure: POLYPECTOMY;  Surgeon: Lavena Bullion, DO;  Location: [facility];  Service: Gastroenterology;;   right knee replacement  2007   total knee replacement L  03-13-08   TUBAL LIGATION      Allergies: Amlodipine, Ace inhibitors, Benazepril hcl, and Codeine  Medications: Prior to Admission medications   Medication Sig Start Date End Date Taking? Authorizing Provider  aspirin EC 81 MG tablet Take 81 mg by mouth daily.    [provider]  BLACK COHOSH EXTRACT PO Take 1 tablet by mouth daily.    [provider]  Cholecalciferol 25 MCG (1000 UT) tablet Take 1,000 Units by mouth daily.     [provider]  EQ ALLERGY RELIEF 10 MG tablet Take 1 tablet by mouth once daily 07/02/21   Plotnikov, Evie Lacks, MD  furosemide (LASIX) 20 MG tablet Take 1-2 tablets (20-40 mg total) by mouth daily. 08/28/21   Plotnikov, Evie Lacks, MD  glucosamine-chondroitin 500-400 MG tablet Take 1 tablet by mouth daily.    [provider]  losartan (COZAAR) 100 MG tablet Take 1 tablet (100 mg total) by mouth daily. 08/28/21   Plotnikov, Evie Lacks, MD  meloxicam (MOBIC) 7.5 MG tablet TAKE 1 TO 2 TABLETS BY MOUTH ONCE DAILY AS NEEDED FOR PAIN 08/28/21   Plotnikov, Evie Lacks, MD  Multiple Vitamin (MULTIVITAMIN WITH MINERALS) TABS tablet Take 1 tablet by mouth daily.      [provider]  pantoprazole (PROTONIX) 40 MG tablet Take 1 tablet (40 mg total) by mouth daily. 08/28/21   Plotnikov, Evie Lacks, MD  potassium chloride SA (KLOR-CON) 20 MEQ tablet Take 1 tablet (20 mEq total) by mouth daily. 08/28/21   Plotnikov, Evie Lacks, MD  silver sulfADIAZINE (SILVADENE) 1 % cream Apply 1 application topically daily. 09/09/21   Jaynee Eagles, PA-C  vitamin C (ASCORBIC ACID) 500 MG tablet Take 500 mg by mouth daily.    [provider]  promethazine (PHENERGAN) 12.5 MG tablet Take 1 tablet (12.5 mg total) by mouth every 6 (six) hours as needed for nausea or vomiting. 08/09/19 09/27/20  Janith Lima, MD     Family History  Problem Relation Age of Onset   Hypertension Mother    Ovarian cancer Mother    Hypertension Father    Cancer Father 81       prostate cancer    Diabetes Father    Diabetes Other  Hypertension Other    Colon cancer Neg Hx    Esophageal cancer Neg Hx    Rectal cancer Neg Hx    Stomach cancer Neg Hx    Breast cancer Neg Hx     Social History   Socioeconomic History   Marital status: Single    Spouse name: Not on file   Number of children: 4   Years of education: Not on file   Highest education level: Not on file  Occupational History   Not on file  Tobacco Use   Smoking status: Former    Packs/day: 0.25    Years: 20.00    Pack years: 5.00    Types: Cigarettes    Quit date: 12/30/2007    Years since quitting: 14.4   Smokeless tobacco: Never  Vaping Use   Vaping Use: Never used  Substance and Sexual Activity   Alcohol use: No    Comment: 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, quit drinking alcohol in 10/2015    Drug use: No   Sexual activity: Not Currently    Birth control/protection: Post-menopausal  Other Topics Concern   Not on file  Social History Narrative   Regular exercise: walk a few times a week   Caffeine use: none   Single, raising her young great granddaughter and watches grandchildren  after school   Has a room mate to help prn   Retired from nursing tech--worked at [employer]   Independent/drives   Social Determinants of Health   Financial Resource Strain: Low Risk    Difficulty of Paying Living Expenses: Not hard at all  Food Insecurity: No Food Insecurity   Worried About Charity fundraiser in the Last Year: Never true   Arboriculturist in the Last Year: Never true  Transportation Needs: No Transportation Needs   Lack of Transportation (Medical): No   Lack of Transportation (Non-Medical): No  Physical Activity: Sufficiently Active   Days of Exercise per Week: 5 days   Minutes of Exercise per Session: 30 min  Stress: No Stress Concern Present   Feeling of Stress : Not at all  Social Connections: Moderately Isolated   Frequency of Communication with Friends and Family: Three times a week   Frequency of Social Gatherings with Friends and Family: Three times a week   Attends Religious Services: More than 4 times per year   Active Member of Clubs or Organizations: No   Attends Archivist Meetings: Never   Marital Status: Never married    ECOG Status: 0 - Asymptomatic  Review of Systems  Review of Systems: A 12 point ROS discussed and pertinent positives are indicated in the HPI above.  All other systems are negative.  Physical Exam No direct physical exam was performed (except for noted visual exam findings with Video Visits).    Vital Signs: LMP  (LMP Unknown)   Imaging: MR ABDOMEN WWO CONTRAST  Result Date: 06/04/2022 CLINICAL DATA:  History of hepatocellular carcinoma. Prior chemoembolization and thermal ablation of segment 8 liver lesion. EXAM: MRI ABDOMEN WITHOUT AND WITH CONTRAST TECHNIQUE: Multiplanar multisequence MR imaging of the abdomen was performed both before and after the administration of intravenous contrast. CONTRAST:  46m MULTIHANCE GADOBENATE DIMEGLUMINE 529 MG/ML IV SOLN COMPARISON:  Numerous prior MR examinations.  The most recent study is 12/15/2021 FINDINGS: Examination is somewhat limited by artifact. I think this is related to the short bore Espree 1.5 scanner. Lower chest: The  lung bases are clear of an acute process. No pleural effusions or pulmonary lesions. No pericardial effusion. Hepatobiliary: Stable segment 8/5 ablation defect. No findings suspicious for recurrent tumor. Early arterial phase enhancing lesion in segment 7 appears slightly larger now measuring close to 11 mm. It also fades quickly which it did not do on the prior study. It is somewhat more positive now on diffusion-weighted images. The interval growth would suggest a LI-RADS 4 lesion. No new hepatic lesions. No intrahepatic biliary dilatation. The gallbladder is surgically absent. No common bile duct dilatation. Pancreas:  No mass, inflammation or ductal dilatation. Spleen:  Normal size.  No focal lesions. Adrenals/Urinary Tract:  The adrenal glands and kidneys are normal. Stomach/Bowel: The stomach, duodenum, visualized small bowel and visualized colon are unremarkable. Vascular/Lymphatic: The aorta and branch vessels are patent. The major venous structures are patent. No mesenteric or retroperitoneal mass or adenopathy. Other:  No ascites. Musculoskeletal: No significant bony findings. IMPRESSION: 1. Stable segment 8/5 ablation defect. No findings suspicious for recurrent tumor. 2. Interval enlargement of the segment 7 lesion now measuring 11 mm. Interval growth suggests a LI-RADS 4 lesion. Recommend future studies on the 3T Skyra magnet for optimum evaluation 3. No new hepatic lesions. Electronically Signed   By: Marijo Sanes M.D.   On: 06/04/2022 10:40    Labs:  CBC: Recent Labs    04/16/22 1022  WBC 3.8*  HGB 13.0  HCT 40.3  PLT 192.0    COAGS: No results for input(s): INR, APTT in the last 8760 hours.  BMP: Recent Labs    12/31/21 1005 04/16/22 1022  NA 141 139  K 4.2 4.3  CL 107 105  CO2 26 27  GLUCOSE 103* 106*   BUN 18 13  CALCIUM 9.3 9.6  CREATININE 1.06 0.91    LIVER FUNCTION TESTS: Recent Labs    12/31/21 1005 04/16/22 1022  BILITOT 0.4 0.4  AST 18 21  ALT 11 11  ALKPHOS 80 92  PROT 7.4 7.8  ALBUMIN 4.1 4.4    TUMOR MARKERS: No results for input(s): AFPTM, CEA, CA199, CHROMGRNA in the last 8760 hours.  Assessment and Plan:  70 year old female doing well 6 years status post combined transarterial chemoembolization and percutaneous microwave ablation of hepatocellular carcinoma.  Her most recent MR imaging demonstrates an interval change in the size and imaging characteristics of the tiny LR 3 lesion in the periphery of the right hemiliver.  The lesion remains small at 1 cm in size but the imaging characteristics are becoming more concerning.  This lesion now warrants close surveillance as it poses a real risk of declaring itself as a new focus of hepatocellular carcinoma.  1.)  MRI with gadolinium contrast to be performed on the 3T Skyra magnet (recommended by diagnostic radiologist), liver labs including AFP and clinic visit in 3 months (September 2023).      Electronically Signed: Antonietta Jewel Bayden Gil 06/04/2022, 11:02 AM   I spent a total of  15 Minutes in remote  clinical consultation, greater than 50% of which was counseling/coordinating care for hepatocellular cancer.    Visit type: Audio only (telephone). Audio (no video) only due to patient preference. Alternative for in-person consultation at [facility], [address] [place], [place], [place]. This visit type was conducted due to national recommendations for restrictions regarding the COVID-19 Pandemic (e.g. social distancing).  This format is felt to be most appropriate for this patient at this time.  All issues noted in this document were discussed  and addressed.

## 2022-06-16 ENCOUNTER — Other Ambulatory Visit: Payer: Self-pay | Admitting: Internal Medicine

## 2022-06-16 DIAGNOSIS — E2839 Other primary ovarian failure: Secondary | ICD-10-CM

## 2022-06-17 ENCOUNTER — Other Ambulatory Visit: Payer: Self-pay | Admitting: Internal Medicine

## 2022-06-17 DIAGNOSIS — Z1231 Encounter for screening mammogram for malignant neoplasm of breast: Secondary | ICD-10-CM

## 2022-06-20 ENCOUNTER — Ambulatory Visit
Admission: RE | Admit: 2022-06-20 | Discharge: 2022-06-20 | Disposition: A | Discharge status: Home / Self Care | Payer: Medicare Other | Source: Ambulatory Visit | Attending: Internal Medicine | Admitting: Internal Medicine

## 2022-06-20 DIAGNOSIS — Z780 Asymptomatic menopausal state: Secondary | ICD-10-CM | POA: Diagnosis not present

## 2022-06-20 DIAGNOSIS — E2839 Other primary ovarian failure: Secondary | ICD-10-CM

## 2022-07-18 ENCOUNTER — Ambulatory Visit
Admission: RE | Admit: 2022-07-18 | Discharge: 2022-07-18 | Disposition: A | Discharge status: Home / Self Care | Payer: Medicare Other | Source: Ambulatory Visit | Attending: Internal Medicine | Admitting: Internal Medicine

## 2022-07-18 DIAGNOSIS — Z1231 Encounter for screening mammogram for malignant neoplasm of breast: Secondary | ICD-10-CM

## 2022-07-24 ENCOUNTER — Other Ambulatory Visit: Payer: Self-pay | Admitting: Internal Medicine

## 2022-08-12 ENCOUNTER — Other Ambulatory Visit: Payer: Self-pay | Admitting: *Deleted

## 2022-08-12 DIAGNOSIS — K766 Portal hypertension: Secondary | ICD-10-CM

## 2022-08-22 ENCOUNTER — Other Ambulatory Visit: Payer: Self-pay | Admitting: Interventional Radiology

## 2022-08-22 ENCOUNTER — Other Ambulatory Visit: Payer: Self-pay | Admitting: Nurse Practitioner

## 2022-08-22 DIAGNOSIS — C220 Liver cell carcinoma: Secondary | ICD-10-CM

## 2022-09-03 ENCOUNTER — Other Ambulatory Visit: Payer: Self-pay | Admitting: Interventional Radiology

## 2022-09-03 DIAGNOSIS — C220 Liver cell carcinoma: Secondary | ICD-10-CM

## 2022-09-04 ENCOUNTER — Other Ambulatory Visit: Payer: Self-pay | Admitting: Nurse Practitioner

## 2022-09-04 DIAGNOSIS — C220 Liver cell carcinoma: Secondary | ICD-10-CM

## 2022-09-12 ENCOUNTER — Ambulatory Visit (INDEPENDENT_AMBULATORY_CARE_PROVIDER_SITE_OTHER): Payer: Medicare Other | Admitting: *Deleted

## 2022-09-12 DIAGNOSIS — K766 Portal hypertension: Secondary | ICD-10-CM | POA: Diagnosis not present

## 2022-09-12 DIAGNOSIS — Z23 Encounter for immunization: Secondary | ICD-10-CM

## 2022-09-12 NOTE — Progress Notes (Signed)
flu

## 2022-09-15 LAB — COMPLETE METABOLIC PANEL WITH GFR
AG Ratio: 1.5 (calc) (ref 1.0–2.5)
ALT: 8 U/L (ref 6–29)
AST: 14 U/L (ref 10–35)
Albumin: 4.2 g/dL (ref 3.6–5.1)
Alkaline phosphatase (APISO): 84 U/L (ref 37–153)
BUN/Creatinine Ratio: 16 (calc) (ref 6–22)
BUN: 16 mg/dL (ref 7–25)
CO2: 24 mmol/L (ref 20–32)
Calcium: 9 mg/dL (ref 8.6–10.4)
Chloride: 110 mmol/L (ref 98–110)
Creat: 1.01 mg/dL — ABNORMAL HIGH (ref 0.60–1.00)
Globulin: 2.8 g/dL (calc) (ref 1.9–3.7)
Glucose, Bld: 98 mg/dL (ref 65–99)
Potassium: 4.4 mmol/L (ref 3.5–5.3)
Sodium: 144 mmol/L (ref 135–146)
Total Bilirubin: 0.4 mg/dL (ref 0.2–1.2)
Total Protein: 7 g/dL (ref 6.1–8.1)
eGFR: 59 mL/min/{1.73_m2} — ABNORMAL LOW (ref >=60)

## 2022-09-15 LAB — CBC
HCT: 39.1 % (ref 35.0–45.0)
Hemoglobin: 12.5 g/dL (ref 11.7–15.5)
MCH: 27.4 pg (ref 27.0–33.0)
MCHC: 32 g/dL (ref 32.0–36.0)
MCV: 85.6 fL (ref 80.0–100.0)
MPV: 10.3 fL (ref 7.5–12.5)
Platelets: 205 10*3/uL (ref 140–400)
RBC: 4.57 10*6/uL (ref 3.80–5.10)
RDW: 12.7 % (ref 11.0–15.0)
WBC: 3.3 10*3/uL — ABNORMAL LOW (ref 3.8–10.8)

## 2022-09-15 LAB — PROTIME-INR
INR: 1
Prothrombin Time: 10.4 s (ref 9.0–11.5)

## 2022-09-15 LAB — AFP TUMOR MARKER: AFP-Tumor Marker: 10.1 ng/mL — ABNORMAL HIGH

## 2022-09-22 ENCOUNTER — Ambulatory Visit (HOSPITAL_COMMUNITY)
Admission: RE | Admit: 2022-09-22 | Discharge: 2022-09-22 | Disposition: A | Discharge status: Home / Self Care | Payer: Medicare Other | Source: Ambulatory Visit | Attending: Interventional Radiology | Admitting: Interventional Radiology

## 2022-09-22 DIAGNOSIS — Z9049 Acquired absence of other specified parts of digestive tract: Secondary | ICD-10-CM | POA: Diagnosis not present

## 2022-09-22 DIAGNOSIS — C220 Liver cell carcinoma: Secondary | ICD-10-CM | POA: Insufficient documentation

## 2022-09-22 DIAGNOSIS — K7460 Unspecified cirrhosis of liver: Secondary | ICD-10-CM | POA: Diagnosis not present

## 2022-09-22 DIAGNOSIS — I700 Atherosclerosis of aorta: Secondary | ICD-10-CM | POA: Diagnosis not present

## 2022-09-22 DIAGNOSIS — R590 Localized enlarged lymph nodes: Secondary | ICD-10-CM | POA: Diagnosis not present

## 2022-09-22 MED ORDER — GADOPICLENOL 0.5 MMOL/ML IV SOLN
10.0000 mL | Freq: Once | INTRAVENOUS | Status: AC | PRN
  Administered 2022-09-22: 10 mL via INTRAVENOUS

## 2022-09-23 ENCOUNTER — Encounter: Payer: Self-pay | Admitting: *Deleted

## 2022-09-23 ENCOUNTER — Ambulatory Visit
Admission: RE | Admit: 2022-09-23 | Discharge: 2022-09-23 | Disposition: A | Discharge status: Home / Self Care | Payer: Medicare Other | Source: Ambulatory Visit | Attending: Interventional Radiology | Admitting: Interventional Radiology

## 2022-09-23 DIAGNOSIS — C220 Liver cell carcinoma: Secondary | ICD-10-CM | POA: Diagnosis not present

## 2022-09-23 DIAGNOSIS — K7460 Unspecified cirrhosis of liver: Secondary | ICD-10-CM | POA: Diagnosis not present

## 2022-09-23 HISTORY — PX: IR RADIOLOGIST EVAL & MGMT: IMG5224

## 2022-09-23 NOTE — Progress Notes (Signed)
Chief Complaint: Patient was consulted remotely today (TeleHealth) for cirrhosis complicated by [facility] at the request of Tasmine Hipwell K.    Referring Physician(s): Truitt Merle, MD  History of Present Illness: Samantha [name] is a 71 y.o. 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   She underwent conventional lipiodol transarterial chemo embolization on 2/23 and then subsequent CT guided thermal ablation of this lesion on 03/21/2016.   She presents today for continued follow-up evaluation.     MRI 08/29/21:  Stable ablation defect in anterior right hepatic lobe. No evidence of recurrent carcinoma.   4 mm focus of arterial phase hyperenhancement in the lateral dome of the right hepatic lobe appears slightly more conspicuous than on prior exam, but is difficult to characterize due to its small size. Recommend continued follow-up by abdomen MRI without and with contrast in 6 months.   MRI 11/1821: 1. Similar appearance of ablation defect within segment 6/8, without local recurrence. 2. Suspect mild cirrhosis. 3. Similar size of a segment 7 observation which demonstrates arterial and persistent contrast enhancement. LR 3.   MRI 06/03/22 1. Stable segment 8/5 ablation defect. No findings suspicious for recurrent tumor. 2. Interval enlargement of the segment 7 lesion now measuring 11 mm.  Interval growth suggests a LI-RADS 4 lesion. Recommend future studies on the 3T Skyra magnet for optimum evaluation.  MRI 09/22/22 1. The segment 7 1.5 cm observation demonstrates arterial phase non rim enhancement and threshold growth. Considered LR 5.   She is feeling well and has no complaints including abdominal pain, nausea, vomiting, diarrhea, chest pain or shortness of breath.  Past Medical History:  Diagnosis Date   Allergy    seasonal   Cataract    bilateral - MD just watching    Cirrhosis ([place]) 2018   Diverticulosis    Edema    Elevated glucose    no longer an issue 03-18-16   Fatty liver 2017   GERD (gastroesophageal reflux disease)    H/O seasonal allergies    Hepatitis C    hepatitis c completed  tx march 2017   Hyperlipidemia 2011   Hypertension    LBP (low back pain)    Liver cancer ([place]) 2017   IN REMISSION. WHEN IDAGNONED SIZE OF GRAPEFRUIT    Morbid obesity ([place])    Osteoarthritis    B knees   Right middle lobe pulmonary nodule    resolved 2018   Uterine fibroid    Wears glasses     Past Surgical History:  Procedure Laterality Date   BIOPSY  07/04/2019   Procedure: BIOPSY;  Surgeon: Jerene Bears, MD;  Location: [facility];  Service: Gastroenterology;;   BIOPSY  07/11/2020   Procedure: BIOPSY;  Surgeon: Lavena Bullion, DO;  Location: [facility];  Service: Gastroenterology;;   CHOLECYSTECTOMY     COLONOSCOPY N/A 01/09/2015   Procedure: COLONOSCOPY;  Surgeon: Jerene Bears, MD;  Location: [facility];  Service: Gastroenterology;  Laterality: N/A;   COLONOSCOPY WITH PROPOFOL N/A 06/06/2020   Procedure: COLONOSCOPY WITH PROPOFOL;  Surgeon: Lavena Bullion, DO;  Location: [facility];  Service: Gastroenterology;  Laterality: N/A;   COLONOSCOPY WITH PROPOFOL N/A 07/11/2020   Procedure: COLONOSCOPY WITH PROPOFOL;  Surgeon: Lavena Bullion, DO;  Location: [facility];  Service: Gastroenterology;  Laterality: N/A;   ESOPHAGOGASTRODUODENOSCOPY (EGD) WITH PROPOFOL N/A 06/10/2016   Procedure: ESOPHAGOGASTRODUODENOSCOPY (EGD) WITH PROPOFOL;  Surgeon:  Jerene Bears, MD;  Location: [facility];  Service: Gastroenterology;  Laterality: N/A;   ESOPHAGOGASTRODUODENOSCOPY (EGD) WITH PROPOFOL N/A 07/04/2019   Procedure: ESOPHAGOGASTRODUODENOSCOPY (EGD) WITH PROPOFOL;  Surgeon: Jerene Bears, MD;  Location: [facility];  Service: Gastroenterology;  Laterality: N/A;   HYSTEROSCOPY WITH D & C N/A 10/15/2018   Procedure: DILATATION AND CURETTAGE  /HYSTEROSCOPY;  Surgeon: Aloha Gell, MD;  Location: [facility];  Service: Gynecology;  Laterality: N/A;   IR GENERIC HISTORICAL  07/03/2016   IR RADIOLOGIST EVAL & MGMT 07/03/2016 [name], MD [facility]   IR GENERIC HISTORICAL  11/18/2016   IR RADIOLOGIST EVAL & MGMT 11/18/2016 [name], MD [facility]   IR RADIOLOGIST EVAL & MGMT  04/09/2017   IR RADIOLOGIST EVAL & MGMT  11/05/2017   IR RADIOLOGIST EVAL & MGMT  03/13/2020   IR RADIOLOGIST EVAL & MGMT  08/30/2020   IR RADIOLOGIST EVAL & MGMT  04/10/2021   IR RADIOLOGIST EVAL & MGMT  06/04/2022   JOINT REPLACEMENT     BILATERAL KNEES   left shoulder arthroplasty     POLYPECTOMY  06/06/2020   Procedure: POLYPECTOMY;  Surgeon: Lavena Bullion, DO;  Location: [facility];  Service: Gastroenterology;;   POLYPECTOMY  07/11/2020   Procedure: POLYPECTOMY;  Surgeon: Lavena Bullion, DO;  Location: [facility];  Service: Gastroenterology;;   right knee replacement  2007   total knee replacement L  03-13-08   TUBAL LIGATION      Allergies: Amlodipine, Ace inhibitors, Benazepril hcl, and Codeine  Medications: Prior to Admission medications   Medication Sig Start Date End Date Taking? Authorizing Provider  aspirin EC 81 MG tablet Take 81 mg by mouth daily.    [provider]  BLACK COHOSH EXTRACT PO Take 1 tablet by mouth daily.    [provider]  Cholecalciferol 25 MCG (1000 UT) tablet Take 1,000 Units by mouth daily.     [provider]  furosemide (LASIX) 20 MG tablet Take 1-2 tablets (20-40 mg total) by mouth daily. 08/28/21   Plotnikov, Evie Lacks, MD  glucosamine-chondroitin 500-400 MG tablet Take 1 tablet by mouth daily.    [provider]  loratadine (EQ ALLERGY RELIEF) 10 MG tablet Take 1 tablet by mouth once daily 07/24/22   Plotnikov, Evie Lacks, MD  losartan (COZAAR) 100 MG tablet Take 1 tablet (100 mg total) by mouth daily. 08/28/21   Plotnikov, Evie Lacks, MD  meloxicam  (MOBIC) 7.5 MG tablet TAKE 1 TO 2 TABLETS BY MOUTH ONCE DAILY AS NEEDED FOR PAIN 08/28/21   Plotnikov, Evie Lacks, MD  Multiple Vitamin (MULTIVITAMIN WITH MINERALS) TABS tablet Take 1 tablet by mouth daily.     [provider]  pantoprazole (PROTONIX) 40 MG tablet Take 1 tablet (40 mg total) by mouth daily. 08/28/21   Plotnikov, Evie Lacks, MD  potassium chloride SA (KLOR-CON) 20 MEQ tablet Take 1 tablet (20 mEq total) by mouth daily. 08/28/21   Plotnikov, Evie Lacks, MD  silver sulfADIAZINE (SILVADENE) 1 % cream Apply 1 application topically daily. 09/09/21   Jaynee Eagles, PA-C  vitamin C (ASCORBIC ACID) 500 MG tablet Take 500 mg by mouth daily.    [provider]  promethazine (PHENERGAN) 12.5 MG tablet Take 1 tablet (12.5 mg total) by mouth every 6 (six) hours as needed for nausea or vomiting. 08/09/19 09/27/20  Janith Lima, MD     Family History  Problem Relation Age of Onset   Hypertension Mother  Ovarian cancer Mother    Hypertension Father    Cancer Father 12       prostate cancer    Diabetes Father    Diabetes Other    Hypertension Other    Colon cancer Neg Hx    Esophageal cancer Neg Hx    Rectal cancer Neg Hx    Stomach cancer Neg Hx    Breast cancer Neg Hx     Social History   Socioeconomic History   Marital status: Single    Spouse name: Not on file   Number of children: 4   Years of education: Not on file   Highest education level: Not on file  Occupational History   Not on file  Tobacco Use   Smoking status: Former    Packs/day: 0.25    Years: 20.00    Total pack years: 5.00    Types: Cigarettes    Quit date: 12/30/2007    Years since quitting: 14.7   Smokeless tobacco: Never  Vaping Use   Vaping Use: Never used  Substance and Sexual Activity   Alcohol use: No    Comment: 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, quit drinking alcohol in 10/2015    Drug use: No   Sexual activity: Not Currently    Birth control/protection:  Post-menopausal  Other Topics Concern   Not on file  Social History Narrative   Regular exercise: walk a few times a week   Caffeine use: none   Single, raising her young great granddaughter and watches grandchildren after school   Has a room mate to help prn   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 [employer] Strain: Low Risk  (01/17/2022)   Overall Financial Resource Strain (CARDIA)    Difficulty of Paying Living Expenses: Not hard at all  Food Insecurity: No Food Insecurity (01/17/2022)   Hunger Vital Sign    Worried About Running Out of Food in the Last Year: Never true    Ran Out of Food in the Last Year: Never true  Transportation Needs: No Transportation Needs (01/17/2022)   PRAPARE - Hydrologist (Medical): No    Lack of Transportation (Non-Medical): No  Physical Activity: Sufficiently Active (01/17/2022)   Exercise Vital Sign    Days of Exercise per Week: 5 days    Minutes of Exercise per Session: 30 min  Stress: No Stress Concern Present (01/17/2022)   [place]    Feeling of Stress : Not at all  Social Connections: Moderately Isolated (01/17/2022)   Social Connection and Isolation Panel [NHANES]    Frequency of Communication with Friends and Family: Three times a week    Frequency of Social Gatherings with Friends and Family: Three times a week    Attends Religious Services: More than 4 times per year    Active Member of Clubs or Organizations: No    Attends Archivist Meetings: Never    Marital Status: Never married    ECOG Status: 0 - Asymptomatic  Review of Systems  Review of Systems: A 12 point ROS discussed and pertinent positives are indicated in the HPI above.  All other systems are negative.    Physical Exam No direct physical exam was performed (except for noted visual  exam findings with Video Visits).    Vital Signs: LMP  (LMP  Unknown)   Imaging: MR ABDOMEN W WO CONTRAST  Result Date: 09/23/2022 CLINICAL DATA:  Cirrhosis with history of hepatocellular carcinoma treated 6 years ago with chemoembolization. Follow-up of liver lesion EXAM: MRI ABDOMEN WITHOUT AND WITH CONTRAST TECHNIQUE: Multiplanar multisequence MR imaging of the abdomen was performed both before and after the administration of intravenous contrast. CONTRAST:  10 cc Vueway COMPARISON:  06/03/2022 FINDINGS: Lower chest: Mild cardiomegaly, without pericardial or pleural effusion. A right cardiophrenic angle mildly enlarged node of 6 mm is similar and likely reactive. Hepatobiliary: Moderate cirrhosis. The ablation defect within the peripheral portion of segment 8 and less so 5 demonstrates no evidence of local recurrence. Measures on the order of 2.6 x 2.2 cm on 53/803. Slightly decreased from 3.2 x 2.1 cm on the prior exam. Again identified is an observation within segment 7. Measures 1.5 x 1.0 cm on 36/801. Compare 1.0 x 0.9 cm on the prior exam (when remeasured). This demonstrates T2 hyperintensity on 09/04, restricted diffusion on 09/550. Arterial phase non rim enhancement including on 35/802. Delayed phase equivocal washout medially including on 33/804. No enhancing capsule. No new lesion. Cholecystectomy, without biliary ductal dilatation. Pancreas:  Normal, without mass or ductal dilatation. Spleen:  Normal in size, without focal abnormality. Adrenals/Urinary Tract: Normal adrenal glands. Tiny lesions in both kidneys are too small to characterize but most likely cysts. No hydronephrosis. Stomach/Bowel: Normal stomach and small bowel. Scattered colonic diverticula. Vascular/Lymphatic: Aortic atherosclerosis. No splenic artery aneurysm. Patent portal and hepatic veins. No specific evidence of portal venous hypertension. No retroperitoneal or retrocrural adenopathy. Other:  No ascites. Musculoskeletal: No  acute osseous abnormality. IMPRESSION: 1. The segment 7 1.5 cm observation demonstrates arterial phase non rim enhancement and threshold growth. Considered LR 5. 2. No evidence of local recurrence at the ablation site. 3. No evidence of abdominal metastatic disease. Electronically Signed   By: Abigail Miyamoto M.D.   On: 09/23/2022 09:08    Labs:  CBC: Recent Labs    04/16/22 1022 09/12/22 1116  WBC 3.8* 3.3*  HGB 13.0 12.5  HCT 40.3 39.1  PLT 192.0 205    COAGS: Recent Labs    09/12/22 1116  INR 1.0    BMP: Recent Labs    12/31/21 1005 04/16/22 1022 09/12/22 1116  NA 141 139 144  K 4.2 4.3 4.4  CL 107 105 110  CO2 26 27 24   GLUCOSE 103* 106* 98  BUN 18 13 16   CALCIUM 9.3 9.6 9.0  CREATININE 1.06 0.91 1.01*    LIVER FUNCTION TESTS: Recent Labs    12/31/21 1005 04/16/22 1022 09/12/22 1116  BILITOT 0.4 0.4 0.4  AST 18 21 14   ALT 11 11 8   ALKPHOS 80 92  --   PROT 7.4 7.8 7.0  ALBUMIN 4.1 4.4  --     TUMOR MARKERS: Recent Labs    09/12/22 1116  AFPTM 10.1*    Assessment and Plan:  Unfortunately, after 6 years it appears that Mrs. Mendel Ryder has developed a second hepatocellular carcinoma.  This is located remote to her previous biopsy-proven HCC which remains nonviable.  Her new lesion measures up to 1.5 cm and demonstrates imaging features consistent/diagnostic for hepatocellular carcinoma.  She remains a candidate for liver directed therapy.  Given her body habitus and expected challenges with imaging localization I believe Ms. Mendel Ryder would benefit most from concurrent transarterial chemoembolization plus microwave ablation.  1.) Please schedule for combined DEB-TACE and MWA under general anesthesia at [facility].  We will need  the [employer] reps present to allow for advanced imaging applications.      Electronically Signed: Criselda Peaches 09/23/2022, 10:52 AM   I spent a total of 25 Minutes in remote  clinical consultation, greater than 50% of which was  counseling/coordinating care for cirrhosis complicated by [facility].    Visit type: Audio only (telephone). Audio (no video) only due to patient preference. Alternative for in-person consultation at [facility], [address], [place], [place]. This visit type was conducted due to national recommendations for restrictions regarding the COVID-19 Pandemic (e.g. social distancing).  This format is felt to be most appropriate for this patient at this time.  All issues noted in this document were discussed and addressed.

## 2022-09-25 ENCOUNTER — Other Ambulatory Visit (HOSPITAL_COMMUNITY): Payer: Self-pay | Admitting: Interventional Radiology

## 2022-09-25 ENCOUNTER — Other Ambulatory Visit: Payer: Self-pay | Admitting: Internal Medicine

## 2022-09-25 DIAGNOSIS — C220 Liver cell carcinoma: Secondary | ICD-10-CM

## 2022-09-26 ENCOUNTER — Other Ambulatory Visit (HOSPITAL_COMMUNITY): Payer: Self-pay | Admitting: Interventional Radiology

## 2022-09-26 DIAGNOSIS — C220 Liver cell carcinoma: Secondary | ICD-10-CM

## 2022-10-07 ENCOUNTER — Other Ambulatory Visit: Payer: Self-pay | Admitting: Internal Medicine

## 2022-10-07 DIAGNOSIS — C220 Liver cell carcinoma: Secondary | ICD-10-CM

## 2022-10-07 NOTE — Progress Notes (Signed)
Surgery orders requested with Manuela Schwartz in radiology.

## 2022-10-07 NOTE — Patient Instructions (Signed)
SURGICAL WAITING ROOM VISITATION Patients having surgery or a procedure may have no more than 2 support people in the waiting area - these visitors may rotate.   Children under the age of 5 must have an adult with them who is not the patient. If the patient needs to stay at the hospital during part of their recovery, the visitor guidelines for inpatient rooms apply. Pre-op nurse will coordinate an appropriate time for 1 support person to accompany patient in pre-op.  This support person may not rotate.    Please refer to the [facility] website for the visitor guidelines for Inpatients (after your surgery is over and you are in a regular room).      Your procedure is scheduled on: 10-15-22   Report to [facility] Main Entrance    Report to admitting at 6:30 AM   Call this number if you have problems the morning of surgery [phone]   Do not eat food or drink liquids:After Midnight.          If you have questions, please contact your surgeon's office.   FOLLOW ANY ADDITIONAL PRE OP INSTRUCTIONS YOU RECEIVED FROM YOUR SURGEON'S OFFICE!!!     Oral Hygiene is also important to reduce your risk of infection.                                    Remember - BRUSH YOUR TEETH THE MORNING OF SURGERY WITH YOUR REGULAR TOOTHPASTE   Do NOT smoke after Midnight   Take these medicines the morning of surgery with A SIP OF WATER:   Loratadine  Pantoprazole                             You may not have any metal on your body including hair pins, jewelry, and body piercing             Do not wear make-up, lotions, powders, perfumes or deodorant  Do not wear nail polish including gel and S&S, artificial/acrylic nails, or any other type of covering on natural nails including finger and toenails. If you have artificial nails, gel coating, etc. that needs to be removed by a nail salon please have this removed prior to surgery or surgery may need to be canceled/ delayed if the surgeon/  anesthesia feels like they are unable to be safely monitored.   Do not shave  48 hours prior to surgery.          Do not bring valuables to the hospital. [place].   Contacts, dentures or bridgework may not be worn into surgery.   Bring small overnight bag day of surgery.   DO NOT [place]. PHARMACY WILL DISPENSE MEDICATIONS LISTED ON YOUR MEDICATION LIST TO YOU DURING YOUR ADMISSION [place]!    Special Instructions: Bring a copy of your healthcare power of attorney and living will documents the day of surgery if you haven't scanned  them before.  Please read over the following fact sheets you were given: IF YOU HAVE QUESTIONS ABOUT YOUR PRE-OP INSTRUCTIONS PLEASE CALL [phone] [place]  If you received a COVID test during your pre-op visit  it is requested that you wear a mask when out in public, stay away from anyone that may not be feeling well and notify  your surgeon if you develop symptoms. If you test positive for Covid or have been in contact with anyone that has tested positive in the last 10 days please notify you surgeon.  [facility] - Preparing for Surgery Before surgery, you can play an important role.  Because skin is not sterile, your skin needs to be as free of germs as possible.  You can reduce the number of germs on your skin by washing with CHG (chlorahexidine gluconate) soap before surgery.  CHG is an antiseptic cleaner which kills germs and bonds with the skin to continue killing germs even after washing. Please DO NOT use if you have an allergy to CHG or antibacterial soaps.  If your skin becomes reddened/irritated stop using the CHG and inform your nurse when you arrive at Short Stay. Do not shave (including legs and underarms) for at least 48 hours prior to the first CHG shower.  You may shave your face/neck.  Please follow these instructions carefully:  1.  Shower with CHG Soap the  night before surgery and the  morning of surgery.  2.  If you choose to wash your hair, wash your hair first as usual with your normal  shampoo.  3.  After you shampoo, rinse your hair and body thoroughly to remove the shampoo.                             4.  Use CHG as you would any other liquid soap.  You can apply chg directly to the skin and wash.  Gently with a scrungie or clean washcloth.  5.  Apply the CHG Soap to your body ONLY FROM THE NECK DOWN.   Do   not use on face/ open                           Wound or open sores. Avoid contact with eyes, ears mouth and   genitals (private parts).                       Wash face,  Genitals (private parts) with your normal soap.             6.  Wash thoroughly, paying special attention to the area where your    surgery  will be performed.  7.  Thoroughly rinse your body with warm water from the neck down.  8.  DO NOT shower/wash with your normal soap after using and rinsing off the CHG Soap.                9.  Pat yourself dry with a clean towel.            10.  Wear clean pajamas.            11.  Place clean sheets on your bed the night of your first shower and do not  sleep with pets. Day of Surgery : Do not apply any lotions/deodorants the morning of surgery.  Please wear clean clothes to the hospital/surgery center.  FAILURE TO FOLLOW THESE INSTRUCTIONS MAY RESULT IN THE CANCELLATION OF YOUR SURGERY  PATIENT SIGNATURE_________________________________  NURSE SIGNATURE__________________________________  ________________________________________________________________________    WHAT IS A BLOOD TRANSFUSION? Blood Transfusion Information  A transfusion is the replacement of blood or some of its parts. Blood is made up of multiple cells which provide different functions. Red blood cells carry  oxygen and are used for blood loss replacement. White blood cells fight against infection. Platelets control bleeding. Plasma helps clot  blood. Other blood products are available for specialized needs, such as hemophilia or other clotting disorders. BEFORE THE TRANSFUSION  Who gives blood for transfusions?  Healthy volunteers who are fully evaluated to make sure their blood is safe. This is blood bank blood. Transfusion therapy is the safest it has ever been in the practice of medicine. Before blood is taken from a donor, a complete history is taken to make sure that person has no history of diseases nor engages in risky social behavior (examples are intravenous drug use or sexual activity with multiple partners). The donor's travel history is screened to minimize risk of transmitting infections, such as malaria. The donated blood is tested for signs of infectious diseases, such as HIV and hepatitis. The blood is then tested to be sure it is compatible with you in order to minimize the chance of a transfusion reaction. If you or a relative donates blood, this is often done in anticipation of surgery and is not appropriate for emergency situations. It takes many days to process the donated blood. RISKS AND COMPLICATIONS Although transfusion therapy is very safe and saves many lives, the main dangers of transfusion include:  Getting an infectious disease. Developing a transfusion reaction. This is an allergic reaction to something in the blood you were given. Every precaution is taken to prevent this. The decision to have a blood transfusion has been considered carefully by your caregiver before blood is given. Blood is not given unless the benefits outweigh the risks. AFTER THE TRANSFUSION Right after receiving a blood transfusion, you will usually feel much better and more energetic. This is especially true if your red blood cells have gotten low (anemic). The transfusion raises the level of the red blood cells which carry oxygen, and this usually causes an energy increase. The nurse administering the transfusion will monitor you  carefully for complications. HOME CARE INSTRUCTIONS  No special instructions are needed after a transfusion. You may find your energy is better. Speak with your caregiver about any limitations on activity for underlying diseases you may have. SEEK MEDICAL CARE IF:  Your condition is not improving after your transfusion. You develop redness or irritation at the intravenous (IV) site. SEEK IMMEDIATE MEDICAL CARE IF:  Any of the following symptoms occur over the next 12 hours: Shaking chills. You have a temperature by mouth above 102 F (38.9 C), not controlled by medicine. Chest, back, or muscle pain. People around you feel you are not acting correctly or are confused. Shortness of breath or difficulty breathing. Dizziness and fainting. You get a rash or develop hives. You have a decrease in urine output. Your urine turns a dark color or changes to pink, red, or brown. Any of the following symptoms occur over the next 10 days: You have a temperature by mouth above 102 F (38.9 C), not controlled by medicine. Shortness of breath. Weakness after normal activity. The white part of the eye turns yellow (jaundice). You have a decrease in the amount of urine or are urinating less often. Your urine turns a dark color or changes to pink, red, or brown. Document Released: 12/12/2000 Document Revised: 03/08/2012 Document Reviewed: 07/31/2008 [facility] Patient Information 2014 [place], [place].  _______________________________________________________________________

## 2022-10-07 NOTE — Progress Notes (Addendum)
COVID Vaccine Completed:  Yes  Date of COVID positive in last 90 days:  PCP - Lew Dawes, MD Cardiologist -   Chest x-ray -  EKG -  Stress Test -  ECHO -  Cardiac Cath -  Pacemaker/ICD device last checked: Spinal Cord Stimulator:  Bowel Prep -   Sleep Study -  CPAP -   Fasting Blood Sugar -  Checks Blood Sugar _____ times a day  Blood Thinner Instructions: Aspirin Instructions:  ASA 81  Last Dose:  Activity level:  Can go up a flight of stairs and perform activities of daily living without stopping and without symptoms of chest pain or shortness of breath.  Able to exercise without symptoms  Unable to go up a flight of stairs without symptoms of     Anesthesia review:   Cirrhosis, HTN  Patient denies shortness of breath, fever, cough and chest pain at PAT appointment  Patient verbalized understanding of instructions that were given to them at the PAT appointment. Patient was also instructed that they will need to review over the PAT instructions again at home before surgery.

## 2022-10-08 ENCOUNTER — Encounter (HOSPITAL_COMMUNITY)
Admission: RE | Admit: 2022-10-08 | Discharge: 2022-10-08 | Disposition: A | Discharge status: Home / Self Care | Payer: Medicare Other | Source: Ambulatory Visit | Attending: Urology | Admitting: Urology

## 2022-10-08 ENCOUNTER — Encounter (HOSPITAL_COMMUNITY): Payer: Self-pay

## 2022-10-08 ENCOUNTER — Other Ambulatory Visit: Payer: Self-pay

## 2022-10-08 VITALS — BP 158/92 | HR 85 | Temp 98.3°F | Resp 16 | Ht 60.0 in | Wt 290.8 lb

## 2022-10-08 DIAGNOSIS — Z01818 Encounter for other preprocedural examination: Secondary | ICD-10-CM | POA: Diagnosis not present

## 2022-10-08 DIAGNOSIS — C220 Liver cell carcinoma: Secondary | ICD-10-CM

## 2022-10-08 DIAGNOSIS — I2510 Atherosclerotic heart disease of native coronary artery without angina pectoris: Secondary | ICD-10-CM

## 2022-10-08 HISTORY — DX: Anemia, unspecified: D64.9

## 2022-10-08 LAB — CBC WITH DIFFERENTIAL/PLATELET
Abs Immature Granulocytes: 0.01 10*3/uL (ref 0.00–0.07)
Basophils Absolute: 0 10*3/uL (ref 0.0–0.1)
Basophils Relative: 1 %
Eosinophils Absolute: 0.1 10*3/uL (ref 0.0–0.5)
Eosinophils Relative: 4 %
HCT: 40.9 % (ref 36.0–46.0)
Hemoglobin: 12.7 g/dL (ref 12.0–15.0)
Immature Granulocytes: 0 %
Lymphocytes Relative: 35 %
Lymphs Abs: 1.2 10*3/uL (ref 0.7–4.0)
MCH: 27.3 pg (ref 26.0–34.0)
MCHC: 31.1 g/dL (ref 30.0–36.0)
MCV: 88 fL (ref 80.0–100.0)
Monocytes Absolute: 0.3 10*3/uL (ref 0.1–1.0)
Monocytes Relative: 10 %
Neutro Abs: 1.7 10*3/uL (ref 1.7–7.7)
Neutrophils Relative %: 50 %
Platelets: 203 10*3/uL (ref 150–400)
RBC: 4.65 MIL/uL (ref 3.87–5.11)
RDW: 13.2 % (ref 11.5–15.5)
WBC: 3.4 10*3/uL — ABNORMAL LOW (ref 4.0–10.5)
nRBC: 0 % (ref 0.0–0.2)

## 2022-10-08 LAB — COMPREHENSIVE METABOLIC PANEL
ALT: 12 U/L (ref 0–44)
AST: 18 U/L (ref 15–41)
Albumin: 4 g/dL (ref 3.5–5.0)
Alkaline Phosphatase: 78 U/L (ref 38–126)
Anion gap: 7 (ref 5–15)
BUN: 15 mg/dL (ref 8–23)
CO2: 25 mmol/L (ref 22–32)
Calcium: 9.2 mg/dL (ref 8.9–10.3)
Chloride: 110 mmol/L (ref 98–111)
Creatinine, Ser: 0.94 mg/dL (ref 0.44–1.00)
GFR, Estimated: >60 mL/min (ref >60)
Glucose, Bld: 95 mg/dL (ref 70–99)
Potassium: 3.8 mmol/L (ref 3.5–5.1)
Sodium: 142 mmol/L (ref 135–145)
Total Bilirubin: 0.7 mg/dL (ref 0.3–1.2)
Total Protein: 7.6 g/dL (ref 6.5–8.1)

## 2022-10-08 LAB — SURGICAL PCR SCREEN
MRSA, PCR: NEGATIVE
Staphylococcus aureus: NEGATIVE

## 2022-10-08 LAB — PROTIME-INR
INR: 1 (ref 0.8–1.2)
Prothrombin Time: 13.5 seconds (ref 11.4–15.2)

## 2022-10-09 ENCOUNTER — Other Ambulatory Visit: Payer: Self-pay | Admitting: Radiology

## 2022-10-09 DIAGNOSIS — K769 Liver disease, unspecified: Secondary | ICD-10-CM

## 2022-10-13 DIAGNOSIS — H40053 Ocular hypertension, bilateral: Secondary | ICD-10-CM | POA: Diagnosis not present

## 2022-10-13 DIAGNOSIS — H43813 Vitreous degeneration, bilateral: Secondary | ICD-10-CM | POA: Diagnosis not present

## 2022-10-13 MED ORDER — LC BEADS 100-300UM IN SALINE
150.0000 mg | Freq: Once | Status: AC
  Administered 2022-10-15: 75 mg via INTRA_ARTERIAL
  Filled 2022-10-13: qty 75

## 2022-10-14 NOTE — Anesthesia Preprocedure Evaluation (Signed)
Anesthesia Evaluation  Patient identified by MRN, date of birth, ID band Patient awake    Reviewed: Allergy & Precautions, NPO status , Patient's Chart, lab work & pertinent test results  History of Anesthesia Complications Negative for: history of anesthetic complications  Airway Mallampati: II  TM Distance: >3 FB Neck ROM: Full    Dental  (+) Poor Dentition, Edentulous Upper, Loose, Dental Advisory Given   Pulmonary former smoker   breath sounds clear to auscultation       Cardiovascular hypertension, Pt. on medications (-) angina Rhythm:Regular Rate:Normal     Neuro/Psych negative neurological ROS     GI/Hepatic GERD  Medicated and Controlled,(+) Cirrhosis       , Hepatitis -, CHepatocellular carcinoma   Endo/Other    Morbid obesityBMI 56.8  Renal/GU negative Renal ROS     Musculoskeletal   Abdominal (+) + obese,   Peds  Hematology negative hematology ROS (+)   Anesthesia Other Findings   Reproductive/Obstetrics                             Anesthesia Physical Anesthesia Plan  ASA: 3  Anesthesia Plan: General   Post-op Pain Management: Minimal or no pain anticipated   Induction: Intravenous  PONV Risk Score and Plan: 3 and Ondansetron, Dexamethasone and Treatment may vary due to age or medical condition  Airway Management Planned: Oral ETT  Additional Equipment: None  Intra-op Plan:   Post-operative Plan: Extubation in OR  I have reviewed the patients History and Physical, chart, labs and discussed the procedure including the risks, benefits and alternatives for the proposed anesthesia with the patient or authorized representative who has indicated his/her understanding and acceptance.     Dental advisory givenInformed Consent:   CRNA and SurgeonPlan Discussed with:   Anesthesia Plan Comments:         Anesthesia Quick Evaluation

## 2022-10-15 ENCOUNTER — Ambulatory Visit (HOSPITAL_COMMUNITY)
Admission: RE | Admit: 2022-10-15 | Discharge: 2022-10-15 | Disposition: A | Discharge status: Home / Self Care | Payer: Medicare Other | Source: Ambulatory Visit | Attending: Interventional Radiology | Admitting: Interventional Radiology

## 2022-10-15 ENCOUNTER — Other Ambulatory Visit: Payer: Self-pay

## 2022-10-15 ENCOUNTER — Encounter (HOSPITAL_COMMUNITY)
Admission: RE | Disposition: A | Discharge status: Home / Self Care | Payer: Self-pay | Source: Ambulatory Visit | Attending: Interventional Radiology

## 2022-10-15 ENCOUNTER — Encounter (HOSPITAL_COMMUNITY): Payer: Self-pay

## 2022-10-15 ENCOUNTER — Observation Stay (HOSPITAL_COMMUNITY)
Admission: RE | Admit: 2022-10-15 | Discharge: 2022-10-16 | Disposition: A | Discharge status: Home / Self Care | Payer: Medicare Other | Source: Ambulatory Visit | Attending: Interventional Radiology | Admitting: Interventional Radiology

## 2022-10-15 ENCOUNTER — Ambulatory Visit (HOSPITAL_COMMUNITY): Payer: Medicare Other

## 2022-10-15 DIAGNOSIS — K7581 Nonalcoholic steatohepatitis (NASH): Secondary | ICD-10-CM | POA: Diagnosis not present

## 2022-10-15 DIAGNOSIS — Z7982 Long term (current) use of aspirin: Secondary | ICD-10-CM | POA: Diagnosis not present

## 2022-10-15 DIAGNOSIS — Z87891 Personal history of nicotine dependence: Secondary | ICD-10-CM | POA: Insufficient documentation

## 2022-10-15 DIAGNOSIS — Z8619 Personal history of other infectious and parasitic diseases: Secondary | ICD-10-CM | POA: Diagnosis not present

## 2022-10-15 DIAGNOSIS — Z96653 Presence of artificial knee joint, bilateral: Secondary | ICD-10-CM | POA: Diagnosis not present

## 2022-10-15 DIAGNOSIS — K769 Liver disease, unspecified: Principal | ICD-10-CM

## 2022-10-15 DIAGNOSIS — K219 Gastro-esophageal reflux disease without esophagitis: Secondary | ICD-10-CM | POA: Diagnosis not present

## 2022-10-15 DIAGNOSIS — I10 Essential (primary) hypertension: Secondary | ICD-10-CM | POA: Insufficient documentation

## 2022-10-15 DIAGNOSIS — C220 Liver cell carcinoma: Secondary | ICD-10-CM | POA: Diagnosis not present

## 2022-10-15 DIAGNOSIS — K7460 Unspecified cirrhosis of liver: Secondary | ICD-10-CM | POA: Insufficient documentation

## 2022-10-15 DIAGNOSIS — Z79899 Other long term (current) drug therapy: Secondary | ICD-10-CM | POA: Diagnosis not present

## 2022-10-15 DIAGNOSIS — Z6841 Body Mass Index (BMI) 40.0 and over, adult: Secondary | ICD-10-CM

## 2022-10-15 HISTORY — PX: RADIOLOGY WITH ANESTHESIA: SHX6223

## 2022-10-15 HISTORY — PX: IR 3D INDEPENDENT WKST: IMG2385

## 2022-10-15 HISTORY — PX: IR US GUIDE VASC ACCESS RIGHT: IMG2390

## 2022-10-15 HISTORY — PX: IR EMBO TUMOR ORGAN ISCHEMIA INFARCT INC GUIDE ROADMAPPING: IMG5449

## 2022-10-15 HISTORY — PX: IR ANGIOGRAM SELECTIVE EACH ADDITIONAL VESSEL: IMG667

## 2022-10-15 HISTORY — PX: IR ANGIOGRAM VISCERAL SELECTIVE: IMG657

## 2022-10-15 LAB — COMPREHENSIVE METABOLIC PANEL
ALT: 8 U/L (ref 0–44)
AST: 28 U/L (ref 15–41)
Albumin: 3.7 g/dL (ref 3.5–5.0)
Alkaline Phosphatase: 86 U/L (ref 38–126)
Anion gap: 5 (ref 5–15)
BUN: 15 mg/dL (ref 8–23)
CO2: 24 mmol/L (ref 22–32)
Calcium: 8.8 mg/dL — ABNORMAL LOW (ref 8.9–10.3)
Chloride: 112 mmol/L — ABNORMAL HIGH (ref 98–111)
Creatinine, Ser: 1.05 mg/dL — ABNORMAL HIGH (ref 0.44–1.00)
GFR, Estimated: 56 mL/min — ABNORMAL LOW (ref >60)
Glucose, Bld: 81 mg/dL (ref 70–99)
Potassium: 5.5 mmol/L — ABNORMAL HIGH (ref 3.5–5.1)
Sodium: 141 mmol/L (ref 135–145)
Total Bilirubin: 1.2 mg/dL (ref 0.3–1.2)
Total Protein: 7.2 g/dL (ref 6.5–8.1)

## 2022-10-15 LAB — PROTIME-INR
INR: 1 (ref 0.8–1.2)
Prothrombin Time: 13.5 seconds (ref 11.4–15.2)

## 2022-10-15 LAB — CBC WITH DIFFERENTIAL/PLATELET
Abs Immature Granulocytes: 0 10*3/uL (ref 0.00–0.07)
Basophils Absolute: 0 10*3/uL (ref 0.0–0.1)
Basophils Relative: 1 %
Eosinophils Absolute: 0.2 10*3/uL (ref 0.0–0.5)
Eosinophils Relative: 5 %
HCT: 40.4 % (ref 36.0–46.0)
Hemoglobin: 11.8 g/dL — ABNORMAL LOW (ref 12.0–15.0)
Immature Granulocytes: 0 %
Lymphocytes Relative: 34 %
Lymphs Abs: 1.3 10*3/uL (ref 0.7–4.0)
MCH: 26.4 pg (ref 26.0–34.0)
MCHC: 29.2 g/dL — ABNORMAL LOW (ref 30.0–36.0)
MCV: 90.4 fL (ref 80.0–100.0)
Monocytes Absolute: 0.4 10*3/uL (ref 0.1–1.0)
Monocytes Relative: 12 %
Neutro Abs: 1.8 10*3/uL (ref 1.7–7.7)
Neutrophils Relative %: 48 %
Platelets: 182 10*3/uL (ref 150–400)
RBC: 4.47 MIL/uL (ref 3.87–5.11)
RDW: 13.4 % (ref 11.5–15.5)
WBC: 3.7 10*3/uL — ABNORMAL LOW (ref 4.0–10.5)
nRBC: 0 % (ref 0.0–0.2)

## 2022-10-15 LAB — TYPE AND SCREEN
ABO/RH(D): O POS
Antibody Screen: NEGATIVE

## 2022-10-15 SURGERY — IR WITH ANESTHESIA
Anesthesia: General

## 2022-10-15 MED ORDER — LIDOCAINE HCL 1 % IJ SOLN
INTRAMUSCULAR | Status: AC
  Administered 2022-10-15: 15 mL via SUBCUTANEOUS
  Filled 2022-10-15: qty 20

## 2022-10-15 MED ORDER — IODIXANOL 320 MG/ML IV SOLN
50.0000 mL | Freq: Once | INTRAVENOUS | Status: AC | PRN
  Administered 2022-10-15: 50 mL via INTRA_ARTERIAL

## 2022-10-15 MED ORDER — ASPIRIN 81 MG PO TBEC
81.0000 mg | DELAYED_RELEASE_TABLET | Freq: Every day | ORAL | Status: DC
  Administered 2022-10-15 – 2022-10-16 (×2): 81 mg via ORAL
  Filled 2022-10-15 (×2): qty 1

## 2022-10-15 MED ORDER — LOSARTAN POTASSIUM 50 MG PO TABS
100.0000 mg | ORAL_TABLET | Freq: Every day | ORAL | Status: DC
  Administered 2022-10-15 – 2022-10-16 (×2): 100 mg via ORAL
  Filled 2022-10-15 (×2): qty 2

## 2022-10-15 MED ORDER — ONDANSETRON HCL 4 MG/2ML IJ SOLN: 4.0000 mg | Freq: Four times a day (QID) | INTRAMUSCULAR | Status: DC | PRN

## 2022-10-15 MED ORDER — NITROGLYCERIN IN D5W 100-5 MCG/ML-% IV SOLN
INTRAVENOUS | Status: AC
  Filled 2022-10-15: qty 250

## 2022-10-15 MED ORDER — PROPOFOL 10 MG/ML IV BOLUS
INTRAVENOUS | Status: DC | PRN
  Administered 2022-10-15: 200 mg via INTRAVENOUS

## 2022-10-15 MED ORDER — SODIUM CHLORIDE 0.9 % IV SOLN: INTRAVENOUS | Status: DC

## 2022-10-15 MED ORDER — FENTANYL CITRATE PF 50 MCG/ML IJ SOSY
25.0000 ug | PREFILLED_SYRINGE | INTRAMUSCULAR | Status: DC | PRN
  Administered 2022-10-15 (×2): 50 ug via INTRAVENOUS

## 2022-10-15 MED ORDER — DEXAMETHASONE SODIUM PHOSPHATE 10 MG/ML IJ SOLN: 8.0000 mg | Freq: Once | INTRAMUSCULAR | Status: DC

## 2022-10-15 MED ORDER — IODIXANOL 320 MG/ML IV SOLN
50.0000 mL | Freq: Once | INTRAVENOUS | Status: AC | PRN
  Administered 2022-10-15: 20 mL via INTRA_ARTERIAL

## 2022-10-15 MED ORDER — MIDAZOLAM HCL 2 MG/2ML IJ SOLN
INTRAMUSCULAR | Status: AC
  Filled 2022-10-15: qty 2

## 2022-10-15 MED ORDER — SODIUM CHLORIDE 0.9 % IV SOLN
2.0000 g | Freq: Once | INTRAVENOUS | Status: AC
  Administered 2022-10-15: 2 g via INTRAVENOUS
  Filled 2022-10-15 (×2): qty 2

## 2022-10-15 MED ORDER — LIDOCAINE 2% (20 MG/ML) 5 ML SYRINGE
INTRAMUSCULAR | Status: DC | PRN
  Administered 2022-10-15: 15 mg via INTRAVENOUS

## 2022-10-15 MED ORDER — PANTOPRAZOLE SODIUM 40 MG PO TBEC
40.0000 mg | DELAYED_RELEASE_TABLET | Freq: Every day | ORAL | Status: DC
  Administered 2022-10-15 – 2022-10-16 (×2): 40 mg via ORAL
  Filled 2022-10-15 (×2): qty 1

## 2022-10-15 MED ORDER — LACTATED RINGERS IV SOLN: INTRAVENOUS | Status: DC

## 2022-10-15 MED ORDER — ACETAMINOPHEN 10 MG/ML IV SOLN
1000.0000 mg | Freq: Once | INTRAVENOUS | Status: DC | PRN
  Administered 2022-10-15: 1000 mg via INTRAVENOUS

## 2022-10-15 MED ORDER — SODIUM CHLORIDE 0.9 % IV SOLN
8.0000 mg | Freq: Once | INTRAVENOUS | Status: AC
  Administered 2022-10-15: 8 mg via INTRAVENOUS
  Filled 2022-10-15: qty 4

## 2022-10-15 MED ORDER — ACETAMINOPHEN 10 MG/ML IV SOLN
INTRAVENOUS | Status: AC
  Filled 2022-10-15: qty 100

## 2022-10-15 MED ORDER — FUROSEMIDE 20 MG PO TABS
20.0000 mg | ORAL_TABLET | Freq: Every day | ORAL | Status: DC
  Administered 2022-10-15 – 2022-10-16 (×2): 20 mg via ORAL
  Filled 2022-10-15 (×2): qty 1

## 2022-10-15 MED ORDER — ORAL CARE MOUTH RINSE: 15.0000 mL | Freq: Once | OROMUCOSAL | Status: AC

## 2022-10-15 MED ORDER — NITROGLYCERIN 1 MG/10 ML FOR IR/CATH LAB
200.0000 ug | Freq: Once | INTRA_ARTERIAL | Status: AC
  Administered 2022-10-15: 200 ug via INTRA_ARTERIAL

## 2022-10-15 MED ORDER — CHLORHEXIDINE GLUCONATE 0.12 % MT SOLN
15.0000 mL | Freq: Once | OROMUCOSAL | Status: AC
  Administered 2022-10-15: 15 mL via OROMUCOSAL

## 2022-10-15 MED ORDER — FENTANYL CITRATE PF 50 MCG/ML IJ SOSY
PREFILLED_SYRINGE | INTRAMUSCULAR | Status: AC
  Filled 2022-10-15: qty 2

## 2022-10-15 MED ORDER — FENTANYL CITRATE (PF) 250 MCG/5ML IJ SOLN
INTRAMUSCULAR | Status: DC | PRN
  Administered 2022-10-15 (×2): 50 ug via INTRAVENOUS
  Administered 2022-10-15: 100 ug via INTRAVENOUS

## 2022-10-15 MED ORDER — ROCURONIUM BROMIDE 10 MG/ML (PF) SYRINGE
PREFILLED_SYRINGE | INTRAVENOUS | Status: DC | PRN
  Administered 2022-10-15: 10 mg via INTRAVENOUS
  Administered 2022-10-15: 20 mg via INTRAVENOUS
  Administered 2022-10-15: 70 mg via INTRAVENOUS

## 2022-10-15 MED ORDER — PHENYLEPHRINE 80 MCG/ML (10ML) SYRINGE FOR IV PUSH (FOR BLOOD PRESSURE SUPPORT)
PREFILLED_SYRINGE | INTRAVENOUS | Status: DC | PRN
  Administered 2022-10-15: 80 ug via INTRAVENOUS
  Administered 2022-10-15 (×2): 160 ug via INTRAVENOUS
  Administered 2022-10-15: 80 ug via INTRAVENOUS

## 2022-10-15 MED ORDER — HYDROCODONE-ACETAMINOPHEN 5-325 MG PO TABS
1.0000 | ORAL_TABLET | ORAL | Status: DC | PRN
  Administered 2022-10-15: 1 via ORAL
  Administered 2022-10-16 (×2): 2 via ORAL
  Filled 2022-10-15: qty 1
  Filled 2022-10-15 (×2): qty 2

## 2022-10-15 MED ORDER — DEXAMETHASONE SODIUM PHOSPHATE 10 MG/ML IJ SOLN
INTRAMUSCULAR | Status: DC | PRN
  Administered 2022-10-15: 10 mg via INTRAVENOUS

## 2022-10-15 MED ORDER — SUGAMMADEX SODIUM 200 MG/2ML IV SOLN
INTRAVENOUS | Status: DC | PRN
  Administered 2022-10-15: 263.8 mg via INTRAVENOUS

## 2022-10-15 MED ORDER — FENTANYL CITRATE (PF) 250 MCG/5ML IJ SOLN
INTRAMUSCULAR | Status: AC
  Filled 2022-10-15: qty 5

## 2022-10-15 MED ORDER — DOCUSATE SODIUM 100 MG PO CAPS
100.0000 mg | ORAL_CAPSULE | Freq: Two times a day (BID) | ORAL | Status: DC
  Administered 2022-10-15 – 2022-10-16 (×2): 100 mg via ORAL
  Filled 2022-10-15 (×2): qty 1

## 2022-10-15 NOTE — Sedation Documentation (Signed)
Patient brought into the IR suite for image guided MW ablation to the liver and DEB-TACE. Foley catheter inserted without difficulty after the patient was sedated and intubated. Refer to anesthesia record for ongoing assessment, VS and medications given. Plan is to go to PACU post procedure.

## 2022-10-15 NOTE — Anesthesia Postprocedure Evaluation (Signed)
Anesthesia Post Note  Patient: Samantha [name]  Procedure(s) Performed: IR WITH ANESTHESIA MICROWAVE ABLATION OF LIVER     Patient location during evaluation: PACU Anesthesia Type: General Level of consciousness: awake and alert Pain management: pain level controlled Vital Signs Assessment: post-procedure vital signs reviewed and stable Respiratory status: spontaneous breathing, nonlabored ventilation, respiratory function stable and patient connected to nasal cannula oxygen Cardiovascular status: blood pressure returned to baseline and stable Postop Assessment: no apparent nausea or vomiting Anesthetic complications: no   No notable events documented.  Last Vitals:  Vitals:   10/15/22 1600 10/15/22 1635  BP: (!) 154/98 (!) 172/93  Pulse: 72 84  Resp: 16 18  Temp:  36.6 C  SpO2: 100% 100%    Last Pain:  Vitals:   10/15/22 1635  TempSrc: Oral  PainSc: 8                  Norva Bowe P Lindsi Bayliss

## 2022-10-15 NOTE — H&P (Signed)
  The note originally documented on this encounter has been moved the the encounter in which it belongs.  

## 2022-10-15 NOTE — Anesthesia Procedure Notes (Signed)
Procedure Name: Intubation Date/Time: 10/15/2022 9:36 AM  Performed by: Jenne Campus, CRNAPre-anesthesia Checklist: Patient identified, Emergency Drugs available, Suction available and Patient being monitored Patient Re-evaluated:Patient Re-evaluated prior to induction Oxygen Delivery Method: Circle System Utilized Preoxygenation: Pre-oxygenation with 100% oxygen Induction Type: IV induction Ventilation: Mask ventilation without difficulty Laryngoscope Size: Miller and 2 Grade View: Grade I Tube type: Oral Tube size: 7.5 mm Number of attempts: 1 Airway Equipment and Method: Stylet Placement Confirmation: ETT inserted through vocal cords under direct vision, positive ETCO2 and breath sounds checked- equal and bilateral Secured at: 22 cm Tube secured with: Tape Dental Injury: Teeth and Oropharynx as per pre-operative assessment

## 2022-10-15 NOTE — H&P (Signed)
Chief Complaint: Patient was seen in consultation today for hepatocellular carcinoma   Supervising Physician: [name]  Patient Status: Samantha [name]  History of Present Illness: Samantha [name] is a 71 y.o. female with PMH of anemia, GERD, hepatitis C, NASH cirrhosis, hypertension, obesity, and liver cancer being seen today in relation to her hepatocellular carcinoma. The patient is known to IR from TACE procedure in February 2017 and CT-guided thermal ablation in March 2017. MRI performed 09/22/22 revealed a 1.5cm lesion concerning for recurrence of hepatocellular carcinoma.   Past Medical History:  Diagnosis Date   Allergy    seasonal   Anemia    Cataract    bilateral - MD just watching   Cirrhosis ([place]) 2018   Diverticulosis    Edema    Elevated glucose    no longer an issue 03-18-16   Fatty liver 2017   GERD (gastroesophageal reflux disease)    H/O seasonal allergies    Hepatitis C    hepatitis c completed  tx march 2017   Hyperlipidemia 2011   Hypertension    LBP (low back pain)    Liver cancer ([place]) 2017   IN REMISSION. WHEN IDAGNONED SIZE OF GRAPEFRUIT    Morbid obesity ([place])    Osteoarthritis    B knees   Right middle lobe pulmonary nodule    resolved 2018   Uterine fibroid    Wears glasses     Past Surgical History:  Procedure Laterality Date   BIOPSY  07/04/2019   Procedure: BIOPSY;  Surgeon: Jerene Bears, MD;  Location: [facility];  Service: Gastroenterology;;   BIOPSY  07/11/2020   Procedure: BIOPSY;  Surgeon: Lavena Bullion, DO;  Location: [facility];  Service: Gastroenterology;;   CHOLECYSTECTOMY     COLONOSCOPY N/A 01/09/2015   Procedure: COLONOSCOPY;  Surgeon: Jerene Bears, MD;  Location: [facility];  Service: Gastroenterology;  Laterality: N/A;   COLONOSCOPY WITH PROPOFOL N/A 06/06/2020   Procedure: COLONOSCOPY WITH PROPOFOL;  Surgeon: Lavena Bullion, DO;  Location: [facility];  Service: Gastroenterology;  Laterality:  N/A;   COLONOSCOPY WITH PROPOFOL N/A 07/11/2020   Procedure: COLONOSCOPY WITH PROPOFOL;  Surgeon: Lavena Bullion, DO;  Location: [facility];  Service: Gastroenterology;  Laterality: N/A;   ESOPHAGOGASTRODUODENOSCOPY (EGD) WITH PROPOFOL N/A 06/10/2016   Procedure: ESOPHAGOGASTRODUODENOSCOPY (EGD) WITH PROPOFOL;  Surgeon: Jerene Bears, MD;  Location: [facility];  Service: Gastroenterology;  Laterality: N/A;   ESOPHAGOGASTRODUODENOSCOPY (EGD) WITH PROPOFOL N/A 07/04/2019   Procedure: ESOPHAGOGASTRODUODENOSCOPY (EGD) WITH PROPOFOL;  Surgeon: Jerene Bears, MD;  Location: [facility];  Service: Gastroenterology;  Laterality: N/A;   HYSTEROSCOPY WITH D & C N/A 10/15/2018   Procedure: DILATATION AND CURETTAGE /HYSTEROSCOPY;  Surgeon: Aloha Gell, MD;  Location: [facility];  Service: Gynecology;  Laterality: N/A;   IR GENERIC HISTORICAL  07/03/2016   IR RADIOLOGIST EVAL & MGMT 07/03/2016 [name], MD [facility]   IR GENERIC HISTORICAL  11/18/2016   IR RADIOLOGIST EVAL & MGMT 11/18/2016 [name], MD [facility]   IR RADIOLOGIST EVAL & MGMT  04/09/2017   IR RADIOLOGIST EVAL & MGMT  11/05/2017   IR RADIOLOGIST EVAL & MGMT  03/13/2020   IR RADIOLOGIST EVAL & MGMT  08/30/2020   IR RADIOLOGIST EVAL & MGMT  04/10/2021   IR RADIOLOGIST EVAL & MGMT  06/04/2022   IR RADIOLOGIST EVAL & MGMT  09/23/2022   JOINT REPLACEMENT     BILATERAL KNEES   left  shoulder arthroplasty     POLYPECTOMY  06/06/2020   Procedure: POLYPECTOMY;  Surgeon: Lavena Bullion, DO;  Location: [facility];  Service: Gastroenterology;;   POLYPECTOMY  07/11/2020   Procedure: POLYPECTOMY;  Surgeon: Lavena Bullion, DO;  Location: [facility];  Service: Gastroenterology;;   right knee replacement  2007   total knee replacement L  03-13-08   TUBAL LIGATION      Allergies: Amlodipine, Ace inhibitors, Benazepril hcl, and Codeine  Medications: Prior to Admission medications   Medication Sig Start Date End Date  Taking? Authorizing Provider  aspirin EC 81 MG tablet Take 81 mg by mouth daily.    [provider]  BLACK COHOSH EXTRACT PO Take 1 tablet by mouth daily.    [provider]  Cholecalciferol 25 MCG (1000 UT) tablet Take 1,000 Units by mouth daily.     [provider]  furosemide (LASIX) 20 MG tablet Take 1-2 tablets (20-40 mg total) by mouth daily. Patient taking differently: Take 20 mg by mouth daily. 08/28/21   Plotnikov, Evie Lacks, MD  glucosamine-chondroitin 500-400 MG tablet Take 1 tablet by mouth daily.    [provider]  loratadine (EQ ALLERGY RELIEF) 10 MG tablet Take 1 tablet by mouth once daily 07/24/22   Plotnikov, Evie Lacks, MD  losartan (COZAAR) 100 MG tablet Take 1 tablet by mouth once daily 09/25/22   Plotnikov, Evie Lacks, MD  meloxicam (MOBIC) 7.5 MG tablet TAKE 1 TO 2 TABLETS BY MOUTH ONCE DAILY AS NEEDED FOR PAIN 09/25/22   Plotnikov, Evie Lacks, MD  Multiple Vitamin (MULTIVITAMIN WITH MINERALS) TABS tablet Take 1 tablet by mouth daily.     [provider]  pantoprazole (PROTONIX) 40 MG tablet Take 1 tablet (40 mg total) by mouth daily. 08/28/21   Plotnikov, Evie Lacks, MD  potassium chloride SA (KLOR-CON) 20 MEQ tablet Take 1 tablet (20 mEq total) by mouth daily. 08/28/21   Plotnikov, Evie Lacks, MD  silver sulfADIAZINE (SILVADENE) 1 % cream Apply 1 application topically daily. 09/09/21   Jaynee Eagles, PA-C  trolamine salicylate (ASPERCREME) 10 % cream Apply 1 Application topically as needed for muscle pain.    [provider]  vitamin C (ASCORBIC ACID) 500 MG tablet Take 500 mg by mouth daily.    [provider]  promethazine (PHENERGAN) 12.5 MG tablet Take 1 tablet (12.5 mg total) by mouth every 6 (six) hours as needed for nausea or vomiting. 08/09/19 09/27/20  Janith Lima, MD     Family History  Problem Relation Age of Onset   Hypertension Mother    Ovarian cancer Mother    Hypertension Father    Cancer Father 28        prostate cancer    Diabetes Father    Diabetes Other    Hypertension Other    Colon cancer Neg Hx    Esophageal cancer Neg Hx    Rectal cancer Neg Hx    Stomach cancer Neg Hx    Breast cancer Neg Hx     Social History   Socioeconomic History   Marital status: Single    Spouse name: Not on file   Number of children: 4   Years of education: Not on file   Highest education level: Not on file  Occupational History   Not on file  Tobacco Use   Smoking status: Former    Packs/day: 0.25    Years: 20.00    Total pack years: 5.00  Types: Cigarettes    Quit date: 12/30/2007    Years since quitting: 14.8   Smokeless tobacco: Never  Vaping Use   Vaping Use: Never used  Substance and Sexual Activity   Alcohol use: No    Comment: 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, quit drinking alcohol in 10/2015    Drug use: No   Sexual activity: Not Currently    Birth control/protection: Post-menopausal  Other Topics Concern   Not on file  Social History Narrative   Regular exercise: walk a few times a week   Caffeine use: none   Single, raising her young great granddaughter and watches grandchildren after school   Has a room mate to help prn   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 [place] Strain: [place]  (01/17/2022)   Overall Financial Resource Strain (CARDIA)    Difficulty of Paying Living Expenses: Not hard at all  Food Insecurity: No Food Insecurity (01/17/2022)   Hunger Vital Sign    Worried About Running Out of Food in the Last Year: Never true    Ran Out of Food in the Last Year: Never true  Transportation Needs: No Transportation Needs (01/17/2022)   PRAPARE - Hydrologist (Medical): No    Lack of Transportation (Non-Medical): No  Physical Activity: Sufficiently Active (01/17/2022)   Exercise Vital Sign    Days of Exercise per Week: 5 days     Minutes of Exercise per Session: 30 min  Stress: No Stress Concern Present (01/17/2022)   [place]    Feeling of Stress : Not at all  Social Connections: Moderately Isolated (01/17/2022)   Social Connection and Isolation Panel [NHANES]    Frequency of Communication with Friends and Family: Three times a week    Frequency of Social Gatherings with Friends and Family: Three times a week    Attends Religious Services: More than 4 times per year    Active Member of Clubs or Organizations: No    Attends Archivist Meetings: Never    Marital Status: Never married    Review of Systems: A 12 point ROS discussed and pertinent positives are indicated in the HPI above.  All other systems are negative.  Review of Systems  Constitutional:  Negative for chills and fever.  Respiratory:  Negative for chest tightness and shortness of breath.   Cardiovascular:  Negative for chest pain.  Gastrointestinal:  Negative for abdominal pain, diarrhea, nausea and vomiting.  Neurological:  Negative for dizziness and headaches.  Psychiatric/Behavioral:  Negative for confusion.     Vital Signs: LMP  (LMP Unknown)    Physical Exam Vitals reviewed.  Constitutional:      General: She is not in acute distress.    Appearance: She is obese.  HENT:     Mouth/Throat:     Mouth: Mucous membranes are moist.  Cardiovascular:     Rate and Rhythm: Normal rate and regular rhythm.     Pulses: Normal pulses.     Heart sounds: Normal heart sounds.  Pulmonary:     Effort: Pulmonary effort is normal.     Breath sounds: Normal breath sounds.  Abdominal:     General: Bowel sounds are normal.     Palpations: Abdomen is soft.     Tenderness: There is no abdominal tenderness.  Musculoskeletal:  Right lower leg: Edema present.     Left lower leg: Edema present.  Skin:    General: Skin is warm and dry.  Neurological:     Mental Status: She  is alert and oriented to person, place, and time.  Psychiatric:        Mood and Affect: Mood normal.        Behavior: Behavior normal.        Thought Content: Thought content normal.        Judgment: Judgment normal.     Imaging: IR Radiologist Eval & Mgmt  Result Date: 09/23/2022 Please refer to notes tab for details about interventional procedure. (Op Note)  MR ABDOMEN W WO CONTRAST  Result Date: 09/23/2022 CLINICAL DATA:  Cirrhosis with history of hepatocellular carcinoma treated 6 years ago with chemoembolization. Follow-up of liver lesion EXAM: MRI ABDOMEN WITHOUT AND WITH CONTRAST TECHNIQUE: Multiplanar multisequence MR imaging of the abdomen was performed both before and after the administration of intravenous contrast. CONTRAST:  10 cc Vueway COMPARISON:  06/03/2022 FINDINGS: Lower chest: Mild cardiomegaly, without pericardial or pleural effusion. A right cardiophrenic angle mildly enlarged node of 6 mm is similar and likely reactive. Hepatobiliary: Moderate cirrhosis. The ablation defect within the peripheral portion of segment 8 and less so 5 demonstrates no evidence of local recurrence. Measures on the order of 2.6 x 2.2 cm on 53/803. Slightly decreased from 3.2 x 2.1 cm on the prior exam. Again identified is an observation within segment 7. Measures 1.5 x 1.0 cm on 36/801. Compare 1.0 x 0.9 cm on the prior exam (when remeasured). This demonstrates T2 hyperintensity on 09/04, restricted diffusion on 09/550. Arterial phase non rim enhancement including on 35/802. Delayed phase equivocal washout medially including on 33/804. No enhancing capsule. No new lesion. Cholecystectomy, without biliary ductal dilatation. Pancreas:  Normal, without mass or ductal dilatation. Spleen:  Normal in size, without focal abnormality. Adrenals/Urinary Tract: Normal adrenal glands. Tiny lesions in both kidneys are too small to characterize but most likely cysts. No hydronephrosis. Stomach/Bowel: Normal stomach  and small bowel. Scattered colonic diverticula. Vascular/Lymphatic: Aortic atherosclerosis. No splenic artery aneurysm. Patent portal and hepatic veins. No specific evidence of portal venous hypertension. No retroperitoneal or retrocrural adenopathy. Other:  No ascites. Musculoskeletal: No acute osseous abnormality. IMPRESSION: 1. The segment 7 1.5 cm observation demonstrates arterial phase non rim enhancement and threshold growth. Considered LR 5. 2. No evidence of local recurrence at the ablation site. 3. No evidence of abdominal metastatic disease. Electronically Signed   By: Abigail Miyamoto M.D.   On: 09/23/2022 09:08    Labs:  CBC: Recent Labs    04/16/22 1022 09/12/22 1116 10/08/22 1430 10/15/22 0700  WBC 3.8* 3.3* 3.4* 3.7*  HGB 13.0 12.5 12.7 11.8*  HCT 40.3 39.1 40.9 40.4  PLT 192.0 205 203 182    COAGS: Recent Labs    09/12/22 1116 10/08/22 1430 10/15/22 0700  INR 1.0 1.0 1.0    BMP: Recent Labs    04/16/22 1022 09/12/22 1116 10/08/22 1430 10/15/22 0700  NA 139 144 142 141  K 4.3 4.4 3.8 5.5*  CL 105 110 110 112*  CO2 27 24 25 24   GLUCOSE 106* 98 95 81  BUN 13 16 15 15   CALCIUM 9.6 9.0 9.2 8.8*  CREATININE 0.91 1.01* 0.94 1.05*  GFRNONAA  --   --  >60 56*    LIVER FUNCTION TESTS: Recent Labs    12/31/21 1005 04/16/22 1022 09/12/22 1116 10/08/22 1430  10/15/22 0700  BILITOT 0.4 0.4 0.4 0.7 1.2  AST 18 21 14 18 28   ALT 11 11 8 12 8   ALKPHOS 80 92  --  78 86  PROT 7.4 7.8 7.0 7.6 7.2  ALBUMIN 4.1 4.4  --  4.0 3.7    TUMOR MARKERS: Recent Labs    09/12/22 1116  AFPTM 10.1*    Assessment and Plan:  Samantha [name] is a 71 yo female being seen today for image-guided microwave ablation of the liver and DEB-TACE procedure for hepatocellular carcinoma. The case has been reviewed and approved by Dr. Laurence Ferrari. Patient is to be admitted for 24 hour observation following the procedure.  Risks and benefits of microwave ablation of the liver were  discussed with the patient including, but not limited to bleeding, infection, liver failure, bile duct injury, pneumothorax or damage to adjacent structures.  Risks and benefits of DEB-TACE procedure were discussed with the patient including, but not limited to, bleeding, infection, vascular injury, contrast induced renal failure, post procedural pain, nausea, vomiting, fatigue, progression of liver failure, chemical cholecystitis, or need for additional procedures.  This interventional procedure involves the use of X-rays and because of the nature of the planned procedure, it is possible that we will have prolonged use of X-ray fluoroscopy.  Potential radiation risks to you include (but are not limited to) the following: - A slightly elevated risk for cancer  several years later in life. This risk is typically less than 0.5% percent. This risk is low in comparison to the normal incidence of human cancer, which is 33% for women and 50% for men according to the [place]. - Radiation induced injury can include skin redness, resembling a rash, tissue breakdown / ulcers and hair loss (which can be temporary or permanent).   The likelihood of either of these occurring depends on the difficulty of the procedure and whether you are sensitive to radiation due to previous procedures, disease, or genetic conditions.   IF your procedure requires a prolonged use of radiation, you will be notified and given written instructions for further action.  It is your responsibility to monitor the irradiated area for the 2 weeks following the procedure and to notify your physician if you are concerned that you have suffered a radiation induced injury.    All of the patient's questions were answered, patient is agreeable to proceed.  Consent signed and in chart.    Thank you for this interesting consult.  I greatly enjoyed meeting Samantha [name] and look forward to participating in their care.   A copy of this report was sent to the requesting provider on this date.  Electronically Signed: Lura Em, PA-C 10/15/2022, 8:11 AM   I spent a total of    25 Minutes in face to face in clinical consultation, greater than 50% of which was counseling/coordinating care for hepatocellular carcinoma.

## 2022-10-15 NOTE — Procedures (Signed)
Interventional Radiology Procedure Note  Procedure: Successful combined DEB-TACE and percutaneous microwave ablation.   Complications: None  Estimated Blood Loss: None  Recommendations: - to PACU - Admit for obs - Plan for DC tomorrow    Signed,  Criselda Peaches, MD

## 2022-10-15 NOTE — Transfer of Care (Signed)
Immediate Anesthesia Transfer of Care Note  Patient: Samantha [name]  Procedure(s) Performed: IR WITH ANESTHESIA MICROWAVE ABLATION OF LIVER  Patient Location: PACU  Anesthesia Type:General  Level of Consciousness: oriented, drowsy and patient cooperative  Airway & Oxygen Therapy: Patient Spontanous Breathing and Patient connected to face mask oxygen  Post-op Assessment: Report given to RN and Post -op Vital signs reviewed and stable  Post vital signs: Reviewed  Last Vitals:  Vitals Value Taken Time  BP 163/99 10/15/22 1317  Temp    Pulse 76 10/15/22 1319  Resp 13 10/15/22 1319  SpO2 100 % 10/15/22 1319  Vitals shown include unvalidated device data.  Last Pain:  Vitals:   10/15/22 0716  TempSrc: Oral         Complications: No notable events documented.

## 2022-10-16 ENCOUNTER — Encounter (HOSPITAL_COMMUNITY): Payer: Self-pay | Admitting: Interventional Radiology

## 2022-10-16 DIAGNOSIS — Z87891 Personal history of nicotine dependence: Secondary | ICD-10-CM | POA: Diagnosis not present

## 2022-10-16 DIAGNOSIS — I10 Essential (primary) hypertension: Secondary | ICD-10-CM | POA: Diagnosis not present

## 2022-10-16 DIAGNOSIS — Z79899 Other long term (current) drug therapy: Secondary | ICD-10-CM | POA: Diagnosis not present

## 2022-10-16 DIAGNOSIS — C220 Liver cell carcinoma: Secondary | ICD-10-CM | POA: Diagnosis not present

## 2022-10-16 DIAGNOSIS — Z7982 Long term (current) use of aspirin: Secondary | ICD-10-CM | POA: Diagnosis not present

## 2022-10-16 DIAGNOSIS — K219 Gastro-esophageal reflux disease without esophagitis: Secondary | ICD-10-CM | POA: Diagnosis not present

## 2022-10-16 DIAGNOSIS — K7581 Nonalcoholic steatohepatitis (NASH): Secondary | ICD-10-CM | POA: Diagnosis not present

## 2022-10-16 DIAGNOSIS — Z8619 Personal history of other infectious and parasitic diseases: Secondary | ICD-10-CM | POA: Diagnosis not present

## 2022-10-16 DIAGNOSIS — Z96653 Presence of artificial knee joint, bilateral: Secondary | ICD-10-CM | POA: Diagnosis not present

## 2022-10-16 DIAGNOSIS — K7460 Unspecified cirrhosis of liver: Secondary | ICD-10-CM | POA: Diagnosis not present

## 2022-10-16 LAB — COMPREHENSIVE METABOLIC PANEL
ALT: 138 U/L — ABNORMAL HIGH (ref 0–44)
AST: 160 U/L — ABNORMAL HIGH (ref 15–41)
Albumin: 3.2 g/dL — ABNORMAL LOW (ref 3.5–5.0)
Alkaline Phosphatase: 66 U/L (ref 38–126)
Anion gap: 4 — ABNORMAL LOW (ref 5–15)
BUN: 15 mg/dL (ref 8–23)
CO2: 24 mmol/L (ref 22–32)
Calcium: 8.4 mg/dL — ABNORMAL LOW (ref 8.9–10.3)
Chloride: 113 mmol/L — ABNORMAL HIGH (ref 98–111)
Creatinine, Ser: 0.97 mg/dL (ref 0.44–1.00)
GFR, Estimated: >60 mL/min (ref >60)
Glucose, Bld: 148 mg/dL — ABNORMAL HIGH (ref 70–99)
Potassium: 4.1 mmol/L (ref 3.5–5.1)
Sodium: 141 mmol/L (ref 135–145)
Total Bilirubin: 0.4 mg/dL (ref 0.3–1.2)
Total Protein: 6.2 g/dL — ABNORMAL LOW (ref 6.5–8.1)

## 2022-10-16 LAB — CBC
HCT: 37.2 % (ref 36.0–46.0)
Hemoglobin: 11.3 g/dL — ABNORMAL LOW (ref 12.0–15.0)
MCH: 26.9 pg (ref 26.0–34.0)
MCHC: 30.4 g/dL (ref 30.0–36.0)
MCV: 88.6 fL (ref 80.0–100.0)
Platelets: 177 10*3/uL (ref 150–400)
RBC: 4.2 MIL/uL (ref 3.87–5.11)
RDW: 13.2 % (ref 11.5–15.5)
WBC: 6.5 10*3/uL (ref 4.0–10.5)
nRBC: 0 % (ref 0.0–0.2)

## 2022-10-16 MED ORDER — OXYCODONE HCL 5 MG PO TABS: 5.0000 mg | ORAL_TABLET | ORAL | 0 refills | Status: AC | PRN

## 2022-10-16 NOTE — TOC Progression Note (Signed)
Transition of Care [facility]) - Progression Note    Patient Details  Name: Samantha [name] MRN: [id number] Date of Birth: [birth year]  [facility] of [facility]) CM/SW Contact  Henrietta Dine, RN Phone Number: 10/16/2022, 9:41 AM  Clinical Narrative:     Transition of Care [facility]) Screening Note   Patient Details  Name: Samantha [name] Date of Birth: [birth year]   Transition of Care [facility]) CM/SW Contact:    Henrietta Dine, RN Phone Number: 10/16/2022, 9:41 AM    Transition of Care Department [facility]) has reviewed patient and no TOC needs have been identified at this time. We will continue to monitor patient advancement through interdisciplinary progression rounds. If new patient transition needs arise, please place a TOC consult.          Expected Discharge Plan and Services                                                 Social Determinants of Health (SDOH) Interventions    Readmission Risk Interventions     No data to display

## 2022-10-16 NOTE — Discharge Summary (Signed)
Patient ID: Indigo Barbian MRN: [id number] DOB/AGE: Sep 11, 1951 71 y.o.  Admit date: 10/15/2022 Discharge date: 10/16/2022  Supervising Physician: Ruthann Cancer  Patient Status: [facility] - In-pt  Admission Diagnoses: Hepatocellular carcinoma   Discharge Diagnoses:  Principal Problem:   Hepatocellular carcinoma [facility])   Discharged Condition: good  Hospital Course:  Cierria Height is a 71 y.o. female with PMH of anemia, GERD, hepatitis C, NASH cirrhosis, hypertension, obesity, and liver cancer being seen today in relation to her hepatocellular carcinoma. The patient is known to IR from TACE procedure in February 2017 and CT-guided thermal ablation in March 2017. MRI performed 09/22/22 revealed a 1.5cm lesion concerning for recurrence of hepatocellular carcinoma.   She presented to the [place] [facility] department 10/15/22 for a combined DEB-TACE and percutaneous microwave ablation with Dr. Laurence Ferrari. She was admitted for overnight observation following the procedure.   She had an uneventful night and her pain is well-controlled with oral pain medicine. She has eaten, voided and will ambulate prior to discharge. She is eager to go home now. She will follow up with Dr. Laurence Ferrari in two weeks. CBC, CMP and PT/INR ordered in two weeks. A prescription for Oxycodone IR 5 mg Q 4 hours dispense #30 was e-prescribed to her [employer]. She knows she can call the clinic with any questions/concerns prior to her follow up visit with Dr. Laurence Ferrari. She will resume her home aspirin starting tomorrow.   Consults: None  Significant Diagnostic Studies: IR EMBO TUMOR ORGAN ISCHEMIA INFARCT INC GUIDE ROADMAPPING  Result Date: 10/15/2022 INDICATION: 71 year old female with a history of NASH cirrhosis complicated by hepatocellular carcinoma. She was previously treated for her initial hepatoma in spring of 2017 remained disease free until recently when she developed a new  enlarging LR 5 lesion in hepatic segment seven. She presents today for concurrent drug-eluting bead chemoembolization and percutaneous microwave ablation. EXAM: IR EMBO TUMOR ORGAN ISCHEMIA INFARCT INC GUIDE ROADMAPPING; ADDITIONAL ARTERIOGRAPHY; WORKSTATION 3D RECONSTRUCTION; IR ULTRASOUND GUIDANCE VASC ACCESS RIGHT; SELECTIVE VISCERAL ARTERIOGRAPHY MEDICATIONS: 2 g cefoxitin, 8 mg Decadron, 8 mg Zofran. The antibiotic was administered within 1 hour of the procedure ANESTHESIA/SEDATION: General anesthesia provided by the anesthesiology service. CONTRAST:  24m VISIPAQUE IODIXANOL 320 MG/ML IV SOLN, 259mVISIPAQUE IODIXANOL 320 MG/ML IV SOLN, 2067mISIPAQUE IODIXANOL 320 MG/ML IV SOLN, 45m64mSIPAQUE IODIXANOL 320 MG/ML IV SOLN, 45mL25mIPAQUE IODIXANOL 320 MG/ML IV SOLN, 45mL 40mPAQ.*contrast truncated* FLUOROSCOPY: Radiation Exposure Index (as provided by the fluoroscopic device): 3,281 3,235erma COMPLICATIONS: None immediate. PROCEDURE: Informed consent was obtained from the patient following explanation of the procedure, risks, benefits and alternatives. The patient understands, agrees and consents for the procedure. All questions were addressed. A time out was performed prior to the initiation of the procedure. Maximal barrier sterile technique utilized including caps, mask, sterile gowns, sterile gloves, large sterile drape, hand hygiene, and Betadine prep. The right common femoral artery was interrogated with ultrasound and found to be widely patent. An image was obtained and stored for the medical record. Local anesthesia was attained by infiltration with 1% lidocaine. A small dermatotomy was made. Under real-time sonographic guidance, the vessel was punctured with a 21 gauge micropuncture needle. Using standard technique, the initial micro needle was exchanged over a 0.018 micro wire for a transitional 4 [place] sheath. The micro sheath was then exchanged over a 0.035 wire for a 5 French vascular  sheath. A C2 cobra catheter was then advanced over a Bentson wire and used to select  the celiac artery. A celiac arteriogram was performed. Conventional hepatic arterial anatomy. The C2 catheter was then advanced into the right hepatic artery over a glidewire. Additional arteriography was performed. The segmental right hepatic arteries are very small and tortuous. Tumor blush is present in the expected location in segment 7. No tumor blush visualized in the previously treated lesion. No additional lesions identified. Additional independent 3D workstation reconstructions were performed. Lesion mapping software was utilized and it appears there are 2 independent arterial supplies to the lesion, 1 arising from segment 7 superiorly and 1 from segment 5 more inferiorly. A Cook cantata 2.5 French microcatheter was next advanced over a Fathom 16 wire and carefully advanced into the segment 7 hepatic artery. Arteriography and cone beam CT imaging was performed. Unfortunately, there is near stasis of contrast material within the segment 7 lesion. Intra arterial nitroglycerin was administered. Chemoembolization was then performed to near stasis utilizing 100-300 micron embospheres with doxorubicin. Next, the microcatheter was advanced into the segment 5 hepatic artery. Contrast injection was performed with cone beam CT imaging confirming opacification of the more inferior and anterior aspect of the lesion. Additional chemoembolization was then performed to near stasis. Follow-up arteriography from the right hepatic artery demonstrates no further contrast enhancement of the lesion. At this time, additional independent 3D workstation imaging was performed and target tracking software utilized to select a track from the anterior abdominal wall into the center mass of the lesion. A NeuWave 15 gauge PR XT probe was then carefully advanced along the planned trajectory and into the lesion. Additional cone beam CT confirms excellent  placement of the probe within the center mass of the tumor as planned. Percutaneous microwave ablation was then performed. The probe was powered at 65 watts and ablation was performed for a total of 7 minutes. The needle was removed and the tract ablated. Hemostasis was attained with the assistance of a Celt arterial closure device. IMPRESSION: 1. Successful chemoembolization of segment 7 lesion. 2. Successful concurrent percutaneous microwave ablation of segment 7 lesion. Electronically Signed   By: [name] M.D.   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 She was previously treated for her initial hepatoma in spring of 2017 remained disease free until recently when she developed a new enlarging LR 5 lesion in hepatic segment seven. She presents today for concurrent drug-eluting bead chemoembolization and percutaneous microwave ablation. EXAM: IR EMBO TUMOR ORGAN ISCHEMIA INFARCT INC GUIDE ROADMAPPING; ADDITIONAL ARTERIOGRAPHY; WORKSTATION 3D RECONSTRUCTION; IR ULTRASOUND GUIDANCE VASC ACCESS RIGHT; SELECTIVE VISCERAL ARTERIOGRAPHY MEDICATIONS: 2 g cefoxitin, 8 mg Decadron, 8 mg Zofran. The antibiotic was administered within 1 hour of the procedure ANESTHESIA/SEDATION: General anesthesia provided by the anesthesiology service. CONTRAST:  31m VISIPAQUE IODIXANOL 320 MG/ML IV SOLN, 267mVISIPAQUE IODIXANOL 320 MG/ML IV SOLN, 2023mISIPAQUE IODIXANOL 320 MG/ML IV SOLN, 31m68mSIPAQUE IODIXANOL 320 MG/ML IV SOLN, 31mL14mIPAQUE IODIXANOL 320 MG/ML IV SOLN, 31mL 53mPAQ.*contrast truncated* FLUOROSCOPY: Radiation Exposure Index (as provided by the fluoroscopic device): 3,281 2,956erma COMPLICATIONS: None immediate. PROCEDURE: Informed consent was obtained from the patient following explanation of the procedure, risks, benefits and alternatives. The patient understands,  agrees and consents for the procedure. All questions were addressed. A time out was performed prior to the initiation of the procedure. Maximal barrier sterile technique utilized including caps, mask, sterile gowns, sterile gloves, large sterile drape, hand hygiene, and Betadine prep. The  right common femoral artery was interrogated with ultrasound and found to be widely patent. An image was obtained and stored for the medical record. Local anesthesia was attained by infiltration with 1% lidocaine. A small dermatotomy was made. Under real-time sonographic guidance, the vessel was punctured with a 21 gauge micropuncture needle. Using standard technique, the initial micro needle was exchanged over a 0.018 micro wire for a transitional 4 [place] micro sheath. The micro sheath was then exchanged over a 0.035 wire for a 5 French vascular sheath. A C2 cobra catheter was then advanced over a Bentson wire and used to select the celiac artery. A celiac arteriogram was performed. Conventional hepatic arterial anatomy. The C2 catheter was then advanced into the right hepatic artery over a glidewire. Additional arteriography was performed. The segmental right hepatic arteries are very small and tortuous. Tumor blush is present in the expected location in segment 7. No tumor blush visualized in the previously treated lesion. No additional lesions identified. Additional independent 3D workstation reconstructions were performed. Lesion mapping software was utilized and it appears there are 2 independent arterial supplies to the lesion, 1 arising from segment 7 superiorly and 1 from segment 5 more inferiorly. A Cook cantata 2.5 French microcatheter was next advanced over a Fathom 16 wire and carefully advanced into the segment 7 hepatic artery. Arteriography and cone beam CT imaging was performed. Unfortunately, there is near stasis of contrast material within the segment 7 lesion. Intra arterial nitroglycerin was administered.  Chemoembolization was then performed to near stasis utilizing 100-300 micron embospheres with doxorubicin. Next, the microcatheter was advanced into the segment 5 hepatic artery. Contrast injection was performed with cone beam CT imaging confirming opacification of the more inferior and anterior aspect of the lesion. Additional chemoembolization was then performed to near stasis. Follow-up arteriography from the right hepatic artery demonstrates no further contrast enhancement of the lesion. At this time, additional independent 3D workstation imaging was performed and target tracking software utilized to select a track from the anterior abdominal wall into the center mass of the lesion. A NeuWave 15 gauge PR XT probe was then carefully advanced along the planned trajectory and into the lesion. Additional cone beam CT confirms excellent placement of the probe within the center mass of the tumor as planned. Percutaneous microwave ablation was then performed. The probe was powered at 65 watts and ablation was performed for a total of 7 minutes. The needle was removed and the tract ablated. Hemostasis was attained with the assistance of a Celt arterial closure device. IMPRESSION: 1. Successful chemoembolization of segment 7 lesion. 2. Successful concurrent percutaneous microwave ablation of segment 7 lesion. Electronically Signed   By: [name] M.D.   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 She was previously treated for her initial hepatoma in spring of 2017 remained disease free until recently when she developed a new enlarging LR 5 lesion in hepatic segment seven. She presents today for concurrent drug-eluting bead chemoembolization and percutaneous microwave ablation. EXAM: IR EMBO TUMOR ORGAN ISCHEMIA INFARCT INC GUIDE ROADMAPPING; ADDITIONAL ARTERIOGRAPHY;  WORKSTATION 3D RECONSTRUCTION; IR ULTRASOUND GUIDANCE VASC ACCESS RIGHT; SELECTIVE VISCERAL ARTERIOGRAPHY MEDICATIONS: 2 g cefoxitin, 8 mg Decadron, 8 mg Zofran. The antibiotic was administered within 1 hour of the procedure ANESTHESIA/SEDATION: General anesthesia provided by the anesthesiology service. CONTRAST:  79m VISIPAQUE IODIXANOL 320 MG/ML IV SOLN, 216mVISIPAQUE IODIXANOL 320  MG/ML IV SOLN, 15m VISIPAQUE IODIXANOL 320 MG/ML IV SOLN, 255mVISIPAQUE IODIXANOL 320 MG/ML IV SOLN, 2071mISIPAQUE IODIXANOL 320 MG/ML IV SOLN, 81m75mSIPAQ.*contrast truncated* FLUOROSCOPY: Radiation Exposure Index (as provided by the fluoroscopic device): 3,282,248 Kerma COMPLICATIONS: None immediate. PROCEDURE: Informed consent was obtained from the patient following explanation of the procedure, risks, benefits and alternatives. The patient understands, agrees and consents for the procedure. All questions were addressed. A time out was performed prior to the initiation of the procedure. Maximal barrier sterile technique utilized including caps, mask, sterile gowns, sterile gloves, large sterile drape, hand hygiene, and Betadine prep. The right common femoral artery was interrogated with ultrasound and found to be widely patent. An image was obtained and stored for the medical record. Local anesthesia was attained by infiltration with 1% lidocaine. A small dermatotomy was made. Under real-time sonographic guidance, the vessel was punctured with a 21 gauge micropuncture needle. Using standard technique, the initial micro needle was exchanged over a 0.018 micro wire for a transitional 4 [place] sheath. The micro sheath was then exchanged over a 0.035 wire for a 5 French vascular sheath. A C2 cobra catheter was then advanced over a Bentson wire and used to select the celiac artery. A celiac arteriogram was performed. Conventional hepatic arterial anatomy. The C2 catheter was then advanced into the right hepatic artery over a  glidewire. Additional arteriography was performed. The segmental right hepatic arteries are very small and tortuous. Tumor blush is present in the expected location in segment 7. No tumor blush visualized in the previously treated lesion. No additional lesions identified. Additional independent 3D workstation reconstructions were performed. Lesion mapping software was utilized and it appears there are 2 independent arterial supplies to the lesion, 1 arising from segment 7 superiorly and 1 from segment 5 more inferiorly. A Cook cantata 2.5 French microcatheter was next advanced over a Fathom 16 wire and carefully advanced into the segment 7 hepatic artery. Arteriography and cone beam CT imaging was performed. Unfortunately, there is near stasis of contrast material within the segment 7 lesion. Intra arterial nitroglycerin was administered. Chemoembolization was then performed to near stasis utilizing 100-300 micron embospheres with doxorubicin. Next, the microcatheter was advanced into the segment 5 hepatic artery. Contrast injection was performed with cone beam CT imaging confirming opacification of the more inferior and anterior aspect of the lesion. Additional chemoembolization was then performed to near stasis. Follow-up arteriography from the right hepatic artery demonstrates no further contrast enhancement of the lesion. At this time, additional independent 3D workstation imaging was performed and target tracking software utilized to select a track from the anterior abdominal wall into the center mass of the lesion. A NeuWave 15 gauge PR XT probe was then carefully advanced along the planned trajectory and into the lesion. Additional cone beam CT confirms excellent placement of the probe within the center mass of the tumor as planned. Percutaneous microwave ablation was then performed. The probe was powered at 65 watts and ablation was performed for a total of 7 minutes. The needle was removed and the tract  ablated. Hemostasis was attained with the assistance of a Celt arterial closure device. IMPRESSION: 1. Successful chemoembolization of segment 7 lesion. 2. Successful concurrent percutaneous microwave ablation of segment 7 lesion. Electronically Signed   By: HeatJacqulynn Cadet.   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 She was previously  treated for her initial hepatoma in spring of 2017 remained disease free until recently when she developed a new enlarging LR 5 lesion in hepatic segment seven. She presents today for concurrent drug-eluting bead chemoembolization and percutaneous microwave ablation. EXAM: IR EMBO TUMOR ORGAN ISCHEMIA INFARCT INC GUIDE ROADMAPPING; ADDITIONAL ARTERIOGRAPHY; WORKSTATION 3D RECONSTRUCTION; IR ULTRASOUND GUIDANCE VASC ACCESS RIGHT; SELECTIVE VISCERAL ARTERIOGRAPHY MEDICATIONS: 2 g cefoxitin, 8 mg Decadron, 8 mg Zofran. The antibiotic was administered within 1 hour of the procedure ANESTHESIA/SEDATION: General anesthesia provided by the anesthesiology service. CONTRAST:  58m VISIPAQUE IODIXANOL 320 MG/ML IV SOLN, 250mVISIPAQUE IODIXANOL 320 MG/ML IV SOLN, 2051mISIPAQUE IODIXANOL 320 MG/ML IV SOLN, 41m62mSIPAQUE IODIXANOL 320 MG/ML IV SOLN, 41mL52mIPAQUE IODIXANOL 320 MG/ML IV SOLN, 41mL 39mPAQ.*contrast truncated* FLUOROSCOPY: Radiation Exposure Index (as provided by the fluoroscopic device): 3,281 8,563erma COMPLICATIONS: None immediate. PROCEDURE: Informed consent was obtained from the patient following explanation of the procedure, risks, benefits and alternatives. The patient understands, agrees and consents for the procedure. All questions were addressed. A time out was performed prior to the initiation of the procedure. Maximal barrier sterile technique utilized including caps, mask, sterile gowns, sterile gloves, large  sterile drape, hand hygiene, and Betadine prep. The right common femoral artery was interrogated with ultrasound and found to be widely patent. An image was obtained and stored for the medical record. Local anesthesia was attained by infiltration with 1% lidocaine. A small dermatotomy was made. Under real-time sonographic guidance, the vessel was punctured with a 21 gauge micropuncture needle. Using standard technique, the initial micro needle was exchanged over a 0.018 micro wire for a transitional 4 [place] sheath. The micro sheath was then exchanged over a 0.035 wire for a 5 French vascular sheath. A C2 cobra catheter was then advanced over a Bentson wire and used to select the celiac artery. A celiac arteriogram was performed. Conventional hepatic arterial anatomy. The C2 catheter was then advanced into the right hepatic artery over a glidewire. Additional arteriography was performed. The segmental right hepatic arteries are very small and tortuous. Tumor blush is present in the expected location in segment 7. No tumor blush visualized in the previously treated lesion. No additional lesions identified. Additional independent 3D workstation reconstructions were performed. Lesion mapping software was utilized and it appears there are 2 independent arterial supplies to the lesion, 1 arising from segment 7 superiorly and 1 from segment 5 more inferiorly. A Cook cantata 2.5 French microcatheter was next advanced over a Fathom 16 wire and carefully advanced into the segment 7 hepatic artery. Arteriography and cone beam CT imaging was performed. Unfortunately, there is near stasis of contrast material within the segment 7 lesion. Intra arterial nitroglycerin was administered. Chemoembolization was then performed to near stasis utilizing 100-300 micron embospheres with doxorubicin. Next, the microcatheter was advanced into the segment 5 hepatic artery. Contrast injection was performed with cone beam CT imaging  confirming opacification of the more inferior and anterior aspect of the lesion. Additional chemoembolization was then performed to near stasis. Follow-up arteriography from the right hepatic artery demonstrates no further contrast enhancement of the lesion. At this time, additional independent 3D workstation imaging was performed and target tracking software utilized to select a track from the anterior abdominal wall into the center mass of the lesion. A NeuWave 15 gauge PR XT probe was then carefully advanced along the planned trajectory and into the lesion. Additional cone beam CT confirms excellent placement of the probe within the center mass of the  tumor as planned. Percutaneous microwave ablation was then performed. The probe was powered at 65 watts and ablation was performed for a total of 7 minutes. The needle was removed and the tract ablated. Hemostasis was attained with the assistance of a Celt arterial closure device. IMPRESSION: 1. Successful chemoembolization of segment 7 lesion. 2. Successful concurrent percutaneous microwave ablation of segment 7 lesion. Electronically Signed   By: [name] M.D.   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 She was previously treated for her initial hepatoma in spring of 2017 remained disease free until recently when she developed a new enlarging LR 5 lesion in hepatic segment seven. She presents today for concurrent drug-eluting bead chemoembolization and percutaneous microwave ablation. EXAM: IR EMBO TUMOR ORGAN ISCHEMIA INFARCT INC GUIDE ROADMAPPING; ADDITIONAL ARTERIOGRAPHY; WORKSTATION 3D RECONSTRUCTION; IR ULTRASOUND GUIDANCE VASC ACCESS RIGHT; SELECTIVE VISCERAL ARTERIOGRAPHY MEDICATIONS: 2 g cefoxitin, 8 mg Decadron, 8 mg Zofran. The antibiotic was administered within 1 hour of the procedure  ANESTHESIA/SEDATION: General anesthesia provided by the anesthesiology service. CONTRAST:  14m VISIPAQUE IODIXANOL 320 MG/ML IV SOLN, 269mVISIPAQUE IODIXANOL 320 MG/ML IV SOLN, 2026mISIPAQUE IODIXANOL 320 MG/ML IV SOLN, 54m53mSIPAQUE IODIXANOL 320 MG/ML IV SOLN, 54mL22mIPAQUE IODIXANOL 320 MG/ML IV SOLN, 54mL 82mPAQ.*contrast truncated* FLUOROSCOPY: Radiation Exposure Index (as provided by the fluoroscopic device): 3,281 0,355erma COMPLICATIONS: None immediate. PROCEDURE: Informed consent was obtained from the patient following explanation of the procedure, risks, benefits and alternatives. The patient understands, agrees and consents for the procedure. All questions were addressed. A time out was performed prior to the initiation of the procedure. Maximal barrier sterile technique utilized including caps, mask, sterile gowns, sterile gloves, large sterile drape, hand hygiene, and Betadine prep. The right common femoral artery was interrogated with ultrasound and found to be widely patent. An image was obtained and stored for the medical record. Local anesthesia was attained by infiltration with 1% lidocaine. A small dermatotomy was made. Under real-time sonographic guidance, the vessel was punctured with a 21 gauge micropuncture needle. Using standard technique, the initial micro needle was exchanged over a 0.018 micro wire for a transitional 4 [place] sheath. The micro sheath was then exchanged over a 0.035 wire for a 5 French vascular sheath. A C2 cobra catheter was then advanced over a Bentson wire and used to select the celiac artery. A celiac arteriogram was performed. Conventional hepatic arterial anatomy. The C2 catheter was then advanced into the right hepatic artery over a glidewire. Additional arteriography was performed. The segmental right hepatic arteries are very small and tortuous. Tumor blush is present in the expected location in segment 7. No tumor blush visualized in the previously  treated lesion. No additional lesions identified. Additional independent 3D workstation reconstructions were performed. Lesion mapping software was utilized and it appears there are 2 independent arterial supplies to the lesion, 1 arising from segment 7 superiorly and 1 from segment 5 more inferiorly. A Cook cantata 2.5 French microcatheter was next advanced over a Fathom 16 wire and carefully advanced into the segment 7 hepatic artery. Arteriography and cone beam CT imaging was performed. Unfortunately, there is near stasis of contrast material within the segment 7 lesion. Intra arterial nitroglycerin was administered. Chemoembolization was then performed to near stasis utilizing 100-300 micron embospheres with doxorubicin. Next, the microcatheter was advanced into the segment 5 hepatic artery. Contrast injection was performed  with cone beam CT imaging confirming opacification of the more inferior and anterior aspect of the lesion. Additional chemoembolization was then performed to near stasis. Follow-up arteriography from the right hepatic artery demonstrates no further contrast enhancement of the lesion. At this time, additional independent 3D workstation imaging was performed and target tracking software utilized to select a track from the anterior abdominal wall into the center mass of the lesion. A NeuWave 15 gauge PR XT probe was then carefully advanced along the planned trajectory and into the lesion. Additional cone beam CT confirms excellent placement of the probe within the center mass of the tumor as planned. Percutaneous microwave ablation was then performed. The probe was powered at 65 watts and ablation was performed for a total of 7 minutes. The needle was removed and the tract ablated. Hemostasis was attained with the assistance of a Celt arterial closure device. IMPRESSION: 1. Successful chemoembolization of segment 7 lesion. 2. Successful concurrent percutaneous microwave ablation of segment 7  lesion. Electronically Signed   By: [name] M.D.   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 She was previously treated for her initial hepatoma in spring of 2017 remained disease free until recently when she developed a new enlarging LR 5 lesion in hepatic segment seven. She presents today for concurrent drug-eluting bead chemoembolization and percutaneous microwave ablation. EXAM: IR EMBO TUMOR ORGAN ISCHEMIA INFARCT INC GUIDE ROADMAPPING; ADDITIONAL ARTERIOGRAPHY; WORKSTATION 3D RECONSTRUCTION; IR ULTRASOUND GUIDANCE VASC ACCESS RIGHT; SELECTIVE VISCERAL ARTERIOGRAPHY MEDICATIONS: 2 g cefoxitin, 8 mg Decadron, 8 mg Zofran. The antibiotic was administered within 1 hour of the procedure ANESTHESIA/SEDATION: General anesthesia provided by the anesthesiology service. CONTRAST:  56m VISIPAQUE IODIXANOL 320 MG/ML IV SOLN, 262mVISIPAQUE IODIXANOL 320 MG/ML IV SOLN, 2064mISIPAQUE IODIXANOL 320 MG/ML IV SOLN, 14m59mSIPAQUE IODIXANOL 320 MG/ML IV SOLN, 14mL50mIPAQUE IODIXANOL 320 MG/ML IV SOLN, 14mL 28mPAQ.*contrast truncated* FLUOROSCOPY: Radiation Exposure Index (as provided by the fluoroscopic device): 3,281 0,973erma COMPLICATIONS: None immediate. PROCEDURE: Informed consent was obtained from the patient following explanation of the procedure, risks, benefits and alternatives. The patient understands, agrees and consents for the procedure. All questions were addressed. A time out was performed prior to the initiation of the procedure. Maximal barrier sterile technique utilized including caps, mask, sterile gowns, sterile gloves, large sterile drape, hand hygiene, and Betadine prep. The right common femoral artery was interrogated with ultrasound and found to be widely patent. An image was obtained and stored for the medical record. Local  anesthesia was attained by infiltration with 1% lidocaine. A small dermatotomy was made. Under real-time sonographic guidance, the vessel was punctured with a 21 gauge micropuncture needle. Using standard technique, the initial micro needle was exchanged over a 0.018 micro wire for a transitional 4 [place] sheath. The micro sheath was then exchanged over a 0.035 wire for a 5 French vascular sheath. A C2 cobra catheter was then advanced over a Bentson wire and used to select the celiac artery. A celiac arteriogram was performed. Conventional hepatic arterial anatomy. The C2 catheter was then advanced into the right hepatic artery over a glidewire. Additional arteriography was performed. The segmental right hepatic arteries are very small and tortuous. Tumor blush is present in the expected location in segment 7. No tumor blush visualized in the previously treated lesion. No additional lesions identified. Additional independent 3D workstation reconstructions were performed. Lesion  mapping software was utilized and it appears there are 2 independent arterial supplies to the lesion, 1 arising from segment 7 superiorly and 1 from segment 5 more inferiorly. A Cook cantata 2.5 French microcatheter was next advanced over a Fathom 16 wire and carefully advanced into the segment 7 hepatic artery. Arteriography and cone beam CT imaging was performed. Unfortunately, there is near stasis of contrast material within the segment 7 lesion. Intra arterial nitroglycerin was administered. Chemoembolization was then performed to near stasis utilizing 100-300 micron embospheres with doxorubicin. Next, the microcatheter was advanced into the segment 5 hepatic artery. Contrast injection was performed with cone beam CT imaging confirming opacification of the more inferior and anterior aspect of the lesion. Additional chemoembolization was then performed to near stasis. Follow-up arteriography from the right hepatic artery demonstrates  no further contrast enhancement of the lesion. At this time, additional independent 3D workstation imaging was performed and target tracking software utilized to select a track from the anterior abdominal wall into the center mass of the lesion. A NeuWave 15 gauge PR XT probe was then carefully advanced along the planned trajectory and into the lesion. Additional cone beam CT confirms excellent placement of the probe within the center mass of the tumor as planned. Percutaneous microwave ablation was then performed. The probe was powered at 65 watts and ablation was performed for a total of 7 minutes. The needle was removed and the tract ablated. Hemostasis was attained with the assistance of a Celt arterial closure device. IMPRESSION: 1. Successful chemoembolization of segment 7 lesion. 2. Successful concurrent percutaneous microwave ablation of segment 7 lesion. Electronically Signed   By: [name] M.D.   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 She was previously treated for her initial hepatoma in spring of 2017 remained disease free until recently when she developed a new enlarging LR 5 lesion in hepatic segment seven. She presents today for concurrent drug-eluting bead chemoembolization and percutaneous microwave ablation. EXAM: IR EMBO TUMOR ORGAN ISCHEMIA INFARCT INC GUIDE ROADMAPPING; ADDITIONAL ARTERIOGRAPHY; WORKSTATION 3D RECONSTRUCTION; IR ULTRASOUND GUIDANCE VASC ACCESS RIGHT; SELECTIVE VISCERAL ARTERIOGRAPHY MEDICATIONS: 2 g cefoxitin, 8 mg Decadron, 8 mg Zofran. The antibiotic was administered within 1 hour of the procedure ANESTHESIA/SEDATION: General anesthesia provided by the anesthesiology service. CONTRAST:  1m VISIPAQUE IODIXANOL 320 MG/ML IV SOLN, 257mVISIPAQUE IODIXANOL 320 MG/ML IV SOLN, 2057mISIPAQUE IODIXANOL 320  MG/ML IV SOLN, 50m56mSIPAQUE IODIXANOL 320 MG/ML IV SOLN, 50mL3mIPAQUE IODIXANOL 320 MG/ML IV SOLN, 50mL 24mPAQ.*contrast truncated* FLUOROSCOPY: Radiation Exposure Index (as provided by the fluoroscopic device): 3,281 9,326erma COMPLICATIONS: None immediate. PROCEDURE: Informed consent was obtained from the patient following explanation of the procedure, risks, benefits and alternatives. The patient understands, agrees and consents for the procedure. All questions were addressed. A time out was performed prior to the initiation of the procedure. Maximal barrier sterile technique utilized including caps, mask, sterile gowns, sterile gloves, large sterile drape, hand hygiene, and Betadine prep. The right common femoral artery was interrogated with ultrasound and found to be widely patent. An image was obtained and stored for the medical record. Local anesthesia was attained by infiltration with 1% lidocaine. A small dermatotomy was made. Under real-time sonographic guidance, the vessel was punctured with a 21 gauge micropuncture needle. Using standard technique, the initial micro needle was exchanged over a 0.018 micro wire for a transitional 4  French micro sheath. The micro sheath was then exchanged over a 0.035 wire for a 5 French vascular sheath. A C2 cobra catheter was then advanced over a Bentson wire and used to select the celiac artery. A celiac arteriogram was performed. Conventional hepatic arterial anatomy. The C2 catheter was then advanced into the right hepatic artery over a glidewire. Additional arteriography was performed. The segmental right hepatic arteries are very small and tortuous. Tumor blush is present in the expected location in segment 7. No tumor blush visualized in the previously treated lesion. No additional lesions identified. Additional independent 3D workstation reconstructions were performed. Lesion mapping software was utilized and it appears there are 2 independent arterial  supplies to the lesion, 1 arising from segment 7 superiorly and 1 from segment 5 more inferiorly. A Cook cantata 2.5 French microcatheter was next advanced over a Fathom 16 wire and carefully advanced into the segment 7 hepatic artery. Arteriography and cone beam CT imaging was performed. Unfortunately, there is near stasis of contrast material within the segment 7 lesion. Intra arterial nitroglycerin was administered. Chemoembolization was then performed to near stasis utilizing 100-300 micron embospheres with doxorubicin. Next, the microcatheter was advanced into the segment 5 hepatic artery. Contrast injection was performed with cone beam CT imaging confirming opacification of the more inferior and anterior aspect of the lesion. Additional chemoembolization was then performed to near stasis. Follow-up arteriography from the right hepatic artery demonstrates no further contrast enhancement of the lesion. At this time, additional independent 3D workstation imaging was performed and target tracking software utilized to select a track from the anterior abdominal wall into the center mass of the lesion. A NeuWave 15 gauge PR XT probe was then carefully advanced along the planned trajectory and into the lesion. Additional cone beam CT confirms excellent placement of the probe within the center mass of the tumor as planned. Percutaneous microwave ablation was then performed. The probe was powered at 65 watts and ablation was performed for a total of 7 minutes. The needle was removed and the tract ablated. Hemostasis was attained with the assistance of a Celt arterial closure device. IMPRESSION: 1. Successful chemoembolization of segment 7 lesion. 2. Successful concurrent percutaneous microwave ablation of segment 7 lesion. Electronically Signed   By: [name] M.D.   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 She was previously treated for her initial hepatoma in spring of 2017 remained disease free until recently when she developed a new enlarging LR 5 lesion in hepatic segment seven. She presents today for concurrent drug-eluting bead chemoembolization and percutaneous microwave ablation. EXAM: IR EMBO TUMOR ORGAN ISCHEMIA INFARCT INC GUIDE ROADMAPPING; ADDITIONAL ARTERIOGRAPHY; WORKSTATION 3D RECONSTRUCTION; IR ULTRASOUND GUIDANCE VASC ACCESS RIGHT; SELECTIVE VISCERAL ARTERIOGRAPHY MEDICATIONS: 2 g cefoxitin, 8 mg Decadron, 8 mg Zofran. The antibiotic was administered within 1 hour of the procedure ANESTHESIA/SEDATION: General anesthesia provided by the anesthesiology service. CONTRAST:  91m VISIPAQUE IODIXANOL 320 MG/ML IV SOLN, 231mVISIPAQUE IODIXANOL 320 MG/ML IV SOLN, 2045mISIPAQUE IODIXANOL 320 MG/ML IV SOLN, 21m38mSIPAQUE IODIXANOL 320 MG/ML IV SOLN, 21mL71mIPAQUE IODIXANOL 320 MG/ML IV SOLN, 21mL 25mPAQ.*contrast truncated* FLUOROSCOPY: Radiation Exposure Index (as provided by the fluoroscopic device): 3,281 4,944erma COMPLICATIONS: None immediate. PROCEDURE: Informed consent was obtained from the patient following explanation of the procedure, risks, benefits and alternatives. The patient understands, agrees and consents for the procedure. All  questions were addressed. A time out was performed prior to the initiation of the procedure. Maximal barrier sterile technique utilized including caps, mask, sterile gowns, sterile gloves, large sterile drape, hand hygiene, and Betadine prep. The right common femoral artery was interrogated with ultrasound and found to be widely patent. An image was obtained and stored for the medical record. Local anesthesia was attained by infiltration with 1% lidocaine. A small dermatotomy was made. Under real-time sonographic guidance, the vessel was punctured with a 21 gauge micropuncture needle. Using standard technique,  the initial micro needle was exchanged over a 0.018 micro wire for a transitional 4 [place] micro sheath. The micro sheath was then exchanged over a 0.035 wire for a 5 French vascular sheath. A C2 cobra catheter was then advanced over a Bentson wire and used to select the celiac artery. A celiac arteriogram was performed. Conventional hepatic arterial anatomy. The C2 catheter was then advanced into the right hepatic artery over a glidewire. Additional arteriography was performed. The segmental right hepatic arteries are very small and tortuous. Tumor blush is present in the expected location in segment 7. No tumor blush visualized in the previously treated lesion. No additional lesions identified. Additional independent 3D workstation reconstructions were performed. Lesion mapping software was utilized and it appears there are 2 independent arterial supplies to the lesion, 1 arising from segment 7 superiorly and 1 from segment 5 more inferiorly. A Cook cantata 2.5 French microcatheter was next advanced over a Fathom 16 wire and carefully advanced into the segment 7 hepatic artery. Arteriography and cone beam CT imaging was performed. Unfortunately, there is near stasis of contrast material within the segment 7 lesion. Intra arterial nitroglycerin was administered. Chemoembolization was then performed to near stasis utilizing 100-300 micron embospheres with doxorubicin. Next, the microcatheter was advanced into the segment 5 hepatic artery. Contrast injection was performed with cone beam CT imaging confirming opacification of the more inferior and anterior aspect of the lesion. Additional chemoembolization was then performed to near stasis. Follow-up arteriography from the right hepatic artery demonstrates no further contrast enhancement of the lesion. At this time, additional independent 3D workstation imaging was performed and target tracking software utilized to select a track from the anterior abdominal wall into  the center mass of the lesion. A NeuWave 15 gauge PR XT probe was then carefully advanced along the planned trajectory and into the lesion. Additional cone beam CT confirms excellent placement of the probe within the center mass of the tumor as planned. Percutaneous microwave ablation was then performed. The probe was powered at 65 watts and ablation was performed for a total of 7 minutes. The needle was removed and the tract ablated. Hemostasis was attained with the assistance of a Celt arterial closure device. IMPRESSION: 1. Successful chemoembolization of segment 7 lesion. 2. Successful concurrent percutaneous microwave ablation of segment 7 lesion. Electronically Signed   By: [name] M.D.   On: 10/15/2022 16:39   IR Radiologist Eval & Mgmt  Result Date: 09/23/2022 Please refer to notes tab for details about interventional procedure. (Op Note)  MR ABDOMEN W WO CONTRAST  Result Date: 09/23/2022 CLINICAL DATA:  Cirrhosis with history of hepatocellular carcinoma treated 6 years ago with chemoembolization. Follow-up of liver lesion EXAM: MRI ABDOMEN WITHOUT AND WITH CONTRAST TECHNIQUE: Multiplanar multisequence MR imaging of the abdomen was performed both before and after the administration of intravenous contrast. CONTRAST:  10 cc Vueway COMPARISON:  06/03/2022 FINDINGS: Lower chest: Mild cardiomegaly, without pericardial or pleural effusion.  A right cardiophrenic angle mildly enlarged node of 6 mm is similar and likely reactive. Hepatobiliary: Moderate cirrhosis. The ablation defect within the peripheral portion of segment 8 and less so 5 demonstrates no evidence of local recurrence. Measures on the order of 2.6 x 2.2 cm on 53/803. Slightly decreased from 3.2 x 2.1 cm on the prior exam. Again identified is an observation within segment 7. Measures 1.5 x 1.0 cm on 36/801. Compare 1.0 x 0.9 cm on the prior exam (when remeasured). This demonstrates T2 hyperintensity on 09/04, restricted diffusion on  09/550. Arterial phase non rim enhancement including on 35/802. Delayed phase equivocal washout medially including on 33/804. No enhancing capsule. No new lesion. Cholecystectomy, without biliary ductal dilatation. Pancreas:  Normal, without mass or ductal dilatation. Spleen:  Normal in size, without focal abnormality. Adrenals/Urinary Tract: Normal adrenal glands. Tiny lesions in both kidneys are too small to characterize but most likely cysts. No hydronephrosis. Stomach/Bowel: Normal stomach and small bowel. Scattered colonic diverticula. Vascular/Lymphatic: Aortic atherosclerosis. No splenic artery aneurysm. Patent portal and hepatic veins. No specific evidence of portal venous hypertension. No retroperitoneal or retrocrural adenopathy. Other:  No ascites. Musculoskeletal: No acute osseous abnormality. IMPRESSION: 1. The segment 7 1.5 cm observation demonstrates arterial phase non rim enhancement and threshold growth. Considered LR 5. 2. No evidence of local recurrence at the ablation site. 3. No evidence of abdominal metastatic disease. Electronically Signed   By: Abigail Miyamoto M.D.   On: 09/23/2022 09:08    Treatments: Observation; pain management.   Discharge Exam: Blood pressure (!) 149/78, pulse 86, temperature 98.8 F (37.1 C), temperature source Oral, resp. rate 18, height 5' (1.524 m), weight 290 lb 12.8 oz (131.9 kg), SpO2 99 %. Physical Exam Constitutional:      General: She is not in acute distress.    Appearance: She is obese. She is not ill-appearing.  HENT:     Mouth/Throat:     Mouth: Mucous membranes are moist.     Pharynx: Oropharynx is clear.  Cardiovascular:     Pulses: Normal pulses.  Pulmonary:     Effort: Pulmonary effort is normal.  Abdominal:     Palpations: Abdomen is soft.     Tenderness: There is abdominal tenderness.     Comments: RUQ procedure site covered with gauze/tegaderm. Gauze is clean/dry. Site is tender to palpation.   Skin:    General: Skin is warm and  dry.  Neurological:     Mental Status: She is alert and oriented to person, place, and time.     Disposition: Discharge disposition: 01-Home or Self Care        Allergies as of 10/16/2022       Reactions   Amlodipine Swelling, Other (See Comments)   Edema whole body, especially in feet   Ace Inhibitors Cough   Benazepril Hcl Cough   Codeine Itching        Medication List     TAKE these medications    ascorbic acid 500 MG tablet Commonly known as: VITAMIN C Take 500 mg by mouth daily.   aspirin EC 81 MG tablet Take 81 mg by mouth daily.   BLACK COHOSH EXTRACT PO Take 1 tablet by mouth daily.   Cholecalciferol 25 MCG (1000 UT) tablet Take 1,000 Units by mouth daily.   furosemide 20 MG tablet Commonly known as: LASIX Take 1-2 tablets (20-40 mg total) by mouth daily. What changed: how much to take   glucosamine-chondroitin 500-400 MG tablet Take 1  tablet by mouth daily.   loratadine 10 MG tablet Commonly known as: EQ Allergy Relief Take 1 tablet by mouth once daily   losartan 100 MG tablet Commonly known as: COZAAR Take 1 tablet by mouth once daily   meloxicam 7.5 MG tablet Commonly known as: MOBIC TAKE 1 TO 2 TABLETS BY MOUTH ONCE DAILY AS NEEDED FOR PAIN   multivitamin with minerals Tabs tablet Take 1 tablet by mouth daily.   oxyCODONE 5 MG immediate release tablet Commonly known as: Oxy IR/ROXICODONE Take 1 tablet (5 mg total) by mouth every 4 (four) hours as needed for up to 5 days for severe pain.   pantoprazole 40 MG tablet Commonly known as: PROTONIX Take 1 tablet (40 mg total) by mouth daily.   potassium chloride SA 20 MEQ tablet Commonly known as: KLOR-CON M Take 1 tablet (20 mEq total) by mouth daily.   silver sulfADIAZINE 1 % cream Commonly known as: SILVADENE Apply 1 application topically daily.   trolamine salicylate 10 % cream Commonly known as: ASPERCREME Apply 1 Application topically as needed for muscle pain.         Follow-up Information     [facility] Follow up.   Why: Please follow up with Dr. Laurence Ferrari in two weeks. A scheduler from our office will call you with a date/time of your appointment. Please call our office with any questions prior to your visit. Contact information: [place] [address] [phone]                  Electronically Signed: Theresa Duty, NP 10/16/2022, 11:13 AM   I have spent Less Than 30 Minutes discharging Karyssa Amaral.

## 2022-10-16 NOTE — Care Management CC44 (Signed)
Condition Code 44 Documentation Completed  Patient Details  Name: Samantha [name] MRN: [id number] Date of Birth: [birth year]   Condition Code 44 given:  Yes Patient signature on Condition Code 44 notice:  Yes Documentation of 2 MD's agreement:  Yes Code 44 added to claim:  Yes    Henrietta Dine, RN 10/16/2022, 9:17 AM

## 2022-10-16 NOTE — Care Management Obs Status (Signed)
[facility]ISH NOTIFICATION   Patient Details  Name: Samantha [name] MRN: [id number] Date of Birth: 1951/11/22   Medicare Observation Status Notification Given:  Yes    Henrietta Dine, RN 10/16/2022, 9:19 AM

## 2022-10-20 ENCOUNTER — Encounter: Payer: Self-pay | Admitting: Interventional Radiology

## 2022-10-20 ENCOUNTER — Other Ambulatory Visit: Payer: Self-pay | Admitting: Interventional Radiology

## 2022-10-20 DIAGNOSIS — K769 Liver disease, unspecified: Secondary | ICD-10-CM

## 2022-10-25 ENCOUNTER — Other Ambulatory Visit: Payer: Self-pay | Admitting: Internal Medicine

## 2022-10-27 ENCOUNTER — Other Ambulatory Visit (HOSPITAL_COMMUNITY): Payer: Self-pay | Admitting: Interventional Radiology

## 2022-10-27 DIAGNOSIS — C220 Liver cell carcinoma: Secondary | ICD-10-CM | POA: Diagnosis not present

## 2022-10-28 LAB — CBC
HCT: 37.3 % (ref 35.0–45.0)
Hemoglobin: 11.8 g/dL (ref 11.7–15.5)
MCH: 26.7 pg — ABNORMAL LOW (ref 27.0–33.0)
MCHC: 31.6 g/dL — ABNORMAL LOW (ref 32.0–36.0)
MCV: 84.4 fL (ref 80.0–100.0)
MPV: 10.4 fL (ref 7.5–12.5)
Platelets: 289 10*3/uL (ref 140–400)
RBC: 4.42 10*6/uL (ref 3.80–5.10)
RDW: 12.3 % (ref 11.0–15.0)
WBC: 4.1 10*3/uL (ref 3.8–10.8)

## 2022-10-28 LAB — COMPLETE METABOLIC PANEL WITH GFR
AG Ratio: 1.2 (calc) (ref 1.0–2.5)
ALT: 29 U/L (ref 6–29)
AST: 17 U/L (ref 10–35)
Albumin: 3.9 g/dL (ref 3.6–5.1)
Alkaline phosphatase (APISO): 99 U/L (ref 37–153)
BUN: 13 mg/dL (ref 7–25)
CO2: 22 mmol/L (ref 20–32)
Calcium: 9.2 mg/dL (ref 8.6–10.4)
Chloride: 108 mmol/L (ref 98–110)
Creat: 0.93 mg/dL (ref 0.60–1.00)
Globulin: 3.2 g/dL (calc) (ref 1.9–3.7)
Glucose, Bld: 96 mg/dL (ref 65–99)
Potassium: 4.5 mmol/L (ref 3.5–5.3)
Sodium: 142 mmol/L (ref 135–146)
Total Bilirubin: 0.4 mg/dL (ref 0.2–1.2)
Total Protein: 7.1 g/dL (ref 6.1–8.1)
eGFR: 65 mL/min/{1.73_m2} (ref >=60)

## 2022-10-28 LAB — PROTIME-INR
INR: 1
Prothrombin Time: 10.3 s (ref 9.0–11.5)

## 2022-10-30 ENCOUNTER — Other Ambulatory Visit: Payer: Self-pay | Admitting: Internal Medicine

## 2022-11-03 ENCOUNTER — Ambulatory Visit
Admission: RE | Admit: 2022-11-03 | Discharge: 2022-11-03 | Disposition: A | Discharge status: Home / Self Care | Payer: Medicare Other | Source: Ambulatory Visit | Attending: Student | Admitting: Student

## 2022-11-03 ENCOUNTER — Encounter: Payer: Self-pay | Admitting: Lab

## 2022-11-03 DIAGNOSIS — C220 Liver cell carcinoma: Secondary | ICD-10-CM | POA: Diagnosis not present

## 2022-11-03 DIAGNOSIS — K7581 Nonalcoholic steatohepatitis (NASH): Secondary | ICD-10-CM | POA: Diagnosis not present

## 2022-11-03 DIAGNOSIS — K769 Liver disease, unspecified: Secondary | ICD-10-CM

## 2022-11-03 HISTORY — PX: IR RADIOLOGIST EVAL & MGMT: IMG5224

## 2022-11-03 NOTE — Progress Notes (Signed)
Chief Complaint: Patient was consulted remotely today (TeleHealth) for cirrhosis complicated by [facility] at the request of Covington,Jamie R.    Referring Physician(s): Covington,Jamie R  History of Present Illness: Samantha [name] is a 71 y.o. 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   She underwent conventional lipiodol transarterial chemo embolization on 2/23 and then subsequent CT guided thermal ablation of this lesion on 03/21/2016.   She presents today for continued follow-up evaluation.     MRI 08/29/21:  Stable ablation defect in anterior right hepatic lobe. No evidence of recurrent carcinoma.   4 mm focus of arterial phase hyperenhancement in the lateral dome of the right hepatic lobe appears slightly more conspicuous than on prior exam, but is difficult to characterize due to its small size. Recommend continued follow-up by abdomen MRI without and with contrast in 6 months.   MRI 11/1821: 1. Similar appearance of ablation defect within segment 6/8, without local recurrence. 2. Suspect mild cirrhosis. 3. Similar size of a segment 7 observation which demonstrates arterial and persistent contrast enhancement. LR 3.   MRI 06/03/22 1. Stable segment 8/5 ablation defect. No findings suspicious for recurrent tumor. 2. Interval enlargement of the segment 7 lesion now measuring 11 mm.  Interval growth suggests a LI-RADS 4 lesion. Recommend future studies on the 3T Skyra magnet for optimum evaluation.   MRI 09/22/22 1. The segment 7 1.5 cm observation demonstrates arterial phase non rim enhancement and threshold growth. Considered LR 5.   She subsequently underwent combined DEB-TACE and microwave ablation on 10/16/2022.  She was admitted overnight for observation and discharged home the following morning in good condition.  Her recovery was uneventful.  We spoke over the  phone today for her 2-week follow-up evaluation.  She is doing well and in her usual state of good health.  She has no active complaints at this time.  Past Medical History:  Diagnosis Date   Allergy    seasonal   Anemia    Cataract    bilateral - MD just watching   Cirrhosis ([place]) 2018   Diverticulosis    Edema    Elevated glucose    no longer an issue 03-18-16   Fatty liver 2017   GERD (gastroesophageal reflux disease)    H/O seasonal allergies    Hepatitis C    hepatitis c completed  tx march 2017   Hyperlipidemia 2011   Hypertension    LBP (low back pain)    Liver cancer ([place]) 2017   IN REMISSION. WHEN IDAGNONED SIZE OF GRAPEFRUIT    Morbid obesity ([place])    Osteoarthritis    B knees   Right middle lobe pulmonary nodule    resolved 2018   Uterine fibroid    Wears glasses     Past Surgical History:  Procedure Laterality Date   BIOPSY  07/04/2019   Procedure: BIOPSY;  Surgeon: Jerene Bears, MD;  Location: [facility];  Service: Gastroenterology;;   BIOPSY  07/11/2020   Procedure: BIOPSY;  Surgeon: Lavena Bullion, DO;  Location: [facility];  Service: Gastroenterology;;   CHOLECYSTECTOMY     COLONOSCOPY N/A 01/09/2015   Procedure: COLONOSCOPY;  Surgeon: Jerene Bears, MD;  Location: [facility];  Service: Gastroenterology;  Laterality: N/A;   COLONOSCOPY WITH PROPOFOL N/A 06/06/2020   Procedure: COLONOSCOPY WITH PROPOFOL;  Surgeon: Lavena Bullion, DO;  Location: [facility];  Service: Gastroenterology;  Laterality: N/A;   COLONOSCOPY WITH PROPOFOL N/A 07/11/2020   Procedure: COLONOSCOPY WITH PROPOFOL;  Surgeon: Lavena Bullion, DO;  [facility]: [facility];  Service: Gastroenterology;  Laterality: N/A;   ESOPHAGOGASTRODUODENOSCOPY (EGD) WITH PROPOFOL N/A 06/10/2016   Procedure: ESOPHAGOGASTRODUODENOSCOPY (EGD) WITH PROPOFOL;  Surgeon: Jerene Bears, MD;  [facility];  Service: Gastroenterology;  Laterality: N/A;   ESOPHAGOGASTRODUODENOSCOPY (EGD) WITH  PROPOFOL N/A 07/04/2019   Procedure: ESOPHAGOGASTRODUODENOSCOPY (EGD) WITH PROPOFOL;  Surgeon: Jerene Bears, MD;  [facility];  Service: Gastroenterology;  Laterality: N/A;   HYSTEROSCOPY WITH D & C N/A 10/15/2018   Procedure: DILATATION AND CURETTAGE /HYSTEROSCOPY;  Surgeon: Aloha Gell, MD;  Location: [facility];  Service: Gynecology;  Laterality: N/A;   IR 3D INDEPENDENT WKST  10/15/2022   IR 3D INDEPENDENT WKST  10/15/2022   IR ANGIOGRAM SELECTIVE EACH ADDITIONAL VESSEL  10/15/2022   IR ANGIOGRAM SELECTIVE EACH ADDITIONAL VESSEL  10/15/2022   IR ANGIOGRAM SELECTIVE EACH ADDITIONAL VESSEL  10/15/2022   IR ANGIOGRAM SELECTIVE EACH ADDITIONAL VESSEL  10/15/2022   IR ANGIOGRAM VISCERAL SELECTIVE  10/15/2022   IR EMBO TUMOR ORGAN ISCHEMIA INFARCT INC GUIDE ROADMAPPING  10/15/2022   IR GENERIC HISTORICAL  07/03/2016   IR RADIOLOGIST EVAL & MGMT 07/03/2016 [name], MD [facility]   IR GENERIC HISTORICAL  11/18/2016   IR RADIOLOGIST EVAL & MGMT 11/18/2016 [name], MD [facility]   IR RADIOLOGIST EVAL & MGMT  04/09/2017   IR RADIOLOGIST EVAL & MGMT  11/05/2017   IR RADIOLOGIST EVAL & MGMT  03/13/2020   IR RADIOLOGIST EVAL & MGMT  08/30/2020   IR RADIOLOGIST EVAL & MGMT  04/10/2021   IR RADIOLOGIST EVAL & MGMT  06/04/2022   IR RADIOLOGIST EVAL & MGMT  09/23/2022   IR US GUIDE VASC ACCESS RIGHT  10/15/2022   JOINT REPLACEMENT     BILATERAL KNEES   left shoulder arthroplasty     POLYPECTOMY  06/06/2020   Procedure: POLYPECTOMY;  Surgeon: Lavena Bullion, DO;  Location: [facility];  Service: Gastroenterology;;   POLYPECTOMY  07/11/2020   Procedure: POLYPECTOMY;  Surgeon: Lavena Bullion, DO;  Location[facility];  Service: Gastroenterology;;   RADIOLOGY WITH ANESTHESIA N/A 10/15/2022   Procedure: IR WITH ANESTHESIA MICROWAVE ABLATION OF LIVER;  Surgeon: Criselda Peaches, MD;  Location: [facility];  Service: Radiology;  Laterality: N/A;   right knee  replacement  2007   total knee replacement L  03-13-08   TUBAL LIGATION      Allergies: Amlodipine, Ace inhibitors, Benazepril hcl, and Codeine  Medications: Prior to Admission medications   Medication Sig Start Date End Date Taking? Authorizing Provider  aspirin EC 81 MG tablet Take 81 mg by mouth daily.    [provider]  BLACK COHOSH EXTRACT PO Take 1 tablet by mouth daily.    [provider]  Cholecalciferol 25 MCG (1000 UT) tablet Take 1,000 Units by mouth daily.     [provider]  furosemide (LASIX) 20 MG tablet Take 1-2 tablets (20-40 mg total) by mouth daily. Patient taking differently: Take 20 mg by mouth daily. 08/28/21   Plotnikov, Evie Lacks, MD  glucosamine-chondroitin 500-400 MG tablet Take 1 tablet by mouth daily.    [provider]  loratadine (EQ ALLERGY RELIEF) 10 MG tablet Take 1 tablet by mouth once daily 07/24/22   Plotnikov, Evie Lacks, MD  losartan (COZAAR) 100 MG tablet Take 1 tablet by mouth once daily 09/25/22  Plotnikov, Evie Lacks, MD  meloxicam (MOBIC) 7.5 MG tablet TAKE 1 TO 2 TABLETS BY MOUTH ONCE DAILY AS NEEDED FOR PAIN 10/27/22   Plotnikov, Evie Lacks, MD  Multiple Vitamin (MULTIVITAMIN WITH MINERALS) TABS tablet Take 1 tablet by mouth daily.     [provider]  pantoprazole (PROTONIX) 40 MG tablet Take 1 tablet (40 mg total) by mouth daily. 08/28/21   Plotnikov, Evie Lacks, MD  potassium chloride SA (KLOR-CON M) 20 MEQ tablet Take 1 tablet by mouth once daily 10/30/22   Plotnikov, Evie Lacks, MD  silver sulfADIAZINE (SILVADENE) 1 % cream Apply 1 application topically daily. 09/09/21   Jaynee Eagles, PA-C  trolamine salicylate (ASPERCREME) 10 % cream Apply 1 Application topically as needed for muscle pain.    [provider]  vitamin C (ASCORBIC ACID) 500 MG tablet Take 500 mg by mouth daily.    [provider]  promethazine (PHENERGAN) 12.5 MG tablet Take 1 tablet (12.5 mg total) by mouth every 6 (six)  hours as needed for nausea or vomiting. 08/09/19 09/27/20  Janith Lima, MD     Family History  Problem Relation Age of Onset   Hypertension Mother    Ovarian cancer Mother    Hypertension Father    Cancer Father 61       prostate cancer    Diabetes Father    Diabetes Other    Hypertension Other    Colon cancer Neg Hx    Esophageal cancer Neg Hx    Rectal cancer Neg Hx    Stomach cancer Neg Hx    Breast cancer Neg Hx     Social History   Socioeconomic History   Marital status: Single    Spouse name: Not on file   Number of children: 4   Years of education: Not on file   Highest education level: Not on file  Occupational History   Not on file  Tobacco Use   Smoking status: Former    Packs/day: 0.25    Years: 20.00    Total pack years: 5.00    Types: Cigarettes    Quit date: 12/30/2007    Years since quitting: 14.8   Smokeless tobacco: Never  Vaping Use   Vaping Use: Never used  Substance and Sexual Activity   Alcohol use: No    Comment: 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, quit drinking alcohol in 10/2015    Drug use: No   Sexual activity: Not Currently    Birth control/protection: Post-menopausal  Other Topics Concern   Not on file  Social History Narrative   Regular exercise: walk a few times a week   Caffeine use: none   Single, raising her young great granddaughter and watches grandchildren after school   Has a room mate to help prn   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 [place]   Independent/drives   Social Determinants of Health   Financial Resource Strain: Low Risk  (01/17/2022)   Overall Financial Resource Strain (CARDIA)    Difficulty of Paying Living Expenses: Not hard at all  Food Insecurity: No Food Insecurity (10/16/2022)   Hunger Vital Sign    Worried About Running Out of Food in the Last Year: Never true    Ran Out of Food in the Last Year: Never true  Transportation Needs: No Transportation Needs (10/16/2022)    PRAPARE - Hydrologist (Medical): No    Lack of Transportation (  Non-Medical): No  Physical Activity: Sufficiently Active (01/17/2022)   Exercise Vital Sign    Days of Exercise per Week: 5 days    Minutes of Exercise per Session: 30 min  Stress: No Stress Concern Present (01/17/2022)   [place]    Feeling of Stress : Not at all  Social Connections: Moderately Isolated (01/17/2022)   Social Connection and Isolation Panel [NHANES]    Frequency of Communication with Friends and Family: Three times a week    Frequency of Social Gatherings with Friends and Family: Three times a week    Attends Religious Services: More than 4 times per year    Active Member of Clubs or Organizations: No    Attends Archivist Meetings: Never    Marital Status: Never married    ECOG Status: 0 - Asymptomatic  Review of Systems  Review of Systems: A 12 point ROS discussed and pertinent positives are indicated in the HPI above.  All other systems are negative.   Physical Exam No direct physical exam was performed (except for noted visual exam findings with Video Visits).    Vital Signs: LMP  (LMP Unknown)     Labs:  CBC: Recent Labs    10/08/22 1430 10/15/22 0700 10/16/22 0438 10/27/22 1249  WBC 3.4* 3.7* 6.5 4.1  HGB 12.7 11.8* 11.3* 11.8  HCT 40.9 40.4 37.2 37.3  PLT 203 182 177 289    COAGS: Recent Labs    09/12/22 1116 10/08/22 1430 10/15/22 0700 10/27/22 1249  INR 1.0 1.0 1.0 1.0    BMP: Recent Labs    10/08/22 1430 10/15/22 0700 10/16/22 0438 10/27/22 1249  NA 142 141 141 142  K 3.8 5.5* 4.1 4.5  CL 110 112* 113* 108  CO2 25 24 24 22   GLUCOSE 95 81 148* 96  BUN 15 15 15 13   CALCIUM 9.2 8.8* 8.4* 9.2  CREATININE 0.94 1.05* 0.97 0.93  GFRNONAA >60 56* >60  --     LIVER FUNCTION TESTS: Recent Labs    04/16/22 1022 09/12/22 1116 10/08/22 1430 10/15/22 0700  10/16/22 0438 10/27/22 1249  BILITOT 0.4   < > 0.7 1.2 0.4 0.4  AST 21   < > 18 28 160* 17  ALT 11   < > 12 8 138* 29  ALKPHOS 92  --  78 86 66  --   PROT 7.8   < > 7.6 7.2 6.2* 7.1  ALBUMIN 4.4  --  4.0 3.7 3.2*  --    < > = values in this interval not displayed.    TUMOR MARKERS: Recent Labs    09/12/22 1116  AFPTM 10.1*    Assessment and Plan:  71 year old female doing extremely well 2 weeks status post combined DEB-TACE and percutaneous microwave ablation of hepatocellular carcinoma.  She has recovered fully from her procedure and is back at her usual state of health.  However recovery was quite easy and she has no complaints at this time.  We will continue routine surveillance for recurrent hepatocellular carcinoma in the setting of cirrhosis.  1.) MRI with gad and clinic visit in 3 months    Electronically Signed: Criselda Peaches 11/03/2022, 10:51 AM   I spent a total of  15 Minutes in remote  clinical consultation, greater than 50% of which was counseling/coordinating care for hepatocellular cancer.    Visit type: Audio only (telephone). Audio (no video) only due to patient preference. Alternative  for in-person consultation at [facility], [address], [place], [place]. This visit type was conducted due to national recommendations for restrictions regarding the COVID-19 Pandemic (e.g. social distancing).  This format is felt to be most appropriate for this patient at this time.  All issues noted in this document were discussed and addressed.

## 2022-11-19 ENCOUNTER — Other Ambulatory Visit: Payer: Self-pay | Admitting: Internal Medicine

## 2022-12-01 ENCOUNTER — Encounter: Payer: Self-pay | Admitting: Internal Medicine

## 2022-12-01 ENCOUNTER — Ambulatory Visit (INDEPENDENT_AMBULATORY_CARE_PROVIDER_SITE_OTHER): Payer: Medicare Other | Admitting: Internal Medicine

## 2022-12-01 VITALS — BP 124/78 | HR 84 | Temp 98.2°F | Ht 60.0 in | Wt 295.0 lb

## 2022-12-01 DIAGNOSIS — R350 Frequency of micturition: Secondary | ICD-10-CM | POA: Diagnosis not present

## 2022-12-01 DIAGNOSIS — I10 Essential (primary) hypertension: Secondary | ICD-10-CM

## 2022-12-01 DIAGNOSIS — Z0000 Encounter for general adult medical examination without abnormal findings: Secondary | ICD-10-CM

## 2022-12-01 DIAGNOSIS — E785 Hyperlipidemia, unspecified: Secondary | ICD-10-CM | POA: Diagnosis not present

## 2022-12-01 DIAGNOSIS — C220 Liver cell carcinoma: Secondary | ICD-10-CM

## 2022-12-01 DIAGNOSIS — R739 Hyperglycemia, unspecified: Secondary | ICD-10-CM

## 2022-12-01 DIAGNOSIS — Z0001 Encounter for general adult medical examination with abnormal findings: Secondary | ICD-10-CM

## 2022-12-01 MED ORDER — LOSARTAN POTASSIUM 100 MG PO TABS: 100.0000 mg | ORAL_TABLET | Freq: Every day | ORAL | 3 refills | Status: DC

## 2022-12-01 MED ORDER — MELOXICAM 7.5 MG PO TABS: ORAL_TABLET | ORAL | 0 refills | Status: DC

## 2022-12-01 MED ORDER — CEPHALEXIN 500 MG PO CAPS: 500.0000 mg | ORAL_CAPSULE | Freq: Four times a day (QID) | ORAL | 1 refills | Status: DC

## 2022-12-01 MED ORDER — PANTOPRAZOLE SODIUM 40 MG PO TBEC: 40.0000 mg | DELAYED_RELEASE_TABLET | Freq: Every day | ORAL | 3 refills | Status: DC

## 2022-12-01 MED ORDER — POTASSIUM CHLORIDE CRYS ER 20 MEQ PO TBCR: 20.0000 meq | EXTENDED_RELEASE_TABLET | Freq: Every day | ORAL | 3 refills | Status: DC

## 2022-12-01 NOTE — Patient Instructions (Signed)
tDAP vaccine

## 2022-12-01 NOTE — Assessment & Plan Note (Signed)
Chronic Cont on Losartan, Norvasc, Lasix

## 2022-12-01 NOTE — Progress Notes (Signed)
Subjective:  Patient ID: Samantha [name], female    DOB: Mar 01, 1951  Age: 71 y.o. MRN: [id number]  CC: Follow-up (Has a uti)   HPI Samantha [name] presents for a well exam;  f/u on  liver cancer, HTN. C/o UTI  Outpatient Medications Prior to Visit  Medication Sig Dispense Refill   aspirin EC 81 MG tablet Take 81 mg by mouth daily.     BLACK COHOSH EXTRACT PO Take 1 tablet by mouth daily.     Cholecalciferol 25 MCG (1000 UT) tablet Take 1,000 Units by mouth daily.      furosemide (LASIX) 20 MG tablet Take 1-2 tablets (20-40 mg total) by mouth daily. (Patient taking differently: Take 20 mg by mouth daily.) 180 tablet 3   glucosamine-chondroitin 500-400 MG tablet Take 1 tablet by mouth daily.     loratadine (EQ ALLERGY RELIEF) 10 MG tablet Take 1 tablet by mouth once daily 90 tablet 1   losartan (COZAAR) 100 MG tablet Take 1 tablet by mouth once daily 90 tablet 1   meloxicam (MOBIC) 7.5 MG tablet TAKE 1 TO 2 TABLETS BY MOUTH ONCE DAILY AS NEEDED FOR PAIN 60 tablet 0   Multiple Vitamin (MULTIVITAMIN WITH MINERALS) TABS tablet Take 1 tablet by mouth daily.      pantoprazole (PROTONIX) 40 MG tablet Take 1 tablet (40 mg total) by mouth daily. 90 tablet 3   potassium chloride SA (KLOR-CON M) 20 MEQ tablet Take 1 tablet by mouth once daily 90 tablet 1   silver sulfADIAZINE (SILVADENE) 1 % cream Apply 1 application topically daily. 100 g 0   trolamine salicylate (ASPERCREME) 10 % cream Apply 1 Application topically as needed for muscle pain.     vitamin C (ASCORBIC ACID) 500 MG tablet Take 500 mg by mouth daily.     No facility-administered medications prior to visit.    ROS: Review of Systems  Constitutional:  Negative for activity change, appetite change, chills, fatigue and unexpected weight change.  HENT:  Negative for congestion, mouth sores and sinus pressure.   Eyes:  Negative for visual disturbance.  Respiratory:  Negative for cough and chest tightness.    Gastrointestinal:  Negative for abdominal pain and nausea.  Genitourinary:  Negative for difficulty urinating, frequency and vaginal pain.  Musculoskeletal:  Negative for back pain and gait problem.  Skin:  Negative for pallor and rash.  Neurological:  Negative for dizziness, tremors, weakness, numbness and headaches.  Psychiatric/Behavioral:  Negative for confusion and sleep disturbance.     Objective:  BP 124/78 (BP Location: Left Arm, Patient Position: Sitting, Cuff Size: Normal)   Pulse 84   Temp 98.2 F (36.8 C) (Oral)   Ht 5' (1.524 m)   Wt 295 lb (133.8 kg)   LMP  (LMP Unknown)   SpO2 98%   BMI 57.61 kg/m   BP Readings from Last 3 Encounters:  12/01/22 124/78  10/16/22 (!) 149/78  10/08/22 (!) 158/92    Wt Readings from Last 3 Encounters:  12/01/22 295 lb (133.8 kg)  10/15/22 290 lb 12.8 oz (131.9 kg)  10/08/22 290 lb 12.8 oz (131.9 kg)    Physical Exam Constitutional:      General: She is not in acute distress.    Appearance: She is well-developed. She is obese.  HENT:     Head: Normocephalic.     Right Ear: External ear normal.     Left Ear: External ear normal.     Nose: Nose normal.  Eyes:     General:        Right eye: No discharge.        Left eye: No discharge.     Conjunctiva/sclera: Conjunctivae normal.     Pupils: Pupils are equal, round, and reactive to light.  Neck:     Thyroid: No thyromegaly.     Vascular: No JVD.     Trachea: No tracheal deviation.  Cardiovascular:     Rate and Rhythm: Normal rate and regular rhythm.     Heart sounds: Normal heart sounds.  Pulmonary:     Effort: No respiratory distress.     Breath sounds: No stridor. No wheezing.  Abdominal:     General: Bowel sounds are normal. There is no distension.     Palpations: Abdomen is soft. There is no mass.     Tenderness: There is no abdominal tenderness. There is no guarding or rebound.  Musculoskeletal:        General: No tenderness.     Cervical back: Normal range  of motion and neck supple. No rigidity.  Lymphadenopathy:     Cervical: No cervical adenopathy.  Skin:    Findings: No erythema or rash.  Neurological:     Mental Status: She is oriented to person, place, and time.     Cranial Nerves: No cranial nerve deficit.     Motor: No abnormal muscle tone.     Coordination: Coordination normal.     Deep Tendon Reflexes: Reflexes normal.  Psychiatric:        Behavior: Behavior normal.        Thought Content: Thought content normal.        Judgment: Judgment normal.     Lab Results  Component Value Date   WBC 4.1 10/27/2022   HGB 11.8 10/27/2022   HCT 37.3 10/27/2022   PLT 289 10/27/2022   GLUCOSE 96 10/27/2022   CHOL 156 05/22/2020   TRIG 93.0 05/22/2020   HDL 43.30 05/22/2020   LDLCALC 94 05/22/2020   ALT 29 10/27/2022   AST 17 10/27/2022   NA 142 10/27/2022   K 4.5 10/27/2022   CL 108 10/27/2022   CREATININE 0.93 10/27/2022   BUN 13 10/27/2022   CO2 22 10/27/2022   TSH 1.67 04/16/2022   INR 1.0 10/27/2022   HGBA1C 6.1 12/31/2021    IR Radiologist Eval & Mgmt  Result Date: 11/03/2022 Please refer to notes tab for details about interventional procedure. (Op Note)   Assessment & Plan:   Problem List Items Addressed This Visit     Essential hypertension    Chronic Cont on Losartan, Norvasc, Lasix      Relevant Orders   TSH   Comprehensive metabolic panel   Hepatocellular carcinoma ([place])    S/p recent ablation 09/2022      Relevant Medications   cephALEXin (KEFLEX) 500 MG capsule   Other Relevant Orders   CBC with Differential/Platelet   Hyperglycemia    Check A1c      Relevant Orders   TSH   Comprehensive metabolic panel   Hyperlipidemia   Relevant Orders   TSH   Lipid panel   Urinary frequency    UA Keflex po      Relevant Orders   Urinalysis   Well adult exam - Primary    Here for medicare wellness/physical   We discussed age appropriate health related issues, including available/recomended  screening tests and vaccinations. Labs were ordered to be later reviewed . All  questions were answered. We discussed one or more of the following - seat belt use, use of sunscreen/sun exposure exercise, fall risk reduction, second hand smoke exposure, firearm use and storage, seat belt use, a need for adhering to healthy diet and exercise. Labs were ordered.  All questions were answered. tDAP suggested      Relevant Orders   TSH   Urinalysis   CBC with Differential/Platelet   Lipid panel   Comprehensive metabolic panel      Meds ordered this encounter  Medications   cephALEXin (KEFLEX) 500 MG capsule    Sig: Take 1 capsule (500 mg total) by mouth 4 (four) times daily.    Dispense:  12 capsule    Refill:  1      Follow-up: Return in about 6 months (around 06/02/2023) for a follow-up visit.  Walker Kehr, MD

## 2022-12-01 NOTE — Assessment & Plan Note (Signed)
S/p recent ablation 09/2022

## 2022-12-01 NOTE — Assessment & Plan Note (Signed)
Check A1c. 

## 2022-12-01 NOTE — Assessment & Plan Note (Signed)
Here for medicare wellness/physical   We discussed age appropriate health related issues, including available/recomended screening tests and vaccinations. Labs were ordered to be later reviewed . All questions were answered. We discussed one or more of the following - seat belt use, use of sunscreen/sun exposure exercise, fall risk reduction, second hand smoke exposure, firearm use and storage, seat belt use, a need for adhering to healthy diet and exercise. Labs were ordered.  All questions were answered. tDAP suggested

## 2022-12-01 NOTE — Assessment & Plan Note (Signed)
UA Keflex po

## 2023-01-08 ENCOUNTER — Telehealth: Payer: Self-pay | Admitting: Internal Medicine

## 2023-01-08 NOTE — Telephone Encounter (Signed)
Left message for patient to call back to schedule Medicare Annual Wellness Visit   Last AWV  01/17/22  Please schedule at anytime with [facility] if patient calls the office back.    30 Minutes appointment   Any questions, please call me at [phone]

## 2023-01-15 ENCOUNTER — Other Ambulatory Visit: Payer: Self-pay | Admitting: Internal Medicine

## 2023-01-22 DIAGNOSIS — K7469 Other cirrhosis of liver: Secondary | ICD-10-CM | POA: Diagnosis not present

## 2023-01-22 DIAGNOSIS — C220 Liver cell carcinoma: Secondary | ICD-10-CM | POA: Diagnosis not present

## 2023-01-23 ENCOUNTER — Other Ambulatory Visit: Payer: Self-pay | Admitting: Nurse Practitioner

## 2023-01-23 DIAGNOSIS — C220 Liver cell carcinoma: Secondary | ICD-10-CM

## 2023-01-23 DIAGNOSIS — K7469 Other cirrhosis of liver: Secondary | ICD-10-CM

## 2023-02-03 ENCOUNTER — Ambulatory Visit (INDEPENDENT_AMBULATORY_CARE_PROVIDER_SITE_OTHER): Payer: Medicare Other

## 2023-02-03 VITALS — BP 122/70 | HR 93 | Temp 97.3°F | Ht 60.0 in | Wt 294.6 lb

## 2023-02-03 DIAGNOSIS — Z0000 Encounter for general adult medical examination without abnormal findings: Secondary | ICD-10-CM

## 2023-02-03 NOTE — Patient Instructions (Signed)
[name] , Thank you for taking time to come for your Medicare Wellness Visit. I appreciate your ongoing commitment to your health goals. Please review the following plan we discussed and let me know if I can assist you in the future.   These are the goals we discussed:  Goals      Travel as much as possible, enjoy family and love life        This is a list of the screening recommended for you and due dates:  Health Maintenance  Topic Date Due   Zoster (Shingles) Vaccine (1 of 2) Never done   COVID-19 Vaccine (3 - Pfizer risk series) 02/03/2021   DTaP/Tdap/Td vaccine (2 - Td or Tdap) 11/30/2022   Medicare Annual Wellness Visit  01/17/2023   Mammogram  07/19/2023   Colon Cancer Screening  07/11/2025   Pneumonia Vaccine  Completed   Flu Shot  Completed   DEXA scan (bone density measurement)  Completed   Hepatitis C Screening: USPSTF Recommendation to screen - Ages 53-79 yo.  Completed   HPV Vaccine  Aged Out    Advanced directives: Yes  Conditions/risks identified: Yes  Next appointment: Follow up in one year for your annual wellness visit.   Preventive Care 27 Years and Older, Female Preventive care refers to lifestyle choices and visits with your health care provider that can promote health and wellness. What does preventive care include? A yearly physical exam. This is also called an annual well check. Dental exams once or twice a year. Routine eye exams. Ask your health care provider how often you should have your eyes checked. Personal lifestyle choices, including: Daily care of your teeth and gums. Regular physical activity. Eating a healthy diet. Avoiding tobacco and drug use. Limiting alcohol use. Practicing safe sex. Taking low-dose aspirin every day. Taking vitamin and mineral supplements as recommended by your health care provider. What happens during an annual well check? The services and screenings done by your health care provider during your annual  well check will depend on your age, overall health, lifestyle risk factors, and family history of disease. Counseling  Your health care provider may ask you questions about your: Alcohol use. Tobacco use. Drug use. Emotional well-being. Home and relationship well-being. Sexual activity. Eating habits. History of falls. Memory and ability to understand (cognition). Work and work Statistician. Reproductive health. Screening  You may have the following tests or measurements: Height, weight, and BMI. Blood pressure. Lipid and cholesterol levels. These may be checked every 5 years, or more frequently if you are over 42 years old. Skin check. Lung cancer screening. You may have this screening every year starting at age 68 if you have a 30-pack-year history of smoking and currently smoke or have quit within the past 15 years. Fecal occult blood test (FOBT) of the stool. You may have this test every year starting at age 53. Flexible sigmoidoscopy or colonoscopy. You may have a sigmoidoscopy every 5 years or a colonoscopy every 10 years starting at age 47. Hepatitis C blood test. Hepatitis B blood test. Sexually transmitted disease (STD) testing. Diabetes screening. This is done by checking your blood sugar (glucose) after you have not eaten for a while (fasting). You may have this done every 1-3 years. Bone density scan. This is done to screen for osteoporosis. You may have this done starting at age 107. Mammogram. This may be done every 1-2 years. Talk to your health care provider about how often you should have regular  mammograms. Talk with your health care provider about your test results, treatment options, and if necessary, the need for more tests. Vaccines  Your health care provider may recommend certain vaccines, such as: Influenza vaccine. This is recommended every year. Tetanus, diphtheria, and acellular pertussis (Tdap, Td) vaccine. You may need a Td booster every 10 years. Zoster  vaccine. You may need this after age 32. Pneumococcal 13-valent conjugate (PCV13) vaccine. One dose is recommended after age 60. Pneumococcal polysaccharide (PPSV23) vaccine. One dose is recommended after age 70. Talk to your health care provider about which screenings and vaccines you need and how often you need them. This information is not intended to replace advice given to you by your health care provider. Make sure you discuss any questions you have with your health care provider. Document Released: 01/11/2016 Document Revised: 09/03/2016 Document Reviewed: 10/16/2015 Elsevier Interactive Patient Education  2017 [employer] Prevention in the Home Falls can cause injuries. They can happen to people of all ages. There are many things you can do to make your home safe and to help prevent falls. What can I do on the outside of my home? Regularly fix the edges of walkways and driveways and fix any cracks. Remove anything that might make you trip as you walk through a door, such as a raised step or threshold. Trim any bushes or trees on the path to your home. Use bright outdoor lighting. Clear any walking paths of anything that might make someone trip, such as rocks or tools. Regularly check to see if handrails are loose or broken. Make sure that both sides of any steps have handrails. Any raised decks and porches should have guardrails on the edges. Have any leaves, snow, or ice cleared regularly. Use sand or salt on walking paths during winter. Clean up any spills in your garage right away. This includes oil or grease spills. What can I do in the bathroom? Use night lights. Install grab bars by the toilet and in the tub and shower. Do not use towel bars as grab bars. Use non-skid mats or decals in the tub or shower. If you need to sit down in the shower, use a plastic, non-slip stool. Keep the floor dry. Clean up any water that spills on the floor as soon as it happens. Remove  soap buildup in the tub or shower regularly. Attach bath mats securely with double-sided non-slip rug tape. Do not have throw rugs and other things on the floor that can make you trip. What can I do in the bedroom? Use night lights. Make sure that you have a light by your bed that is easy to reach. Do not use any sheets or blankets that are too big for your bed. They should not hang down onto the floor. Have a firm chair that has side arms. You can use this for support while you get dressed. Do not have throw rugs and other things on the floor that can make you trip. What can I do in the kitchen? Clean up any spills right away. Avoid walking on wet floors. Keep items that you use a lot in easy-to-reach places. If you need to reach something above you, use a strong step stool that has a grab bar. Keep electrical cords out of the way. Do not use floor polish or wax that makes floors slippery. If you must use wax, use non-skid floor wax. Do not have throw rugs and other things on the floor that can  make you trip. What can I do with my stairs? Do not leave any items on the stairs. Make sure that there are handrails on both sides of the stairs and use them. Fix handrails that are broken or loose. Make sure that handrails are as long as the stairways. Check any carpeting to make sure that it is firmly attached to the stairs. Fix any carpet that is loose or worn. Avoid having throw rugs at the top or bottom of the stairs. If you do have throw rugs, attach them to the floor with carpet tape. Make sure that you have a light switch at the top of the stairs and the bottom of the stairs. If you do not have them, ask someone to add them for you. What else can I do to help prevent falls? Wear shoes that: Do not have high heels. Have rubber bottoms. Are comfortable and fit you well. Are closed at the toe. Do not wear sandals. If you use a stepladder: Make sure that it is fully opened. Do not climb a  closed stepladder. Make sure that both sides of the stepladder are locked into place. Ask someone to hold it for you, if possible. Clearly mark and make sure that you can see: Any grab bars or handrails. First and last steps. Where the edge of each step is. Use tools that help you move around (mobility aids) if they are needed. These include: Canes. Walkers. Scooters. Crutches. Turn on the lights when you go into a dark area. Replace any light bulbs as soon as they burn out. Set up your furniture so you have a clear path. Avoid moving your furniture around. If any of your floors are uneven, fix them. If there are any pets around you, be aware of where they are. Review your medicines with your doctor. Some medicines can make you feel dizzy. This can increase your chance of falling. Ask your doctor what other things that you can do to help prevent falls. This information is not intended to replace advice given to you by your health care provider. Make sure you discuss any questions you have with your health care provider. Document Released: 10/11/2009 Document Revised: 05/22/2016 Document Reviewed: 01/19/2015 Elsevier Interactive Patient Education  2017 [employer].

## 2023-02-03 NOTE — Progress Notes (Cosign Needed Addendum)
Subjective:   Samantha [name] is a 72 y.o. female who presents for Medicare Annual (Subsequent) preventive examination.  Review of Systems     Cardiac Risk Factors include: advanced age (>86mn, >>55women);dyslipidemia;family history of premature cardiovascular disease;hypertension;obesity (BMI >30kg/m2)     Objective:    Today's Vitals   02/03/23 0943 02/03/23 0945  BP: 122/70   Pulse: 93   Temp: (!) 97.3 F (36.3 C)   SpO2: 98%   Weight: 294 lb 9.6 oz (133.6 kg)   Height: 5' (1.524 m)   PainSc: 0-No pain 0-No pain   Body mass index is 57.54 kg/m.     02/03/2023    9:54 AM 10/15/2022    6:54 AM 10/08/2022    2:07 PM 01/17/2022    9:54 AM 04/20/2021    7:03 PM 11/27/2020    9:02 AM 07/11/2020    8:25 AM  Advanced Directives  Does Patient Have a Medical Advance Directive? Yes Yes Yes Yes No Yes Yes  Type of AParamedicof AGrimesLiving will HStidhamLiving will HMcVilleLiving will HCorderLiving will  HMorristownLiving will Living will  Does patient want to make changes to medical advance directive?  No - Patient declined    No - Patient declined   Copy of HPerryin Chart? No - copy requested No - copy requested No - copy requested No - copy requested  No - copy requested     Current Medications (verified) Outpatient Encounter Medications as of 02/03/2023  Medication Sig   aspirin EC 81 MG tablet Take 81 mg by mouth daily.   BLACK COHOSH EXTRACT PO Take 1 tablet by mouth daily.   cephALEXin (KEFLEX) 500 MG capsule Take 1 capsule (500 mg total) by mouth 4 (four) times daily.   Cholecalciferol 25 MCG (1000 UT) tablet Take 1,000 Units by mouth daily.    furosemide (LASIX) 20 MG tablet Take 1-2 tablets (20-40 mg total) by mouth daily. (Patient taking differently: Take 20 mg by mouth daily.)   glucosamine-chondroitin 500-400 MG tablet Take 1 tablet  by mouth daily.   loratadine (CLARITIN) 10 MG tablet Take 1 tablet by mouth once daily   losartan (COZAAR) 100 MG tablet Take 1 tablet (100 mg total) by mouth daily.   meloxicam (MOBIC) 7.5 MG tablet TAKE 1 TO 2 TABLETS BY MOUTH ONCE DAILY AS NEEDED FOR PAIN   Multiple Vitamin (MULTIVITAMIN WITH MINERALS) TABS tablet Take 1 tablet by mouth daily.    pantoprazole (PROTONIX) 40 MG tablet Take 1 tablet (40 mg total) by mouth daily.   potassium chloride SA (KLOR-CON M) 20 MEQ tablet Take 1 tablet (20 mEq total) by mouth daily.   silver sulfADIAZINE (SILVADENE) 1 % cream Apply 1 application topically daily.   trolamine salicylate (ASPERCREME) 10 % cream Apply 1 Application topically as needed for muscle pain.   vitamin C (ASCORBIC ACID) 500 MG tablet Take 500 mg by mouth daily.   [DISCONTINUED] promethazine (PHENERGAN) 12.5 MG tablet Take 1 tablet (12.5 mg total) by mouth every 6 (six) hours as needed for nausea or vomiting.   No facility-administered encounter medications on file as of 02/03/2023.    Allergies (verified) Amlodipine, Ace inhibitors, Benazepril hcl, and Codeine   History: Past Medical History:  Diagnosis Date   Allergy    seasonal   Anemia    Cataract    bilateral - MD just watching  Cirrhosis ([place]) 2018   Diverticulosis    Edema    Elevated glucose    no longer an issue 03-18-16   Fatty liver 2017   GERD (gastroesophageal reflux disease)    H/O seasonal allergies    Hepatitis C    hepatitis c completed  tx march 2017   Hyperlipidemia 2011   Hypertension    LBP (low back pain)    Liver cancer ([place]) 2017   IN REMISSION. WHEN IDAGNONED SIZE OF GRAPEFRUIT    Morbid obesity ([place])    Osteoarthritis    B knees   Right middle lobe pulmonary nodule    resolved 2018   Uterine fibroid    Wears glasses    Past Surgical History:  Procedure Laterality Date   BIOPSY  07/04/2019   Procedure: BIOPSY;  Surgeon: Jerene Bears, MD;  Location: [facility];  Service:  Gastroenterology;;   BIOPSY  07/11/2020   Procedure: BIOPSY;  Surgeon: Lavena Bullion, DO;  Location: [facility];  Service: Gastroenterology;;   CHOLECYSTECTOMY     COLONOSCOPY N/A 01/09/2015   Procedure: COLONOSCOPY;  Surgeon: Jerene Bears, MD;  [facility]: [facility];  Service: Gastroenterology;  Laterality: N/A;   COLONOSCOPY WITH PROPOFOL N/A 06/06/2020   Procedure: COLONOSCOPY WITH PROPOFOL;  Surgeon: Lavena Bullion, DO;  [facility]: [facility];  Service: Gastroenterology;  Laterality: N/A;   COLONOSCOPY WITH PROPOFOL N/A 07/11/2020   Procedure: COLONOSCOPY WITH PROPOFOL;  Surgeon: Lavena Bullion, DO;  [facility];  Service: Gastroenterology;  Laterality: N/A;   ESOPHAGOGASTRODUODENOSCOPY (EGD) WITH PROPOFOL N/A 06/10/2016   Procedure: ESOPHAGOGASTRODUODENOSCOPY (EGD) WITH PROPOFOL;  Surgeon: Jerene Bears, MD;  [facility];  Service: Gastroenterology;  Laterality: N/A;   ESOPHAGOGASTRODUODENOSCOPY (EGD) WITH PROPOFOL N/A 07/04/2019   Procedure: ESOPHAGOGASTRODUODENOSCOPY (EGD) WITH PROPOFOL;  Surgeon: Jerene Bears, MD;  [facility];  Service: Gastroenterology;  Laterality: N/A;   HYSTEROSCOPY WITH D & C N/A 10/15/2018   Procedure: DILATATION AND CURETTAGE /HYSTEROSCOPY;  Surgeon: Aloha Gell, MD;  Location: [facility];  Service: Gynecology;  Laterality: N/A;   IR 3D INDEPENDENT WKST  10/15/2022   IR 3D INDEPENDENT WKST  10/15/2022   IR ANGIOGRAM SELECTIVE EACH ADDITIONAL VESSEL  10/15/2022   IR ANGIOGRAM SELECTIVE EACH ADDITIONAL VESSEL  10/15/2022   IR ANGIOGRAM SELECTIVE EACH ADDITIONAL VESSEL  10/15/2022   IR ANGIOGRAM SELECTIVE EACH ADDITIONAL VESSEL  10/15/2022   IR ANGIOGRAM VISCERAL SELECTIVE  10/15/2022   IR EMBO TUMOR ORGAN ISCHEMIA INFARCT INC GUIDE ROADMAPPING  10/15/2022   IR GENERIC HISTORICAL  07/03/2016   IR RADIOLOGIST EVAL & MGMT 07/03/2016 [name], MD [facility]   IR GENERIC HISTORICAL  11/18/2016   IR RADIOLOGIST  EVAL & MGMT 11/18/2016 [name], MD [facility]   IR RADIOLOGIST EVAL & MGMT  04/09/2017   IR RADIOLOGIST EVAL & MGMT  11/05/2017   IR RADIOLOGIST EVAL & MGMT  03/13/2020   IR RADIOLOGIST EVAL & MGMT  08/30/2020   IR RADIOLOGIST EVAL & MGMT  04/10/2021   IR RADIOLOGIST EVAL & MGMT  06/04/2022   IR RADIOLOGIST EVAL & MGMT  09/23/2022   IR RADIOLOGIST EVAL & MGMT  11/03/2022   IR US GUIDE VASC ACCESS RIGHT  10/15/2022   JOINT REPLACEMENT     BILATERAL KNEES   left shoulder arthroplasty     POLYPECTOMY  06/06/2020   Procedure: POLYPECTOMY;  Surgeon: Lavena Bullion, DO;  [facility];  Service: Gastroenterology;;  POLYPECTOMY  07/11/2020   Procedure: POLYPECTOMY;  Surgeon: Lavena Bullion, DO;  [facility];  Service: Gastroenterology;;   RADIOLOGY WITH ANESTHESIA N/A 10/15/2022   Procedure: IR WITH ANESTHESIA MICROWAVE ABLATION OF LIVER;  Surgeon: Criselda Peaches, MD;  Location: [facility];  Service: Radiology;  Laterality: N/A;   right knee replacement  2007   total knee replacement L  03-13-08   TUBAL LIGATION     Family History  Problem Relation Age of Onset   Hypertension Mother    Ovarian cancer Mother    Hypertension Father    Cancer Father 60       prostate cancer    Diabetes Father    Diabetes Other    Hypertension Other    Colon cancer Neg Hx    Esophageal cancer Neg Hx    Rectal cancer Neg Hx    Stomach cancer Neg Hx    Breast cancer Neg Hx    Social History   Socioeconomic History   Marital status: Single    Spouse name: Not on file   Number of children: 4   Years of education: Not on file   Highest education level: Not on file  Occupational History   Not on file  Tobacco Use   Smoking status: Former    Packs/day: 0.25    Years: 20.00    Total pack years: 5.00    Types: Cigarettes    Quit date: 12/30/2007    Years since quitting: 15.1   Smokeless tobacco: Never  Vaping Use   Vaping Use: Never used  Substance and Sexual  Activity   Alcohol use: No    Comment: 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, quit drinking alcohol in 10/2015    Drug use: No   Sexual activity: Not Currently    Birth control/protection: Post-menopausal  Other Topics Concern   Not on file  Social History Narrative   Regular exercise: walk a few times a week   Caffeine use: none   Single, raising her young great granddaughter and watches grandchildren after school   Has a room mate to help prn   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 [employer]   Independent/drives   Social Determinants of Health   Financial Resource Strain: Low Risk  (02/03/2023)   Overall Financial Resource Strain (CARDIA)    Difficulty of Paying Living Expenses: Not hard at all  Food Insecurity: No Food Insecurity (02/03/2023)   Hunger Vital Sign    Worried About Running Out of Food in the Last Year: Never true    Ran Out of Food in the Last Year: Never true  Transportation Needs: No Transportation Needs (02/03/2023)   PRAPARE - Hydrologist (Medical): No    Lack of Transportation (Non-Medical): No  Physical Activity: Sufficiently Active (02/03/2023)   Exercise Vital Sign    Days of Exercise per Week: 5 days    Minutes of Exercise per Session: 30 min  Stress: No Stress Concern Present (02/03/2023)   [place]    Feeling of Stress : Not at all  Social Connections: Moderately Isolated (02/03/2023)   Social Connection and Isolation Panel [NHANES]    Frequency of Communication with Friends and Family: Three times a week    Frequency of Social Gatherings with Friends and Family: Three times a week    Attends Religious Services: More than 4 times per year  Active Member of Clubs or Organizations: No    Attends Archivist Meetings: Never    Marital Status: Never married    Tobacco Counseling Counseling given: Not Answered   Clinical  Intake:  Pre-visit preparation completed: Yes  Pain : No/denies pain Pain Score: 0-No pain     Nutritional Risks: None Diabetes: No  How often do you need to have someone help you when you read instructions, pamphlets, or other written materials from your doctor or pharmacy?: 1 - Never What is the last grade level you completed in school?: HSG  Diabetic? No  Interpreter Needed?: No  Information entered by :: Lisette Abu, LPN.   Activities of Daily Living    02/03/2023    9:47 AM 10/15/2022    5:13 PM  In your present state of health, do you have any difficulty performing the following activities:  Hearing? 0 0  Vision? 0 0  Difficulty concentrating or making decisions? 0 0  Walking or climbing stairs? 0 1  Dressing or bathing? 0 0  Doing errands, shopping? 0 0  Preparing Food and eating ? N   Using the Toilet? N   In the past six months, have you accidently leaked urine? N   Do you have problems with loss of bowel control? N   Managing your Medications? N   Managing your Finances? N   Housekeeping or managing your Housekeeping? N     Patient Care Team: Plotnikov, Evie Lacks, MD as PCP - General Comer, Okey Regal, MD as Consulting Physician (Infectious Diseases) Stark Klein, MD as Consulting Physician (General Surgery) Pyrtle, Lajuan Lines, MD as Consulting Physician (Gastroenterology) Truitt Merle, MD as Consulting Physician (Hematology) Aloha Gell, MD as Consulting Physician (Obstetrics and Gynecology) Jola Schmidt, MD as Consulting Physician (Ophthalmology) Criselda Peaches, MD as Consulting Physician (Interventional Radiology)  Indicate any recent Medical Services you may have received from other than [facility] providers in the past year (date may be approximate).     Assessment:   This is a routine wellness examination for Samantha [name].  Hearing/Vision screen Hearing Screening - Comments:: Denies hearing difficulties   Vision Screening - Comments:: Wears rx  glasses - up to date with routine eye exams with Jola Schmidt, MD.   Dietary issues and exercise activities discussed: Current Exercise Habits: Home exercise routine, Type of exercise: walking, Time (Minutes): 30, Frequency (Times/Week): 5, Weekly Exercise (Minutes/Week): 150, Intensity: Moderate, Exercise limited by: orthopedic condition(s)   Goals Addressed             This Visit's Progress    My goal for 2024 is to lose 60 pounds.        Depression Screen    02/03/2023    9:46 AM 12/01/2022    8:21 AM 05/28/2022    8:42 AM 01/17/2022    9:55 AM 01/17/2022    9:53 AM 05/17/2021    3:20 PM 11/27/2020    9:00 AM  PHQ 2/9 Scores  PHQ - 2 Score 0 0 0 0 0 0 0    Fall Risk    02/03/2023    9:55 AM 12/01/2022    8:21 AM 05/28/2022    8:42 AM 01/17/2022    9:55 AM 05/17/2021    3:20 PM  Fall Risk   Falls in the past year? 0 0 0 0 0  Number falls in past yr: 0 0 0 0 0  Injury with Fall? 0 0 0 0 0  Risk for fall due  to : No Fall Risks No Fall Risks No Fall Risks    Follow up Falls prevention discussed Falls evaluation completed Falls evaluation completed Falls evaluation completed Falls evaluation completed    [place]:  Any stairs in or around the home? No  If so, are there any without handrails? No  Home free of loose throw rugs in walkways, pet beds, electrical cords, etc? Yes  Adequate lighting in your home to reduce risk of falls? Yes   ASSISTIVE DEVICES UTILIZED TO PREVENT FALLS:  Life alert? No  Use of a cane, walker or w/c? No  Grab bars in the bathroom? No  Shower chair or bench in shower? Yes  Elevated toilet seat or a handicapped toilet? Yes   TIMED UP AND GO:  Was the test performed? Yes .  Length of time to ambulate 10 feet: 8 sec.   Gait steady and fast without use of assistive device  Cognitive Function:    11/11/2017   11:12 AM  MMSE - Mini Mental State Exam  Orientation to time 5  Orientation to Place 5   Registration 3  Attention/ Calculation 5  Recall 2  Language- name 2 objects 2  Language- repeat 1  Language- follow 3 step command 3  Language- read & follow direction 1  Write a sentence 1  Copy design 1  Total score 29        02/03/2023    9:47 AM 11/27/2020    9:04 AM  6CIT Screen  What Year? 0 points 0 points  What month? 0 points 0 points  What time? 0 points 0 points  Count back from 20 0 points 0 points  Months in reverse 0 points 0 points  Repeat phrase 0 points 0 points  Total Score 0 points 0 points    Immunizations Immunization History  Administered Date(s) Administered   Fluad Quad(high Dose 65+) 08/23/2019, 09/19/2020, 08/28/2021, 09/12/2022   Influenza Split 11/25/2011   Influenza Whole 09/13/2008, 08/31/2010   Influenza, High Dose Seasonal PF 09/17/2016, 09/08/2017, 09/23/2018   Influenza, Seasonal, Injecte, Preservative Fre 11/30/2012   Influenza,inj,Quad PF,6+ Mos 09/06/2013, 09/05/2014, 09/07/2015   Moderna SARS-COV2 Booster Vaccination 01/06/2021   PFIZER(Purple Top)SARS-COV-2 Vaccination 03/07/2020, 04/05/2020   PPD Test 07/12/2018, 07/13/2019, 07/16/2020   Pneumococcal Conjugate-13 12/08/2014   Pneumococcal Polysaccharide-23 03/23/2012, 11/11/2017   Tdap 11/30/2012, 12/04/2022    TDAP status: Up to date  Flu Vaccine status: Up to date  Pneumococcal vaccine status: Up to date  Covid-19 vaccine status: Completed vaccines  Qualifies for Shingles Vaccine? Yes   Zostavax completed No   Shingrix Completed?: No.    Education has been provided regarding the importance of this vaccine. Patient has been advised to call insurance company to determine out of pocket expense if they have not yet received this vaccine. Advised may also receive vaccine at local pharmacy or Health Dept. Verbalized acceptance and understanding.  Screening Tests Health Maintenance  Topic Date Due   Zoster Vaccines- Shingrix (1 of 2) Never done   COVID-19 Vaccine (3 -  Pfizer risk series) 02/03/2021   MAMMOGRAM  07/19/2023   Medicare Annual Wellness (AWV)  02/04/2024   COLONOSCOPY (Pts 45-28yr Insurance coverage will need to be confirmed)  07/11/2025   DTaP/Tdap/Td (3 - Td or Tdap) 12/04/2032   Pneumonia Vaccine 72 Years old  Completed   INFLUENZA VACCINE  Completed   DEXA SCAN  Completed   Hepatitis C Screening  Completed   HPV  [place] Maintenance  Health Maintenance Due  Topic Date Due   Zoster Vaccines- Shingrix (1 of 2) Never done   COVID-19 Vaccine (3 - Pfizer risk series) 02/03/2021    Colorectal cancer screening: Type of screening: Colonoscopy. Completed 07/11/2020. Repeat every 5 years  Mammogram status: Completed 07/18/2022. Repeat every year  Bone Density status: Completed 06/20/2022. Results reflect: Bone density results: NORMAL. Repeat every 5 years.  Lung Cancer Screening: (Low Dose CT Chest recommended if Age 8-80 years, 30 pack-year currently smoking OR have quit w/in 15years.) does not qualify.   Lung Cancer Screening Referral: no  Additional Screening:  Hepatitis C Screening: does qualify; Completed 05/17/2021  Vision Screening: Recommended annual ophthalmology exams for early detection of glaucoma and other disorders of the eye. Is the patient up to date with their annual eye exam?  Yes  Who is the provider or what is the name of the office in which the patient attends annual eye exams? Jola Schmidt, MD. If pt is not established with a provider, would they like to be referred to a provider to establish care? No .   Dental Screening: Recommended annual dental exams for proper oral hygiene  Community Resource Referral / Chronic Care Management: CRR required this visit?  No   CCM required this visit?  No      Plan:     I have personally reviewed and noted the following in the patient's chart:   Medical and social history Use of alcohol, tobacco or illicit drugs  Current medications and  supplements including opioid prescriptions. Patient is not currently taking opioid prescriptions. Functional ability and status Nutritional status Physical activity Advanced directives List of other physicians Hospitalizations, surgeries, and ER visits in previous 12 months Vitals Screenings to include cognitive, depression, and falls Referrals and appointments  In addition, I have reviewed and discussed with patient certain preventive protocols, quality metrics, and best practice recommendations. A written personalized care plan for preventive services as well as general preventive health recommendations were provided to patient.     Sheral Flow, LPN   [id number]   Nurse Notes:  Normal cognitive status assessed by direct observation by this Nurse Health Advisor. No abnormalities found.    Medical screening examination/treatment/procedure(s) were performed by non-physician practitioner and as supervising physician I was immediately available for consultation/collaboration.  I agree with above. Lew Dawes, MD

## 2023-02-24 ENCOUNTER — Ambulatory Visit
Admission: RE | Admit: 2023-02-24 | Discharge: 2023-02-24 | Disposition: A | Discharge status: Home / Self Care | Payer: Medicare Other | Source: Ambulatory Visit | Attending: Nurse Practitioner | Admitting: Nurse Practitioner

## 2023-02-24 DIAGNOSIS — K7469 Other cirrhosis of liver: Secondary | ICD-10-CM

## 2023-02-24 DIAGNOSIS — C220 Liver cell carcinoma: Secondary | ICD-10-CM

## 2023-02-24 DIAGNOSIS — K7689 Other specified diseases of liver: Secondary | ICD-10-CM | POA: Diagnosis not present

## 2023-02-24 MED ORDER — GADOPICLENOL 0.5 MMOL/ML IV SOLN
10.0000 mL | Freq: Once | INTRAVENOUS | Status: AC | PRN
  Administered 2023-02-24: 10 mL via INTRAVENOUS

## 2023-02-26 ENCOUNTER — Other Ambulatory Visit: Payer: Self-pay | Admitting: Interventional Radiology

## 2023-02-26 DIAGNOSIS — C220 Liver cell carcinoma: Secondary | ICD-10-CM

## 2023-03-02 ENCOUNTER — Ambulatory Visit
Admission: RE | Admit: 2023-03-02 | Discharge: 2023-03-02 | Disposition: A | Discharge status: Home / Self Care | Payer: Medicare Other | Source: Ambulatory Visit | Attending: Interventional Radiology | Admitting: Interventional Radiology

## 2023-03-02 DIAGNOSIS — C787 Secondary malignant neoplasm of liver and intrahepatic bile duct: Secondary | ICD-10-CM | POA: Diagnosis not present

## 2023-03-02 DIAGNOSIS — K7581 Nonalcoholic steatohepatitis (NASH): Secondary | ICD-10-CM | POA: Diagnosis not present

## 2023-03-02 DIAGNOSIS — C220 Liver cell carcinoma: Secondary | ICD-10-CM

## 2023-03-02 DIAGNOSIS — C801 Malignant (primary) neoplasm, unspecified: Secondary | ICD-10-CM | POA: Diagnosis not present

## 2023-03-02 HISTORY — PX: IR RADIOLOGIST EVAL & MGMT: IMG5224

## 2023-03-02 NOTE — Progress Notes (Signed)
Chief Complaint: Patient was consulted remotely today ([place]) for HCV/NASH cirrhosis complicated by [facility] x2 at the request of Dawnyel Leven K.    Referring Physician(s): Ashni Lonzo K  History of Present Illness: Samantha [name] is a 72 y.o. 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   She underwent conventional lipiodol transarterial chemo embolization on 2/23 and then subsequent CT guided thermal ablation of this lesion on 03/21/2016.   She presents today for continued follow-up evaluation.     MRI 08/29/21:  Stable ablation defect in anterior right hepatic lobe. No evidence of recurrent carcinoma.   4 mm focus of arterial phase hyperenhancement in the lateral dome of the right hepatic lobe appears slightly more conspicuous than on prior exam, but is difficult to characterize due to its small size. Recommend continued follow-up by abdomen MRI without and with contrast in 6 months.   MRI 11/1821: 1. Similar appearance of ablation defect within segment 6/8, without local recurrence. 2. Suspect mild cirrhosis. 3. Similar size of a segment 7 observation which demonstrates arterial and persistent contrast enhancement. LR 3.   MRI 06/03/22 1. Stable segment 8/5 ablation defect. No findings suspicious for recurrent tumor. 2. Interval enlargement of the segment 7 lesion now measuring 11 mm.  Interval growth suggests a LI-RADS 4 lesion. Recommend future studies on the 3T Skyra magnet for optimum evaluation.   MRI 09/22/22 1. The segment 7 1.5 cm observation demonstrates arterial phase non rim enhancement and threshold growth. Considered LR 5.  She subsequently underwent combined DEB-TACE and microwave ablation on 10/16/2022.  She was admitted overnight for observation and discharged home the following morning in good condition.  Her recovery was uneventful.   MRI  02/25/20 Status post treatment of new segment 7 lesion on the prior examination. No residual neoplasm identified today in this location. 2. Stable appearance of the segment 8/4A posttreatment bed. No signs of local recurrence in this lesion either.    We had our 39-monthfollow-up visit today.  She is doing well and in her usual state of good health.  She has no active complaints at this time. She was very happy to hear the positive results of her MR scan.  Past Medical History:  Diagnosis Date   Allergy    seasonal   Anemia    Cataract    bilateral - MD just watching   Cir[place]osis [place]   Diverticulosis    Edema    Elevated glucose    no longer an issue 03-18-16   Fatty liver 2017   GERD (gastroesophageal reflux disease)    H/O seasonal allergies    Hepatitis C    hepatitis c completed  tx march 2017   Hyperlipidemia 2011   Hypertension    LBP (low back pain)    Liver cancer ([place] 2017   IN REMISSION. WHEN IDAGNONED SIZE OF GRAPEFRUIT    Morbid obesity ([place]    Osteoarthritis    B knees   Right middle lobe pulmonary nodule    resolved 2018   Uterine fibroid    Wears glasses     Past Surgical History:  Procedure Laterality Date   BIOPSY  07/04/2019   Procedure: BIOPSY;  Surgeon: PJerene Bears MD;  [facility];  Service: Gastroenterology;;   BIOPSY  07/11/2020   Procedure: BIOPSY;  Surgeon: CLavena Bullion DO;  [facility];  Service: Gastroenterology;;  CHOLECYSTECTOMY     COLONOSCOPY N/A 01/09/2015   Procedure: COLONOSCOPY;  Surgeon: Jerene Bears, MD;  [facility];  Service: Gastroenterology;  Laterality: N/A;   COLONOSCOPY WITH PROPOFOL N/A 06/06/2020   Procedure: COLONOSCOPY WITH PROPOFOL;  Surgeon: Lavena Bullion, DO;  [facility];  Service: Gastroenterology;  Laterality: N/A;   COLONOSCOPY WITH PROPOFOL N/A 07/11/2020   Procedure: COLONOSCOPY WITH PROPOFOL;  Surgeon: Lavena Bullion, DO;  [facility];   Service: Gastroenterology;  Laterality: N/A;   ESOPHAGOGASTRODUODENOSCOPY (EGD) WITH PROPOFOL N/A 06/10/2016   Procedure: ESOPHAGOGASTRODUODENOSCOPY (EGD) WITH PROPOFOL;  Surgeon: Jerene Bears, MD;  [facility];  Service: Gastroenterology;  Laterality: N/A;   ESOPHAGOGASTRODUODENOSCOPY (EGD) WITH PROPOFOL N/A 07/04/2019   Procedure: ESOPHAGOGASTRODUODENOSCOPY (EGD) WITH PROPOFOL;  Surgeon: Jerene Bears, MD;  [facility];  Service: Gastroenterology;  Laterality: N/A;   HYSTEROSCOPY WITH D & C N/A 10/15/2018   Procedure: DILATATION AND CURETTAGE /HYSTEROSCOPY;  Surgeon: Aloha Gell, MD;  Location: [facility];  Service: Gynecology;  Laterality: N/A;   IR 3D INDEPENDENT WKST  10/15/2022   IR 3D INDEPENDENT WKST  10/15/2022   IR ANGIOGRAM SELECTIVE EACH ADDITIONAL VESSEL  10/15/2022   IR ANGIOGRAM SELECTIVE EACH ADDITIONAL VESSEL  10/15/2022   IR ANGIOGRAM SELECTIVE EACH ADDITIONAL VESSEL  10/15/2022   IR ANGIOGRAM SELECTIVE EACH ADDITIONAL VESSEL  10/15/2022   IR ANGIOGRAM VISCERAL SELECTIVE  10/15/2022   IR EMBO TUMOR ORGAN ISCHEMIA INFARCT INC GUIDE ROADMAPPING  10/15/2022   IR GENERIC HISTORICAL  07/03/2016   IR RADIOLOGIST EVAL & MGMT 07/03/2016 [name], MD [facility]   IR GENERIC HISTORICAL  11/18/2016   IR RADIOLOGIST EVAL & MGMT 11/18/2016 [name], MD [facility]   IR RADIOLOGIST EVAL & MGMT  04/09/2017   IR RADIOLOGIST EVAL & MGMT  11/05/2017   IR RADIOLOGIST EVAL & MGMT  03/13/2020   IR RADIOLOGIST EVAL & MGMT  08/30/2020   IR RADIOLOGIST EVAL & MGMT  04/10/2021   IR RADIOLOGIST EVAL & MGMT  06/04/2022   IR RADIOLOGIST EVAL & MGMT  09/23/2022   IR RADIOLOGIST EVAL & MGMT  11/03/2022   IR RADIOLOGIST EVAL & MGMT  03/02/2023   IR US GUIDE VASC ACCESS RIGHT  10/15/2022   JOINT REPLACEMENT     BILATERAL KNEES   left shoulder arthroplasty     POLYPECTOMY  06/06/2020   Procedure: POLYPECTOMY;  Surgeon: Lavena Bullion, DO;  [facility];   Service: Gastroenterology;;   POLYPECTOMY  07/11/2020   Procedure: POLYPECTOMY;  Surgeon: Lavena Bullion, DO;  [facility];  Service: Gastroenterology;;   RADIOLOGY WITH ANESTHESIA N/A 10/15/2022   Procedure: IR WITH ANESTHESIA MICROWAVE ABLATION OF LIVER;  Surgeon: Criselda Peaches, MD;  Location[facility];  Service: Radiology;  Laterality: N/A;   right knee replacement  2007   total knee replacement L  03-13-08   TUBAL LIGATION      Allergies: Amlodipine, Ace inhibitors, Benazepril hcl, and Codeine  Medications: Prior to Admission medications   Medication Sig Start Date End Date Taking? Authorizing Provider  aspirin EC 81 MG tablet Take 81 mg by mouth daily.    [provider]  BLACK COHOSH EXTRACT PO Take 1 tablet by mouth daily.    [provider]  cephALEXin (KEFLEX) 500 MG capsule Take 1 capsule (500 mg total) by mouth 4 (four) times daily. 12/01/22   Plotnikov, Evie Lacks, MD  Cholecalciferol 25 MCG (1000 UT)  tablet Take 1,000 Units by mouth daily.     [provider]  furosemide (LASIX) 20 MG tablet Take 1-2 tablets (20-40 mg total) by mouth daily. Patient taking differently: Take 20 mg by mouth daily. 08/28/21   Plotnikov, Evie Lacks, MD  glucosamine-chondroitin 500-400 MG tablet Take 1 tablet by mouth daily.    [provider]  loratadine (CLARITIN) 10 MG tablet Take 1 tablet by mouth once daily 01/16/23   Plotnikov, Evie Lacks, MD  losartan (COZAAR) 100 MG tablet Take 1 tablet (100 mg total) by mouth daily. 12/01/22   Plotnikov, Evie Lacks, MD  meloxicam (MOBIC) 7.5 MG tablet TAKE 1 TO 2 TABLETS BY MOUTH ONCE DAILY AS NEEDED FOR PAIN 12/01/22   Plotnikov, Evie Lacks, MD  Multiple Vitamin (MULTIVITAMIN WITH MINERALS) TABS tablet Take 1 tablet by mouth daily.     [provider]  pantoprazole (PROTONIX) 40 MG tablet Take 1 tablet (40 mg total) by mouth daily. 12/01/22   Plotnikov, Evie Lacks, MD  potassium chloride SA (KLOR-CON M) 20  MEQ tablet Take 1 tablet (20 mEq total) by mouth daily. 12/01/22   Plotnikov, Evie Lacks, MD  silver sulfADIAZINE (SILVADENE) 1 % cream Apply 1 application topically daily. 09/09/21   Jaynee Eagles, PA-C  trolamine salicylate (ASPERCREME) 10 % cream Apply 1 Application topically as needed for muscle pain.    [provider]  vitamin C (ASCORBIC ACID) 500 MG tablet Take 500 mg by mouth daily.    [provider]  promethazine (PHENERGAN) 12.5 MG tablet Take 1 tablet (12.5 mg total) by mouth every 6 (six) hours as needed for nausea or vomiting. 08/09/19 09/27/20  Janith Lima, MD     Family History  Problem Relation Age of Onset   Hypertension Mother    Ovarian cancer Mother    Hypertension Father    Cancer Father 18       prostate cancer    Diabetes Father    Diabetes Other    Hypertension Other    Colon cancer Neg Hx    Esophageal cancer Neg Hx    Rectal cancer Neg Hx    Stomach cancer Neg Hx    Breast cancer Neg Hx     Social History   Socioeconomic History   Marital status: Single    Spouse name: Not on file   Number of children: 4   Years of education: Not on file   Highest education level: Not on file  Occupational History   Not on file  Tobacco Use   Smoking status: Former    Packs/day: 0.25    Years: 20.00    Total pack years: 5.00    Types: Cigarettes    Quit date: 12/30/2007    Years since quitting: 15.1   Smokeless tobacco: Never  Vaping Use   Vaping Use: Never used  Substance and Sexual Activity   Alcohol use: No    Comment: 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, quit drinking alcohol in 10/2015    Drug use: No   Sexual activity: Not Currently    Birth control/protection: Post-menopausal  Other Topics Concern   Not on file  Social History Narrative   Regular exercise: walk a few times a week   Caffeine use: none   Single, raising her young great granddaughter and watches grandchildren after school   Has a room mate to help prn    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 [employer]  Independent/drives   Social Determinants of Health   Financial Resource Strain: Low Risk  (02/03/2023)   Overall Financial Resource Strain (CARDIA)    Difficulty of Paying Living Expenses: Not hard at all  Food Insecurity: No Food Insecurity (02/03/2023)   Hunger Vital Sign    Worried About Running Out of Food in the Last Year: Never true    Ran Out of Food in the Last Year: Never true  Transportation Needs: No Transportation Needs (02/03/2023)   PRAPARE - Hydrologist (Medical): No    Lack of Transportation (Non-Medical): No  Physical Activity: Sufficiently Active (02/03/2023)   Exercise Vital Sign    Days of Exercise per Week: 5 days    Minutes of Exercise per Session: 30 min  Stress: No Stress Concern Present (02/03/2023)   [place]    Feeling of Stress : Not at all  Social Connections: Moderately Isolated (02/03/2023)   Social Connection and Isolation Panel [NHANES]    Frequency of Communication with Friends and Family: Three times a week    Frequency of Social Gatherings with Friends and Family: Three times a week    Attends Religious Services: More than 4 times per year    Active Member of Clubs or Organizations: No    Attends Archivist Meetings: Never    Marital Status: Never married    ECOG Status: 0 - Asymptomatic  Review of Systems  Review of Systems: A 12 point ROS discussed and pertinent positives are indicated in the HPI above.  All other systems are negative.  Physical Exam No direct physical exam was performed (except for noted visual exam findings with Video Visits).    Vital Signs: LMP  (LMP Unknown)   Imaging: IR Radiologist Eval & Mgmt  Result Date: 03/02/2023 EXAM: ESTABLISHED PATIENT OFFICE VISIT CHIEF COMPLAINT: SEE EPIC NOTE HISTORY OF PRESENT ILLNESS: SEE EPIC NOTE REVIEW OF SYSTEMS: SEE  EPIC NOTE PHYSICAL EXAMINATION: SEE EPIC NOTE ASSESSMENT AND PLAN: SEE EPIC NOTE Electronically Signed   By: [name] M.D.   On: 03/02/2023 15:11   MR ABDOMEN WWO CONTRAST  Result Date: 02/25/2023 CLINICAL DATA:  Chronic liver disease with Karlene Lineman and [facility]. Status post chemoembolization and microwave ablation of hepatic lesion EXAM: MRI ABDOMEN WITHOUT AND WITH CONTRAST TECHNIQUE: Multiplanar multisequence MR imaging of the abdomen was performed both before and after the administration of intravenous contrast. CONTRAST:  Vueway 10 cc from a single use bottle.  0 cc discarded COMPARISON:  Pretreatment MRI 09/22/2022. Embolization treatment procedure 10/15/2022 FINDINGS: Lower chest: No pleural effusion lung bases. No pericardial effusion. Hepatobiliary: Slight nodular contour of the liver. No biliary ductal dilatation. Previous cholecystectomy. Patent portal vein. Diameter of the main portal vein measures 10 mm at the liver hilum. Central branches of the portal vein are also grossly preserved. Previous lesion in segment 7 of the liver which was hypervascular is no longer seen. No residual arterial enhancement or bright T2 signal in this location. There is a posttreatment defect identified measuring on series 14, image 33 of 4.2 by 2.3 cm. This area is dark on precontrast T1 fat-sat. Slightly heterogeneous dark on in out of phase and dark on T2. No restricted diffusion in this location. Separately there is a similar treatment defect in the liver along the margin of segment 8 and 4 which is essentially unchanged from prior examination. Again no residual arterial enhancement. The lesion on precontrast T1 is  slightly heterogeneous with some mild increased signal centrally, nonspecific. This defect previously measured 2.6 x 2.2 cm and today series 14 image 50 measures 3.0 x 2.1 cm. Measurement is more closely similar to the study of 06/03/2022. Nonspecific. No new hypervascular or aggressive liver lesion at this  time. Pancreas: No mass, inflammatory changes, or other parenchymal abnormality identified. No pancreatic ductal dilatation. Spleen: Within normal limits in size and appearance. No clear enlargement or abnormal signal. Adrenals/Urinary Tract: Adrenal glands are preserved. No enhancing renal mass or collecting system dilatation. Several punctate sub 5 mm simple appearing bilateral renal cystic foci are seen. Bosniak 1 lesions. No specific imaging follow-up. Stomach/Bowel: Few colonic diverticula. Visualized small and large bowel in the abdomen are nondilated. The stomach is nondilated. No abnormal enhancement. Vascular/Lymphatic: Normal caliber aorta and IVC. No developing abnormal lymph node enlargement. There are a few small less than 1 cm in size upper abdominal retroperitoneal nodes, nonpathologic by size criteria. Similar right anterior cardiophrenic node. Other:  No ascites. Musculoskeletal: No suspicious bone lesions identified. IMPRESSION: Status post treatment of new segment 7 lesion on the prior examination. No residual neoplasm identified today in this location. Stable appearance of the segment 8/4A posttreatment bed. No signs of local recurrence in this lesion either. Again changes of chronic liver disease. No ascites, splenomegaly. No significant varices. Patent portal vein Electronically Signed   By: Jill Side M.D.   On: 02/25/2023 12:07    Labs:  CBC: Recent Labs    10/08/22 1430 10/15/22 0700 10/16/22 0438 10/27/22 1249  WBC 3.4* 3.7* 6.5 4.1  HGB 12.7 11.8* 11.3* 11.8  HCT 40.9 40.4 37.2 37.3  PLT 203 182 177 289    COAGS: Recent Labs    09/12/22 1116 10/08/22 1430 10/15/22 0700 10/27/22 1249  INR 1.0 1.0 1.0 1.0    BMP: Recent Labs    10/08/22 1430 10/15/22 0700 10/16/22 0438 10/27/22 1249  NA 142 141 141 142  K 3.8 5.5* 4.1 4.5  CL 110 112* 113* 108  CO2 '25 24 24 22  '$ GLUCOSE 95 81 148* 96  BUN '15 15 15 13  '$ CALCIUM 9.2 8.8* 8.4* 9.2  CREATININE 0.94 1.05*  0.97 0.93  GFRNONAA >60 56* >60  --     LIVER FUNCTION TESTS: Recent Labs    04/16/22 1022 09/12/22 1116 10/08/22 1430 10/15/22 0700 10/16/22 0438 10/27/22 1249  BILITOT 0.4   < > 0.7 1.2 0.4 0.4  AST 21   < > 18 28 160* 17  ALT 11   < > 12 8 138* 29  ALKPHOS 92  --  78 86 66  --   PROT 7.8   < > 7.6 7.2 6.2* 7.1  ALBUMIN 4.4  --  4.0 3.7 3.2*  --    < > = values in this interval not displayed.    TUMOR MARKERS: Recent Labs    09/12/22 1116  AFPTM 10.1*    Assessment and Plan:  Mrs. Reckers is doing extremely well 3 months status post combined DEB-TACE and percutaneous microwave ablation of hepatocellular carcinoma.  Her most recent MR imaging demonstrates a complete response to therapy.  No evidence of residual or new disease.  Clinically, she is doing extremely well.  We will continue close imaging surveillance per protocol.  1.)  MRI of the abdomen with gadolinium contrast in 3 months followed by clinic visit.    Electronically Signed: Criselda Peaches 03/02/2023, 3:17 PM   I spent a total of  15 Minutes in remote  clinical consultation, greater than 50% of which was counseling/coordinating care for NASH cirrhosis complicated by hepatocellular cancer.    Visit type: Audio only (telephone). Audio (no video) only due to patient preference. Alternative for in-person consultation at [facility], [address] [place], [place], [place]. This visit type was conducted due to national recommendations for restrictions regarding the COVID-19 Pandemic (e.g. social distancing).  This format is felt to be most appropriate for this patient at this time.  All issues noted in this document were discussed and addressed.

## 2023-04-14 DIAGNOSIS — H5213 Myopia, bilateral: Secondary | ICD-10-CM | POA: Diagnosis not present

## 2023-04-14 DIAGNOSIS — H401131 Primary open-angle glaucoma, bilateral, mild stage: Secondary | ICD-10-CM | POA: Diagnosis not present

## 2023-04-30 ENCOUNTER — Other Ambulatory Visit: Payer: Self-pay | Admitting: Internal Medicine

## 2023-06-01 DIAGNOSIS — H40053 Ocular hypertension, bilateral: Secondary | ICD-10-CM | POA: Diagnosis not present

## 2023-06-01 DIAGNOSIS — H2513 Age-related nuclear cataract, bilateral: Secondary | ICD-10-CM | POA: Diagnosis not present

## 2023-06-02 ENCOUNTER — Encounter: Payer: Self-pay | Admitting: Internal Medicine

## 2023-06-02 ENCOUNTER — Ambulatory Visit (INDEPENDENT_AMBULATORY_CARE_PROVIDER_SITE_OTHER): Payer: Medicare Other | Admitting: Internal Medicine

## 2023-06-02 DIAGNOSIS — Z0000 Encounter for general adult medical examination without abnormal findings: Secondary | ICD-10-CM

## 2023-06-02 DIAGNOSIS — R600 Localized edema: Secondary | ICD-10-CM | POA: Diagnosis not present

## 2023-06-02 DIAGNOSIS — C220 Liver cell carcinoma: Secondary | ICD-10-CM | POA: Diagnosis not present

## 2023-06-02 DIAGNOSIS — E785 Hyperlipidemia, unspecified: Secondary | ICD-10-CM | POA: Diagnosis not present

## 2023-06-02 DIAGNOSIS — G8929 Other chronic pain: Secondary | ICD-10-CM

## 2023-06-02 DIAGNOSIS — E782 Mixed hyperlipidemia: Secondary | ICD-10-CM | POA: Diagnosis not present

## 2023-06-02 DIAGNOSIS — K7581 Nonalcoholic steatohepatitis (NASH): Secondary | ICD-10-CM

## 2023-06-02 DIAGNOSIS — R7989 Other specified abnormal findings of blood chemistry: Secondary | ICD-10-CM

## 2023-06-02 DIAGNOSIS — R739 Hyperglycemia, unspecified: Secondary | ICD-10-CM

## 2023-06-02 DIAGNOSIS — M5440 Lumbago with sciatica, unspecified side: Secondary | ICD-10-CM | POA: Diagnosis not present

## 2023-06-02 DIAGNOSIS — I10 Essential (primary) hypertension: Secondary | ICD-10-CM | POA: Diagnosis not present

## 2023-06-02 DIAGNOSIS — R350 Frequency of micturition: Secondary | ICD-10-CM | POA: Diagnosis not present

## 2023-06-02 DIAGNOSIS — K7460 Unspecified cirrhosis of liver: Secondary | ICD-10-CM

## 2023-06-02 LAB — URINALYSIS, ROUTINE W REFLEX MICROSCOPIC
Bilirubin Urine: NEGATIVE
Hgb urine dipstick: NEGATIVE
Ketones, ur: NEGATIVE
Nitrite: NEGATIVE
RBC / HPF: NONE SEEN (ref 0–?)
Specific Gravity, Urine: 1.025 (ref 1.000–1.030)
Total Protein, Urine: NEGATIVE
Urine Glucose: NEGATIVE
Urobilinogen, UA: 0.2 (ref 0.0–1.0)
pH: 6 (ref 5.0–8.0)

## 2023-06-02 LAB — COMPREHENSIVE METABOLIC PANEL
ALT: 10 U/L (ref 0–35)
AST: 14 U/L (ref 0–37)
Albumin: 3.9 g/dL (ref 3.5–5.2)
Alkaline Phosphatase: 83 U/L (ref 39–117)
BUN: 17 mg/dL (ref 6–23)
CO2: 25 mEq/L (ref 19–32)
Calcium: 9.2 mg/dL (ref 8.4–10.5)
Chloride: 108 mEq/L (ref 96–112)
Creatinine, Ser: 0.95 mg/dL (ref 0.40–1.20)
GFR: 59.38 mL/min — ABNORMAL LOW (ref >60.00)
Glucose, Bld: 99 mg/dL (ref 70–99)
Potassium: 3.9 mEq/L (ref 3.5–5.1)
Sodium: 141 mEq/L (ref 135–145)
Total Bilirubin: 0.3 mg/dL (ref 0.2–1.2)
Total Protein: 7.1 g/dL (ref 6.0–8.3)

## 2023-06-02 LAB — CBC WITH DIFFERENTIAL/PLATELET
Basophils Absolute: 0 10*3/uL (ref 0.0–0.1)
Basophils Relative: 1 % (ref 0.0–3.0)
Eosinophils Absolute: 0.1 10*3/uL (ref 0.0–0.7)
Eosinophils Relative: 3.9 % (ref 0.0–5.0)
HCT: 37.4 % (ref 36.0–46.0)
Hemoglobin: 11.8 g/dL — ABNORMAL LOW (ref 12.0–15.0)
Lymphocytes Relative: 30.3 % (ref 12.0–46.0)
Lymphs Abs: 1.1 10*3/uL (ref 0.7–4.0)
MCHC: 31.5 g/dL (ref 30.0–36.0)
MCV: 84.4 fl (ref 78.0–100.0)
Monocytes Absolute: 0.4 10*3/uL (ref 0.1–1.0)
Monocytes Relative: 12.3 % — ABNORMAL HIGH (ref 3.0–12.0)
Neutro Abs: 1.9 10*3/uL (ref 1.4–7.7)
Neutrophils Relative %: 52.5 % (ref 43.0–77.0)
Platelets: 183 10*3/uL (ref 150.0–400.0)
RBC: 4.43 Mil/uL (ref 3.87–5.11)
RDW: 13.9 % (ref 11.5–15.5)
WBC: 3.7 10*3/uL — ABNORMAL LOW (ref 4.0–10.5)

## 2023-06-02 LAB — LIPID PANEL
Cholesterol: 152 mg/dL (ref 0–200)
HDL: 42.3 mg/dL (ref >39.00)
LDL Cholesterol: 91 mg/dL (ref 0–99)
NonHDL: 109.92
Total CHOL/HDL Ratio: 4
Triglycerides: 95 mg/dL (ref 0.0–149.0)
VLDL: 19 mg/dL (ref 0.0–40.0)

## 2023-06-02 LAB — TSH: TSH: 2.35 u[IU]/mL (ref 0.35–5.50)

## 2023-06-02 MED ORDER — PANTOPRAZOLE SODIUM 40 MG PO TBEC: 40.0000 mg | DELAYED_RELEASE_TABLET | Freq: Every day | ORAL | 3 refills | Status: DC

## 2023-06-02 MED ORDER — FUROSEMIDE 20 MG PO TABS: ORAL_TABLET | ORAL | 3 refills | Status: DC

## 2023-06-02 MED ORDER — MELOXICAM 7.5 MG PO TABS: ORAL_TABLET | ORAL | 1 refills | Status: DC

## 2023-06-02 MED ORDER — POTASSIUM CHLORIDE CRYS ER 20 MEQ PO TBCR: 20.0000 meq | EXTENDED_RELEASE_TABLET | Freq: Every day | ORAL | 3 refills | Status: DC

## 2023-06-02 MED ORDER — LORATADINE 10 MG PO TABS: 10.0000 mg | ORAL_TABLET | Freq: Every day | ORAL | 3 refills | Status: DC

## 2023-06-02 MED ORDER — LOSARTAN POTASSIUM 100 MG PO TABS: 100.0000 mg | ORAL_TABLET | Freq: Every day | ORAL | 3 refills | Status: DC

## 2023-06-02 NOTE — Progress Notes (Signed)
Subjective:  Patient ID: Samantha [name], female    DOB: [birth year]  Age: 72 y.o. MRN: [id number]  CC: Follow-up (6 MNTH F/U)   HPI Kailanni Backhus presents for OA, liver cancer, GERD  Outpatient Medications Prior to Visit  Medication Sig Dispense Refill   aspirin EC 81 MG tablet Take 81 mg by mouth daily.     BLACK COHOSH EXTRACT PO Take 1 tablet by mouth daily.     cephALEXin (KEFLEX) 500 MG capsule Take 1 capsule (500 mg total) by mouth 4 (four) times daily. 12 capsule 1   Cholecalciferol 25 MCG (1000 UT) tablet Take 1,000 Units by mouth daily.      glucosamine-chondroitin 500-400 MG tablet Take 1 tablet by mouth daily.     latanoprost (XALATAN) 0.005 % ophthalmic solution 1 drop at bedtime.     Multiple Vitamin (MULTIVITAMIN WITH MINERALS) TABS tablet Take 1 tablet by mouth daily.      silver sulfADIAZINE (SILVADENE) 1 % cream Apply 1 application topically daily. 100 g 0   trolamine salicylate (ASPERCREME) 10 % cream Apply 1 Application topically as needed for muscle pain.     vitamin C (ASCORBIC ACID) 500 MG tablet Take 500 mg by mouth daily.     furosemide (LASIX) 20 MG tablet TAKE 1 TO 2 TABLETS BY MOUTH ONCE DAILY 180 tablet 2   loratadine (CLARITIN) 10 MG tablet Take 1 tablet by mouth once daily 90 tablet 3   losartan (COZAAR) 100 MG tablet Take 1 tablet (100 mg total) by mouth daily. 90 tablet 3   meloxicam (MOBIC) 7.5 MG tablet TAKE 1 TO 2 TABLETS BY MOUTH ONCE DAILY AS NEEDED FOR PAIN 180 tablet 0   pantoprazole (PROTONIX) 40 MG tablet Take 1 tablet (40 mg total) by mouth daily. 90 tablet 3   potassium chloride SA (KLOR-CON M) 20 MEQ tablet Take 1 tablet (20 mEq total) by mouth daily. 90 tablet 3   No facility-administered medications prior to visit.    ROS: Review of Systems  Constitutional:  Negative for activity change, appetite change, chills, fatigue and unexpected weight change.  HENT:  Negative for congestion, mouth sores and sinus pressure.   Eyes:   Negative for visual disturbance.  Respiratory:  Negative for cough and chest tightness.   Gastrointestinal:  Negative for abdominal pain and nausea.  Genitourinary:  Negative for difficulty urinating, frequency and vaginal pain.  Musculoskeletal:  Positive for arthralgias and gait problem. Negative for back pain.  Skin:  Negative for pallor and rash.  Neurological:  Negative for dizziness, tremors, weakness, numbness and headaches.  Psychiatric/Behavioral:  Negative for confusion and sleep disturbance. The patient is not nervous/anxious.     Objective:  BP 118/70 (BP Location: Left Arm, Patient Position: Sitting, Cuff Size: Large)   Pulse 93   Temp 98.6 F (37 C) (Oral)   Ht 5' (1.524 m)   Wt 290 lb (131.5 kg)   LMP  (LMP Unknown)   SpO2 99%   BMI 56.64 kg/m   BP Readings from Last 3 Encounters:  06/02/23 118/70  02/03/23 122/70  12/01/22 124/78    Wt Readings from Last 3 Encounters:  06/02/23 290 lb (131.5 kg)  02/03/23 294 lb 9.6 oz (133.6 kg)  12/01/22 295 lb (133.8 kg)    Physical Exam Constitutional:      General: She is not in acute distress.    Appearance: She is well-developed. She is obese.  HENT:     Head: Normocephalic.  Right Ear: External ear normal.     Left Ear: External ear normal.     Nose: Nose normal.  Eyes:     General:        Right eye: No discharge.        Left eye: No discharge.     Conjunctiva/sclera: Conjunctivae normal.     Pupils: Pupils are equal, round, and reactive to light.  Neck:     Thyroid: No thyromegaly.     Vascular: No JVD.     Trachea: No tracheal deviation.  Cardiovascular:     Rate and Rhythm: Normal rate and regular rhythm.     Heart sounds: Normal heart sounds.  Pulmonary:     Effort: No respiratory distress.     Breath sounds: No stridor. No wheezing.  Abdominal:     General: Bowel sounds are normal. There is no distension.     Palpations: Abdomen is soft. There is no mass.     Tenderness: There is no  abdominal tenderness. There is no guarding or rebound.  Musculoskeletal:        General: No tenderness.     Cervical back: Normal range of motion and neck supple. No rigidity.  Lymphadenopathy:     Cervical: No cervical adenopathy.  Skin:    Findings: No erythema or rash.  Neurological:     Cranial Nerves: No cranial nerve deficit.     Motor: No abnormal muscle tone.     Coordination: Coordination normal.     Gait: Gait abnormal.     Deep Tendon Reflexes: Reflexes normal.  Psychiatric:        Behavior: Behavior normal.        Thought Content: Thought content normal.        Judgment: Judgment normal.   Antalgic gait LS w/pain  Lab Results  Component Value Date   WBC 4.1 10/27/2022   HGB 11.8 10/27/2022   HCT 37.3 10/27/2022   PLT 289 10/27/2022   GLUCOSE 96 10/27/2022   CHOL 156 05/22/2020   TRIG 93.0 05/22/2020   HDL 43.30 05/22/2020   LDLCALC 94 05/22/2020   ALT 29 10/27/2022   AST 17 10/27/2022   NA 142 10/27/2022   K 4.5 10/27/2022   CL 108 10/27/2022   CREATININE 0.93 10/27/2022   BUN 13 10/27/2022   CO2 22 10/27/2022   TSH 1.67 04/16/2022   INR 1.0 10/27/2022   HGBA1C 6.1 12/31/2021    IR Radiologist Eval & Mgmt  Result Date: 03/02/2023 EXAM: ESTABLISHED PATIENT OFFICE VISIT CHIEF COMPLAINT: SEE EPIC NOTE HISTORY OF PRESENT ILLNESS: SEE EPIC NOTE REVIEW OF SYSTEMS: SEE EPIC NOTE PHYSICAL EXAMINATION: SEE EPIC NOTE ASSESSMENT AND PLAN: SEE EPIC NOTE Electronically Signed   By: Malachy Moan M.D.   On: 03/02/2023 15:11    Assessment & Plan:   Problem List Items Addressed This Visit     OBESITY, MORBID - Primary    Better      Essential hypertension    Chronic Cont on Losartan, Norvasc, Lasix Check labs -- see orders      Relevant Medications   furosemide (LASIX) 20 MG tablet   losartan (COZAAR) 100 MG tablet   Low back pain    Handicapped form was filled out      Relevant Medications   meloxicam (MOBIC) 7.5 MG tablet   Elevated LFTs     Check LFTs      Hepatocellular carcinoma (HCC)    S/p recent ablation - f/u  w/ Dr Archer Asa (IR)      Liver cirrhosis secondary to NASH [facility])    F/u w/Dr Rhea Belton, Dr Luciana Axe      Edema    Lasix, Wt loss      Hyperlipidemia    On diet      Relevant Medications   furosemide (LASIX) 20 MG tablet   losartan (COZAAR) 100 MG tablet      Meds ordered this encounter  Medications   furosemide (LASIX) 20 MG tablet    Sig: TAKE 1 TO 2 TABLETS BY MOUTH ONCE DAILY    Dispense:  180 tablet    Refill:  3   loratadine (CLARITIN) 10 MG tablet    Sig: Take 1 tablet (10 mg total) by mouth daily.    Dispense:  90 tablet    Refill:  3   losartan (COZAAR) 100 MG tablet    Sig: Take 1 tablet (100 mg total) by mouth daily.    Dispense:  90 tablet    Refill:  3   meloxicam (MOBIC) 7.5 MG tablet    Sig: TAKE 1 TO 2 TABLETS BY MOUTH ONCE DAILY AS NEEDED FOR PAIN    Dispense:  180 tablet    Refill:  1   pantoprazole (PROTONIX) 40 MG tablet    Sig: Take 1 tablet (40 mg total) by mouth daily.    Dispense:  90 tablet    Refill:  3   potassium chloride SA (KLOR-CON M) 20 MEQ tablet    Sig: Take 1 tablet (20 mEq total) by mouth daily.    Dispense:  90 tablet    Refill:  3      Follow-up: Return in about 3 months (around 09/02/2023) for a follow-up visit.  Sonda Primes, MD

## 2023-06-02 NOTE — Assessment & Plan Note (Signed)
S/p recent ablation - f/u w/ Dr Archer Asa (IR)

## 2023-06-02 NOTE — Assessment & Plan Note (Signed)
Better  

## 2023-06-02 NOTE — Assessment & Plan Note (Signed)
F/u w/Dr Pyrtle, Dr Comer ?

## 2023-06-02 NOTE — Assessment & Plan Note (Signed)
  On diet  

## 2023-06-02 NOTE — Assessment & Plan Note (Signed)
Chronic Cont on Losartan, Norvasc, Lasix Check labs -- see orders

## 2023-06-02 NOTE — Assessment & Plan Note (Signed)
Handicapped form was filled out 

## 2023-06-02 NOTE — Assessment & Plan Note (Signed)
Lasix, Wt loss 

## 2023-06-02 NOTE — Assessment & Plan Note (Signed)
Check LFTs 

## 2023-06-17 ENCOUNTER — Other Ambulatory Visit: Payer: Self-pay | Admitting: Internal Medicine

## 2023-06-17 DIAGNOSIS — Z0000 Encounter for general adult medical examination without abnormal findings: Secondary | ICD-10-CM

## 2023-07-20 ENCOUNTER — Ambulatory Visit
Admission: RE | Admit: 2023-07-20 | Discharge: 2023-07-20 | Disposition: A | Discharge status: Home / Self Care | Payer: Medicare Other | Source: Ambulatory Visit | Attending: Internal Medicine | Admitting: Internal Medicine

## 2023-07-20 DIAGNOSIS — Z0000 Encounter for general adult medical examination without abnormal findings: Secondary | ICD-10-CM

## 2023-07-20 DIAGNOSIS — Z1231 Encounter for screening mammogram for malignant neoplasm of breast: Secondary | ICD-10-CM | POA: Diagnosis not present

## 2023-07-27 ENCOUNTER — Other Ambulatory Visit: Payer: Self-pay | Admitting: Nurse Practitioner

## 2023-07-27 DIAGNOSIS — C220 Liver cell carcinoma: Secondary | ICD-10-CM | POA: Diagnosis not present

## 2023-07-27 DIAGNOSIS — K7469 Other cirrhosis of liver: Secondary | ICD-10-CM

## 2023-07-27 DIAGNOSIS — D751 Secondary polycythemia: Secondary | ICD-10-CM

## 2023-08-25 ENCOUNTER — Other Ambulatory Visit: Payer: Self-pay | Admitting: Internal Medicine

## 2023-09-10 ENCOUNTER — Ambulatory Visit
Admission: RE | Admit: 2023-09-10 | Discharge: 2023-09-10 | Disposition: A | Discharge status: Home / Self Care | Payer: Medicare Other | Source: Ambulatory Visit | Attending: Nurse Practitioner | Admitting: Nurse Practitioner

## 2023-09-10 ENCOUNTER — Other Ambulatory Visit: Payer: Self-pay | Admitting: Interventional Radiology

## 2023-09-10 DIAGNOSIS — C220 Liver cell carcinoma: Secondary | ICD-10-CM

## 2023-09-10 DIAGNOSIS — Z8505 Personal history of malignant neoplasm of liver: Secondary | ICD-10-CM | POA: Diagnosis not present

## 2023-09-10 DIAGNOSIS — K7469 Other cirrhosis of liver: Secondary | ICD-10-CM

## 2023-09-10 DIAGNOSIS — K5730 Diverticulosis of large intestine without perforation or abscess without bleeding: Secondary | ICD-10-CM | POA: Diagnosis not present

## 2023-09-10 MED ORDER — GADOPICLENOL 0.5 MMOL/ML IV SOLN
10.0000 mL | Freq: Once | INTRAVENOUS | Status: AC | PRN
  Administered 2023-09-10: 10 mL via INTRAVENOUS

## 2023-09-24 ENCOUNTER — Ambulatory Visit
Admission: RE | Admit: 2023-09-24 | Discharge: 2023-09-24 | Disposition: A | Discharge status: Home / Self Care | Payer: Medicare Other | Source: Ambulatory Visit | Attending: Interventional Radiology | Admitting: Interventional Radiology

## 2023-09-24 ENCOUNTER — Ambulatory Visit (INDEPENDENT_AMBULATORY_CARE_PROVIDER_SITE_OTHER): Payer: Medicare Other

## 2023-09-24 DIAGNOSIS — C220 Liver cell carcinoma: Secondary | ICD-10-CM | POA: Diagnosis not present

## 2023-09-24 DIAGNOSIS — K7469 Other cirrhosis of liver: Secondary | ICD-10-CM

## 2023-09-24 DIAGNOSIS — Z23 Encounter for immunization: Secondary | ICD-10-CM

## 2023-09-24 DIAGNOSIS — K7581 Nonalcoholic steatohepatitis (NASH): Secondary | ICD-10-CM | POA: Diagnosis not present

## 2023-09-24 HISTORY — PX: IR RADIOLOGIST EVAL & MGMT: IMG5224

## 2023-09-24 NOTE — Progress Notes (Cosign Needed Addendum)
Patient presented in office today for her HD Flu Vaccine. HD Flu Vaccine was administered into her Left Deltoid Muscle. She tolerated the injection well and her injection site looked fine. Patient was advised to report to the office immediately if she noticed any adverse reactions.   Medical screening examination/treatment/procedure(s) were performed by non-physician practitioner and as supervising physician I was immediately available for consultation/collaboration.  I agree with above. Jacinta Shoe, MD

## 2023-09-24 NOTE — Progress Notes (Signed)
Chief Complaint: Patient was consulted remotely today (TeleHealth) for  HCV/NASH cirrhosis complicated by [facility] x2 at the request of Berit Raczkowski K.    Referring Physician(s): Konner Warrior K  History of Present Illness: Samantha [name] is a 72 y.o. 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   She underwent conventional lipiodol transarterial chemo embolization on 2/23 and then subsequent CT guided thermal ablation of this lesion on 03/21/2016.   She presents today for continued follow-up evaluation.     MRI 08/29/21:  Stable ablation defect in anterior right hepatic lobe. No evidence of recurrent carcinoma.   4 mm focus of arterial phase hyperenhancement in the lateral dome of the right hepatic lobe appears slightly more conspicuous than on prior exam, but is difficult to characterize due to its small size. Recommend continued follow-up by abdomen MRI without and with contrast in 6 months.   MRI 11/1821: 1. Similar appearance of ablation defect within segment 6/8, without local recurrence. 2. Suspect mild cirrhosis. 3. Similar size of a segment 7 observation which demonstrates arterial and persistent contrast enhancement. LR 3.   MRI 06/03/22 1. Stable segment 8/5 ablation defect. No findings suspicious for recurrent tumor. 2. Interval enlargement of the segment 7 lesion now measuring 11 mm.  Interval growth suggests a LI-RADS 4 lesion. Recommend future studies on the 3T Skyra magnet for optimum evaluation.   MRI 09/22/22 1. The segment 7 1.5 cm observation demonstrates arterial phase non rim enhancement and threshold growth. Considered LR 5.   She subsequently underwent combined DEB-TACE and microwave ablation on 10/16/2022.  She was admitted overnight for observation and discharged home the following morning in good condition.  Her recovery was uneventful.    MRI  02/24/23 Status post treatment of new segment 7 lesion on the prior examination. No residual neoplasm identified today in this location. 2. Stable appearance of the segment 8/4A posttreatment bed. No signs of local recurrence in this lesion either.  MRI 09/10/23 1. No findings of recurrent hepatocellular carcinoma. Two ablation sites noted.    We had our 68-month follow-up visit today.  She is doing well and in her usual state of good health.  She has no active complaints at this time. She was very happy to hear the positive results of her MR scan.  Past Medical History:  Diagnosis Date   Allergy    seasonal   Anemia    Cataract    bilateral - MD just watching   Cirrhosis (HCC) 2018   Diverticulosis    Edema    Elevated glucose    no longer an issue 03-18-16   Fatty liver 2017   GERD (gastroesophageal reflux disease)    H/O seasonal allergies    Hepatitis C    hepatitis c completed  tx march 2017   Hyperlipidemia 2011   Hypertension    LBP (low back pain)    Liver cancer (HCC) 2017   IN REMISSION. WHEN IDAGNONED SIZE OF GRAPEFRUIT    Morbid obesity (HCC)    Osteoarthritis    B knees   Right middle lobe pulmonary nodule    resolved 2018   Uterine fibroid    Wears glasses     Past Surgical History:  Procedure Laterality Date   BIOPSY  07/04/2019   Procedure: BIOPSY;  Surgeon: Beverley Fiedler, MD;  Location: [facility];  Service: Gastroenterology;;   BIOPSY  07/11/2020  Procedure: BIOPSY;  Surgeon: Shellia Cleverly, DO;  Location: [facility];  Service: Gastroenterology;;   CHOLECYSTECTOMY     COLONOSCOPY N/A 01/09/2015   Procedure: COLONOSCOPY;  Surgeon: Beverley Fiedler, MD;  Location: [facility];  Service: Gastroenterology;  Laterality: N/A;   COLONOSCOPY WITH PROPOFOL N/A 06/06/2020   Procedure: COLONOSCOPY WITH PROPOFOL;  Surgeon: Shellia Cleverly, DO;  Location: [facility];  Service: Gastroenterology;  Laterality: N/A;   COLONOSCOPY WITH PROPOFOL N/A 07/11/2020    Procedure: COLONOSCOPY WITH PROPOFOL;  Surgeon: Shellia Cleverly, DO;  Location: [facility];  Service: Gastroenterology;  Laterality: N/A;   ESOPHAGOGASTRODUODENOSCOPY (EGD) WITH PROPOFOL N/A 06/10/2016   Procedure: ESOPHAGOGASTRODUODENOSCOPY (EGD) WITH PROPOFOL;  Surgeon: Beverley Fiedler, MD;  Location: [facility];  Service: Gastroenterology;  Laterality: N/A;   ESOPHAGOGASTRODUODENOSCOPY (EGD) WITH PROPOFOL N/A 07/04/2019   Procedure: ESOPHAGOGASTRODUODENOSCOPY (EGD) WITH PROPOFOL;  Surgeon: Beverley Fiedler, MD;  Location: [facility];  Service: Gastroenterology;  Laterality: N/A;   HYSTEROSCOPY WITH D & C N/A 10/15/2018   Procedure: DILATATION AND CURETTAGE /HYSTEROSCOPY;  Surgeon: Noland Fordyce, MD;  Location: [facility];  Service: Gynecology;  Laterality: N/A;   IR 3D INDEPENDENT WKST  10/15/2022   IR 3D INDEPENDENT WKST  10/15/2022   IR ANGIOGRAM SELECTIVE EACH ADDITIONAL VESSEL  10/15/2022   IR ANGIOGRAM SELECTIVE EACH ADDITIONAL VESSEL  10/15/2022   IR ANGIOGRAM SELECTIVE EACH ADDITIONAL VESSEL  10/15/2022   IR ANGIOGRAM SELECTIVE EACH ADDITIONAL VESSEL  10/15/2022   IR ANGIOGRAM VISCERAL SELECTIVE  10/15/2022   IR EMBO TUMOR ORGAN ISCHEMIA INFARCT INC GUIDE ROADMAPPING  10/15/2022   IR GENERIC HISTORICAL  07/03/2016   IR RADIOLOGIST EVAL & MGMT 07/03/2016 Malachy Moan, MD [facility]   IR GENERIC HISTORICAL  11/18/2016   IR RADIOLOGIST EVAL & MGMT 11/18/2016 Malachy Moan, MD [facility]   IR RADIOLOGIST EVAL & MGMT  04/09/2017   IR RADIOLOGIST EVAL & MGMT  11/05/2017   IR RADIOLOGIST EVAL & MGMT  03/13/2020   IR RADIOLOGIST EVAL & MGMT  08/30/2020   IR RADIOLOGIST EVAL & MGMT  04/10/2021   IR RADIOLOGIST EVAL & MGMT  06/04/2022   IR RADIOLOGIST EVAL & MGMT  09/23/2022   IR RADIOLOGIST EVAL & MGMT  11/03/2022   IR RADIOLOGIST EVAL & MGMT  03/02/2023   IR RADIOLOGIST EVAL & MGMT  09/24/2023   IR US GUIDE VASC ACCESS RIGHT  10/15/2022   JOINT REPLACEMENT     BILATERAL KNEES    left shoulder arthroplasty     POLYPECTOMY  06/06/2020   Procedure: POLYPECTOMY;  Surgeon: Shellia Cleverly, DO;  Location: [facility];  Service: Gastroenterology;;   POLYPECTOMY  07/11/2020   Procedure: POLYPECTOMY;  Surgeon: Shellia Cleverly, DO;  Location: [facility];  Service: Gastroenterology;;   RADIOLOGY WITH ANESTHESIA N/A 10/15/2022   Procedure: IR WITH ANESTHESIA MICROWAVE ABLATION OF LIVER;  Surgeon: Sterling Big, MD;  Location: [facility];  Service: Radiology;  Laterality: N/A;   right knee replacement  2007   total knee replacement L  03-13-08   TUBAL LIGATION      Allergies: Amlodipine, Ace inhibitors, Benazepril hcl, and Codeine  Medications: Prior to Admission medications   Medication Sig Start Date End Date Taking? Authorizing Provider  aspirin EC 81 MG tablet Take 81 mg by mouth daily.    [provider]  BLACK COHOSH EXTRACT PO Take 1 tablet by mouth daily.    [provider]  cephALEXin (KEFLEX) 500 MG  capsule Take 1 capsule by mouth 4 times daily 08/26/23   Plotnikov, Georgina Quint, MD  Cholecalciferol 25 MCG (1000 UT) tablet Take 1,000 Units by mouth daily.     [provider]  furosemide (LASIX) 20 MG tablet TAKE 1 TO 2 TABLETS BY MOUTH ONCE DAILY 06/02/23   Plotnikov, Georgina Quint, MD  glucosamine-chondroitin 500-400 MG tablet Take 1 tablet by mouth daily.    [provider]  latanoprost (XALATAN) 0.005 % ophthalmic solution 1 drop at bedtime. 04/14/23   [provider]  loratadine (CLARITIN) 10 MG tablet Take 1 tablet (10 mg total) by mouth daily. 06/02/23   Plotnikov, Georgina Quint, MD  losartan (COZAAR) 100 MG tablet Take 1 tablet (100 mg total) by mouth daily. 06/02/23   Plotnikov, Georgina Quint, MD  meloxicam (MOBIC) 7.5 MG tablet TAKE 1 TO 2 TABLETS BY MOUTH ONCE DAILY AS NEEDED FOR PAIN 06/02/23   Plotnikov, Georgina Quint, MD  Multiple Vitamin (MULTIVITAMIN WITH MINERALS) TABS tablet Take 1 tablet by mouth daily.     [provider]  pantoprazole (PROTONIX) 40 MG tablet Take 1 tablet (40 mg total) by mouth daily. 06/02/23   Plotnikov, Georgina Quint, MD  potassium chloride SA (KLOR-CON M) 20 MEQ tablet Take 1 tablet (20 mEq total) by mouth daily. 06/02/23   Plotnikov, Georgina Quint, MD  silver sulfADIAZINE (SILVADENE) 1 % cream Apply 1 application topically daily. 09/09/21   Wallis Bamberg, PA-C  trolamine salicylate (ASPERCREME) 10 % cream Apply 1 Application topically as needed for muscle pain.    [provider]  vitamin C (ASCORBIC ACID) 500 MG tablet Take 500 mg by mouth daily.    [provider]  promethazine (PHENERGAN) 12.5 MG tablet Take 1 tablet (12.5 mg total) by mouth every 6 (six) hours as needed for nausea or vomiting. 08/09/19 09/27/20  Etta Grandchild, MD     Family History  Problem Relation Age of Onset   Hypertension Mother    Ovarian cancer Mother    Hypertension Father    Cancer Father 75       prostate cancer    Diabetes Father    Diabetes Other    Hypertension Other    Colon cancer Neg Hx    Esophageal cancer Neg Hx    Rectal cancer Neg Hx    Stomach cancer Neg Hx    Breast cancer Neg Hx     Social History   Socioeconomic History   Marital status: Single    Spouse name: Not on file   Number of children: 4   Years of education: Not on file   Highest education level: Not on file  Occupational History   Not on file  Tobacco Use   Smoking status: Former    Current packs/day: 0.00    Average packs/day: 0.3 packs/day for 20.0 years (5.0 ttl pk-yrs)    Types: Cigarettes    Start date: 12/30/1987    Quit date: 12/30/2007    Years since quitting: 15.7   Smokeless tobacco: Never  Vaping Use   Vaping status: Never Used  Substance and Sexual Activity   Alcohol use: No    Comment: 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, quit drinking alcohol in 10/2015    Drug use: No   Sexual activity: Not Currently    Birth control/protection: Post-menopausal  Other Topics  Concern   Not on file  Social History Narrative   Regular exercise: walk a few times a  week   Caffeine use: none   Single, raising her young great granddaughter and watches grandchildren after school   Has a room mate to help prn   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 [employer]   Independent/drives   Social Determinants of Health   Financial Resource Strain: Low Risk  (06/01/2023)   Overall Financial Resource Strain (CARDIA)    Difficulty of Paying Living Expenses: Not hard at all  Food Insecurity: No Food Insecurity (06/01/2023)   Hunger Vital Sign    Worried About Running Out of Food in the Last Year: Never true    Ran Out of Food in the Last Year: Never true  Transportation Needs: No Transportation Needs (06/01/2023)   PRAPARE - Administrator, Civil Service (Medical): No    Lack of Transportation (Non-Medical): No  Physical Activity: Insufficiently Active (06/01/2023)   Exercise Vital Sign    Days of Exercise per Week: 1 day    Minutes of Exercise per Session: 10 min  Stress: No Stress Concern Present (06/01/2023)   [employer] of Occupational Health - Occupational Stress Questionnaire    Feeling of Stress : Not at all  Social Connections: Unknown (06/01/2023)   Social Connection and Isolation Panel [NHANES]    Frequency of Communication with Friends and Family: More than three times a week    Frequency of Social Gatherings with Friends and Family: More than three times a week    Attends Religious Services: Patient declined    Database administrator or Organizations: No    Attends Banker Meetings: Never    Marital Status: Divorced    ECOG Status: 0 - Asymptomatic  Review of Systems  Review of Systems: A 12 point ROS discussed and pertinent positives are indicated in the HPI above.  All other systems are negative.  Advance Care Plan: The advanced care plan/surrogate decision maker was discussed at the time of visit and the patient did not  wish to discuss or was not able to name a surrogate decision maker or provide an advance care plan.    Physical Exam No direct physical exam was performed (except for noted visual exam findings with Video Visits).    Vital Signs: LMP  (LMP Unknown)   Imaging: IR Radiologist Eval & Mgmt  Result Date: 09/24/2023 EXAM: ESTABLISHED PATIENT OFFICE VISIT CHIEF COMPLAINT: SEE EPIC NOTE HISTORY OF PRESENT ILLNESS: SEE EPIC NOTE REVIEW OF SYSTEMS: SEE EPIC NOTE PHYSICAL EXAMINATION: SEE EPIC NOTE ASSESSMENT AND PLAN: SEE EPIC NOTE Electronically Signed   By: Malachy Moan M.D.   On: 09/24/2023 12:11   MR ABDOMEN WWO CONTRAST  Result Date: 09/18/2023 CLINICAL DATA:  Chemoembolization and microwave ablation of prior hepatocellular carcinoma. Chronic liver disease. EXAM: MRI ABDOMEN WITHOUT AND WITH CONTRAST TECHNIQUE: Multiplanar multisequence MR imaging of the abdomen was performed both before and after the administration of intravenous contrast. CONTRAST:  10 cc Vueway COMPARISON:  02/24/2023 FINDINGS: Lower chest: Unremarkable Hepatobiliary: Ablation lesion at the junction of segment 4 and 8, and ablation lesion at the junction of segments 7 and 8 noted with no findings of recurrence. No new hepatic lesion observed. No biliary dilatation or venous thrombus. Pancreas:  Unremarkable Spleen:  Unremarkable Adrenals/Urinary Tract:  Unremarkable Stomach/Bowel: Descending colon diverticulosis. Vascular/Lymphatic:  Unremarkable Other:  No supplemental non-categorized findings. Musculoskeletal: Lumbar spondylosis and degenerative disc disease. Thoracic spondylosis. IMPRESSION: 1. No findings of recurrent hepatocellular carcinoma. Two ablation sites noted. 2. Descending colon diverticulosis. 3. Lumbar  spondylosis and degenerative disc disease. Electronically Signed   By: Gaylyn Rong M.D.   On: 09/18/2023 13:04    Labs:  CBC: Recent Labs    10/15/22 0700 10/16/22 0438 10/27/22 1249 06/02/23 0909   WBC 3.7* 6.5 4.1 3.7*  HGB 11.8* 11.3* 11.8 11.8*  HCT 40.4 37.2 37.3 37.4  PLT 182 177 289 183.0    COAGS: Recent Labs    10/08/22 1430 10/15/22 0700 10/27/22 1249  INR 1.0 1.0 1.0    BMP: Recent Labs    10/08/22 1430 10/15/22 0700 10/16/22 0438 10/27/22 1249 06/02/23 0909  NA 142 141 141 142 141  K 3.8 5.5* 4.1 4.5 3.9  CL 110 112* 113* 108 108  CO2 25 24 24 22 25   GLUCOSE 95 81 148* 96 99  BUN 15 15 15 13 17   CALCIUM 9.2 8.8* 8.4* 9.2 9.2  CREATININE 0.94 1.05* 0.97 0.93 0.95  GFRNONAA >60 56* >60  --   --     LIVER FUNCTION TESTS: Recent Labs    10/08/22 1430 10/15/22 0700 10/16/22 0438 10/27/22 1249 06/02/23 0909  BILITOT 0.7 1.2 0.4 0.4 0.3  AST 18 28 160* 17 14  ALT 12 8 138* 29 10  ALKPHOS 78 86 66  --  83  PROT 7.6 7.2 6.2* 7.1 7.1  ALBUMIN 4.0 3.7 3.2*  --  3.9    TUMOR MARKERS: No results for input(s): "AFPTM", "CEA", "CA199", "CHROMGRNA" in the last 8760 hours.  Assessment and Plan:  Mrs. Ribelin is doing extremely well 11 months status post combined DEB-TACE and percutaneous microwave ablation of hepatocellular carcinoma.  Her most recent MR imaging demonstrates a complete response to therapy.  No evidence of residual or new disease.  Clinically, she is doing extremely well.  We will continue imaging surveillance per protocol.   1.)  MRI of the abdomen with gadolinium contrast in 6 months followed by clinic visit.       Electronically Signed: Sterling Big 09/24/2023, 12:14 PM   I spent a total of 15 Minutes in remote  clinical consultation, greater than 50% of which was counseling/coordinating care for hepatocellular cancer.    Visit type: Audio only (telephone). Audio (no video) only due to patient preference. Alternative for in-person consultation at [facility], [address] [place], [place], [place]. This visit type was conducted due to national recommendations for restrictions regarding the COVID-19 Pandemic (e.g.  social distancing).  This format is felt to be most appropriate for this patient at this time.  All issues noted in this document were discussed and addressed.

## 2023-09-28 DIAGNOSIS — H401131 Primary open-angle glaucoma, bilateral, mild stage: Secondary | ICD-10-CM | POA: Diagnosis not present

## 2023-09-28 DIAGNOSIS — H25013 Cortical age-related cataract, bilateral: Secondary | ICD-10-CM | POA: Diagnosis not present

## 2023-11-27 ENCOUNTER — Other Ambulatory Visit: Payer: Self-pay | Admitting: Internal Medicine

## 2023-12-07 ENCOUNTER — Ambulatory Visit (INDEPENDENT_AMBULATORY_CARE_PROVIDER_SITE_OTHER): Payer: Medicare Other | Admitting: Internal Medicine

## 2023-12-07 ENCOUNTER — Encounter: Payer: Self-pay | Admitting: Internal Medicine

## 2023-12-07 VITALS — BP 138/82 | HR 88 | Temp 97.8°F | Ht 60.0 in | Wt 298.0 lb

## 2023-12-07 DIAGNOSIS — I10 Essential (primary) hypertension: Secondary | ICD-10-CM

## 2023-12-07 DIAGNOSIS — Z0000 Encounter for general adult medical examination without abnormal findings: Secondary | ICD-10-CM | POA: Diagnosis not present

## 2023-12-07 DIAGNOSIS — M1991 Primary osteoarthritis, unspecified site: Secondary | ICD-10-CM | POA: Diagnosis not present

## 2023-12-07 LAB — CBC WITH DIFFERENTIAL/PLATELET
Basophils Absolute: 0 10*3/uL (ref 0.0–0.1)
Basophils Relative: 0.6 % (ref 0.0–3.0)
Eosinophils Absolute: 0.1 10*3/uL (ref 0.0–0.7)
Eosinophils Relative: 4.1 % (ref 0.0–5.0)
HCT: 39.4 % (ref 36.0–46.0)
Hemoglobin: 12.6 g/dL (ref 12.0–15.0)
Lymphocytes Relative: 33.2 % (ref 12.0–46.0)
Lymphs Abs: 1.1 10*3/uL (ref 0.7–4.0)
MCHC: 32.1 g/dL (ref 30.0–36.0)
MCV: 85.2 fL (ref 78.0–100.0)
Monocytes Absolute: 0.4 10*3/uL (ref 0.1–1.0)
Monocytes Relative: 13.5 % — ABNORMAL HIGH (ref 3.0–12.0)
Neutro Abs: 1.6 10*3/uL (ref 1.4–7.7)
Neutrophils Relative %: 48.6 % (ref 43.0–77.0)
Platelets: 186 10*3/uL (ref 150.0–400.0)
RBC: 4.63 Mil/uL (ref 3.87–5.11)
RDW: 14.2 % (ref 11.5–15.5)
WBC: 3.3 10*3/uL — ABNORMAL LOW (ref 4.0–10.5)

## 2023-12-07 LAB — URINALYSIS
Bilirubin Urine: NEGATIVE
Hgb urine dipstick: NEGATIVE
Ketones, ur: NEGATIVE
Leukocytes,Ua: NEGATIVE
Nitrite: NEGATIVE
Specific Gravity, Urine: 1.02 (ref 1.000–1.030)
Urine Glucose: NEGATIVE
Urobilinogen, UA: 0.2 (ref 0.0–1.0)
pH: 6 (ref 5.0–8.0)

## 2023-12-07 LAB — COMPREHENSIVE METABOLIC PANEL
ALT: 11 U/L (ref 0–35)
AST: 17 U/L (ref 0–37)
Albumin: 4.1 g/dL (ref 3.5–5.2)
Alkaline Phosphatase: 89 U/L (ref 39–117)
BUN: 14 mg/dL (ref 6–23)
CO2: 25 meq/L (ref 19–32)
Calcium: 9.3 mg/dL (ref 8.4–10.5)
Chloride: 105 meq/L (ref 96–112)
Creatinine, Ser: 0.9 mg/dL (ref 0.40–1.20)
GFR: 63.13 mL/min (ref >60.00)
Glucose, Bld: 108 mg/dL — ABNORMAL HIGH (ref 70–99)
Potassium: 4.2 meq/L (ref 3.5–5.1)
Sodium: 140 meq/L (ref 135–145)
Total Bilirubin: 0.4 mg/dL (ref 0.2–1.2)
Total Protein: 7.3 g/dL (ref 6.0–8.3)

## 2023-12-07 LAB — LIPID PANEL
Cholesterol: 164 mg/dL (ref 0–200)
HDL: 42.7 mg/dL (ref >39.00)
LDL Cholesterol: 101 mg/dL — ABNORMAL HIGH (ref 0–99)
NonHDL: 121.01
Total CHOL/HDL Ratio: 4
Triglycerides: 101 mg/dL (ref 0.0–149.0)
VLDL: 20.2 mg/dL (ref 0.0–40.0)

## 2023-12-07 LAB — TSH: TSH: 2.57 u[IU]/mL (ref 0.35–5.50)

## 2023-12-07 NOTE — Assessment & Plan Note (Signed)
Chronic Cont on Losartan, Norvasc, Lasix Check labs -- see orders

## 2023-12-07 NOTE — Assessment & Plan Note (Signed)
Continue with current prescription therapy as reflected on the Med list.  

## 2023-12-07 NOTE — Assessment & Plan Note (Addendum)
Worse again Discussed

## 2023-12-07 NOTE — Progress Notes (Signed)
Subjective:  Patient ID: Samantha [name], female    DOB: November 04, 1951  Age: 72 y.o. MRN: [id number]  CC: Annual Exam (fasting)   HPI Samantha [name] presents for a well exam Working 7 d/wk, she loves working  Outpatient Medications Prior to Visit  Medication Sig Dispense Refill   aspirin EC 81 MG tablet Take 81 mg by mouth daily.     BLACK COHOSH EXTRACT PO Take 1 tablet by mouth daily.     cephALEXin (KEFLEX) 500 MG capsule Take 1 capsule by mouth 4 times daily 12 capsule 0   Cholecalciferol 25 MCG (1000 UT) tablet Take 1,000 Units by mouth daily.      furosemide (LASIX) 20 MG tablet TAKE 1 TO 2 TABLETS BY MOUTH ONCE DAILY 180 tablet 3   glucosamine-chondroitin 500-400 MG tablet Take 1 tablet by mouth daily.     latanoprost (XALATAN) 0.005 % ophthalmic solution 1 drop at bedtime.     loratadine (CLARITIN) 10 MG tablet Take 1 tablet (10 mg total) by mouth daily. 90 tablet 3   losartan (COZAAR) 100 MG tablet Take 1 tablet (100 mg total) by mouth daily. 90 tablet 3   meloxicam (MOBIC) 7.5 MG tablet TAKE 1 TO 2 TABLETS BY MOUTH ONCE DAILY AS NEEDED FOR PAIN 180 tablet 0   Multiple Vitamin (MULTIVITAMIN WITH MINERALS) TABS tablet Take 1 tablet by mouth daily.      pantoprazole (PROTONIX) 40 MG tablet Take 1 tablet (40 mg total) by mouth daily. 90 tablet 3   potassium chloride SA (KLOR-CON M) 20 MEQ tablet Take 1 tablet (20 mEq total) by mouth daily. 90 tablet 3   silver sulfADIAZINE (SILVADENE) 1 % cream Apply 1 application topically daily. 100 g 0   trolamine salicylate (ASPERCREME) 10 % cream Apply 1 Application topically as needed for muscle pain.     vitamin C (ASCORBIC ACID) 500 MG tablet Take 500 mg by mouth daily.     No facility-administered medications prior to visit.    ROS: Review of Systems  Constitutional:  Positive for unexpected weight change. Negative for activity change, appetite change, chills, diaphoresis, fatigue and fever.  HENT:  Negative for congestion,  dental problem, ear pain, hearing loss, mouth sores, postnasal drip, sinus pressure, sneezing, sore throat and voice change.   Eyes:  Negative for pain and visual disturbance.  Respiratory:  Negative for cough, chest tightness, wheezing and stridor.   Cardiovascular:  Negative for chest pain, palpitations and leg swelling.  Gastrointestinal:  Negative for abdominal distention, abdominal pain, blood in stool, nausea, rectal pain and vomiting.  Genitourinary:  Negative for decreased urine volume, difficulty urinating, dysuria, frequency, hematuria, menstrual problem, vaginal bleeding, vaginal discharge and vaginal pain.  Musculoskeletal:  Positive for arthralgias. Negative for back pain, gait problem, joint swelling and neck pain.  Skin:  Negative for color change, rash and wound.  Neurological:  Negative for dizziness, tremors, syncope, speech difficulty, weakness and light-headedness.  Hematological:  Negative for adenopathy.  Psychiatric/Behavioral:  Negative for behavioral problems, confusion, decreased concentration, dysphoric mood, hallucinations, sleep disturbance and suicidal ideas. The patient is not nervous/anxious and is not hyperactive.     Objective:  BP 138/82 (BP Location: Left Arm, Patient Position: Sitting, Cuff Size: Large)   Pulse 88   Temp 97.8 F (36.6 C) (Temporal)   Ht 5' (1.524 m)   Wt 298 lb (135.2 kg)   LMP  (LMP Unknown)   SpO2 99%   BMI 58.20 kg/m  BP Readings from Last 3 Encounters:  12/07/23 138/82  06/02/23 118/70  02/03/23 122/70    Wt Readings from Last 3 Encounters:  12/07/23 298 lb (135.2 kg)  06/02/23 290 lb (131.5 kg)  02/03/23 294 lb 9.6 oz (133.6 kg)    Physical Exam Constitutional:      General: She is not in acute distress.    Appearance: She is well-developed. She is obese. She is not toxic-appearing.  HENT:     Head: Normocephalic.     Right Ear: External ear normal.     Left Ear: External ear normal.     Nose: Nose normal.   Eyes:     General:        Right eye: No discharge.        Left eye: No discharge.     Conjunctiva/sclera: Conjunctivae normal.     Pupils: Pupils are equal, round, and reactive to light.  Neck:     Thyroid: No thyromegaly.     Vascular: No JVD.     Trachea: No tracheal deviation.  Cardiovascular:     Rate and Rhythm: Normal rate and regular rhythm.     Heart sounds: Normal heart sounds.  Pulmonary:     Effort: No respiratory distress.     Breath sounds: No stridor. No wheezing.  Abdominal:     General: Bowel sounds are normal. There is no distension.     Palpations: Abdomen is soft. There is no mass.     Tenderness: There is no abdominal tenderness. There is no guarding or rebound.  Musculoskeletal:        General: No tenderness.     Cervical back: Normal range of motion and neck supple. No rigidity.  Lymphadenopathy:     Cervical: No cervical adenopathy.  Skin:    Findings: No erythema or rash.  Neurological:     Cranial Nerves: No cranial nerve deficit.     Motor: No abnormal muscle tone.     Coordination: Coordination normal.     Deep Tendon Reflexes: Reflexes normal.  Psychiatric:        Behavior: Behavior normal.        Thought Content: Thought content normal.        Judgment: Judgment normal.     Lab Results  Component Value Date   WBC 3.7 (L) 06/02/2023   HGB 11.8 (L) 06/02/2023   HCT 37.4 06/02/2023   PLT 183.0 06/02/2023   GLUCOSE 99 06/02/2023   CHOL 152 06/02/2023   TRIG 95.0 06/02/2023   HDL 42.30 06/02/2023   LDLCALC 91 06/02/2023   ALT 10 06/02/2023   AST 14 06/02/2023   NA 141 06/02/2023   K 3.9 06/02/2023   CL 108 06/02/2023   CREATININE 0.95 06/02/2023   BUN 17 06/02/2023   CO2 25 06/02/2023   TSH 2.35 06/02/2023   INR 1.0 10/27/2022   HGBA1C 6.1 12/31/2021    IR Radiologist Eval & Mgmt  Result Date: 09/24/2023 EXAM: ESTABLISHED PATIENT OFFICE VISIT CHIEF COMPLAINT: SEE EPIC NOTE HISTORY OF PRESENT ILLNESS: SEE EPIC NOTE REVIEW OF  SYSTEMS: SEE EPIC NOTE PHYSICAL EXAMINATION: SEE EPIC NOTE ASSESSMENT AND PLAN: SEE EPIC NOTE Electronically Signed   By: Malachy Moan M.D.   On: 09/24/2023 12:11    Assessment & Plan:   Problem List Items Addressed This Visit     OBESITY, MORBID    Worse again Discussed      Essential hypertension    Chronic Cont on Losartan,  Norvasc, Lasix Check labs -- see orders      Osteoarthritis    Continue with current prescription therapy as reflected on the Med list.       Well adult exam - Primary    Here for medicare wellness/physical   We discussed age appropriate health related issues, including available/recomended screening tests and vaccinations. Labs were ordered to be later reviewed . All questions were answered. We discussed one or more of the following - seat belt use, use of sunscreen/sun exposure exercise, fall risk reduction, second hand smoke exposure, firearm use and storage, seat belt use, a need for adhering to healthy diet and exercise. Labs were ordered.  All questions were answered. tDAP suggested         No orders of the defined types were placed in this encounter.     Follow-up: No follow-ups on file.  Sonda Primes, MD

## 2023-12-07 NOTE — Assessment & Plan Note (Signed)
Here for medicare wellness/physical   We discussed age appropriate health related issues, including available/recomended screening tests and vaccinations. Labs were ordered to be later reviewed . All questions were answered. We discussed one or more of the following - seat belt use, use of sunscreen/sun exposure exercise, fall risk reduction, second hand smoke exposure, firearm use and storage, seat belt use, a need for adhering to healthy diet and exercise. Labs were ordered.  All questions were answered. tDAP suggested

## 2024-01-26 DIAGNOSIS — H2513 Age-related nuclear cataract, bilateral: Secondary | ICD-10-CM | POA: Diagnosis not present

## 2024-01-26 DIAGNOSIS — H524 Presbyopia: Secondary | ICD-10-CM | POA: Diagnosis not present

## 2024-01-26 DIAGNOSIS — H401131 Primary open-angle glaucoma, bilateral, mild stage: Secondary | ICD-10-CM | POA: Diagnosis not present

## 2024-02-09 DIAGNOSIS — H401131 Primary open-angle glaucoma, bilateral, mild stage: Secondary | ICD-10-CM | POA: Diagnosis not present

## 2024-02-09 DIAGNOSIS — H25013 Cortical age-related cataract, bilateral: Secondary | ICD-10-CM | POA: Diagnosis not present

## 2024-03-02 DIAGNOSIS — H25812 Combined forms of age-related cataract, left eye: Secondary | ICD-10-CM | POA: Diagnosis not present

## 2024-03-02 DIAGNOSIS — H21562 Pupillary abnormality, left eye: Secondary | ICD-10-CM | POA: Diagnosis not present

## 2024-03-02 DIAGNOSIS — H25012 Cortical age-related cataract, left eye: Secondary | ICD-10-CM | POA: Diagnosis not present

## 2024-03-02 DIAGNOSIS — H2589 Other age-related cataract: Secondary | ICD-10-CM | POA: Diagnosis not present

## 2024-03-02 DIAGNOSIS — H2512 Age-related nuclear cataract, left eye: Secondary | ICD-10-CM | POA: Diagnosis not present

## 2024-03-02 DIAGNOSIS — H269 Unspecified cataract: Secondary | ICD-10-CM

## 2024-03-02 HISTORY — DX: Unspecified cataract: H26.9

## 2024-03-07 ENCOUNTER — Ambulatory Visit (INDEPENDENT_AMBULATORY_CARE_PROVIDER_SITE_OTHER)

## 2024-03-07 VITALS — Ht 60.0 in | Wt 298.0 lb

## 2024-03-07 DIAGNOSIS — Z780 Asymptomatic menopausal state: Secondary | ICD-10-CM | POA: Diagnosis not present

## 2024-03-07 DIAGNOSIS — Z0000 Encounter for general adult medical examination without abnormal findings: Secondary | ICD-10-CM | POA: Diagnosis not present

## 2024-03-07 NOTE — Progress Notes (Cosign Needed Addendum)
 Subjective:   Samantha [name] is a 73 y.o. who presents for a Medicare Wellness preventive visit.  Visit Complete: Virtual I connected with  Nickolas Madrid on 03/07/24 by a audio enabled telemedicine application and verified that I am speaking with the correct person using two identifiers.  Patient Location: Home  Provider Location: Home Office  I discussed the limitations of evaluation and management by telemedicine. The patient expressed understanding and agreed to proceed.  Vital Signs: Because this visit was a virtual/telehealth visit, some criteria may be missing or patient reported. Any vitals not documented were not able to be obtained and vitals that have been documented are patient reported.  VideoDeclined- This patient declined Librarian, academic. Therefore the visit was completed with audio only.  AWV Questionnaire: No: Patient Medicare AWV questionnaire was not completed prior to this visit.  Cardiac Risk Factors include: advanced age (>3men, >57 women);hypertension;dyslipidemia;Other (see comment)     Objective:    Today's Vitals   03/07/24 1011  Weight: 298 lb (135.2 kg)  Height: 5' (1.524 m)   Body mass index is 58.2 kg/m.     03/07/2024   10:18 AM 02/03/2023    9:54 AM 10/15/2022    6:54 AM 10/08/2022    2:07 PM 01/17/2022    9:54 AM 04/20/2021    7:03 PM 11/27/2020    9:02 AM  Advanced Directives  Does Patient Have a Medical Advance Directive? Yes Yes Yes Yes Yes No Yes  Type of Estate agent of [place];Living will Healthcare Power of [place];Living will Healthcare Power of [place];Living will Healthcare Power of [place];Living will Healthcare Power of [place];Living will  Healthcare Power of [place];Living will  Does patient want to make changes to medical advance directive?   No - Patient declined    No - Patient declined  Copy of Healthcare Power of Attorney in Chart? No - copy requested  No - copy requested No - copy requested No - copy requested No - copy requested  No - copy requested    Current Medications (verified) Outpatient Encounter Medications as of 03/07/2024  Medication Sig   aspirin EC 81 MG tablet Take 81 mg by mouth daily.   BLACK COHOSH EXTRACT PO Take 1 tablet by mouth daily.   cephALEXin (KEFLEX) 500 MG capsule Take 1 capsule by mouth 4 times daily   Cholecalciferol 25 MCG (1000 UT) tablet Take 1,000 Units by mouth daily.    furosemide (LASIX) 20 MG tablet TAKE 1 TO 2 TABLETS BY MOUTH ONCE DAILY   glucosamine-chondroitin 500-400 MG tablet Take 1 tablet by mouth daily.   latanoprost (XALATAN) 0.005 % ophthalmic solution 1 drop at bedtime.   loratadine (CLARITIN) 10 MG tablet Take 1 tablet (10 mg total) by mouth daily.   losartan (COZAAR) 100 MG tablet Take 1 tablet (100 mg total) by mouth daily.   meloxicam (MOBIC) 7.5 MG tablet TAKE 1 TO 2 TABLETS BY MOUTH ONCE DAILY AS NEEDED FOR PAIN   Multiple Vitamin (MULTIVITAMIN WITH MINERALS) TABS tablet Take 1 tablet by mouth daily.    pantoprazole (PROTONIX) 40 MG tablet Take 1 tablet (40 mg total) by mouth daily.   potassium chloride SA (KLOR-CON M) 20 MEQ tablet Take 1 tablet (20 mEq total) by mouth daily.   silver sulfADIAZINE (SILVADENE) 1 % cream Apply 1 application topically daily.   trolamine salicylate (ASPERCREME) 10 % cream Apply 1 Application topically as needed for muscle pain.   vitamin C (ASCORBIC  ACID) 500 MG tablet Take 500 mg by mouth daily.   [DISCONTINUED] promethazine (PHENERGAN) 12.5 MG tablet Take 1 tablet (12.5 mg total) by mouth every 6 (six) hours as needed for nausea or vomiting.   No facility-administered encounter medications on file as of 03/07/2024.    Allergies (verified) Amlodipine, Ace inhibitors, Benazepril hcl, and Codeine   History: Past Medical History:  Diagnosis Date   Allergy    seasonal   Anemia    Cataract 03/02/2024   bilateral - MD just watching   Cirrhosis  (HCC) 2018   Diverticulosis    Edema    Elevated glucose    no longer an issue 03-18-16   Fatty liver 2017   GERD (gastroesophageal reflux disease)    Glaucoma    H/O seasonal allergies    Hepatitis C    hepatitis c completed  tx march 2017   Hyperlipidemia 2011   Hypertension    LBP (low back pain)    Liver cancer (HCC) 2017   IN REMISSION. WHEN IDAGNONED SIZE OF GRAPEFRUIT    Morbid obesity (HCC)    Osteoarthritis    B knees   Right middle lobe pulmonary nodule    resolved 2018   Uterine fibroid    Wears glasses    Past Surgical History:  Procedure Laterality Date   BIOPSY  07/04/2019   Procedure: BIOPSY;  Surgeon: Beverley Fiedler, MD;  Location: [facility];  Service: Gastroenterology;;   BIOPSY  07/11/2020   Procedure: BIOPSY;  Surgeon: Shellia Cleverly, DO;  Location: [facility];  Service: Gastroenterology;;   CHOLECYSTECTOMY     COLONOSCOPY N/A 01/09/2015   Procedure: COLONOSCOPY;  Surgeon: Beverley Fiedler, MD;  [facility]: [facility];  Service: Gastroenterology;  Laterality: N/A;   COLONOSCOPY WITH PROPOFOL N/A 06/06/2020   Procedure: COLONOSCOPY WITH PROPOFOL;  Surgeon: Shellia Cleverly, DO;  [facility];  Service: Gastroenterology;  Laterality: N/A;   COLONOSCOPY WITH PROPOFOL N/A 07/11/2020   Procedure: COLONOSCOPY WITH PROPOFOL;  Surgeon: Shellia Cleverly, DO;  [facility];  Service: Gastroenterology;  Laterality: N/A;   ESOPHAGOGASTRODUODENOSCOPY (EGD) WITH PROPOFOL N/A 06/10/2016   Procedure: ESOPHAGOGASTRODUODENOSCOPY (EGD) WITH PROPOFOL;  Surgeon: Beverley Fiedler, MD;  [facility];  Service: Gastroenterology;  Laterality: N/A;   ESOPHAGOGASTRODUODENOSCOPY (EGD) WITH PROPOFOL N/A 07/04/2019   Procedure: ESOPHAGOGASTRODUODENOSCOPY (EGD) WITH PROPOFOL;  Surgeon: Beverley Fiedler, MD;  [facility];  Service: Gastroenterology;  Laterality: N/A;   HYSTEROSCOPY WITH D & C N/A 10/15/2018   Procedure: DILATATION AND CURETTAGE  /HYSTEROSCOPY;  Surgeon: Noland Fordyce, MD;  Location: [facility];  Service: Gynecology;  Laterality: N/A;   IR 3D INDEPENDENT WKST  10/15/2022   IR 3D INDEPENDENT WKST  10/15/2022   IR ANGIOGRAM SELECTIVE EACH ADDITIONAL VESSEL  10/15/2022   IR ANGIOGRAM SELECTIVE EACH ADDITIONAL VESSEL  10/15/2022   IR ANGIOGRAM SELECTIVE EACH ADDITIONAL VESSEL  10/15/2022   IR ANGIOGRAM SELECTIVE EACH ADDITIONAL VESSEL  10/15/2022   IR ANGIOGRAM VISCERAL SELECTIVE  10/15/2022   IR EMBO TUMOR ORGAN ISCHEMIA INFARCT INC GUIDE ROADMAPPING  10/15/2022   IR GENERIC HISTORICAL  07/03/2016   IR RADIOLOGIST EVAL & MGMT 07/03/2016 Malachy Moan, MD [facility]   IR GENERIC HISTORICAL  11/18/2016   IR RADIOLOGIST EVAL & MGMT 11/18/2016 Malachy Moan, MD [facility]   IR RADIOLOGIST EVAL & MGMT  04/09/2017   IR RADIOLOGIST EVAL & MGMT  11/05/2017   IR RADIOLOGIST EVAL & MGMT  03/13/2020  IR RADIOLOGIST EVAL & MGMT  08/30/2020   IR RADIOLOGIST EVAL & MGMT  04/10/2021   IR RADIOLOGIST EVAL & MGMT  06/04/2022   IR RADIOLOGIST EVAL & MGMT  09/23/2022   IR RADIOLOGIST EVAL & MGMT  11/03/2022   IR RADIOLOGIST EVAL & MGMT  03/02/2023   IR RADIOLOGIST EVAL & MGMT  09/24/2023   IR US GUIDE VASC ACCESS RIGHT  10/15/2022   JOINT REPLACEMENT     BILATERAL KNEES   left shoulder arthroplasty     POLYPECTOMY  06/06/2020   Procedure: POLYPECTOMY;  Surgeon: Shellia Cleverly, DO;  [facility];  Service: Gastroenterology;;   POLYPECTOMY  07/11/2020   Procedure: POLYPECTOMY;  Surgeon: Shellia Cleverly, DO;  [facility];  Service: Gastroenterology;;   RADIOLOGY WITH ANESTHESIA N/A 10/15/2022   Procedure: IR WITH ANESTHESIA MICROWAVE ABLATION OF LIVER;  Surgeon: Sterling Big, MD;  Location: [facility];  Service: Radiology;  Laterality: N/A;   right knee replacement  2007   total knee replacement L  03/13/2008   TUBAL LIGATION     Family History  Problem Relation Age of Onset    Hypertension Mother    Ovarian cancer Mother    Hypertension Father    Cancer Father 34       prostate cancer    Diabetes Father    Diabetes Other    Hypertension Other    Colon cancer Neg Hx    Esophageal cancer Neg Hx    Rectal cancer Neg Hx    Stomach cancer Neg Hx    Breast cancer Neg Hx    Social History   Socioeconomic History   Marital status: Single    Spouse name: Not on file   Number of children: 4   Years of education: Not on file   Highest education level: Not on file  Occupational History   Occupation: RETIRED  Tobacco Use   Smoking status: Former    Current packs/day: 0.00    Average packs/day: 0.3 packs/day for 20.0 years (5.0 ttl pk-yrs)    Types: Cigarettes    Start date: 12/30/1987    Quit date: 12/30/2007    Years since quitting: 16.1   Smokeless tobacco: Never  Vaping Use   Vaping status: Never Used  Substance and Sexual Activity   Alcohol use: No    Comment: 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, quit drinking alcohol in 10/2015    Drug use: No   Sexual activity: Not Currently    Birth control/protection: Post-menopausal  Other Topics Concern   Not on file  Social History Narrative   Regular exercise: walk a few times a week   Caffeine use: none   Single, raising her young great granddaughter and watches grandchildren after school   Has a room mate to help prn   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 [employer]   Independent/drives   Social Drivers of Health   Financial Resource Strain: Medium Risk (03/07/2024)   Overall Financial Resource Strain (CARDIA)    Difficulty of Paying Living Expenses: Somewhat hard  Food Insecurity: No Food Insecurity (03/07/2024)   Hunger Vital Sign    Worried About Running Out of Food in the Last Year: Never true    Ran Out of Food in the Last Year: Never true  Transportation Needs: No Transportation Needs (03/07/2024)   PRAPARE - Administrator, Civil Service (Medical): No    Lack  of Transportation (  Non-Medical): No  Physical Activity: Sufficiently Active (03/07/2024)   Exercise Vital Sign    Days of Exercise per Week: 7 days    Minutes of Exercise per Session: 150+ min  Stress: No Stress Concern Present (03/07/2024)   [employer] of Occupational Health - Occupational Stress Questionnaire    Feeling of Stress : Not at all  Social Connections: Moderately Isolated (03/07/2024)   Social Connection and Isolation Panel [NHANES]    Frequency of Communication with Friends and Family: Never    Frequency of Social Gatherings with Friends and Family: Once a week    Attends Religious Services: More than 4 times per year    Active Member of [employer] or Organizations: Yes    Attends Banker Meetings: Never    Marital Status: Divorced    Tobacco Counseling Counseling given: Not Answered    Clinical Intake:  Pre-visit preparation completed: Yes  Pain : No/denies pain     BMI - recorded: 58.2 Nutritional Status: BMI > 30  Obese Nutritional Risks: None Diabetes: No  How often do you need to have someone help you when you read instructions, pamphlets, or other written materials from your doctor or pharmacy?: 1 - Never  Interpreter Needed?: No  Information entered by :: Nakayla Rorabaugh, RMA   Activities of Daily Living     03/07/2024   10:14 AM  In your present state of health, do you have any difficulty performing the following activities:  Hearing? 0  Vision? 0  Difficulty concentrating or making decisions? 0  Walking or climbing stairs? 0  Dressing or bathing? 0  Doing errands, shopping? 0  Preparing Food and eating ? N  Using the Toilet? N  In the past six months, have you accidently leaked urine? N  Do you have problems with loss of bowel control? N  Managing your Medications? N  Managing your Finances? N  Housekeeping or managing your Housekeeping? N    Patient Care Team: Plotnikov, Georgina Quint, MD as PCP - General Comer, Belia Heman,  MD as Consulting Physician (Infectious Diseases) Almond Lint, MD as Consulting Physician (General Surgery) Pyrtle, Carie Caddy, MD as Consulting Physician (Gastroenterology) Malachy Mood, MD as Consulting Physician (Hematology) Noland Fordyce, MD as Consulting Physician (Obstetrics and Gynecology) Sinda Du, MD as Consulting Physician (Ophthalmology) Sterling Big, MD as Consulting Physician (Interventional Radiology)  Indicate any recent Medical Services you may have received from other than [facility] providers in the past year (date may be approximate).     Assessment:   This is a routine wellness examination for Samantha [name].  Hearing/Vision screen Hearing Screening - Comments:: Denies hearing difficulties   Vision Screening - Comments:: Wears eyeglasses   Goals Addressed               This Visit's Progress     My goal for 2024 is to lose 60 pounds. (pt-stated)        Still working toward that goal.       Depression Screen     03/07/2024   10:23 AM 06/02/2023    8:22 AM 02/03/2023    9:46 AM 12/01/2022    8:21 AM 05/28/2022    8:42 AM 01/17/2022    9:55 AM 01/17/2022    9:53 AM  PHQ 2/9 Scores  PHQ - 2 Score 0 0 0 0 0 0 0  PHQ- 9 Score 0          Fall Risk     03/07/2024  10:19 AM 06/02/2023    8:22 AM 02/03/2023    9:55 AM 12/01/2022    8:21 AM 05/28/2022    8:42 AM  Fall Risk   Falls in the past year? 0 0 0 0 0  Number falls in past yr: 0 0 0 0 0  Injury with Fall? 0 0 0 0 0  Risk for fall due to : No Fall Risks No Fall Risks No Fall Risks No Fall Risks No Fall Risks  Follow up Falls prevention discussed;Falls evaluation completed Falls evaluation completed Falls prevention discussed Falls evaluation completed Falls evaluation completed    MEDICARE RISK AT HOME:  Medicare Risk at Home Any stairs in or around the home?: No Home free of loose throw rugs in walkways, pet beds, electrical cords, etc?: Yes Adequate lighting in your home to reduce risk of falls?:  Yes Life alert?: No Use of a cane, walker or w/c?: No Grab bars in the bathroom?: No Shower chair or bench in shower?: Yes Elevated toilet seat or a handicapped toilet?: No  TIMED UP AND GO:  Was the test performed?  No  Cognitive Function: 6CIT completed    11/11/2017   11:12 AM  MMSE - Mini Mental State Exam  Orientation to time 5  Orientation to Place 5  Registration 3  Attention/ Calculation 5  Recall 2  Language- name 2 objects 2  Language- repeat 1  Language- follow 3 step command 3  Language- read & follow direction 1  Write a sentence 1  Copy design 1  Total score 29        03/07/2024   10:19 AM 02/03/2023    9:47 AM 11/27/2020    9:04 AM  6CIT Screen  What Year? 0 points 0 points 0 points  What month? 0 points 0 points 0 points  What time? 0 points 0 points 0 points  Count back from 20 0 points 0 points 0 points  Months in reverse 0 points 0 points 0 points  Repeat phrase 0 points 0 points 0 points  Total Score 0 points 0 points 0 points    Immunizations Immunization History  Administered Date(s) Administered   Fluad Quad(high Dose 65+) 08/23/2019, 09/19/2020, 08/28/2021, 09/12/2022   Fluad Trivalent(High Dose 65+) 09/24/2023   Influenza Split 11/25/2011   Influenza Whole 09/13/2008, 08/31/2010   Influenza, High Dose Seasonal PF 09/17/2016, 09/08/2017, 09/23/2018   Influenza, Seasonal, Injecte, Preservative Fre 11/30/2012   Influenza,inj,Quad PF,6+ Mos 09/06/2013, 09/05/2014, 09/07/2015   Moderna SARS-COV2 Booster Vaccination 01/06/2021   PFIZER(Purple Top)SARS-COV-2 Vaccination 03/07/2020, 04/05/2020   PPD Test 07/12/2018, 07/13/2019, 07/16/2020   Pneumococcal Conjugate-13 12/08/2014   Pneumococcal Polysaccharide-23 03/23/2012, 11/11/2017   Tdap 11/30/2012, 12/04/2022    Screening Tests Health Maintenance  Topic Date Due   Zoster Vaccines- Shingrix (1 of 2) Never done   COVID-19 Vaccine (3 - Pfizer risk series) 02/03/2021   MAMMOGRAM   07/19/2024   Medicare Annual Wellness (AWV)  03/07/2025   Colonoscopy  07/11/2025   DTaP/Tdap/Td (3 - Td or Tdap) 12/04/2032   Pneumonia Vaccine 2+ Years old  Completed   INFLUENZA VACCINE  Completed   DEXA SCAN  Completed   Hepatitis C Screening  Completed   HPV VACCINES  Aged Out    Health Maintenance  Health Maintenance Due  Topic Date Due   Zoster Vaccines- Shingrix (1 of 2) Never done   COVID-19 Vaccine (3 - Pfizer risk series) 02/03/2021   Health Maintenance Items Addressed: See Nurse Notes  Additional Screening:  Vision Screening: Recommended annual ophthalmology exams for early detection of glaucoma and other disorders of the eye.  Dental Screening: Recommended annual dental exams for proper oral hygiene  Community Resource Referral / Chronic Care Management: CRR required this visit?  No   CCM required this visit?  No     Plan:     I have personally reviewed and noted the following in the patient's chart:   Medical and social history Use of alcohol, tobacco or illicit drugs  Current medications and supplements including opioid prescriptions. Patient is not currently taking opioid prescriptions. Functional ability and status Nutritional status Physical activity Advanced directives List of other physicians Hospitalizations, surgeries, and ER visits in previous 12 months Vitals Screenings to include cognitive, depression, and falls Referrals and appointments  In addition, I have reviewed and discussed with patient certain preventive protocols, quality metrics, and best practice recommendations. A written personalized care plan for preventive services as well as general preventive health recommendations were provided to patient.     Osinachi Navarrette L Nicola Quesnell, CMA   03/07/2024   After Visit Summary: (MyChart) Due to this being a telephonic visit, the after visit summary with patients personalized plan was offered to patient via MyChart   Notes: Please refer to  Routing Comments.  Medical screening examination/treatment/procedure(s) were performed by non-physician practitioner and as supervising physician I was immediately available for consultation/collaboration.  I agree with above. Jacinta Shoe, MD

## 2024-03-07 NOTE — Patient Instructions (Signed)
 Samantha [name] , Thank you for taking time to come for your Medicare Wellness Visit. I appreciate your ongoing commitment to your health goals. Please review the following plan we discussed and let me know if I can assist you in the future.   Referrals/Orders/Follow-Ups/Clinician Recommendations: It was nice talking with today.  Keep up the good work.    This is a list of the screening recommended for you and due dates:  Health Maintenance  Topic Date Due   Zoster (Shingles) Vaccine (1 of 2) Never done   COVID-19 Vaccine (3 - Pfizer risk series) 02/03/2021   Mammogram  07/19/2024   Medicare Annual Wellness Visit  03/07/2025   Colon Cancer Screening  07/11/2025   DTaP/Tdap/Td vaccine (3 - Td or Tdap) 12/04/2032   Pneumonia Vaccine  Completed   Flu Shot  Completed   DEXA scan (bone density measurement)  Completed   Hepatitis C Screening  Completed   HPV Vaccine  Aged Out    Advanced directives: (Copy Requested) Please bring a copy of your health care power of attorney and living will to the office to be added to your chart at your convenience. You can mail to [facility] [address] [place], [place] [address] or email to [email or web address]  Next Medicare Annual Wellness Visit scheduled for next year: Yes

## 2024-03-10 ENCOUNTER — Other Ambulatory Visit: Payer: Self-pay | Admitting: Nurse Practitioner

## 2024-03-10 DIAGNOSIS — K7469 Other cirrhosis of liver: Secondary | ICD-10-CM

## 2024-03-10 DIAGNOSIS — C220 Liver cell carcinoma: Secondary | ICD-10-CM

## 2024-03-10 DIAGNOSIS — I10 Essential (primary) hypertension: Secondary | ICD-10-CM | POA: Diagnosis not present

## 2024-04-20 DIAGNOSIS — H25011 Cortical age-related cataract, right eye: Secondary | ICD-10-CM | POA: Diagnosis not present

## 2024-04-20 DIAGNOSIS — H2511 Age-related nuclear cataract, right eye: Secondary | ICD-10-CM | POA: Diagnosis not present

## 2024-04-20 DIAGNOSIS — H25811 Combined forms of age-related cataract, right eye: Secondary | ICD-10-CM | POA: Diagnosis not present

## 2024-04-23 ENCOUNTER — Ambulatory Visit
Admission: RE | Admit: 2024-04-23 | Discharge: 2024-04-23 | Disposition: A | Discharge status: Home / Self Care | Source: Ambulatory Visit | Attending: Nurse Practitioner | Admitting: Nurse Practitioner

## 2024-04-23 DIAGNOSIS — K7689 Other specified diseases of liver: Secondary | ICD-10-CM | POA: Diagnosis not present

## 2024-04-23 DIAGNOSIS — K7469 Other cirrhosis of liver: Secondary | ICD-10-CM

## 2024-04-23 DIAGNOSIS — Z9889 Other specified postprocedural states: Secondary | ICD-10-CM | POA: Diagnosis not present

## 2024-04-23 DIAGNOSIS — C220 Liver cell carcinoma: Secondary | ICD-10-CM

## 2024-04-23 MED ORDER — GADOPICLENOL 0.5 MMOL/ML IV SOLN
10.0000 mL | Freq: Once | INTRAVENOUS | Status: AC | PRN
  Administered 2024-04-23: 10 mL via INTRAVENOUS

## 2024-06-07 ENCOUNTER — Ambulatory Visit (INDEPENDENT_AMBULATORY_CARE_PROVIDER_SITE_OTHER): Payer: Medicare Other | Admitting: Internal Medicine

## 2024-06-07 ENCOUNTER — Encounter: Payer: Self-pay | Admitting: Internal Medicine

## 2024-06-07 VITALS — BP 118/70 | HR 70 | Temp 98.6°F | Ht 60.0 in | Wt 290.0 lb

## 2024-06-07 DIAGNOSIS — I10 Essential (primary) hypertension: Secondary | ICD-10-CM | POA: Diagnosis not present

## 2024-06-07 DIAGNOSIS — M1991 Primary osteoarthritis, unspecified site: Secondary | ICD-10-CM | POA: Diagnosis not present

## 2024-06-07 DIAGNOSIS — K7460 Unspecified cirrhosis of liver: Secondary | ICD-10-CM | POA: Diagnosis not present

## 2024-06-07 DIAGNOSIS — K7581 Nonalcoholic steatohepatitis (NASH): Secondary | ICD-10-CM

## 2024-06-07 DIAGNOSIS — C220 Liver cell carcinoma: Secondary | ICD-10-CM | POA: Diagnosis not present

## 2024-06-07 MED ORDER — LOSARTAN POTASSIUM 100 MG PO TABS: 100.0000 mg | ORAL_TABLET | Freq: Every day | ORAL | 3 refills | Status: AC

## 2024-06-07 MED ORDER — MELOXICAM 7.5 MG PO TABS: ORAL_TABLET | ORAL | 1 refills | Status: DC

## 2024-06-07 MED ORDER — PANTOPRAZOLE SODIUM 40 MG PO TBEC: 40.0000 mg | DELAYED_RELEASE_TABLET | Freq: Every day | ORAL | 3 refills | Status: AC

## 2024-06-07 MED ORDER — POTASSIUM CHLORIDE CRYS ER 20 MEQ PO TBCR: 20.0000 meq | EXTENDED_RELEASE_TABLET | Freq: Every day | ORAL | 3 refills | Status: AC

## 2024-06-07 MED ORDER — CLOTRIMAZOLE-BETAMETHASONE 1-0.05 % EX CREA: 1.0000 | TOPICAL_CREAM | Freq: Every day | CUTANEOUS | 1 refills | Status: AC

## 2024-06-07 MED ORDER — CEPHALEXIN 500 MG PO CAPS: 500.0000 mg | ORAL_CAPSULE | Freq: Four times a day (QID) | ORAL | 1 refills | Status: DC

## 2024-06-07 NOTE — Addendum Note (Signed)
 Addended by: Jaiden Dinkins V on: 06/07/2024 09:13 AM   Modules accepted: Orders

## 2024-06-07 NOTE — Progress Notes (Signed)
 Subjective:  Patient ID: Samantha [name], female    DOB: [birth year]  Age: 73 y.o. MRN: [id number]  CC: Medical Management of Chronic Issues (6 mnth f/u)   HPI Samantha [name] presents for liver cancer, HTN, OA  Outpatient Medications Prior to Visit  Medication Sig Dispense Refill   aspirin  EC 81 MG tablet Take 81 mg by mouth daily.     BLACK COHOSH EXTRACT PO Take 1 tablet by mouth daily.     cephALEXin  (KEFLEX ) 500 MG capsule Take 1 capsule by mouth 4 times daily 12 capsule 0   Cholecalciferol 25 MCG (1000 UT) tablet Take 1,000 Units by mouth daily.      furosemide  (LASIX ) 20 MG tablet TAKE 1 TO 2 TABLETS BY MOUTH ONCE DAILY 180 tablet 3   glucosamine-chondroitin 500-400 MG tablet Take 1 tablet by mouth daily.     latanoprost (XALATAN) 0.005 % ophthalmic solution 1 drop at bedtime.     loratadine  (CLARITIN ) 10 MG tablet Take 1 tablet (10 mg total) by mouth daily. 90 tablet 3   Multiple Vitamin (MULTIVITAMIN WITH MINERALS) TABS tablet Take 1 tablet by mouth daily.      silver  sulfADIAZINE  (SILVADENE ) 1 % cream Apply 1 application topically daily. 100 g 0   trolamine salicylate (ASPERCREME) 10 % cream Apply 1 Application topically as needed for muscle pain.     vitamin C  (ASCORBIC ACID ) 500 MG tablet Take 500 mg by mouth daily.     losartan  (COZAAR ) 100 MG tablet Take 1 tablet (100 mg total) by mouth daily. 90 tablet 3   meloxicam  (MOBIC ) 7.5 MG tablet TAKE 1 TO 2 TABLETS BY MOUTH ONCE DAILY AS NEEDED FOR PAIN 180 tablet 0   pantoprazole  (PROTONIX ) 40 MG tablet Take 1 tablet (40 mg total) by mouth daily. 90 tablet 3   potassium chloride  SA (KLOR-CON  M) 20 MEQ tablet Take 1 tablet (20 mEq total) by mouth daily. 90 tablet 3   No facility-administered medications prior to visit.    ROS: Review of Systems  Constitutional:  Negative for activity change, appetite change, chills, fatigue and unexpected weight change.  HENT:  Negative for congestion, mouth sores and sinus  pressure.   Eyes:  Negative for visual disturbance.  Respiratory:  Negative for cough and chest tightness.   Gastrointestinal:  Negative for abdominal pain and nausea.  Genitourinary:  Negative for difficulty urinating, frequency and vaginal pain.  Musculoskeletal:  Positive for arthralgias and back pain. Negative for gait problem.  Skin:  Negative for pallor and rash.  Neurological:  Negative for dizziness, tremors, weakness, numbness and headaches.  Psychiatric/Behavioral:  Negative for confusion and sleep disturbance.     Objective:  BP 118/70   Pulse 70   Temp 98.6 F (37 C) (Oral)   Ht 5' (1.524 m)   Wt 290 lb (131.5 kg)   LMP  (LMP Unknown)   SpO2 98%   BMI 56.64 kg/m   BP Readings from Last 3 Encounters:  06/07/24 118/70  12/07/23 138/82  06/02/23 118/70    Wt Readings from Last 3 Encounters:  06/07/24 290 lb (131.5 kg)  03/07/24 298 lb (135.2 kg)  12/07/23 298 lb (135.2 kg)    Physical Exam Constitutional:      General: She is not in acute distress.    Appearance: She is well-developed.  HENT:     Head: Normocephalic.     Right Ear: External ear normal.     Left Ear: External ear normal.  Nose: Nose normal.  Eyes:     General:        Right eye: No discharge.        Left eye: No discharge.     Conjunctiva/sclera: Conjunctivae normal.     Pupils: Pupils are equal, round, and reactive to light.  Neck:     Thyroid : No thyromegaly.     Vascular: No JVD.     Trachea: No tracheal deviation.  Cardiovascular:     Rate and Rhythm: Normal rate and regular rhythm.     Heart sounds: Normal heart sounds.  Pulmonary:     Effort: No respiratory distress.     Breath sounds: No stridor. No wheezing.  Abdominal:     General: Bowel sounds are normal. There is no distension.     Palpations: Abdomen is soft. There is no mass.     Tenderness: There is no abdominal tenderness. There is no guarding or rebound.  Musculoskeletal:        General: No tenderness.      Cervical back: Normal range of motion and neck supple. No rigidity.  Lymphadenopathy:     Cervical: No cervical adenopathy.  Skin:    Findings: No erythema or rash.  Neurological:     Mental Status: She is oriented to person, place, and time.     Cranial Nerves: No cranial nerve deficit.     Motor: No abnormal muscle tone.     Coordination: Coordination normal.     Deep Tendon Reflexes: Reflexes normal.  Psychiatric:        Behavior: Behavior normal.        Thought Content: Thought content normal.        Judgment: Judgment normal.     Lab Results  Component Value Date   WBC 3.3 (L) 12/07/2023   HGB 12.6 12/07/2023   HCT 39.4 12/07/2023   PLT 186.0 12/07/2023   GLUCOSE 108 (H) 12/07/2023   CHOL 164 12/07/2023   TRIG 101.0 12/07/2023   HDL 42.70 12/07/2023   LDLCALC 101 (H) 12/07/2023   ALT 11 12/07/2023   AST 17 12/07/2023   NA 140 12/07/2023   K 4.2 12/07/2023   CL 105 12/07/2023   CREATININE 0.90 12/07/2023   BUN 14 12/07/2023   CO2 25 12/07/2023   TSH 2.57 12/07/2023   INR 1.0 10/27/2022   HGBA1C 6.1 12/31/2021    MR ABDOMEN WWO CONTRAST Result Date: 04/24/2024 CLINICAL DATA:  Follow-up chemoembolization and microwave ablation of hepatocellular carcinoma EXAM: MRI ABDOMEN WITHOUT AND WITH CONTRAST TECHNIQUE: Multiplanar multisequence MR imaging of the abdomen was performed both before and after the administration of intravenous contrast. CONTRAST:  10 mL Vueway  gadolinium contrast IV COMPARISON:  09/10/2023 FINDINGS: Lower chest: No acute abnormality. Hepatobiliary: Unchanged, nonenhancing ablation sites of the peripheral liver dome, hepatic segment VII/VIII (series 13, image 43) and anterior liver dome, hepatic segment IV/VIII (series 13, image 60). No new liver lesions or suspicious contrast enhancement. Status post cholecystectomy. No biliary ductal dilatation. Pancreas: Unremarkable. No pancreatic ductal dilatation or surrounding inflammatory changes. Spleen: Normal  in size without significant abnormality. Adrenals/Urinary Tract: Adrenal glands are unremarkable. Kidneys are normal, without renal calculi, solid lesion, or hydronephrosis. Stomach/Bowel: Stomach is within normal limits. No evidence of bowel wall thickening, distention, or inflammatory changes. Vascular/Lymphatic: No significant vascular findings are present. No enlarged abdominal lymph nodes. Other: No abdominal wall hernia or abnormality. No ascites. Musculoskeletal: No acute or significant osseous findings. IMPRESSION: 1. Unchanged, nonenhancing ablation sites of  the peripheral liver dome, hepatic segment VII/VIII and anterior liver dome, hepatic segment IV/VIII. LI-RADS TR, nonviable. 2. No new liver lesions or suspicious contrast enhancement. 3. No evidence of lymphadenopathy or metastatic disease in the abdomen. Electronically Signed   By: Fredricka Jenny M.D.   On: 04/24/2024 13:00    Assessment & Plan:   Problem List Items Addressed This Visit     Essential hypertension - Primary   Chronic Cont on Losartan , Norvasc , Lasix       Relevant Medications   losartan  (COZAAR ) 100 MG tablet   Osteoarthritis   S/p B TKR, L shoulder replacement On Meloxicam  prn  Potential benefits of a long term NSAID  use as well as potential risks  and complications were explained to the patient and were aknowledged.       Relevant Medications   meloxicam  (MOBIC ) 7.5 MG tablet   Hepatocellular carcinoma (HCC)   Since 2016 MRI is due in 07/2024       Liver cirrhosis secondary to NASH (HCC)   F/u w/Dr Pyrtle         Meds ordered this encounter  Medications   losartan  (COZAAR ) 100 MG tablet    Sig: Take 1 tablet (100 mg total) by mouth daily.    Dispense:  90 tablet    Refill:  3   meloxicam  (MOBIC ) 7.5 MG tablet    Sig: TAKE 1 TO 2 TABLETS BY MOUTH ONCE DAILY AS NEEDED FOR PAIN    Dispense:  180 tablet    Refill:  1   pantoprazole  (PROTONIX ) 40 MG tablet    Sig: Take 1 tablet (40 mg total) by  mouth daily.    Dispense:  90 tablet    Refill:  3   potassium chloride  SA (KLOR-CON  M) 20 MEQ tablet    Sig: Take 1 tablet (20 mEq total) by mouth daily.    Dispense:  90 tablet    Refill:  3      Follow-up: No follow-ups on file.  Anitra Barn, MD

## 2024-06-07 NOTE — Assessment & Plan Note (Signed)
Chronic Cont on Losartan, Norvasc, Lasix

## 2024-06-07 NOTE — Assessment & Plan Note (Addendum)
 S/p B TKR, L shoulder replacement On Meloxicam  prn  Potential benefits of a long term NSAID  use as well as potential risks  and complications were explained to the patient and were aknowledged.

## 2024-06-07 NOTE — Assessment & Plan Note (Signed)
 Since 2016 MRI is due in 07/2024

## 2024-06-07 NOTE — Assessment & Plan Note (Signed)
F/u w/Dr Pyrtle 

## 2024-06-20 ENCOUNTER — Other Ambulatory Visit: Payer: Self-pay | Admitting: Internal Medicine

## 2024-06-20 DIAGNOSIS — Z1231 Encounter for screening mammogram for malignant neoplasm of breast: Secondary | ICD-10-CM

## 2024-06-24 ENCOUNTER — Other Ambulatory Visit: Payer: Self-pay | Admitting: Internal Medicine

## 2024-07-13 ENCOUNTER — Other Ambulatory Visit: Payer: Self-pay | Admitting: Internal Medicine

## 2024-07-20 ENCOUNTER — Ambulatory Visit

## 2024-07-20 ENCOUNTER — Ambulatory Visit
Admission: RE | Admit: 2024-07-20 | Discharge: 2024-07-20 | Disposition: A | Discharge status: Home / Self Care | Source: Ambulatory Visit | Attending: Internal Medicine | Admitting: Internal Medicine

## 2024-07-20 DIAGNOSIS — Z1231 Encounter for screening mammogram for malignant neoplasm of breast: Secondary | ICD-10-CM | POA: Diagnosis not present

## 2024-09-15 DIAGNOSIS — K7581 Nonalcoholic steatohepatitis (NASH): Secondary | ICD-10-CM | POA: Diagnosis not present

## 2024-09-15 DIAGNOSIS — K7469 Other cirrhosis of liver: Secondary | ICD-10-CM | POA: Diagnosis not present

## 2024-09-15 DIAGNOSIS — C220 Liver cell carcinoma: Secondary | ICD-10-CM | POA: Diagnosis not present

## 2024-09-16 ENCOUNTER — Other Ambulatory Visit: Payer: Self-pay | Admitting: Nurse Practitioner

## 2024-09-16 DIAGNOSIS — K7469 Other cirrhosis of liver: Secondary | ICD-10-CM

## 2024-09-16 DIAGNOSIS — K7581 Nonalcoholic steatohepatitis (NASH): Secondary | ICD-10-CM

## 2024-09-16 DIAGNOSIS — C220 Liver cell carcinoma: Secondary | ICD-10-CM

## 2024-09-27 DIAGNOSIS — H401131 Primary open-angle glaucoma, bilateral, mild stage: Secondary | ICD-10-CM | POA: Diagnosis not present

## 2024-09-27 DIAGNOSIS — Z961 Presence of intraocular lens: Secondary | ICD-10-CM | POA: Diagnosis not present

## 2024-10-17 ENCOUNTER — Other Ambulatory Visit

## 2024-11-09 ENCOUNTER — Inpatient Hospital Stay
Admission: RE | Admit: 2024-11-09 | Discharge: 2024-11-09 | Disposition: A | Source: Ambulatory Visit | Attending: Nurse Practitioner

## 2024-11-09 DIAGNOSIS — C220 Liver cell carcinoma: Secondary | ICD-10-CM

## 2024-11-09 DIAGNOSIS — K7581 Nonalcoholic steatohepatitis (NASH): Secondary | ICD-10-CM

## 2024-11-09 DIAGNOSIS — K7469 Other cirrhosis of liver: Secondary | ICD-10-CM

## 2024-11-09 MED ORDER — GADOPICLENOL 0.5 MMOL/ML IV SOLN
10.0000 mL | Freq: Once | INTRAVENOUS | Status: AC | PRN
  Administered 2024-11-09: 10 mL via INTRAVENOUS

## 2024-11-11 ENCOUNTER — Other Ambulatory Visit

## 2024-11-14 ENCOUNTER — Ambulatory Visit (HOSPITAL_BASED_OUTPATIENT_CLINIC_OR_DEPARTMENT_OTHER)
Admission: RE | Admit: 2024-11-14 | Discharge: 2024-11-14 | Disposition: A | Discharge status: Home / Self Care | Source: Ambulatory Visit | Attending: Internal Medicine | Admitting: Internal Medicine

## 2024-11-14 DIAGNOSIS — Z780 Asymptomatic menopausal state: Secondary | ICD-10-CM | POA: Insufficient documentation

## 2024-12-01 ENCOUNTER — Other Ambulatory Visit: Payer: Self-pay | Admitting: Internal Medicine

## 2024-12-07 ENCOUNTER — Ambulatory Visit: Admitting: Internal Medicine

## 2024-12-07 ENCOUNTER — Encounter: Payer: Self-pay | Admitting: Internal Medicine

## 2024-12-07 VITALS — BP 140/85 | HR 93 | Temp 97.6°F | Ht 60.0 in | Wt 294.6 lb

## 2024-12-07 DIAGNOSIS — Z0000 Encounter for general adult medical examination without abnormal findings: Secondary | ICD-10-CM

## 2024-12-07 DIAGNOSIS — C220 Liver cell carcinoma: Secondary | ICD-10-CM

## 2024-12-07 DIAGNOSIS — Z6841 Body Mass Index (BMI) 40.0 and over, adult: Secondary | ICD-10-CM

## 2024-12-07 DIAGNOSIS — I10 Essential (primary) hypertension: Secondary | ICD-10-CM | POA: Diagnosis not present

## 2024-12-07 NOTE — Assessment & Plan Note (Addendum)
 Since 2016 Seeing Samantha Hays, NP w/Dr Arvell CT chest is pending - (MRI: Mild increase in size of 11 mm lymph node in the left cardiophrenic angle. Although this may be reactive in etiology, lymph node metastasis cannot be excluded)

## 2024-12-07 NOTE — Assessment & Plan Note (Addendum)
 Check BP at home, nl BP at home

## 2024-12-07 NOTE — Assessment & Plan Note (Signed)
 Discussed.

## 2024-12-07 NOTE — Assessment & Plan Note (Signed)
 We discussed age appropriate health related issues, including available/recomended screening tests and vaccinations. Labs were ordered to be later reviewed . All questions were answered. We discussed one or more of the following - seat belt use, use of sunscreen/sun exposure exercise, fall risk reduction, second hand smoke exposure, firearm use and storage, seat belt use, a need for adhering to healthy diet and exercise. Labs were ordered.  All questions were answered. Labs w/Onc. Lipids yearly - a hard stick

## 2024-12-07 NOTE — Progress Notes (Signed)
 Subjective:  Patient ID: Samantha [name], female    DOB: [birth year]  Age: 73 y.o. MRN: [id number]  CC: Annual Exam (Annual Exam)   HPI Samantha [name] presents for a well exam Here w/her boyfriend Toribio  Outpatient Medications Prior to Visit  Medication Sig Dispense Refill   aspirin  EC 81 MG tablet Take 81 mg by mouth daily.     BLACK COHOSH EXTRACT PO Take 1 tablet by mouth daily.     cephALEXin  (KEFLEX ) 500 MG capsule Take 1 capsule (500 mg total) by mouth 4 (four) times daily. 16 capsule 1   Cholecalciferol 25 MCG (1000 UT) tablet Take 1,000 Units by mouth daily.      clotrimazole -betamethasone  (LOTRISONE ) cream Apply 1 Application topically daily. 90 g 1   furosemide  (LASIX ) 20 MG tablet TAKE 1 TO 2 TABLETS BY MOUTH ONCE DAILY 180 tablet 1   glucosamine-chondroitin 500-400 MG tablet Take 1 tablet by mouth daily.     latanoprost (XALATAN) 0.005 % ophthalmic solution 1 drop at bedtime.     loratadine  (CLARITIN ) 10 MG tablet Take 1 tablet by mouth once daily 90 tablet 3   losartan  (COZAAR ) 100 MG tablet Take 1 tablet (100 mg total) by mouth daily. 90 tablet 3   meloxicam  (MOBIC ) 7.5 MG tablet TAKE 1 TO 2 TABLETS BY MOUTH ONCE DAILY AS NEEDED FOR PAIN 180 tablet 0   Multiple Vitamin (MULTIVITAMIN WITH MINERALS) TABS tablet Take 1 tablet by mouth daily.      pantoprazole  (PROTONIX ) 40 MG tablet Take 1 tablet (40 mg total) by mouth daily. 90 tablet 3   potassium chloride  SA (KLOR-CON  M) 20 MEQ tablet Take 1 tablet (20 mEq total) by mouth daily. 90 tablet 3   silver  sulfADIAZINE  (SILVADENE ) 1 % cream Apply 1 application topically daily. 100 g 0   trolamine salicylate (ASPERCREME) 10 % cream Apply 1 Application topically as needed for muscle pain.     vitamin C  (ASCORBIC ACID ) 500 MG tablet Take 500 mg by mouth daily.     No facility-administered medications prior to visit.    ROS: Review of Systems  Constitutional:  Negative for activity change, appetite change, chills,  fatigue and unexpected weight change.  HENT:  Negative for congestion, mouth sores and sinus pressure.   Eyes:  Negative for visual disturbance.  Respiratory:  Negative for cough and chest tightness.   Gastrointestinal:  Negative for abdominal pain and nausea.  Genitourinary:  Negative for difficulty urinating, frequency and vaginal pain.  Musculoskeletal:  Negative for back pain and gait problem.  Skin:  Negative for pallor and rash.  Neurological:  Negative for dizziness, tremors, weakness, numbness and headaches.  Psychiatric/Behavioral:  Negative for confusion and sleep disturbance.     Objective:  BP (!) 140/85   Pulse 93   Temp 97.6 F (36.4 C)   Ht 5' (1.524 m)   Wt 294 lb 9.6 oz (133.6 kg)   LMP  (LMP Unknown)   SpO2 99%   BMI 57.54 kg/m   BP Readings from Last 3 Encounters:  12/07/24 (!) 140/85  06/07/24 118/70  12/07/23 138/82    Wt Readings from Last 3 Encounters:  12/07/24 294 lb 9.6 oz (133.6 kg)  06/07/24 290 lb (131.5 kg)  03/07/24 298 lb (135.2 kg)    Physical Exam Constitutional:      General: She is not in acute distress.    Appearance: She is well-developed.  HENT:     Head: Normocephalic.  Right Ear: External ear normal.     Left Ear: External ear normal.     Nose: Nose normal.  Eyes:     General:        Right eye: No discharge.        Left eye: No discharge.     Conjunctiva/sclera: Conjunctivae normal.     Pupils: Pupils are equal, round, and reactive to light.  Neck:     Thyroid : No thyromegaly.     Vascular: No JVD.     Trachea: No tracheal deviation.  Cardiovascular:     Rate and Rhythm: Normal rate and regular rhythm.     Heart sounds: Normal heart sounds.  Pulmonary:     Effort: No respiratory distress.     Breath sounds: No stridor. No wheezing.  Abdominal:     General: Bowel sounds are normal. There is no distension.     Palpations: Abdomen is soft. There is no mass.     Tenderness: There is no abdominal tenderness. There  is no guarding or rebound.  Musculoskeletal:        General: No tenderness.     Cervical back: Normal range of motion and neck supple. No rigidity.  Lymphadenopathy:     Cervical: No cervical adenopathy.  Skin:    Findings: No erythema or rash.  Neurological:     Cranial Nerves: No cranial nerve deficit.     Motor: No abnormal muscle tone.     Coordination: Coordination normal.     Deep Tendon Reflexes: Reflexes normal.  Psychiatric:        Behavior: Behavior normal.        Thought Content: Thought content normal.        Judgment: Judgment normal.     Lab Results  Component Value Date   WBC 3.3 (L) 12/07/2023   HGB 12.6 12/07/2023   HCT 39.4 12/07/2023   PLT 186.0 12/07/2023   GLUCOSE 108 (H) 12/07/2023   CHOL 164 12/07/2023   TRIG 101.0 12/07/2023   HDL 42.70 12/07/2023   LDLCALC 101 (H) 12/07/2023   ALT 11 12/07/2023   AST 17 12/07/2023   NA 140 12/07/2023   K 4.2 12/07/2023   CL 105 12/07/2023   CREATININE 0.90 12/07/2023   BUN 14 12/07/2023   CO2 25 12/07/2023   TSH 2.57 12/07/2023   INR 1.0 10/27/2022   HGBA1C 6.1 12/31/2021    DG Bone Density Result Date: 11/15/2024 EXAM: DUAL X-RAY ABSORPTIOMETRY (DXA) FOR BONE MINERAL DENSITY 11/14/2024 11:38 am CLINICAL DATA:  73 year old Female Postmenopausal. Postmenopausal TECHNIQUE: An axial (e.g., hips, spine) and/or appendicular (e.g., radius) exam was performed, as appropriate, using [employer] Secretary/administrator at [employer]. Images are obtained for bone mineral density measurement and are not obtained for diagnostic purposes. [id number] Exclusions: Lumbar spine due to degenerative change. COMPARISON:  None. New baseline. FINDINGS: Scan quality: Good. LEFT FEMORAL NECK: BMD (in g/cm2): 1.324 T-score: 2.1 Z-score: 2.3 LEFT TOTAL HIP: BMD (in g/cm2): 1.463 T-score: 3.6 Z-score: 3.5 RIGHT FEMORAL NECK: BMD (in g/cm2): 1.402 T-score: 2.6 Z-score: 2.9 RIGHT TOTAL HIP: BMD (in g/cm2): 1.383 T-score: 3.0  Z-score: 2.9 LEFT FOREARM (RADIUS 33%): BMD (in g/cm2): 1.044 T-score: 1.8 Z-score: 3.3 FRAX 10-YEAR PROBABILITY OF FRACTURE: FRAX not reported as the lowest BMD is not in the osteopenia range. IMPRESSION: Normal based on BMD. Fracture risk is not increased. RECOMMENDATIONS: 1. All patients should optimize calcium and vitamin D intake. 2. Consider FDA-approved medical therapies  in postmenopausal women and men aged 17 years and older, based on the following: - A hip or vertebral (clinical or morphometric) fracture - T-score less than or equal to -2.5 and secondary causes have been excluded. - Low bone mass (T-score between -1.0 and -2.5) and a 10-year probability of a hip fracture greater than or equal to 3% or a 10-year probability of a major osteoporosis-related fracture greater than or equal to 20% based on the [place] -adapted WHO algorithm. - Clinician judgment and/or patient preferences may indicate treatment for people with 10-year fracture probabilities above or below these levels 3. Patients with diagnosis of osteoporosis or at high risk for fracture should have regular bone mineral density tests. For patients eligible for Medicare, routine testing is allowed once every 2 years. The testing frequency can be increased to one year for patients who have rapidly progressing disease, those who are receiving or discontinuing medical therapy to restore bone mass, or have additional risk factors. Electronically Signed   By: Harrietta Sherry M.D.   On: 11/15/2024 08:52    Assessment & Plan:   Problem List Items Addressed This Visit     Essential hypertension   Check BP at home, nl BP at home       Hepatocellular carcinoma [facility])   Since 2016 Seeing Ms Hays, NP w/Dr Arvell CT chest is pending - (MRI: Mild increase in size of 11 mm lymph node in the left cardiophrenic angle. Although this may be reactive in etiology, lymph node metastasis cannot be excluded)       OBESITY, MORBID    Discussed       Well adult exam - Primary   We discussed age appropriate health related issues, including available/recomended screening tests and vaccinations. Labs were ordered to be later reviewed . All questions were answered. We discussed one or more of the following - seat belt use, use of sunscreen/sun exposure exercise, fall risk reduction, second hand smoke exposure, firearm use and storage, seat belt use, a need for adhering to healthy diet and exercise. Labs were ordered.  All questions were answered. Labs w/Onc. Lipids yearly - a hard stick         No orders of the defined types were placed in this encounter.     Follow-up: Return in about 6 months (around 06/07/2025) for a follow-up visit.  Marolyn Noel, MD

## 2024-12-08 ENCOUNTER — Other Ambulatory Visit: Payer: Self-pay | Admitting: Nurse Practitioner

## 2024-12-08 DIAGNOSIS — R599 Enlarged lymph nodes, unspecified: Secondary | ICD-10-CM

## 2024-12-08 DIAGNOSIS — C220 Liver cell carcinoma: Secondary | ICD-10-CM

## 2024-12-14 ENCOUNTER — Ambulatory Visit
Admission: RE | Admit: 2024-12-14 | Discharge: 2024-12-14 | Disposition: A | Discharge status: Home / Self Care | Source: Ambulatory Visit | Attending: Nurse Practitioner | Admitting: Nurse Practitioner

## 2024-12-14 DIAGNOSIS — R599 Enlarged lymph nodes, unspecified: Secondary | ICD-10-CM

## 2024-12-14 DIAGNOSIS — C220 Liver cell carcinoma: Secondary | ICD-10-CM

## 2024-12-27 ENCOUNTER — Ambulatory Visit
Admission: RE | Admit: 2024-12-27 | Discharge: 2024-12-27 | Disposition: A | Discharge status: Home / Self Care | Source: Ambulatory Visit | Attending: Nurse Practitioner | Admitting: Nurse Practitioner

## 2024-12-27 MED ORDER — IOPAMIDOL (ISOVUE-300) INJECTION 61%
75.0000 mL | Freq: Once | INTRAVENOUS | Status: AC | PRN
  Administered 2024-12-27: 75 mL via INTRAVENOUS

## 2025-01-16 ENCOUNTER — Other Ambulatory Visit (HOSPITAL_COMMUNITY): Payer: Self-pay | Admitting: Nurse Practitioner

## 2025-01-16 DIAGNOSIS — C220 Liver cell carcinoma: Secondary | ICD-10-CM

## 2025-01-16 DIAGNOSIS — R590 Localized enlarged lymph nodes: Secondary | ICD-10-CM

## 2025-01-16 NOTE — Progress Notes (Unsigned)
 Hughes Simmonds, MD  B[facility] Channing CROME Cc: Xayasine, Melissa; Rigney, Taryn F, RT PROCEDURE / BIOPSY REVIEW Date: 01/16/25  Requested Biopsy site: R juxta-diaphragmatic mass, Q LN Reason for request: Enlarging. Hx HCC Imaging review: Best seen on CT chest  Decision: Approved Imaging modality to perform: CT Schedule with: Moderate Sedation Schedule for: Any VIR  Additional comments: @VIR : Supradiaphragmatic mass, at R pericardial fat pad @Schedulers . CT R chest / diaphragmatic mass Bx. Mod Sed  Please contact me with questions, concerns, or if issue pertaining to this request arise.  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 KNow history of HCC. Dx: Lymphadenopathy, abdominal [R59.0 (ICD-10-CM)]; Hepatocellular carcinoma (HCC) [C22.0 (ICD-10-CM)    History :CT CHEST W CONTRAST,DG Bone Density,MR ABDOMEN WWO CONTRAST,MM 3D SCREENING MAMMOGRAM BILATERAL BREAST  Provider:Drazek, Dawn, CRNP  Provider contact :[phone]

## 2025-01-21 ENCOUNTER — Other Ambulatory Visit: Payer: Self-pay | Admitting: Internal Medicine

## 2025-01-22 ENCOUNTER — Other Ambulatory Visit: Payer: Self-pay | Admitting: Radiology

## 2025-01-22 DIAGNOSIS — R591 Generalized enlarged lymph nodes: Secondary | ICD-10-CM

## 2025-01-25 ENCOUNTER — Ambulatory Visit (HOSPITAL_COMMUNITY)

## 2025-02-08 ENCOUNTER — Ambulatory Visit (HOSPITAL_COMMUNITY)

## 2025-02-15 ENCOUNTER — Ambulatory Visit (HOSPITAL_BASED_OUTPATIENT_CLINIC_OR_DEPARTMENT_OTHER): Admitting: Pulmonary Disease

## 2025-03-08 ENCOUNTER — Ambulatory Visit

## 2025-06-07 ENCOUNTER — Ambulatory Visit: Admitting: Internal Medicine
